# Patient Record
Sex: Female | Born: 1935 | Race: White | Hispanic: No | Smoking: Never smoker
Health system: Southern US, Community
[De-identification: ages and names within clinical notes are randomized; demographics above are authoritative.]

## PROBLEM LIST (undated history)

## (undated) DIAGNOSIS — I2510 Atherosclerotic heart disease of native coronary artery without angina pectoris: Secondary | ICD-10-CM

## (undated) DIAGNOSIS — M1990 Unspecified osteoarthritis, unspecified site: Secondary | ICD-10-CM

## (undated) DIAGNOSIS — I255 Ischemic cardiomyopathy: Secondary | ICD-10-CM

## (undated) DIAGNOSIS — I480 Paroxysmal atrial fibrillation: Secondary | ICD-10-CM

## (undated) DIAGNOSIS — M25519 Pain in unspecified shoulder: Secondary | ICD-10-CM

## (undated) DIAGNOSIS — I5020 Unspecified systolic (congestive) heart failure: Secondary | ICD-10-CM

## (undated) DIAGNOSIS — G8929 Other chronic pain: Secondary | ICD-10-CM

## (undated) DIAGNOSIS — I739 Peripheral vascular disease, unspecified: Secondary | ICD-10-CM

## (undated) DIAGNOSIS — I080 Rheumatic disorders of both mitral and aortic valves: Secondary | ICD-10-CM

## (undated) DIAGNOSIS — E785 Hyperlipidemia, unspecified: Secondary | ICD-10-CM

## (undated) DIAGNOSIS — G629 Polyneuropathy, unspecified: Secondary | ICD-10-CM

## (undated) DIAGNOSIS — S329XXA Fracture of unspecified parts of lumbosacral spine and pelvis, initial encounter for closed fracture: Secondary | ICD-10-CM

## (undated) DIAGNOSIS — I6529 Occlusion and stenosis of unspecified carotid artery: Secondary | ICD-10-CM

## (undated) DIAGNOSIS — I10 Essential (primary) hypertension: Secondary | ICD-10-CM

## (undated) DIAGNOSIS — M549 Dorsalgia, unspecified: Secondary | ICD-10-CM

## (undated) HISTORY — DX: Unspecified systolic (congestive) heart failure: I50.20

## (undated) HISTORY — DX: Peripheral vascular disease, unspecified: I73.9

## (undated) HISTORY — DX: Essential (primary) hypertension: I10

## (undated) HISTORY — DX: Unspecified osteoarthritis, unspecified site: M19.90

## (undated) HISTORY — DX: Occlusion and stenosis of unspecified carotid artery: I65.29

## (undated) HISTORY — PX: ABDOMINAL HYSTERECTOMY: SHX81

## (undated) HISTORY — PX: BREAST BIOPSY: SHX20

## (undated) HISTORY — DX: Other chronic pain: G89.29

## (undated) HISTORY — DX: Rheumatic disorders of both mitral and aortic valves: I08.0

## (undated) HISTORY — DX: Polyneuropathy, unspecified: G62.9

## (undated) HISTORY — DX: Dorsalgia, unspecified: M54.9

## (undated) HISTORY — DX: Atherosclerotic heart disease of native coronary artery without angina pectoris: I25.10

## (undated) HISTORY — DX: Paroxysmal atrial fibrillation: I48.0

## (undated) HISTORY — DX: Hyperlipidemia, unspecified: E78.5

## (undated) HISTORY — PX: OTHER SURGICAL HISTORY: SHX169

## (undated) HISTORY — PX: VESICOVAGINAL FISTULA CLOSURE W/ TAH: SUR271

## (undated) HISTORY — DX: Ischemic cardiomyopathy: I25.5

## (undated) HISTORY — DX: Fracture of unspecified parts of lumbosacral spine and pelvis, initial encounter for closed fracture: S32.9XXA

## (undated) HISTORY — DX: Pain in unspecified shoulder: M25.519

## (undated) SURGERY — Surgical Case
Anesthesia: *Unknown

---

## 1998-03-04 ENCOUNTER — Ambulatory Visit (HOSPITAL_COMMUNITY): Admission: RE | Admit: 1998-03-04 | Discharge: 1998-03-04 | Payer: Self-pay | Admitting: Obstetrics & Gynecology

## 2000-12-18 HISTORY — PX: CORONARY STENT PLACEMENT: SHX1402

## 2002-01-18 LAB — HM MAMMOGRAPHY: HM Mammogram: NORMAL

## 2002-12-18 HISTORY — PX: CORONARY ANGIOPLASTY: SHX604

## 2003-04-29 ENCOUNTER — Encounter: Payer: Self-pay | Admitting: Gastroenterology

## 2004-10-27 ENCOUNTER — Ambulatory Visit: Payer: Self-pay | Admitting: Internal Medicine

## 2004-11-17 HISTORY — PX: NM MYOVIEW LTD: HXRAD82

## 2005-02-24 ENCOUNTER — Ambulatory Visit: Payer: Self-pay | Admitting: Internal Medicine

## 2005-03-07 ENCOUNTER — Ambulatory Visit: Payer: Self-pay | Admitting: Internal Medicine

## 2005-04-11 ENCOUNTER — Ambulatory Visit: Payer: Self-pay | Admitting: Internal Medicine

## 2005-04-27 ENCOUNTER — Ambulatory Visit: Payer: Self-pay | Admitting: Internal Medicine

## 2005-06-06 ENCOUNTER — Ambulatory Visit: Payer: Self-pay | Admitting: Internal Medicine

## 2005-06-29 ENCOUNTER — Ambulatory Visit: Payer: Self-pay | Admitting: Internal Medicine

## 2005-07-26 ENCOUNTER — Ambulatory Visit: Payer: Self-pay | Admitting: Internal Medicine

## 2005-09-17 HISTORY — PX: OTHER SURGICAL HISTORY: SHX169

## 2005-09-24 ENCOUNTER — Other Ambulatory Visit: Payer: Self-pay

## 2005-09-24 ENCOUNTER — Observation Stay: Payer: Self-pay

## 2005-10-05 ENCOUNTER — Ambulatory Visit: Payer: Self-pay | Admitting: Internal Medicine

## 2005-10-26 ENCOUNTER — Ambulatory Visit: Payer: Self-pay | Admitting: Internal Medicine

## 2005-11-10 ENCOUNTER — Ambulatory Visit: Payer: Self-pay | Admitting: Family Medicine

## 2005-11-13 ENCOUNTER — Ambulatory Visit: Payer: Self-pay | Admitting: Internal Medicine

## 2005-12-05 ENCOUNTER — Ambulatory Visit: Payer: Self-pay | Admitting: Internal Medicine

## 2006-01-15 ENCOUNTER — Ambulatory Visit: Payer: Self-pay | Admitting: Internal Medicine

## 2006-01-18 HISTORY — PX: CORONARY ANGIOPLASTY: SHX604

## 2006-01-18 HISTORY — PX: OTHER SURGICAL HISTORY: SHX169

## 2006-01-23 ENCOUNTER — Ambulatory Visit: Payer: Self-pay | Admitting: Internal Medicine

## 2006-01-26 ENCOUNTER — Inpatient Hospital Stay (HOSPITAL_BASED_OUTPATIENT_CLINIC_OR_DEPARTMENT_OTHER): Admission: RE | Admit: 2006-01-26 | Discharge: 2006-01-26 | Payer: Self-pay | Admitting: *Deleted

## 2006-01-26 ENCOUNTER — Ambulatory Visit: Payer: Self-pay | Admitting: *Deleted

## 2006-01-31 ENCOUNTER — Ambulatory Visit: Payer: Self-pay

## 2006-02-12 ENCOUNTER — Ambulatory Visit: Payer: Self-pay | Admitting: Internal Medicine

## 2006-04-05 ENCOUNTER — Ambulatory Visit: Payer: Self-pay | Admitting: Internal Medicine

## 2006-04-09 ENCOUNTER — Ambulatory Visit: Payer: Self-pay | Admitting: Internal Medicine

## 2006-04-16 ENCOUNTER — Ambulatory Visit: Payer: Self-pay | Admitting: Internal Medicine

## 2006-06-14 ENCOUNTER — Ambulatory Visit: Payer: Self-pay | Admitting: Internal Medicine

## 2006-06-21 ENCOUNTER — Ambulatory Visit: Payer: Self-pay | Admitting: Internal Medicine

## 2006-08-14 ENCOUNTER — Ambulatory Visit: Payer: Self-pay | Admitting: Internal Medicine

## 2006-09-17 ENCOUNTER — Ambulatory Visit: Payer: Self-pay | Admitting: Internal Medicine

## 2006-09-17 HISTORY — PX: CATARACT EXTRACTION: SUR2

## 2006-09-18 ENCOUNTER — Ambulatory Visit: Payer: Self-pay | Admitting: Ophthalmology

## 2006-09-24 ENCOUNTER — Ambulatory Visit: Payer: Self-pay | Admitting: Ophthalmology

## 2006-10-09 ENCOUNTER — Ambulatory Visit: Payer: Self-pay | Admitting: Internal Medicine

## 2006-10-18 HISTORY — PX: OTHER SURGICAL HISTORY: SHX169

## 2006-10-23 ENCOUNTER — Ambulatory Visit: Payer: Self-pay | Admitting: Internal Medicine

## 2006-11-12 ENCOUNTER — Encounter: Payer: Self-pay | Admitting: Cardiology

## 2006-11-12 ENCOUNTER — Ambulatory Visit: Payer: Self-pay

## 2006-12-04 ENCOUNTER — Ambulatory Visit: Payer: Self-pay | Admitting: Internal Medicine

## 2007-01-10 ENCOUNTER — Ambulatory Visit: Payer: Self-pay | Admitting: Internal Medicine

## 2007-02-14 ENCOUNTER — Ambulatory Visit: Payer: Self-pay | Admitting: Internal Medicine

## 2007-02-25 ENCOUNTER — Ambulatory Visit: Payer: Self-pay | Admitting: Internal Medicine

## 2007-02-25 LAB — CONVERTED CEMR LAB
ALT: 15 units/L (ref 0–40)
AST: 18 units/L (ref 0–37)
Bilirubin, Direct: 0.1 mg/dL (ref 0.0–0.3)
Chloride: 107 meq/L (ref 96–112)
Cholesterol: 178 mg/dL (ref 0–200)
GFR calc Af Amer: 88 mL/min
GFR calc non Af Amer: 73 mL/min
Phosphorus: 3.7 mg/dL (ref 2.3–4.6)
Potassium: 4.2 meq/L (ref 3.5–5.1)
Sodium: 144 meq/L (ref 135–145)
Total Bilirubin: 0.7 mg/dL (ref 0.3–1.2)
Total CHOL/HDL Ratio: 4.1
Total Protein: 6.5 g/dL (ref 6.0–8.3)

## 2007-03-19 HISTORY — PX: CORONARY ANGIOPLASTY: SHX604

## 2007-04-05 ENCOUNTER — Ambulatory Visit: Payer: Self-pay | Admitting: Internal Medicine

## 2007-04-16 ENCOUNTER — Ambulatory Visit: Payer: Self-pay | Admitting: Internal Medicine

## 2007-04-18 HISTORY — PX: CORONARY STENT PLACEMENT: SHX1402

## 2007-04-22 ENCOUNTER — Ambulatory Visit: Payer: Self-pay | Admitting: Cardiovascular Disease

## 2007-04-22 ENCOUNTER — Inpatient Hospital Stay (HOSPITAL_COMMUNITY): Admission: AD | Admit: 2007-04-22 | Discharge: 2007-04-23 | Payer: Self-pay | Admitting: Cardiovascular Disease

## 2007-04-26 ENCOUNTER — Ambulatory Visit: Payer: Self-pay | Admitting: Internal Medicine

## 2007-05-19 HISTORY — PX: FEMORAL HERNIA REPAIR: SUR1179

## 2007-05-30 ENCOUNTER — Ambulatory Visit: Payer: Self-pay | Admitting: Internal Medicine

## 2007-06-04 DIAGNOSIS — I25119 Atherosclerotic heart disease of native coronary artery with unspecified angina pectoris: Secondary | ICD-10-CM

## 2007-06-04 DIAGNOSIS — I10 Essential (primary) hypertension: Secondary | ICD-10-CM | POA: Insufficient documentation

## 2007-06-04 DIAGNOSIS — I739 Peripheral vascular disease, unspecified: Secondary | ICD-10-CM

## 2007-06-04 DIAGNOSIS — M810 Age-related osteoporosis without current pathological fracture: Secondary | ICD-10-CM

## 2007-06-05 ENCOUNTER — Ambulatory Visit: Payer: Self-pay | Admitting: Internal Medicine

## 2007-06-05 LAB — CONVERTED CEMR LAB
Nitrite: NEGATIVE
Specific Gravity, Urine: <1.005
pH: 5

## 2007-06-06 ENCOUNTER — Other Ambulatory Visit: Payer: Self-pay

## 2007-06-06 ENCOUNTER — Ambulatory Visit: Payer: Self-pay | Admitting: Cardiology

## 2007-06-06 ENCOUNTER — Inpatient Hospital Stay: Payer: Self-pay | Admitting: Vascular Surgery

## 2007-06-07 ENCOUNTER — Telehealth (INDEPENDENT_AMBULATORY_CARE_PROVIDER_SITE_OTHER): Payer: Self-pay | Admitting: Internal Medicine

## 2007-06-07 ENCOUNTER — Other Ambulatory Visit: Payer: Self-pay

## 2007-06-10 ENCOUNTER — Encounter: Payer: Self-pay | Admitting: Internal Medicine

## 2007-06-19 ENCOUNTER — Ambulatory Visit: Payer: Self-pay | Admitting: Internal Medicine

## 2007-06-25 ENCOUNTER — Encounter: Payer: Self-pay | Admitting: Internal Medicine

## 2007-06-26 ENCOUNTER — Ambulatory Visit: Payer: Self-pay | Admitting: Vascular Surgery

## 2007-08-01 ENCOUNTER — Ambulatory Visit: Payer: Self-pay | Admitting: Internal Medicine

## 2007-08-22 ENCOUNTER — Ambulatory Visit: Payer: Self-pay | Admitting: Internal Medicine

## 2007-11-05 ENCOUNTER — Ambulatory Visit: Payer: Self-pay | Admitting: Internal Medicine

## 2007-11-22 ENCOUNTER — Ambulatory Visit: Payer: Self-pay | Admitting: Internal Medicine

## 2007-11-29 ENCOUNTER — Ambulatory Visit: Payer: Self-pay | Admitting: Internal Medicine

## 2007-11-29 LAB — CONVERTED CEMR LAB
BUN: 28 mg/dL — ABNORMAL HIGH (ref 6–23)
Calcium: 8.7 mg/dL (ref 8.4–10.5)
Glucose, Bld: 104 mg/dL — ABNORMAL HIGH (ref 70–99)

## 2007-12-04 ENCOUNTER — Ambulatory Visit: Payer: Self-pay | Admitting: Internal Medicine

## 2007-12-04 DIAGNOSIS — E785 Hyperlipidemia, unspecified: Secondary | ICD-10-CM

## 2007-12-05 LAB — CONVERTED CEMR LAB
AST: 19 units/L (ref 0–37)
BUN: 21 mg/dL (ref 6–23)
Basophils Absolute: 0.1 10*3/uL (ref 0.0–0.1)
Bilirubin, Direct: 0.2 mg/dL (ref 0.0–0.3)
Cholesterol: 133 mg/dL (ref 0–200)
Creatinine, Ser: 0.9 mg/dL (ref 0.4–1.2)
Eosinophils Relative: 2.4 % (ref 0.0–5.0)
GFR calc Af Amer: 77 mL/min
GFR calc non Af Amer: 63 mL/min
HCT: 37.1 % (ref 36.0–46.0)
Hemoglobin: 12.9 g/dL (ref 12.0–15.0)
LDL Cholesterol: 51 mg/dL (ref 0–99)
MCHC: 34.7 g/dL (ref 30.0–36.0)
MCV: 93.3 fL (ref 78.0–100.0)
Monocytes Absolute: 0.9 10*3/uL — ABNORMAL HIGH (ref 0.2–0.7)
Neutrophils Relative %: 65.2 % (ref 43.0–77.0)
Phosphorus: 4.4 mg/dL (ref 2.3–4.6)
Potassium: 4.6 meq/L (ref 3.5–5.1)
RDW: 13 % (ref 11.5–14.6)
Sodium: 145 meq/L (ref 135–145)
TSH: 2.07 microintl units/mL (ref 0.35–5.50)
Total Protein: 7.1 g/dL (ref 6.0–8.3)
WBC: 8.8 10*3/uL (ref 4.5–10.5)

## 2007-12-17 ENCOUNTER — Telehealth (INDEPENDENT_AMBULATORY_CARE_PROVIDER_SITE_OTHER): Payer: Self-pay | Admitting: *Deleted

## 2008-03-20 ENCOUNTER — Ambulatory Visit: Payer: Self-pay | Admitting: Internal Medicine

## 2008-05-18 ENCOUNTER — Ambulatory Visit: Payer: Self-pay | Admitting: Internal Medicine

## 2008-05-19 LAB — CONVERTED CEMR LAB
ALT: 15 units/L (ref 0–35)
Albumin: 3.9 g/dL (ref 3.5–5.2)
BUN: 27 mg/dL — ABNORMAL HIGH (ref 6–23)
Basophils Relative: 0.8 % (ref 0.0–1.0)
Bilirubin, Direct: 0.1 mg/dL (ref 0.0–0.3)
Chloride: 109 meq/L (ref 96–112)
Creatinine, Ser: 1 mg/dL (ref 0.4–1.2)
Eosinophils Absolute: 0.2 10*3/uL (ref 0.0–0.7)
Eosinophils Relative: 2.9 % (ref 0.0–5.0)
GFR calc Af Amer: 68 mL/min
GFR calc non Af Amer: 56 mL/min
HCT: 37.2 % (ref 36.0–46.0)
HDL: 43.3 mg/dL (ref >39.0)
Hemoglobin: 12.8 g/dL (ref 12.0–15.0)
MCV: 93 fL (ref 78.0–100.0)
Monocytes Absolute: 0.5 10*3/uL (ref 0.1–1.0)
Monocytes Relative: 8.4 % (ref 3.0–12.0)
Neutro Abs: 4.2 10*3/uL (ref 1.4–7.7)
Phosphorus: 3.3 mg/dL (ref 2.3–4.6)
Potassium: 4.5 meq/L (ref 3.5–5.1)
RBC: 4 M/uL (ref 3.87–5.11)
Total CHOL/HDL Ratio: 3.8
Total Protein: 7.6 g/dL (ref 6.0–8.3)
WBC: 6.5 10*3/uL (ref 4.5–10.5)

## 2008-07-24 ENCOUNTER — Ambulatory Visit: Payer: Self-pay | Admitting: Internal Medicine

## 2008-07-24 LAB — CONVERTED CEMR LAB
BUN: 29 mg/dL — ABNORMAL HIGH (ref 6–23)
Calcium: 9.2 mg/dL (ref 8.4–10.5)
Glucose, Bld: 101 mg/dL — ABNORMAL HIGH (ref 70–99)
Potassium: 4.1 meq/L (ref 3.5–5.3)
Sodium: 144 meq/L (ref 135–145)
TSH: 2.249 microintl units/mL (ref 0.350–4.50)
Vitamin B-12: 424 pg/mL (ref 211–911)

## 2008-07-29 ENCOUNTER — Ambulatory Visit: Payer: Self-pay

## 2008-07-29 ENCOUNTER — Encounter: Payer: Self-pay | Admitting: Internal Medicine

## 2008-08-27 ENCOUNTER — Ambulatory Visit: Payer: Self-pay | Admitting: Internal Medicine

## 2008-09-15 ENCOUNTER — Telehealth: Payer: Self-pay | Admitting: Internal Medicine

## 2008-11-09 ENCOUNTER — Ambulatory Visit: Payer: Self-pay | Admitting: Internal Medicine

## 2008-11-18 ENCOUNTER — Ambulatory Visit: Payer: Self-pay | Admitting: Internal Medicine

## 2008-11-20 LAB — CONVERTED CEMR LAB
ALT: 17 units/L (ref 0–35)
AST: 18 units/L (ref 0–37)
Albumin: 3.6 g/dL (ref 3.5–5.2)
Basophils Relative: 1 % (ref 0.0–3.0)
Calcium: 9.2 mg/dL (ref 8.4–10.5)
Eosinophils Relative: 2.4 % (ref 0.0–5.0)
GFR calc Af Amer: 68 mL/min
GFR calc non Af Amer: 56 mL/min
Glucose, Bld: 98 mg/dL (ref 70–99)
HCT: 34.9 % — ABNORMAL LOW (ref 36.0–46.0)
Hemoglobin: 11.9 g/dL — ABNORMAL LOW (ref 12.0–15.0)
LDL Cholesterol: 79 mg/dL (ref 0–99)
Lymphocytes Relative: 20.3 % (ref 12.0–46.0)
Monocytes Absolute: 0.6 10*3/uL (ref 0.1–1.0)
Monocytes Relative: 9.5 % (ref 3.0–12.0)
Neutro Abs: 4.4 10*3/uL (ref 1.4–7.7)
Phosphorus: 4.5 mg/dL (ref 2.3–4.6)
RBC: 3.68 M/uL — ABNORMAL LOW (ref 3.87–5.11)
Sodium: 143 meq/L (ref 135–145)
Total CHOL/HDL Ratio: 3.1
Total Protein: 7 g/dL (ref 6.0–8.3)

## 2009-03-18 HISTORY — PX: FRACTURE SURGERY: SHX138

## 2009-03-27 ENCOUNTER — Emergency Department: Payer: Self-pay | Admitting: Emergency Medicine

## 2009-04-08 ENCOUNTER — Encounter: Payer: Self-pay | Admitting: Internal Medicine

## 2009-04-08 ENCOUNTER — Ambulatory Visit: Payer: Self-pay | Admitting: Internal Medicine

## 2009-04-08 ENCOUNTER — Encounter (INDEPENDENT_AMBULATORY_CARE_PROVIDER_SITE_OTHER): Payer: Self-pay | Admitting: *Deleted

## 2009-05-14 ENCOUNTER — Ambulatory Visit: Payer: Self-pay | Admitting: Internal Medicine

## 2009-05-18 LAB — CONVERTED CEMR LAB
AST: 15 units/L (ref 0–37)
Alkaline Phosphatase: 65 units/L (ref 39–117)
BUN: 28 mg/dL — ABNORMAL HIGH (ref 6–23)
Bilirubin, Direct: 0 mg/dL (ref 0.0–0.3)
Chloride: 116 meq/L — ABNORMAL HIGH (ref 96–112)
Cholesterol: 159 mg/dL (ref 0–200)
Creatinine, Ser: 0.9 mg/dL (ref 0.4–1.2)
Eosinophils Absolute: 0.2 10*3/uL (ref 0.0–0.7)
LDL Cholesterol: 81 mg/dL (ref 0–99)
MCHC: 33.6 g/dL (ref 30.0–36.0)
MCV: 94.5 fL (ref 78.0–100.0)
Monocytes Absolute: 0.6 10*3/uL (ref 0.1–1.0)
Neutrophils Relative %: 63 % (ref 43.0–77.0)
Platelets: 167 10*3/uL (ref 150.0–400.0)
TSH: 1.33 microintl units/mL (ref 0.35–5.50)
Total Protein: 6.9 g/dL (ref 6.0–8.3)
WBC: 6.4 10*3/uL (ref 4.5–10.5)

## 2009-06-09 ENCOUNTER — Telehealth: Payer: Self-pay | Admitting: Internal Medicine

## 2009-07-27 ENCOUNTER — Telehealth: Payer: Self-pay | Admitting: Internal Medicine

## 2009-09-15 ENCOUNTER — Ambulatory Visit: Payer: Self-pay | Admitting: Internal Medicine

## 2009-09-20 ENCOUNTER — Ambulatory Visit: Payer: Self-pay | Admitting: Internal Medicine

## 2009-11-10 ENCOUNTER — Telehealth: Payer: Self-pay | Admitting: Family Medicine

## 2009-11-10 ENCOUNTER — Telehealth: Payer: Self-pay | Admitting: Internal Medicine

## 2009-12-03 ENCOUNTER — Telehealth: Payer: Self-pay | Admitting: Internal Medicine

## 2010-01-28 ENCOUNTER — Ambulatory Visit: Payer: Self-pay | Admitting: Internal Medicine

## 2010-01-31 LAB — CONVERTED CEMR LAB
ALT: 15 U/L
AST: 18 U/L
Albumin: 4 g/dL
Alkaline Phosphatase: 68 U/L
BUN: 30 mg/dL — ABNORMAL HIGH
Basophils Absolute: 0.1 K/uL
Basophils Relative: 0.9 %
Bilirubin, Direct: 0 mg/dL
CO2: 31 meq/L
Calcium: 10 mg/dL
Chloride: 107 meq/L
Cholesterol: 166 mg/dL
Creatinine, Ser: 0.8 mg/dL
Direct LDL: 77.8 mg/dL
Eosinophils Absolute: 0.1 K/uL
Eosinophils Relative: 2.1 %
GFR calc non Af Amer: 71.96 mL/min
Glucose, Bld: 81 mg/dL
HCT: 39.4 %
HDL: 56.8 mg/dL
Hemoglobin: 12.9 g/dL
Lymphocytes Relative: 25 %
Lymphs Abs: 1.5 K/uL
MCHC: 32.6 g/dL
MCV: 96.2 fL
Monocytes Absolute: 0.6 K/uL
Monocytes Relative: 9.8 %
Neutro Abs: 3.7 K/uL
Neutrophils Relative %: 62.2 %
Phosphorus: 3.6 mg/dL
Platelets: 182 K/uL
Potassium: 4.4 meq/L
RBC: 4.1 M/uL
RDW: 13.1 %
Sodium: 143 meq/L
TSH: 1.82 u[IU]/mL
Total Bilirubin: 0.3 mg/dL
Total CHOL/HDL Ratio: 3
Total Protein: 7.1 g/dL
Triglycerides: 208 mg/dL — ABNORMAL HIGH
VLDL: 41.6 mg/dL — ABNORMAL HIGH
WBC: 6 10*3/microliter

## 2010-02-11 ENCOUNTER — Telehealth: Payer: Self-pay | Admitting: Internal Medicine

## 2010-03-29 ENCOUNTER — Ambulatory Visit: Payer: Self-pay | Admitting: Internal Medicine

## 2010-04-05 ENCOUNTER — Telehealth: Payer: Self-pay | Admitting: Internal Medicine

## 2010-06-08 ENCOUNTER — Telehealth: Payer: Self-pay | Admitting: Internal Medicine

## 2010-08-15 ENCOUNTER — Ambulatory Visit: Payer: Self-pay | Admitting: Internal Medicine

## 2010-08-17 LAB — CONVERTED CEMR LAB
Albumin: 4.2 g/dL (ref 3.5–5.2)
Alkaline Phosphatase: 66 units/L (ref 39–117)
Basophils Relative: 0.7 % (ref 0.0–3.0)
CO2: 22 meq/L (ref 19–32)
Calcium: 9.7 mg/dL (ref 8.4–10.5)
Chloride: 108 meq/L (ref 96–112)
Cholesterol: 188 mg/dL (ref 0–200)
Eosinophils Relative: 2.4 % (ref 0.0–5.0)
HCT: 37.4 % (ref 36.0–46.0)
Hemoglobin: 12.7 g/dL (ref 12.0–15.0)
Lymphs Abs: 1.6 10*3/uL (ref 0.7–4.0)
MCHC: 34.1 g/dL (ref 30.0–36.0)
MCV: 94.7 fL (ref 78.0–100.0)
Monocytes Absolute: 0.7 10*3/uL (ref 0.1–1.0)
Neutro Abs: 5.1 10*3/uL (ref 1.4–7.7)
RBC: 3.95 M/uL (ref 3.87–5.11)
Sodium: 141 meq/L (ref 135–145)
Total Bilirubin: 0.6 mg/dL (ref 0.3–1.2)
Total CHOL/HDL Ratio: 4
Triglycerides: 452 mg/dL — ABNORMAL HIGH (ref 0.0–149.0)
VLDL: 90.4 mg/dL — ABNORMAL HIGH (ref 0.0–40.0)
WBC: 7.7 10*3/uL (ref 4.5–10.5)

## 2010-08-18 ENCOUNTER — Telehealth: Payer: Self-pay | Admitting: Internal Medicine

## 2010-09-15 ENCOUNTER — Ambulatory Visit: Payer: Self-pay | Admitting: Internal Medicine

## 2010-10-07 ENCOUNTER — Ambulatory Visit: Payer: Self-pay | Admitting: Internal Medicine

## 2011-01-15 LAB — CONVERTED CEMR LAB
BUN: 22 mg/dL (ref 6–23)
CO2: 22 meq/L (ref 19–32)
Creatinine, Ser: 0.82 mg/dL (ref 0.40–1.20)
Glucose, Bld: 97 mg/dL (ref 70–99)
Sodium: 143 meq/L (ref 135–145)
Total Bilirubin: 0.5 mg/dL (ref 0.3–1.2)
Total Protein: 7.1 g/dL (ref 6.0–8.3)

## 2011-01-19 NOTE — Progress Notes (Signed)
Summary: Crestor or Calcium?  Phone Note Call from Patient Call back at [phone]   Caller: daughter Rubbie Battiest Call For: Cindee Salt MD Summary of Call: pt is confused if she should be taking Crestor and calcium both together? Pt seen the name (Rosuvastatin calcium) along with Crestor on her med list and told her daughter she doesn't need the Calcium because Crestor has calcium in it? Please advise. Initial call taken by: Mervin Hack CMA Duncan Dull),  August 18, 2010 9:56 AM  Follow-up for Phone Call        calcium is different than rosuvastatin. That doesn't provide the calcium Calcium can affect the absorption of other meds. If she eats cheese, yougurt or milk --probably best not to take calcium supplements.  Should be taking 1000 international units of vitamin D daily though Follow-up by: Cindee Salt MD,  August 18, 2010 10:49 AM  Additional Follow-up for Phone Call Additional follow up Details #1::        daughter cell phone voicemail not set up, called work number and spoke with daughter and advised results. Additional Follow-up by: Mervin Hack CMA (AAMA),  August 18, 2010 1:25 PM

## 2011-01-19 NOTE — Assessment & Plan Note (Signed)
Summary: W1X   Visit Type:  Follow-up Primary Provider:  Cindee Salt MD  CC:  no complaints.  History of Present Illness: Christina [name] is a delightful 77 year old woman with a history of coronary artery disease status post previous myocardial infarction and stenting of the ramus branch and the right coronary artery with drug-eluting stent in May 2008.  She also has a history of hyperlipidemia withprevious  intolerance to ALL STATINS, hypertension, peptic ulcer disease with remote GI bleeding, as well as chronic shoulder and back pain.  She is here for routine f/u.  Still having a hard time with leg pain and neuropathy. Toes feel like they are numb. However still able to get out in yard and work on her garden.  No significant dyspnea. Very rare CP. Bought a house in [place] but decided it wasn't for her and came back after a week. Has mild swelling L > R. No orthopnea or PND. Has been tolerating low dose crestor. Bruises easily and sometime severe.  Recent lipids TC 166 TG 208 HDL 57 LDL 78  Current Medications (verified): 1)  Plavix 75 Mg Tabs (Clopidogrel Bisulfate) .... Take 1 Tablet By Mouth Once A Day 2)  Lisinopril 20 Mg Tabs (Lisinopril) .... Take 1 Tablet By Mouth Once A Day 3)  Adult Aspirin Ec Low Strength 81 Mg  Tbec (Aspirin) .... Take 1 Tablet By Mouth Once A Day 4)  Coreg 6.25 Mg  Tabs (Carvedilol) .... Take 1 Tablet By Mouth Two Times A Day 5)  Amlodipine Besylate 10 Mg  Tabs (Amlodipine Besylate) .... Take 1 Tablet By Mouth Once A Day 6)  Lasix 20 Mg  Tabs (Furosemide) .Marland Kitchen.. 1 Every Other Day Alternating  2 Every Other Day 7)  Crestor 5 Mg  Tabs (Rosuvastatin Calcium) .... Take 1 Tablet By Mouth Once A Day 8)  Nasonex 50 Mcg/act Susp (Mometasone Furoate) .... 2 Sprays Each Nostril Daily As Needed 9)  Gabapentin 300 Mg Caps (Gabapentin) .... Take 3 Tabs At Bedtime For Leg Nerve Pain 10)  Potassium Chloride Crys Cr 20 Meq Cr-Tabs (Potassium Chloride Crys Cr) .... Take One By Mouth  Daily 11)  Fish Oil  Oil (Fish Oil) .... Take 1 By Mouth Once Daily 12)  Multivitamins  Caps (Multiple Vitamin) .... Take 1 By Mouth Once Daily  Allergies (verified): 1)  ! Lopid 2)  ! Sulfasalazine 3)  ! Niaspan 4)  ! Cartia Xt 5)  Lipitor  Past History:  Past Medical History: Last updated: 01/28/2010 Coronary artery disease--------------------------------Dr Bensihmon    --status post previous myocardial infarction     --stenting of the ramus branch and RCAA with drug-eluting stent in May 2008.     --Myoview 8/09:  EF 78%. normal perfusion Hypertension Osteoporosis Peripheral vascular disease Hyperlipidemia - intolerance of many  statins Chronic shoulder and back pain Carotid u/s   --11/07: 0-39% bilaterally Osteoarthritis  Review of Systems       As per HPI and past medical history; otherwise all systems negative.   Vital Signs:  Patient profile:   77 year old female Height:      61 inches Weight:      128.50 pounds Pulse rate:   55 / minute Pulse rhythm:   regular BP sitting:   122 / 62  (left arm) Cuff size:   regular  Vitals Entered By: Charlena Cross, RN, BSN (March 29, 2010 10:56 AM)  Physical Exam  General:  Gen: well appearing. no resp difficulty HEENT: normal Neck:  supple. no JVD. Carotids 2+ bilat; no bruits.  Cor: PMI nondisplaced. Regular rate & rhythm. No rubs, gallops, soft systolic murmur at RSB. S2 ok. Lungs: clear Abdomen: soft, nontender, nondistended. No hepatosplenomegaly. No bruits or masses. Good bowel sounds. Extremities: no cyanosis, clubbing, rash, no edema. Multiple ecchyoses. Neuro: alert & orientedx3, cranial nerves grossly intact. moves all 4 extremities w/o difficulty. affect pleasant   Impression & Recommendations:  Problem # 1:  CORONARY ARTERY DISEASE (ICD-414.00) Stable. No evidence of ischemia. Given severe bruising and fact that she is 3 years out from stents will stop Plavix.   Problem # 2:  HYPERLIPIDEMIA  (ICD-272.4) Tolerating Crestor very well. LDL almost at goal. TGs up slightly. Double fish oil 2g/day  Patient Instructions: 1)  Your physician recommends that you schedule a follow-up appointment in: 6 months 2)  Your physician has recommended you make the following change in your medication: stop plavix, increase fish oil to 2000 mg daily  Appended Document: F6M ECG: Sinus brady 55. With trivial ST scooping.

## 2011-01-19 NOTE — Assessment & Plan Note (Signed)
Summary: 6 MTH FU/CLE   Vital Signs:  Patient profile:   77 year old female Weight:      132 pounds BMI:     25.03 Temp:     97.8 degrees F oral Pulse rate:   60 / minute Pulse rhythm:   regular BP sitting:   138 / 60  (left arm) Cuff size:   regular  Vitals Entered By: Mervin Hack CMA Duncan Dull) (August 15, 2010 4:24 PM) CC: 6 month follow-up   History of Present Illness: DOing okay Having some pain across mid right back Not gardening due to lack of rain takes tylenol--this helps  Has had stress with problems with church friends decided not to stay in [place] stress with grandson also  No heart  trouble no chest pain No SOB Very slight edema     Allergies: 1)  ! Lopid 2)  ! Sulfasalazine 3)  ! Niaspan 4)  ! Cartia Xt 5)  Lipitor  Past History:  Past medical, surgical, family and social histories (including risk factors) reviewed for relevance to current acute and chronic problems.  Past Medical History: Reviewed history from 01/28/2010 and no changes required. Coronary artery disease--------------------------------Dr Bensihmon    --status post previous myocardial infarction     --stenting of the ramus branch and RCAA with drug-eluting stent in May 2008.     --Myoview 8/09:  EF 78%. normal perfusion Hypertension Osteoporosis Peripheral vascular disease Hyperlipidemia - intolerance of many  statins Chronic shoulder and back pain Carotid u/s   --11/07: 0-39% bilaterally Osteoarthritis  Past Surgical History: Reviewed history from 05/14/2009 and no changes required. Hysterectomy MI / Stents  [facility] 2002 Cath --medical Rx only 12/2002 Bilat breast biopsies Myoview stress neg-- NL EF  11/2004 CP admit--stresss myoview negative. 09/2005 Cath-LAD/PAD Disease 01/2006 Adenosine myoview--no ischemia  EF70%  01/2006 R Cataract 09/2006 Cath--3 vessel disease  03/2007 Stents  RCA/LCX Multiple  04/2007 [facility] Seltzer) 6/08 Incarcerated R femoral  hernia 4/10  fractured  left leg  Family History: Reviewed history from 09/23/2009 and no changes required. Father: deceased 98; heart disease Mother: deceased 35; PE Sister: deceased 45; heart disease Sister: deceased 65; heart disease Brother: deceased [place] Brother: decesaed; ?  Social History: Reviewed history from 06/04/2007 and no changes required. Widowed 2005 after long time care for husband after stroke Remarried  has 3 children Never Smoked Alcohol use-no  Review of Systems       eating well weight up a few pounds chronic sleep issues---worries about "mess at church now"  Physical Exam  General:  alert and normal appearance.   Neck:  supple, no masses, no thyromegaly, and no cervical lymphadenopathy.   Chest Wall:  tender along posterior right ribs  Lungs:  normal respiratory effort, no intercostal retractions, no accessory muscle use, and normal breath sounds.   Heart:  normal rate, regular rhythm, no murmur, and no gallop.   Abdomen:  soft and non-tender.   Msk:  no spine tenderness no joint swelling.   Extremities:  trace pedal edema Psych:  normally interactive, good eye contact, and not anxious appearing.     Impression & Recommendations:  Problem # 1:  BACK PAIN (ICD-724.5) Assessment Comment Only seems to be muscular or in rib tylenol helps  Problem # 2:  HYPERTENSION (ICD-401.9) Assessment: Unchanged  good control no changes needed  Her updated medication list for this problem includes:    Lisinopril 20 Mg Tabs (Lisinopril) .Marland Kitchen... Take 1 tablet by mouth once a day  Coreg 6.25 Mg Tabs (Carvedilol) .Marland Kitchen... Take 1 tablet by mouth two times a day    Amlodipine Besylate 10 Mg Tabs (Amlodipine besylate) .Marland Kitchen... Take 1 tablet by mouth once a day    Lasix 20 Mg Tabs (Furosemide) .Marland Kitchen... 1 every other day alternating  2 every other day  BP today: 138/60 Prior BP: 122/62 (03/29/2010)  Labs Reviewed: K+: 4.4 (01/28/2010) Creat: : 0.8 (01/28/2010)   Chol: 166 (01/28/2010)   HDL:  56.80 (01/28/2010)   LDL: 81 (05/14/2009)   TG: 208.0 (01/28/2010)  Orders: TLB-Renal Function Panel (80069-RENAL) TLB-CBC Platelet - w/Differential (85025-CBCD) TLB-TSH (Thyroid Stimulating Hormone) (84443-TSH)  Problem # 3:  CORONARY ARTERY DISEASE (ICD-414.00) Assessment: Unchanged stable no recent angina  Her updated medication list for this problem includes:    Lisinopril 20 Mg Tabs (Lisinopril) .Marland Kitchen... Take 1 tablet by mouth once a day    Coreg 6.25 Mg Tabs (Carvedilol) .Marland Kitchen... Take 1 tablet by mouth two times a day    Amlodipine Besylate 10 Mg Tabs (Amlodipine besylate) .Marland Kitchen... Take 1 tablet by mouth once a day    Lasix 20 Mg Tabs (Furosemide) .Marland Kitchen... 1 every other day alternating  2 every other day    Adult Aspirin Ec Low Strength 81 Mg Tbec (Aspirin) .Marland Kitchen... Take 1 tablet by mouth once a day  Problem # 4:  OSTEOPOROSIS (ICD-733.00) Assessment: Comment Only on calcium and vitamin D  Problem # 5:  HYPERLIPIDEMIA (ICD-272.4) Assessment: Unchanged  tolerating crestor  Her updated medication list for this problem includes:    Crestor 5 Mg Tabs (Rosuvastatin calcium) .Marland Kitchen... Take 1 tablet by mouth once a day  Labs Reviewed: SGOT: 18 (01/28/2010)   SGPT: 15 (01/28/2010)   HDL:56.80 (01/28/2010), 46.80 (05/14/2009)  LDL:81 (05/14/2009), 79 (11/18/2008)  Chol:166 (01/28/2010), 159 (05/14/2009)  Trig:208.0 (01/28/2010), 156.0 (05/14/2009)  Orders: TLB-Lipid Panel (80061-LIPID) TLB-Hepatic/Liver Function Pnl (80076-HEPATIC) Venipuncture (16109)  Complete Medication List: 1)  Lisinopril 20 Mg Tabs (Lisinopril) .... Take 1 tablet by mouth once a day 2)  Coreg 6.25 Mg Tabs (Carvedilol) .... Take 1 tablet by mouth two times a day 3)  Amlodipine Besylate 10 Mg Tabs (Amlodipine besylate) .... Take 1 tablet by mouth once a day 4)  Lasix 20 Mg Tabs (Furosemide) .Marland Kitchen.. 1 every other day alternating  2 every other day 5)  Crestor 5 Mg Tabs (Rosuvastatin calcium) .... Take 1 tablet by mouth once a  day 6)  Nasonex 50 Mcg/act Susp (Mometasone furoate) .... 2 sprays each nostril daily as needed 7)  Gabapentin 300 Mg Caps (Gabapentin) .... Take 3 tabs at bedtime for leg nerve pain 8)  Potassium Chloride Crys Cr 20 Meq Cr-tabs (Potassium chloride crys cr) .... Take one by mouth daily 9)  Fish Oil Oil (Fish oil) .... Take 2 by mouth once daily 10)  Multivitamins Caps (Multiple vitamin) .... Take 1 by mouth once daily 11)  Adult Aspirin Ec Low Strength 81 Mg Tbec (Aspirin) .... Take 1 tablet by mouth once a day  Patient Instructions: 1)  Please schedule a follow-up appointment in 6 months .   Current Allergies (reviewed today): ! LOPID ! SULFASALAZINE ! NIASPAN ! CARTIA XT LIPITOR

## 2011-01-19 NOTE — Assessment & Plan Note (Signed)
Summary: F6M/AMD   Visit Type:  Follow-up Primary Provider:  Cindee Salt MD  CC:  c/o shortness of breath with walking up an incline more so than in the past. .  History of Present Illness: Christina [name] is a delightful 77 year old woman with a history of coronary artery disease status post previous myocardial infarction and stenting of the ramus branch and the right coronary artery with drug-eluting stent in May 2008.  She also has a history of hyperlipidemia with previous intolerance to ALL STATINS (put now tolerating low dose Crestor), hypertension, peptic ulcer disease with remote GI bleeding, as well as chronic shoulder and back pain.  She is here for routine f/u.  Doing farily well. Still having a hard time with leg pain and neuropathy limits her walking. No CP. Still with chronic exertional dyspnea. Occasional mild dyspnea.   Last lipids in 8/11. TC 188 TG 452 HDL 43 LDL not calculated. Did not have any changes to meds (says she was told everything was normal)  Current Medications (verified): 1)  Lisinopril 20 Mg Tabs (Lisinopril) .... Take 1 Tablet By Mouth Once A Day 2)  Coreg 6.25 Mg  Tabs (Carvedilol) .... Take 1 Tablet By Mouth Two Times A Day 3)  Amlodipine Besylate 10 Mg  Tabs (Amlodipine Besylate) .... Take 1 Tablet By Mouth Once A Day 4)  Lasix 20 Mg  Tabs (Furosemide) .Marland Kitchen.. 1 Every Other Day Alternating  2 Every Other Day 5)  Crestor 5 Mg  Tabs (Rosuvastatin Calcium) .... Take 1 Tablet By Mouth Once A Day 6)  Nasonex 50 Mcg/act Susp (Mometasone Furoate) .... 2 Sprays Each Nostril Daily As Needed 7)  Gabapentin 300 Mg Caps (Gabapentin) .... Take 3 Tabs At Bedtime For Leg Nerve Pain 8)  Potassium Chloride Crys Cr 20 Meq Cr-Tabs (Potassium Chloride Crys Cr) .... Take One By Mouth Daily 9)  Fish Oil  Oil (Fish Oil) .... Take 2 By Mouth Once Daily 10)  Multivitamins  Caps (Multiple Vitamin) .... Take 1 By Mouth Once Daily 11)  Adult Aspirin Ec Low Strength 81 Mg  Tbec (Aspirin)  .... Take 1 Tablet By Mouth Once A Day  Allergies (verified): 1)  ! Lopid 2)  ! Sulfasalazine 3)  ! Niaspan 4)  ! Cartia Xt 5)  Lipitor  Past History:  Past Medical History: Last updated: 01/28/2010 Coronary artery disease--------------------------------Dr Bensihmon    --status post previous myocardial infarction     --stenting of the ramus branch and RCAA with drug-eluting stent in May 2008.     --Myoview 8/09:  EF 78%. normal perfusion Hypertension Osteoporosis Peripheral vascular disease Hyperlipidemia - intolerance of many  statins Chronic shoulder and back pain Carotid u/s   --11/07: 0-39% bilaterally Osteoarthritis  Review of Systems       As per HPI and past medical history; otherwise all systems negative.   Vital Signs:  Patient profile:   77 year old female Height:      61 inches Weight:      130 pounds BMI:     24.65 Pulse rate:   54 / minute BP sitting:   140 / 70  (left arm)  Vitals Entered By: Bishop Dublin, CMA (October 07, 2010 10:16 AM)  Physical Exam  General:    well appearing. no resp difficulty HEENT: normal Neck: supple. no JVD. Carotids 2+ bilat; no bruits.  Cor: PMI nondisplaced. Regular rate & rhythm. No rubs, gallops, soft systolic murmur at RSB. S2 ok. Lungs: clear Abdomen: soft, nontender,  nondistended. No hepatosplenomegaly. No bruits or masses. Good bowel sounds. Extremities: no cyanosis, clubbing, rash, no edema. Multiple ecchyoses. Neuro: alert & orientedx3, cranial nerves grossly intact. moves all 4 extremities w/o difficulty. affect pleasant    Impression & Recommendations:  Problem # 1:  CORONARY ARTERY DISEASE (ICD-414.00) Stable. No evidence of ischemia. Continue current regimen.  Problem # 2:  HYPERLIPIDEMIA (ICD-272.4) Previous LDLs looked good. Triglycerides up significantly in August - not clear that this was fasting. Will recheck today. If TGs > 250 can increase fish oil to 2-3 tabs/day.  Other  Orders: T-Comprehensive Metabolic Panel [id number]) T-Hepatic Function [phone])

## 2011-01-19 NOTE — Progress Notes (Signed)
Summary: Samples  Phone Note Refill Request Call back at Home Phone [phone] Message from:  Patient on February 11, 2010 12:21 PM  Refills Requested: Medication #1:  PLAVIX 75 MG TABS Take 1 tablet by mouth once a day  Medication #2:  CRESTOR 5 MG  TABS Take 1 tablet by mouth once a day Pt is out and would like to come by and pick up some samples  Initial call taken by: [place],  February 11, 2010 12:21 PM Caller: Patient Call For: dR.  Follow-up for Phone Call        samples at the front [facility] :) Follow-up by: Mercer Pod,  February 11, 2010 2:34 PM

## 2011-01-19 NOTE — Progress Notes (Signed)
Summary: PROBLEMS  Phone Note Call from Patient Call back at Home Phone [phone]   Caller: SELF Call For: Linden Mikes Summary of Call: HURTING IN THE CENTER OF HER CHEST-FATIGUE-WANTS TO SLEEP ALL OF THE TIME-WAS TOLD BY [facility] [place] TO CALL HERE Initial call taken by: Harlon Flor,  June 08, 2010 3:16 PM  Follow-up for Phone Call        Pt reported that she had CP yesterday and some today.  Took nitro yesterday improved CP, today CP went away by it's self.  Pt informed that Dr. Teressa Lower is not in [place] until August but that I could make her an appointment with Dr. Mariah Milling.  Pt declined the appointment with Dr. Mariah Milling, gave her the phone number of [facility] and told her that if she keeps having CP to go to the ER.  Follow-up by: Benedict Needy, RN,  June 08, 2010 4:12 PM

## 2011-01-19 NOTE — Progress Notes (Signed)
Summary: regarding gabapentin  Phone Note Call from Patient Call back at Home Phone [phone]   Caller: Patient Call For: Cindee Salt MD Summary of Call: Pt wants to know why she was only given one month refill on gabapentin at last refill when she got 3 months on everything else.  I advised her that it could be because gabapentin is a pain medicine or it could be because it was originally prescribed by her cardiologist.  Please advise on what I should tell her.  She is angry about this because she will be going to [place] soon and wont have refills. Initial call taken by: Lowella Petties CMA,  April 05, 2010 3:12 PM  Follow-up for Phone Call        Looks like it was just an error Given #90 instead of #270 Please redo for #270 x 3 Follow-up by: Cindee Salt MD,  April 06, 2010 8:10 AM  Additional Follow-up for Phone Call Additional follow up Details #1::        LMOM to call .            Lowella Petties CMA  April 06, 2010 4:24 PM  Advised pt, med called to [facility] [address]. Additional Follow-up by: Lowella Petties CMA,  April 07, 2010 4:15 PM    Prescriptions: GABAPENTIN 300 MG CAPS (GABAPENTIN) Take 3 tabs at bedtime for leg nerve pain  #270 x 3   Entered by:   Lowella Petties CMA   Authorized by:   Cindee Salt MD   Signed by:   Lowella Petties CMA on 04/07/2010   Method used:   Electronically to        [facility]  [address] (retail)       [address], [address]       [place], [place]  [address]       Ph: [phone]       Fax: [phone]   RxID:   [id number]

## 2011-01-19 NOTE — Assessment & Plan Note (Signed)
Summary: ROA  CYD   Vital Signs:  Patient profile:   77 year old female Weight:      128 pounds Temp:     97.8 degrees F oral Pulse rate:   60 / minute Pulse rhythm:   regular BP sitting:   128 / 60  (left arm) Cuff size:   regular  Vitals Entered By: Mervin Hack CMA Duncan Dull) (January 28, 2010 10:27 AM) CC: follow-up visit   History of Present Illness: Doing well No further headache problems  Going to [place] in March Buying a property on short sale Plans to winter down there  Still keeps up with some gardening Daughter has asked her to stop  Occ angina Uses NTG---usually 2 will manage generally with exertion Breathing okay  Sleeps flat No PND No edema now--does have some swelling in the hot weather  Leg pain controlled fairly well with gabapentin still with some pain when she is up on them (and also at night)  Mild arthrites pain in hands Moderate disability --has fractured both wrists in past No meds trouble straightening some fingers  Allergies: 1)  ! Lopid 2)  ! Sulfasalazine 3)  ! Niaspan 4)  ! Cartia Xt 5)  Lipitor  Past History:  Past medical, surgical, family and social histories (including risk factors) reviewed for relevance to current acute and chronic problems.  Past Medical History: Coronary artery disease--------------------------------Dr Bensihmon    --status post previous myocardial infarction     --stenting of the ramus branch and RCAA with drug-eluting stent in May 2008.     --Myoview 8/09:  EF 78%. normal perfusion Hypertension Osteoporosis Peripheral vascular disease Hyperlipidemia - intolerance of many  statins Chronic shoulder and back pain Carotid u/s   --11/07: 0-39% bilaterally Osteoarthritis  Past Surgical History: Reviewed history from 05/14/2009 and no changes required. Hysterectomy MI / Stents  [facility] 2002 Cath --medical Rx only 12/2002 Bilat breast biopsies Myoview stress neg-- NL EF  11/2004 CP admit--stresss  myoview negative. 09/2005 Cath-LAD/PAD Disease 01/2006 Adenosine myoview--no ischemia  EF70%  01/2006 R Cataract 09/2006 Cath--3 vessel disease  03/2007 Stents  RCA/LCX Multiple  04/2007 Excell Seltzer) 6/08 Incarcerated R femoral  hernia 4/10  fractured left leg  Family History: Reviewed history from 09/23/2009 and no changes required. Father: deceased 33; heart disease Mother: deceased 69; PE Sister: deceased 90; heart disease Sister: deceased 81; heart disease Brother: deceased [place] Brother: decesaed; ?  Social History: Reviewed history from 06/04/2007 and no changes required. Widowed 2005 after long time care for husband after stroke Remarried  has 3 children Never Smoked Alcohol use-no  Review of Systems       Appetite is food weight is up 5#--she watches diet because she wants to eat all the time (she relates to nerves) Stress with son (back in detox) and SIL (legal issues) sleeps okay  Physical Exam  General:  alert and normal appearance.   Neck:  supple, no masses, no thyromegaly, no carotid bruits, and no cervical lymphadenopathy.   Lungs:  normal respiratory effort and normal breath sounds.   Heart:  normal rate, regular rhythm, no murmur, and no gallop.   Msk:  no joint tenderness.   Mild thickening in DIP and PIP in hands Extremities:  no edema Neurologic:  alert & oriented X3, strength normal in all extremities, and gait normal.   Psych:  normally interactive, good eye contact, and not anxious appearing.     Impression & Recommendations:  Problem # 1:  CORONARY ARTERY  DISEASE (ICD-414.00) Assessment Unchanged still with stable angina pattern no changes needed  Her updated medication list for this problem includes:    Plavix 75 Mg Tabs (Clopidogrel bisulfate) .Marland Kitchen... Take 1 tablet by mouth once a day    Lisinopril 20 Mg Tabs (Lisinopril) .Marland Kitchen... Take 1 tablet by mouth once a day    Adult Aspirin Ec Low Strength 81 Mg Tbec (Aspirin) .Marland Kitchen... Take 1 tablet by mouth once  a day    Coreg 6.25 Mg Tabs (Carvedilol) .Marland Kitchen... Take 1 tablet by mouth two times a day    Amlodipine Besylate 10 Mg Tabs (Amlodipine besylate) .Marland Kitchen... Take 1 tablet by mouth once a day    Lasix 20 Mg Tabs (Furosemide) .Marland Kitchen... 1 every other day alternating  2 every other day  Problem # 2:  HYPERLIPIDEMIA (ICD-272.4) Assessment: Unchanged  due for labs has tolerated the crestor  Her updated medication list for this problem includes:    Crestor 5 Mg Tabs (Rosuvastatin calcium) .Marland Kitchen... Take 1 tablet by mouth once a day  Labs Reviewed: SGOT: 15 (05/14/2009)   SGPT: 12 (05/14/2009)   HDL:46.80 (05/14/2009), 52.2 (11/18/2008)  LDL:81 (05/14/2009), 79 (11/18/2008)  Chol:159 (05/14/2009), 164 (11/18/2008)  Trig:156.0 (05/14/2009), 164 (11/18/2008)  Orders: TLB-Lipid Panel (80061-LIPID) TLB-Hepatic/Liver Function Pnl (80076-HEPATIC)  Problem # 3:  HYPERTENSION (ICD-401.9) Assessment: Unchanged  good control no changes needed  Her updated medication list for this problem includes:    Lisinopril 20 Mg Tabs (Lisinopril) .Marland Kitchen... Take 1 tablet by mouth once a day    Coreg 6.25 Mg Tabs (Carvedilol) .Marland Kitchen... Take 1 tablet by mouth two times a day    Amlodipine Besylate 10 Mg Tabs (Amlodipine besylate) .Marland Kitchen... Take 1 tablet by mouth once a day    Lasix 20 Mg Tabs (Furosemide) .Marland Kitchen... 1 every other day alternating  2 every other day  BP today: 128/60 Prior BP: 118/64 (09/20/2009)  Labs Reviewed: K+: 4.4 (05/14/2009) Creat: : 0.9 (05/14/2009)   Chol: 159 (05/14/2009)   HDL: 46.80 (05/14/2009)   LDL: 81 (05/14/2009)   TG: 156.0 (05/14/2009)  Orders: TLB-Renal Function Panel (80069-RENAL) TLB-CBC Platelet - w/Differential (85025-CBCD) TLB-TSH (Thyroid Stimulating Hormone) (84443-TSH) Venipuncture (57322)  Problem # 4:  OSTEOARTHRITIS (ICD-715.90) Assessment: Unchanged more disability than pain in hands  Problem # 5:  NEUROPATHY (ICD-355.9) Assessment: Unchanged persists but fair control with  gabapentin  Problem # 6:  ANXIETY, SITUATIONAL (ICD-308.3) Assessment: Comment Only ongoing but okay emotionally  Complete Medication List: 1)  Plavix 75 Mg Tabs (Clopidogrel bisulfate) .... Take 1 tablet by mouth once a day 2)  Lisinopril 20 Mg Tabs (Lisinopril) .... Take 1 tablet by mouth once a day 3)  Adult Aspirin Ec Low Strength 81 Mg Tbec (Aspirin) .... Take 1 tablet by mouth once a day 4)  Coreg 6.25 Mg Tabs (Carvedilol) .... Take 1 tablet by mouth two times a day 5)  Amlodipine Besylate 10 Mg Tabs (Amlodipine besylate) .... Take 1 tablet by mouth once a day 6)  Lasix 20 Mg Tabs (Furosemide) .Marland Kitchen.. 1 every other day alternating  2 every other day 7)  Crestor 5 Mg Tabs (Rosuvastatin calcium) .... Take 1 tablet by mouth once a day 8)  Nasonex 50 Mcg/act Susp (Mometasone furoate) .... 2 sprays each nostril daily as needed 9)  Gabapentin 300 Mg Caps (Gabapentin) .... Take 3 tabs at bedtime for leg nerve pain 10)  Potassium Chloride Crys Cr 20 Meq Cr-tabs (Potassium chloride crys cr) .... Take one by mouth daily 11)  Fish  Oil Oil (Fish oil) .... Take 1 by mouth once daily 12)  Multivitamins Caps (Multiple vitamin) .... Take 1 by mouth once daily  Patient Instructions: 1)  Please schedule a follow-up appointment in 6 months .   Current Allergies (reviewed today): ! LOPID ! SULFASALAZINE ! NIASPAN ! CARTIA XT LIPITOR

## 2011-01-19 NOTE — Assessment & Plan Note (Signed)
Summary: FLU VACCINE/ LETVAK  Nurse Visit   Allergies: 1)  ! Lopid 2)  ! Sulfasalazine 3)  ! Niaspan 4)  ! Cartia Xt 5)  Lipitor  Immunizations Administered:  Influenza Vaccine # 1:    Vaccine Type: Fluvax MCR    Site: left deltoid    Mfr: GlaxoSmithKline    Dose: 0.5 ml    Route: IM    Given by: Mervin Hack CMA (AAMA)    Exp. Date: 06/17/2011    Lot #: [id number]    VIS given: 07/12/10 version given September 15, 2010.  Flu Vaccine Consent Questions:    Do you have a history of severe allergic reactions to this vaccine? no    Any prior history of allergic reactions to egg and/or gelatin? no    Do you have a sensitivity to the preservative Thimersol? no    Do you have a past history of Guillan-Barre Syndrome? no    Do you currently have an acute febrile illness? no    Have you ever had a severe reaction to latex? no    Vaccine information given and explained to patient? yes    Are you currently pregnant? no  Orders Added: 1)  Influenza Vaccine MCR [00025]

## 2011-02-15 ENCOUNTER — Encounter: Payer: Self-pay | Admitting: Internal Medicine

## 2011-02-15 ENCOUNTER — Ambulatory Visit (INDEPENDENT_AMBULATORY_CARE_PROVIDER_SITE_OTHER): Payer: Medicare Other | Admitting: Internal Medicine

## 2011-02-15 DIAGNOSIS — I2510 Atherosclerotic heart disease of native coronary artery without angina pectoris: Secondary | ICD-10-CM

## 2011-02-15 DIAGNOSIS — E785 Hyperlipidemia, unspecified: Secondary | ICD-10-CM

## 2011-02-15 DIAGNOSIS — I10 Essential (primary) hypertension: Secondary | ICD-10-CM

## 2011-02-15 DIAGNOSIS — M130 Polyarthritis, unspecified: Secondary | ICD-10-CM

## 2011-02-23 NOTE — Assessment & Plan Note (Signed)
Summary: 6 month follow up/alc   Vital Signs:  Patient profile:   77 year old female Weight:      127 pounds Temp:     97.6 degrees F oral Pulse rate:   55 / minute Pulse rhythm:   regular BP sitting:   137 / 52  (left arm) Cuff size:   regular  Vitals Entered By: Mervin Hack CMA Duncan Dull) (February 15, 2011 9:36 AM) CC: follow-up, Hypertension Management   History of Present Illness: DOing okay has had some grieving and adjustment for her daughter with her SIL's death  Decided against getting place in [place] Son has place or they will rent if they want to go down there wants to stay here  Having more arthritic pain in hands and left arm Hands stiffen at times--some in Am also Trouble using utensils, esp for cutting ongoing feet and leg pain uses tylenol as needed   Heart has been fine Occ angina--uses NTG with success No change in exercise tolerance--still works out in yard Mild edema up to knees--uses hose  Hypertension History:      Positive major cardiovascular risk factors include female age 5 years old or older, hyperlipidemia, and hypertension.  Negative major cardiovascular risk factors include non-tobacco-user status.        Positive history for target organ damage include ASHD (either angina/prior MI/prior CABG) and peripheral vascular disease.     Allergies: 1)  ! Lopid 2)  ! Sulfasalazine 3)  ! Niaspan 4)  ! Cartia Xt 5)  Lipitor  Past History:  Past medical, surgical, family and social histories (including risk factors) reviewed for relevance to current acute and chronic problems.  Past Medical History: Reviewed history from 01/28/2010 and no changes required. Coronary artery disease--------------------------------Dr Bensihmon    --status post previous myocardial infarction     --stenting of the ramus branch and RCAA with drug-eluting stent in May 2008.     --Myoview 8/09:  EF 78%. normal perfusion Hypertension Osteoporosis Peripheral  vascular disease Hyperlipidemia - intolerance of many  statins Chronic shoulder and back pain Carotid u/s   --11/07: 0-39% bilaterally Osteoarthritis  Past Surgical History: Reviewed history from 05/14/2009 and no changes required. Hysterectomy MI / Stents  [facility] 2002 Cath --medical Rx only 12/2002 Bilat breast biopsies Myoview stress neg-- NL EF  11/2004 CP admit--stresss myoview negative. 09/2005 Cath-LAD/PAD Disease 01/2006 Adenosine myoview--no ischemia  EF70%  01/2006 R Cataract 09/2006 Cath--3 vessel disease  03/2007 Stents  RCA/LCX Multiple  04/2007 Excell Seltzer) 6/08 Incarcerated R femoral  hernia 4/10  fractured left leg  Family History: Reviewed history from 09/23/2009 and no changes required. Father: deceased 43; heart disease Mother: deceased 72; PE Sister: deceased 35; heart disease Sister: deceased 66; heart disease Brother: deceased [place] Brother: decesaed; ?  Social History: Reviewed history from 06/04/2007 and no changes required. Widowed 2005 after long time care for husband after stroke Remarried  has 3 children Never Smoked Alcohol use-no  Review of Systems       sleeps okay appetite is fine weight is down 3#  Physical Exam  General:  alert and normal appearance.   Neck:  supple, no masses, no thyromegaly, and no cervical lymphadenopathy.   Lungs:  normal respiratory effort, no intercostal retractions, no accessory muscle use, and normal breath sounds.   Heart:  normal rate, regular rhythm, no murmur, and no gallop.   Abdomen:  soft and non-tender.   Msk:  thenar atrophy and thickening in PIP and DIP joints  in hands Extremities:  no sig edema Psych:  normally interactive, good eye contact, not anxious appearing, and not depressed appearing.     Impression & Recommendations:  Problem # 1:  HYPERTENSION (ICD-401.9) Assessment Unchanged good control no changes needed labs fine  Her updated medication list for this problem includes:    Lisinopril  20 Mg Tabs (Lisinopril) .Marland Kitchen... Take 1 tablet by mouth once a day    Coreg 6.25 Mg Tabs (Carvedilol) .Marland Kitchen... Take 1 tablet by mouth two times a day    Amlodipine Besylate 10 Mg Tabs (Amlodipine besylate) .Marland Kitchen... Take 1 tablet by mouth once a day    Lasix 20 Mg Tabs (Furosemide) .Marland Kitchen... 1 every other day alternating  2 every other day  BP today: 137/52 Prior BP: 140/70 (10/07/2010)  10 Yr Risk Heart Disease: N/A  Labs Reviewed: K+: 4.1 (10/07/2010) Creat: : 0.82 (10/07/2010)   Chol: 188 (08/15/2010)   HDL: 43.50 (08/15/2010)   LDL: 81 (05/14/2009)   TG: 452.0 (08/15/2010)  Problem # 2:  CORONARY ARTERY DISEASE (ICD-414.00) Assessment: Unchanged stable angina for many years no changes appropriate  Her updated medication list for this problem includes:    Lisinopril 20 Mg Tabs (Lisinopril) .Marland Kitchen... Take 1 tablet by mouth once a day    Coreg 6.25 Mg Tabs (Carvedilol) .Marland Kitchen... Take 1 tablet by mouth two times a day    Amlodipine Besylate 10 Mg Tabs (Amlodipine besylate) .Marland Kitchen... Take 1 tablet by mouth once a day    Lasix 20 Mg Tabs (Furosemide) .Marland Kitchen... 1 every other day alternating  2 every other day    Adult Aspirin Ec Low Strength 81 Mg Tbec (Aspirin) .Marland Kitchen... Take 1 tablet by mouth once a day  Problem # 3:  HYPERLIPIDEMIA (ICD-272.4) Assessment: Unchanged intolerant of statins other than crestor   Her updated medication list for this problem includes:    Crestor 5 Mg Tabs (Rosuvastatin calcium) .Marland Kitchen... Take 1 tablet by mouth once a day  Labs Reviewed: SGOT: 18 (10/07/2010)   SGPT: 13 (10/07/2010)  10 Yr Risk Heart Disease: N/A   HDL:43.50 (08/15/2010), 56.80 (01/28/2010)  LDL:81 (05/14/2009), 79 (11/18/2008)  Chol:188 (08/15/2010), 166 (01/28/2010)  Trig:452.0 (08/15/2010), 208.0 (01/28/2010)  Problem # 4:  UNSPECIFIED POLYARTHROPATHY/POLYARTHRITIS HAND (ICD-716.54) Assessment: Unchanged recommended tylenol, padding for tools   Complete Medication List: 1)  Lisinopril 20 Mg Tabs (Lisinopril) ....  Take 1 tablet by mouth once a day 2)  Coreg 6.25 Mg Tabs (Carvedilol) .... Take 1 tablet by mouth two times a day 3)  Amlodipine Besylate 10 Mg Tabs (Amlodipine besylate) .... Take 1 tablet by mouth once a day 4)  Lasix 20 Mg Tabs (Furosemide) .Marland Kitchen.. 1 every other day alternating  2 every other day 5)  Crestor 5 Mg Tabs (Rosuvastatin calcium) .... Take 1 tablet by mouth once a day 6)  Nasonex 50 Mcg/act Susp (Mometasone furoate) .... 2 sprays each nostril daily as needed 7)  Gabapentin 300 Mg Caps (Gabapentin) .... Take 3 tabs at bedtime for leg nerve pain 8)  Potassium Chloride Crys Cr 20 Meq Cr-tabs (Potassium chloride crys cr) .... Take one by mouth daily 9)  Fish Oil Oil (Fish oil) .... Take 2 by mouth once daily 10)  Multivitamins Caps (Multiple vitamin) .... Take 1 by mouth once daily 11)  Adult Aspirin Ec Low Strength 81 Mg Tbec (Aspirin) .... Take 1 tablet by mouth once a day  Hypertension Assessment/Plan:      The patient's hypertensive risk group is category C: Target  organ damage and/or diabetes.  Today's blood pressure is 137/52.    Patient Instructions: 1)  Please schedule a follow-up appointment in 6 months .    Orders Added: 1)  Est. Patient Level IV [40981]    Current Allergies (reviewed today): ! LOPID ! SULFASALAZINE ! NIASPAN ! CARTIA XT LIPITOR  Prevention & Chronic Care Immunizations   Influenza vaccine: Fluvax MCR  (09/15/2010)    Tetanus booster: 12/18/1993: Historical    Pneumococcal vaccine: Historical  (12/18/1986)    H. zoster vaccine: Not documented  Colorectal Screening   Hemoccult: Not documented    Colonoscopy: Not documented  Other Screening   Pap smear: Not documented    Mammogram: Normal  (01/18/2002)    DXA bone density scan: Not documented   Smoking status: never  (06/04/2007)  Lipids   Total Cholesterol: 188  (08/15/2010)   LDL: 81  (05/14/2009)   LDL Direct: 77.8  (01/28/2010)   HDL: 43.50  (08/15/2010)   Triglycerides:  452.0  (08/15/2010)    SGOT (AST): 18  (10/07/2010)   SGPT (ALT): 13  (10/07/2010)   Alkaline phosphatase: 64  (10/07/2010)   Total bilirubin: 0.5  (10/07/2010)  Hypertension   Last Blood Pressure: 137 / 52  (02/15/2011)   Serum creatinine: 0.82  (10/07/2010)   Serum potassium 4.1  (10/07/2010)  Self-Management Support :    Hypertension self-management support: Not documented    Lipid self-management support: Not documented

## 2011-03-21 ENCOUNTER — Other Ambulatory Visit: Payer: Self-pay | Admitting: Internal Medicine

## 2011-04-10 ENCOUNTER — Other Ambulatory Visit: Payer: Self-pay | Admitting: *Deleted

## 2011-04-10 MED ORDER — NITROGLYCERIN 0.4 MG SL SUBL: SUBLINGUAL_TABLET | SUBLINGUAL | Status: DC

## 2011-04-11 ENCOUNTER — Other Ambulatory Visit: Payer: Self-pay | Admitting: *Deleted

## 2011-04-11 MED ORDER — NITROGLYCERIN 0.4 MG SL SUBL: SUBLINGUAL_TABLET | SUBLINGUAL | Status: DC

## 2011-04-11 NOTE — Telephone Encounter (Signed)
Okay #25 x 2

## 2011-05-02 NOTE — Assessment & Plan Note (Signed)
[facility] OFFICE NOTE   SHERYLE, VICE                         MRN:          [id number]  DATE:11/22/2007                            DOB:          12/13/1920    PRIMARY CARE PHYSICIAN:  Dr. Susa Simmonds.   INTERVAL HISTORY:  [name] is a delightful 77 year old woman with a  history of coronary artery disease, status post previous myocardial  infarction and PTA and stenting of the ramus branch in the right  coronary artery with drug-eluting stents in May of 2008.  She also has a  history of hyperlipidemia with SEVERE INTOLERANCE TO STATINS WITH A  BLISTERING RASH, hypertension, peptic ulcer disease with a remote GI  bleed and chronic shoulder pain which is thought to be musculoskeletal.   She comes in today for routine followup, she is doing great.  Denies any  chest pain whatsoever.  She does get occasionally short of breath but  this is really unchanged.  She has been having problems with some mild  lower extremity edema but no orthopnea or PND.  She has been tolerating  Crestor without a rash.  She is having problems with her medications as  she is in the donut hole and it is costing her over $400 a month.   CURRENT MEDICATIONS:  1. Lisinopril 20.  2. Crestor 5.  3. Imdur 30.  4. Plavix 75.  5. Zetia 10.  6. Coreg 6.25 b.i.d.  7. Ecotrin 81.  8. Norvasc 10.  9. Lasix 20 a day.  10.Potassium 20 a day.   PHYSICAL EXAMINATION:  She is well-appearing, no acute distress,  ambulates around the clinic without any respiratory difficulty.  Blood  pressure is 142/66, heart rate is 55, weight is 122.  HEENT:  Normal.  NECK:  Supple with no JVD, carotids are 2+ bilaterally without bruits,  there is no lymphadenopathy or thyromegaly.  CARDIAC:  PMI is nondisplaced, she has a regular rate and rhythm; no  murmurs, rubs, or gallops.  She is bradycardia and regular.  LUNGS:  Clear.  ABDOMEN:  Soft, nontender,  nondistended, no hepatosplenomegaly, no  bruits, no masses, good bowel sounds.  EXTREMITIES:  Warm with no cyanosis or clubbing, there is 1 to 2+ edema  from the mid-shin down bilaterally, no rash.  NEURO:  Alert and oriented x3, cranial nerves II through XII are intact,  moves all 4 extremities without difficulty.  Affect is pleasant.   EKG shows sinus bradycardia at a rate of 55, no ST-T wave abnormalities.   ASSESSMENT/PLAN:  1. Coronary artery disease status post previous stents.  She is      stable.  She is on a good medical regimen, continue current therapy      of Plavix indefinitely.  2. Hypertension, blood pressure is elevated today but in general this      has been well-controlled.  We will continue to follow.  3. Lower extremity edema, I suspect this is mostly secondary to her      Norvasc.  We will put compression stockings  on her, I will also      increase her Lasix to alternate with 20 and 40 on alternate days.      Will check a BMET in 1 week to make sure her renal function is      stable.  4. Hyperlipidemia, will recheck her lipid panel.  She is tolerating      her Crestor well, goal LDL is less than 70.   DISPOSITION:  Return to clinic in 4 months for routine followup.     Bevelyn Buckles. Bensimhon, MD  Electronically Signed    DRB/MedQ  DD: 11/22/2007  DT: 11/22/2007  Job #: [id number]

## 2011-05-02 NOTE — Assessment & Plan Note (Signed)
[facility] OFFICE NOTE   Christina [name], Christina [name]                         MRN:          [id number]  DATE:11/09/2008                            DOB:          June 18, 1920    PRIMARY CARE PHYSICIAN:  Karie Schwalbe, MD   INTERVAL HISTORY:  This is a delightful 77 year old woman with a history  of coronary artery disease status post previous myocardial infarction  and stenting of the ramus branch in the right coronary artery with drug-  eluting stent in May 2008.  She also has a history of hyperlipidemia  with intolerance to ALL STATINS, hypertension, peptic ulcer disease with  remote GI bleeding, as well as chronic shoulder and back pain.  She  returns today with her daughter for followup.   Overall, she is doing okay.  She does have intermittent episodes of  right-sided chest pain, which are chronic, these are totally unchanged.  She denies any exertional dyspnea.  Her main complaint continues to be  pain in her feet, which is really debilitating.  We previously started  her on Neurontin, but this does not seem to have helped that much.  We  titrated up, but she was unable to tolerate higher doses.   Current medications are Crestor 5 mg a day, Imdur 60 a day, Lasix 20 mg  every other day alternating with 40 mg, lisinopril 20 a day, Plavix 75 a  day, Coreg 6.25 b.i.d., aspirin 81 a day, Norvasc 10 a day, potassium 20  a day, Neurontin 300 mg once a day, and foot cream.   On physical exam, she is well appearing in no acute distress.  Looks  younger than her stated age.  Ambulates around the clinic without any  respiratory difficulty.  Blood pressure is 118/70, heart rate is 70,  weight is 131.  HEENT is normal.  Neck is supple.  There is no JVD.  Carotids are 2+ bilaterally without any bruits.  There is no  lymphadenopathy or thyromegaly.  Cardiac, PMI is nondisplaced.  Regular  rate and rhythm.  No murmurs, rubs, or  gallops.  Lungs are clear.  Abdomen soft, nontender, nondistended.  No hepatosplenomegaly.  No  bruits.  No masses.  Good bowel sounds.  Extremities are warm with no  cyanosis, clubbing.  There is 1 to 2+ edema on the left and trace edema  on the right, this is just around the ankles.  No rash.  Neuro, alert  and oriented x3.  Cranial nerves II through XII are intact.  Moves all 4  extremities without difficulty.  Affect is pleasant.   EKG shows sinus rhythm, rate of 70.  No ST-T wave abnormalities.   ASSESSMENT/PLAN:  1. Coronary artery disease is stable.  She is having some chest pain,      this is chronic.  She had a Myoview back in August, which showed no      evidence of ischemia.  We will continue current therapy.  2. Hypertension, well controlled.  3. Hyperlipidemia, followed by Dr. Alphonsus Sias.  Goal LDL  is less than 70.      Unfortunately, she has been intolerant of ALL STATINS.  4. Foot pain.  I do suspect this is neuropathic.  I suggested that      since it has been severe and      ongoing that she might consider talking with Dr. Alphonsus Sias about going      to see a neurologist for further evaluation.     Bevelyn Buckles. Bensimhon, MD  Electronically Signed    DRB/MedQ  DD: 11/09/2008  DT: 11/10/2008  Job #: [id number]

## 2011-05-02 NOTE — Assessment & Plan Note (Signed)
[facility] OFFICE NOTE   Christina [name], UBER                         MRN:          [id number]  DATE:04/26/2007                            DOB:          08/01/1920    PRIMARY CARE PHYSICIAN:  Dr. Susa Simmonds.   INTERVAL HISTORY:  Miss Torpey is an 77 year old woman with a history of  coronary artery disease, status post previous myocardial infarction. She  recently underwent cardiac catheterization for a chronic scapular pain  thought to be her anginal equivalent. This showed a significant 2 vessel  disease with blockages in the ramus and the right coronary artery. Her  previous stents were patent. She underwent 2 vessel angioplasty with 4  stents by Dr. Excell Seltzer on Apr 22, 2007. These were drug-eluting stents.  There was no complications. She returns today for routine followup.   She says after angioplasty her scapular pain resolved for 2 days but  then came back and now she continues to have current pain. This is not  necessarily exertional and just is really more of a chronic nuisance for  her. She has got back to her walking program and is doing well with  that. She denies any heart failure sings. She does admit to feeling  fatigued but it does not appear to be limiting her activities.   Remainder of past medical history is notable for hyperlipidemia with an  intolerance to statins, hypertension, and remoter peptic ulcer disease.   CURRENT MEDICATIONS:  1. Lisinopril 20 a day.  2. Plavix 75 a day.  3. Zetia 10.  4. Coreg 6.25 b.i.d.  5. Multivitamin.  6. Fish oil 1200 a day.  7. Garlic.  8. Ecotrin 81 a day.  9. Norvasc 10  day.  10.Welchol 625 mg 3 tablets b.i.d.  11.Lasix 20 a day.  12.Potassium 20 a day.  13.Imdur 120 a day.  14.Doxazosin 4 mg a day.  15.Calcium.   PHYSICAL EXAMINATION:  She is an elderly woman in no acute distress,  ambulates around the clinic without any respiratory difficultly.  Blood  pressure is 98/50, heart rate is 50.  HEENT: Normal.  NECK: Supple, no JVD. Carotids are 2+ bilaterally without any bruits.  There is no lymphadenopathy or thyromegaly.  CARDIAC:  He is bradycardic with no murmurs, rubs, or gallops.  LUNGS: Clear.  ABDOMEN: Soft, nontender, nondistended. No hepatosplenomegaly. No  bruits. No masses. Good bowel sounds. Groin has a very small hematoma  but no bruits.  EXTREMITIES: Warm with no cyanosis, clubbing, or edema. No rash.  NEURO: Alert and oriented x3, cranial nerves II-XII are intact. Moves  all 4 extremities without difficultly. Affect is pleasant.   ASSESSMENT/PLAN:  1. Coronary artery disease, status post multi vessel angioplasty. She      is doing well. She does have recurrent scapular pain but I do not      think this is her anginal equivalent. We will continue medical      therapy. I reminded her the need to be compliant with her Plavix on  a daily basis and also to continue her exercise program.   1. Hypertension, blood pressure is on the low side today. We will      discontinue her Doxazosin. I also think in the future we may also      be able to wean down her Imdur.   1. LDL still above goal at 97. We once again revisited the idea of      trying a low dose statin but she was resistant to this. She is      otherwise on a good medical regimen.   DISPOSITION:  Return to clinic in 6 months for a routine follow up.     Bevelyn Buckles. Bensimhon, MD     DRB/MedQ  DD: 04/26/2007  DT: 04/26/2007  Job #: [id number]   cc:   Karie Schwalbe, MD

## 2011-05-02 NOTE — Assessment & Plan Note (Signed)
[facility] OFFICE NOTE   Christina [name], Christina [name]                         MRN:          [id number]  DATE:08/27/2008                            DOB:          [birth year]    PRIMARY CARE PHYSICIAN:  Karie Schwalbe, MD   INTERVAL HISTORY:  Christina [name] is a delightful 77 year old woman with a history  of coronary artery disease status post previous myocardial infarction  and stenting of the ramus branch in her right coronary artery with drug-  eluting stents in May 2008.  She also has hyperlipidemia with  intolerance to statins, hypertension, peptic ulcer disease, with remote  GI bleeding, and chronic shoulder and back pain.  She returns today for  routine followup.   Overall, she is doing fairly well.  She is very excited about her  upcoming trip to [place].  She denies any chest pain or shortness of  breath.  Somehow, there has been some confusion on her Imdur dosing and  she has been getting 60 mg 3 times a day, but she has only been taking  once a day.  She also continues to have a problem with foot pain and  problems with proprioception.  We started her on Neurontin 100 b.i.d.  and checked B12 and thyroid panel and folate.  These were all normal.  The neuron really did not help much unfortunately.   CURRENT MEDICATIONS:  1. Crestor 5 a day.  2. Imdur 60 mg t.i.d., but only taking once a day.  3. Lasix 20 mg every other day, alternating with 40 mg.  4. Lisinopril 20 a day.  5. Plavix 75 a day.  6. Coreg 6.25 b.i.d.  7. Aspirin 81.  8. Norvasc 10.  9. Potassium 20.  10.Neurontin 20 a day and Neurontin 100 b.i.d.   PHYSICAL EXAM:  GENERAL:  She is well-appearing and looks younger than  her stated age.  Ambulates in the clinic without respiratory difficulty.  VITAL SIGNS:  Blood pressure 100/50, heart rate 71, weight 130.  HEENT:  Normal.  NECK:  Supple.  No JVD.  Carotids are 2+ bilaterally without bruits.  There  is no lymphadenopathy or thyromegaly.  CARDIAC:  PMI is nondisplaced.  Regular rate and rhythm.  No murmurs,  rubs, or gallops.  LUNGS:  Clear.  ABDOMEN:  Soft, nontender, and nondistended.  No hepatosplenomegaly.  No  bruits.  No masses.  Good bowel sounds.  EXTREMITIES:  Warm with no cyanosis or clubbing.  There is a trivial  ankle edema.  No rash.  NEURO:  Alert and oriented x3.  Cranial nerves II through XII are  intact.  Moves all 4 extremities without difficulty.  Affect is  pleasant.   ASSESSMENT/PLAN:  1. Coronary artery disease.  She is doing well.  No evidence of      ischemia.  Recent Myoview showed an EF of 78% with normal      perfusion.  2. Hypertension, well controlled.  3. Hyperlipidemia.  This is followed by Dr. Alphonsus Sias.  Her LDL has been  at goal, which is less than 70 pounds.  4. Foot pain.  I suspect this is neuropathic.  We will increase her      Neurontin to 300 mg b.i.d. and she can follow back up with Dr.      Alphonsus Sias.     Bevelyn Buckles. Bensimhon, MD  Electronically Signed    DRB/MedQ  DD: 08/27/2008  DT: 08/28/2008  Job #: [id number]

## 2011-05-02 NOTE — Discharge Summary (Signed)
[name], [name]                  ACCOUNT NO.:  [id number]   MEDICAL RECORD NO.:  [id number]          PATIENT TYPE:  INP   LOCATION:  6522                         FACILITY:  [facility]   PHYSICIAN:  Veverly Fells. Excell Seltzer, MD  DATE OF BIRTH:  02/04/1920   DATE OF ADMISSION:  04/22/2007  DATE OF DISCHARGE:  04/23/2007                               DISCHARGE SUMMARY   PRIMARY CARDIOLOGIST:  Dr. Arvilla Meres.   PRIMARY CARE PHYSICIAN:  Dr. Tillman Abide.   PROCEDURES PERFORMED DURING HOSPITALIZATION:  Multivessel percutaneous  coronary intervention.  A.  Ramus intermedius 80% to 0% with 2.5 x 16 Taxus.  Right posterior  descending artery 80% to 0% with 2.5 x 12 Taxus.  Distal right coronary  artery 85% to 0% with 2.75 x 16 Taxus.  Mid right coronary artery 85% to  0% with 3.0 x 20 Taxus, per Dr. Tonny Bollman on Apr 22, 2007.   DISCHARGE DIAGNOSES:  1. Coronary artery disease.      a.     Status post myocardial infarction in 2002 with stenting to       the left anterior descending and ramus branch with bare metal       stents.      b.     Status post cardiac catheterization in February of 2007,       revealing left main normal, left anterior descending 75% lesion       very distally, circumflex has 75% stenosis in the mid section just       proximal to the stent, right coronary artery had 50% lesion       distally, with a 75% lesion in the proximal portion of the       posterior descending artery.      c.     Adenosine Myoview February of 2007 showing ejection fraction       70% with no ischemia or scar.      d.     Continued angina.      e.     Status post cardiac catheterization revealing multivessel       coronary artery disease.  Right posterior descending artery 80%,       distal right coronary artery 85%, mid right coronary artery 85%,       ramus intermedius 80%, with percutaneous coronary intervention as       described above.  2. Hypertension.  3. Hypercholesterolemia.  4. Chronic lower extremity edema.   HISTORY OF PRESENT ILLNESS:  Christina [name] is an 77 year old female with a  history as described above, who had continued chest discomfort and  exercise intolerance.  The patient was seen and examined by Dr. Tillman Abide and Dr. Arvilla Meres prior to admission for intervention  secondary to multivessel coronary artery disease found by cardiac  catheterization.  The patient was admitted for PCI of multivessel  coronary artery disease as described above.  The patient tolerated the  procedure well without evidence of hematoma, bruit, or infection at  catheterization insertion site.  The patient had no recurrence of chest  discomfort.  The patient rested well the night postprocedure without  evidence of further discomfort or arrhythmias, or dyspnea on exertion as  described prior to admission.  The patient was seen and examined by Dr.  Tonny Bollman on Apr 23, 2007, with no complaints, and found to be  stable for discharge.  The patient's EKG revealed normal sinus rhythm  with nonspecific T wave abnormalities.   DISCHARGE LABS:  Hemoglobin 12.1, hematocrit 35.7, white blood cells  9.4, platelets 199, troponin 0.84, CK 98, MB 7.5.   VITAL SIGNS ON DISCHARGE:  Blood pressure 147/55, heart rate 67,  respirations 19, O2 saturation 93% on room air.   FOLLOWUP PLANS AND APPOINTMENTS:  1. The patient will follow with Dr. Arvilla Meres in the [facility]      office on May 08, 2007, at 11 a.m.  2. The patient has been given post cardiac intervention instructions,      with particular emphasis on the right groin site for evidence of      bleeding, hematoma, or infection.  3. The patient has been given instructions to continue home      medications previous to admission.  4. The patient is to follow up with primary care physician and make an      appointment on discharge.   DISCHARGE MEDICATIONS:  1. Lisinopril 20 mg daily.  2. Plavix 75 mg daily.  3.  Doxazosin 2 mg daily.  4. Zetia 10 mg daily.  5. Coreg 6.25 mg b.i.d.  6. Vitamins daily.  7. Fish oil 1200 mg daily.  8. Aspirin 81 mg daily.  9. Amlodipine 10 mg daily.  10.Imdur 120 mg daily.  11.WelChol 625, three tablets in the morning and three tablets in the      evening.  12.Furosemide 20 mg daily.  13.Potassium 20 mEq daily.   ALLERGIES:  STATINS.   TIME SPENT WITH THE PATIENT:  To include physician time 45 minutes.      Bettey Mare. Lyman Bishop, NP      Veverly Fells. Excell Seltzer, MD  Electronically Signed    KML/MEDQ  D:  04/23/2007  T:  04/23/2007  Job:  [id number]   cc:   Karie Schwalbe, MD

## 2011-05-02 NOTE — Assessment & Plan Note (Signed)
[facility] OFFICE NOTE   JAQUIA, BENEDICTO                         MRN:          [id number]  DATE:07/24/2008                            DOB:          December 11, 1920    PRIMARY CARE PHYSICIAN:  Karie Schwalbe, MD.   INTERVAL HISTORY:  Ms. Brookover is a delightful 77 year old woman with a  history of coronary artery disease status post previous myocardial  infarction and previous stenting.  Most recently she has had a stent to  the ramus branch and the right coronary artery with drug-eluting stents  in May 2008.  She also has a history of hyperlipidemia with intolerance  to STATINS with a severe blistering rash, hypertension, peptic ulcer  disease with remote GI bleeding, and chronic shoulder and back pain.  She returns today for routine followup.   Overall, she says, she is doing fairly well.  She does note that a  family gathering about few weeks ago, she developed pain in her scapula  and tingling in her upper lip, which is similar to her anginal  equivalent.  She took a nitroglycerin and that has resolved.  She  continues to have a sort of chronic pain in her back and she has a hard  time telling if this is her angina or not.  She is also having extensive  trouble with a foot pain.  She saw Dr. Alphonsus Sias who sent her to a  podiatry.  She had got some cortisone injections and this helped her  plantar fasciitis, but did not relieve the aching in her feet.  She says  this happens at night a lot and when she walks, she does not have any  claudication.  She has not had any sores.   CURRENT MEDICATIONS:  1. Crestor 5 a day.  2. Imdur 60 a day.  3. Lasix 20 alternating with 40 every day.  4. Lisinopril 20 a day.  5. Plavix 75 a day.  6. Coreg 6.25 b.i.d.  7. Aspirin 81.  8. Norvasc 10 a day.  9. Potassium 20 a day.   PHYSICAL EXAMINATION:  GENERAL:  She is well appearing.  She looks  younger than her stated age.   Ambulates around the clinic without any  respiratory difficulty.  VITAL SIGNS:  Blood pressure is 98/50, heart rate 76, and weight 128.  HEENT:  Normal.  NECK:  Supple.  No JVD.  Carotids are 2+ bilateral without bruits.  There is no lymphadenopathy or thyromegaly.  CARDIAC:  PMI is nondisplaced.  Regular rate and rhythm.  No murmurs,  rubs or gallops.  LUNGS:  Clear.  ABDOMEN:  Soft, nontender, and nondistended.  No hepatosplenomegaly.  No  bruits.  No masses.  Good bowel sounds.  EXTREMITIES:  Warm with no  cyanosis or clubbing.  She has 1+ ankle edema.  No rash.  DP pulses are  2+ bilaterally.  No ulcers on her feet.  NEURO:  Alert and oriented x3.  Cranial nerves II-XII are intact.  Moves  all 4 extremities without difficulty.  Affect  is pleasant.   ASSESSMENT AND PLAN:  1. Coronary artery disease.  She is having intermittent scapular pain      which may be representative of her angina.  We will go ahead with      adenosine Myoview.  Continue Imdur and p.r.n. nitroglycerin.  She      knows to call me if this is getting worse.  2. Hypertension, well controlled.  3. Hyperlipidemia.  She is followed by Dr. Alphonsus Sias.  Goal LDL is less      than 70 and most recently it was 51, which looks great.  She is      tolerating low-dose Crestor.  4. Foot pain.  I suspect this is neuropathy.  We will check B12,      folate, and TSH to make sure there is no easily reversible causes.      We will start her on Neurontin 100 b.i.d.   DISPOSITION:  We will see her back in several months for followup.     Bevelyn Buckles. Bensimhon, MD  Electronically Signed    DRB/MedQ  DD: 07/24/2008  DT: 07/25/2008  Job #: [id number]

## 2011-05-02 NOTE — Assessment & Plan Note (Signed)
[facility] OFFICE NOTE   Christina [name]                         MRN:          [id number]  DATE:08/22/2007                            DOB:          03-07-1920    PRIMARY CARE PHYSICIAN:  Christina [name], M.D.   INTERVAL HISTORY:  Christina [name] is a delightful 77 year old woman with a  history of coronary artery disease status post previous myocardial  infarction and PTCA and stenting of the ramus branch and right coronary  artery with drug eluting stents.  She also has a history of  hyperlipidemia with severe intolerance to statins with a blistering  rash, hypertension, and peptic ulcer disease with a remote GI bleed.  She has chronic shoulder pain which is thought to be musculoskeletal.   She comes in today for routine follow-up.  When we last saw her, she was  experiencing extreme fatigue.  She found out that she was actually  taking Norvasc 10 mg twice a day instead of just once a day and when she  stopped this she has felt much better.  She denies any chest pain and no  dyspnea.  Fatigue is much improved.  She does have occasional lower  extremity edema.  She has been able to tolerate her Crestor without a  rash.   CURRENT MEDICATIONS:  1. Lisinopril 20 mg a day.  2. Plavix 75 mg a day.  3. Zetia 10 mg a day.  4. Coreg 6.25 mg b.i.d.  5. Ecotrin 81 mg a day.  6. Norvasc 10 mg a day.  7. Lasix 20 mg a day.  8. Potassium 20 mEq a day.  9. Crestor 5 mg a day.  10.Protonix 40 mg a day.  11.IMDUR 60 mg a day.   PHYSICAL EXAMINATION:  GENERAL:  An elderly woman in no acute distress.  She ambulates around the clinic without any respiratory difficulty.  VITAL SIGNS:  Blood pressure 110/52, heart rate 55, weight is 117.  HEENT:  Normal.  NECK:  Supple.  No JVD.  Carotids are 2+ bilaterally without bruits.  There is no lymphadenopathy or thyromegaly.  HEART:  PMI is not displaced.  Regular rate and rhythm.   No murmurs,  rubs, or gallops.  LUNGS:  Clear.  ABDOMEN:  Soft, nontender, and nondistended.  No hepatosplenomegaly and  no bruits.  No masses.  Good bowel sounds.  EXTREMITIES:  Warm with no cyanosis, clubbing, or edema.  No rash.  NEUROLOGY:  Alert and oriented x3.  Cranial nerves II-XII grossly  intact.  Moves all four extremities without difficulty.  Affect is  pleasant.   ASSESSMENT:  1. Coronary artery disease.  This is quite stable.  I suspect her      scapular pain is musculoskeletal.  We will continue current      therapy.  2. Hyperlipidemia.  She appears to be tolerating her Crestor well.  We      did discuss temporarily that she may be need to increase the      Crestor and come off the Zetia  for cost reasons.  However, for the      time being, we will continue her current regimen.  Her most recent      LDL was 57.  3. Hypertension, well controlled.   DISPOSITION:  We will have her return to clinic in 3 months for routine  follow-up.     Christina Buckles. Bensimhon, MD  Electronically Signed    DRB/MedQ  DD: 08/22/2007  DT: 08/22/2007  Job #: [id number]

## 2011-05-02 NOTE — Assessment & Plan Note (Signed)
[facility]                            CARDIOLOGY OFFICE NOTE   KOBI, ALLER                         MRN:          [id number]  DATE:05/30/2007                            DOB:          [birth year]    PRIMARY CARE PHYSICIAN:  Dr. Susa Simmonds.   INTERVAL HISTORY:  Christina [name] is an 77 year old woman with a history of  coronary artery disease status post previous myocardial infarction.  She  previous underwent percutaneous intervention with stenting of ramus  branch and right coronary artery.  This was done with drug-eluting  stents.  She also has a history of hyperlipidemia with an intolerance to  statins, hypertension and peptic ulcer disease with a remote GI bleed.   She comes in today for routine followup.  Overall she is doing okay but  she feels somewhat weak.  She had one episode of chest pain, she took 2  nitroglycerin and that resolved.  Has not had any further episodes.  She  continues with chronic scapular pain.  She has not yet gotten back to  any sort of exercise program.  She says her feet hurt.  She thinks her  fatigue is secondary to her Welchol which was started recently, but she  denies any constipation, gas or bloating.   CURRENT MEDICATIONS:  1. Lisinopril 20.  2. Plavix 75.  3. Zetia 10.  4. Coreg 6.25 b.i.d.  5. Centrum Silver.  6. Fish Oil.  7. Garlic.  8. Aspirin 81.  9. Norvasc 10.  10.Welchol 625, three tablets b.i.d.  11.Lasix 20 a day.  12.Potassium 20 a day.  13.Imdur 120 a day.  14.Calcium 600 a day.   PHYSICAL EXAM:  She is an elderly woman who looks younger than her  stated age.  She ambulates around the clinic slowly without any  respiratory difficulty.  Blood pressure is 100/50, heart rate 60, weight  is 127.  HEENT:  Normal.  NECK:  Supple.  No JVD.  Carotids are 2+ bilaterally without any bruits,  there is no lymphadenopathy or thyromegaly.  CARDIAC:  Regular rate and rhythm with a soft S4, no murmurs  or rubs.  LUNGS:  Clear.  ABDOMEN:  Soft, nontender, nondistended.  No hepatosplenomegaly, no  bruits, no masses.  Good bowel sounds.  EXTREMITIES:  Warm with no cyanosis or clubbing.  There is trace edema  around her ankles, no rash.  NEURO:  Alert and oriented x3.  Cranial nerves II-XII are intact.  Moves  all 4 extremities without difficulty.  Affect is appropriate.   ASSESSMENT AND PLAN:  1. Coronary artery disease.  She is doing well.  She did have one      episode of chest pain.  I am not sure this was clearly ischemic but      we will continue her medical therapy.  She is on high dose Imdur      and I think we can decrease this to 60 a day.  Will follow her      closely.  2. Hypertension, well controlled.  3. Hyperlipidemia.  She is interested in stopping the Welchol.  I do      not think this is causing her weakness, but I tried to reinforce to      her how important it is to lower her LDL below 70.  We will try her      on low-dose Crestor 5 mg a day and see if she tolerates it,      continue Zetia.  Will recheck lipids in 3 months.  4. Weakness.  I suspect this is multifactorial.  Will check a CBC and      a BMET to be sure.  I encouraged her to get back involved with      exercise program.   DISPOSITION:  Return to clinic in 3 months for routine followup.     Bevelyn Buckles. Bensimhon, MD  Electronically Signed    DRB/MedQ  DD: 05/30/2007  DT: 05/30/2007  Job #: [id number]

## 2011-05-02 NOTE — Assessment & Plan Note (Signed)
[facility] OFFICE NOTE   Christina [name], Christina [name]                         MRN:          [id number]  DATE:03/20/2008                            DOB:          06/01/1920    PRIMARY CARE PHYSICIAN:  Dr. Susa Simmonds.   INTERVAL HISTORY:  Christina [name] is a delightful 77 year old woman with a  history of coronary artery disease status post previous myocardial  infarction and stenting of the ramus branch in the right coronary artery  with drug-eluting stents in May 2008.  She also has a history of  hyperlipidemia with intolerances to STATINS with a severe blistering  rash, hypertension, peptic ulcer disease with remote GI bleeding,  chronic shoulder pain.  She returns today for routine followup.   She says she really doing great.  She did say she had one episode of  chest pain a couple days ago after she ate a big meal and laid down on  the couch.  She took some nitroglycerin and this went away.  However,  she is not sure if it was angina.  She is fairly active, does a lot of  walking, and also recently had her husband's 90th birthday party, and  she was dancing without any problems.  There has been no heart failure  symptoms.  She was recently stopped on her Zetia due to cramping in her  hands and it has gone away since stopping.  Fortunately, she had been  tolerating low dose Crestor quite well.   CURRENT MEDICATIONS:  1. Crestor 5 mg a day.  2. Imdur 60 a day.  3. Lasix 20 mg a day alternating with 40 mg a day.  4. Lisinopril 20 a day.  5. Plavix 75 a day.  6. Coreg 6.25 b.i.d.  7. Aspirin 81.  8. Norvasc 10.  9. Potassium 20.   PHYSICAL EXAM:  She is well-appearing.  She looks younger than her  stated age.  Ambulates around the clinic without respiratory difficulty.  Blood pressure is 112/58, heart rate is 64.  Weight 123.  HEENT is normal.  NECK:  Supple.  No JVD.  Carotids are 2+ bilateral without bruits.  There  is no lymphadenopathy  or thyromegaly.  CARDIAC:  PMI is nondisplaced.  Regular rate and rhythm.  No murmurs,  rubs or gallops.  LUNGS:  Clear.  ABDOMEN:  Soft, nontender, nondistended, no hepatosplenomegaly.  No  bruits, no masses.  Good bowel sounds.  EXTREMITIES:  Warm with no cyanosis, clubbing or edema.  No rash.  NEURO:  Alert and x3.  Cranial II-XII are intact.  Moves all four  extremities without difficulty.  Affect is pleasant.   Most recent lipid panel shows total cholesterol of 133, HDL 49,  triglycerides of 440, and LDL of 51.  Liver function tests are normal.   ASSESSMENT/PLAN:  1. Coronary artery disease.  She is doing quite well.  No evidence of      ischemia.  I do not think her recent chest pain was ischemic.  I  think it was probably reflux.  Continue current therapy.  2. Hypertension, well-controlled.  3. Hyperlipidemia.  LDL is at goal.  Continue current therapy  If her      LDL creeps up above 70, could consider increasing Crestor to 10 but      no need to do that at this point.   DISPOSITION:  Will see back in several months for routine followup.     Bevelyn Buckles. Bensimhon, MD  Electronically Signed    DRB/MedQ  DD: 03/20/2008  DT: 03/20/2008  Job #: [id number]

## 2011-05-02 NOTE — Cardiovascular Report (Signed)
NAMEFLORENTINA, Christina [name]                  ACCOUNT NO.:  [id number]   MEDICAL RECORD NO.:  [id number]          PATIENT TYPE:  OIB   LOCATION:  6522                         FACILITY:  [facility]   PHYSICIAN:  Veverly Fells. Excell Seltzer, MD  DATE OF BIRTH:  June 06, 1920   DATE OF PROCEDURE:  04/22/2007  DATE OF DISCHARGE:                            CARDIAC CATHETERIZATION   PROCEDURE:  Percutaneous transluminal coronary angiography and drug-  eluting stent placement in the ramus intermedius, right posterior  descending artery, distal right coronary artery and mid right coronary  artery as well as StarClose of the right femoral artery.   INDICATIONS:  Ms. Dini is a delightful, 77 year old woman from  [place].  She presented initially with symptoms of stable angina.  She underwent a diagnostic catheterization by Dr. Gala Romney last week  and was found to have high-grade focal disease in her ramus intermedius  and high-grade serial lesions throughout the right coronary artery and  right PDA.  She was referred for coronary intervention.   Risks and indications of the procedure were explained to the patient.  Informed consent was obtained.  The right groin was prepped, draped and  anesthetized with 1% lidocaine.  Using the modified Seldinger technique,  a 6-French sheath was placed in the right femoral artery.  A Quantum,  3.5 mm guide catheter was inserted into the left coronary artery.  Angiomax was used for anticoagulation.  Once therapeutic ACT was  achieved, a Cougar wire was passed beyond the lesion in the intermediate  branch.  The lesion was calcified.  Therefore, it was predilated with of  2.25 x 15-mm balloon which was inflated to 6 atmospheres.  Following  predilatation, the area was stented with a 2.5 x 16-mm, Taxus stent and  was then deployed at 14 atmospheres.  It was postdilated with a 2.75 x  12-mm, Quantum Maverick balloon up to 16 atmospheres on two inflations.  Following stenting, there  was excellent stent expansion.  Off the distal  aspect of the stent, there is some residual disease and heavy  calcification.  I elected not to use overlapping stents in that segment.  There was TIMI-3 flow throughout the vessel.   At that point, the left coronary guide was removed and a JR-4 guide was  inserted.  The same Cougar guidewire was passed beyond the multiple  lesions in the right coronary artery out into the distal aspect of the  PDA without difficulty.  The proximal portion of the right coronary  artery is heavily diseased, however, there is no area of high-grade  stenosis.  The midportion of the vessel had high-grade focal disease as  did the distal portion of the vessel as well as the proximal portion of  the right PDA.  I elected to try to treat all those areas focally.  The  same 2.25 x 15-mm, Maverick balloon was taken out into the PDA branch  and was inflated to 4 atmospheres.  It was then brought back into the  distal right coronary artery and inflated to 6 atmospheres and then into  the mid right coronary  artery and inflated to 10 atmospheres.  Following  balloon predilatation, a 2.5 x 12-mm, Taxus stent was taken out to the  right PDA and was deployed at 14 atmospheres.  The stent was well-  expanded.  I elected to post dilate that stent with a 2.75 x 8-mm,  Quantum Maverick up to 16 atmospheres along the distal aspect and 20  atmospheres along the proximal aspect.  The stent was very well-expanded  after post dilatation.  A 2.75 x 16-mm, Taxus stent was then placed in  the distal portion of the vessel and deployed at 16 atmospheres.  That  stent was very well-expanded and there was a nice step-up and step-down  into the stent.  I elected to treat the midportion of the right coronary  artery with a 3 x 20-mm, Taxus stent which was deployed a 16  atmospheres.  This appeared well-sized and also had a nice step-up and  step-down.   At the conclusion of the procedure,  all stents were widely patent and  there was TIMI III flow throughout the vessel.  I elected not to treat  the area in the proximal portion of the right coronary artery as it  appears nonobstructive.  That would have involved another long  overlapping stent in that segment.   At the conclusion of the procedure, a StarClose device was used to seal  the right femoral arteriotomy.  The patient tolerated the entire  procedure well and had no immediate complications.   ASSESSMENT:  1. Successful stenting of the ramus intermedius branch of the left      circumflex.  2. Successful stenting in multiple areas of the right coronary artery      with Taxus drug-eluting stent in the right posterior descending      artery, the distal right coronary artery and the mid right coronary      artery.   RECOMMENDATIONS:  The patient will be kept overnight.  Recommend  lifelong dual antiplatelet therapy as she now has multiple stents in  place.  If she recovers well, she will be a candidate for discharge  tomorrow.      Veverly Fells. Excell Seltzer, MD  Electronically Signed     MDC/MEDQ  D:  04/22/2007  T:  04/22/2007  Job:  [id number]   cc:   Karie Schwalbe, MD  Bevelyn Buckles. Bensimhon, MD

## 2011-05-05 NOTE — Cardiovascular Report (Signed)
NAME:  TAGAN, BARTRAM NO.:  [id number]   MEDICAL RECORD NO.:  [id number]          PATIENT TYPE:  OIB   LOCATION:  1966                         FACILITY:  [facility]   PHYSICIAN:  Vida Roller, M.D.   DATE OF BIRTH:  03-Feb-1920   DATE OF PROCEDURE:  01/26/2006  DATE OF DISCHARGE:                              CARDIAC CATHETERIZATION   HISTORY OF PRESENT ILLNESS:  Ms. Carre is an 77 year old woman with known  coronary disease status post revascularization of her LAD and ramus arteries  in 2002 at [facility] which we think were with bare metal stents. She now presents  with worsening discomfort in her chest, was evaluated admission in  [place], had heart catheterization which was concerning for coronary  disease but the decision was made not to intervene.  She was referred to [place]  for a second opinion.  Dr. Tenny Craw felt strongly that she should have another  heart catheterization to determine her severity of her arteries and that is  why she is here.   PROCEDURES PERFORMED:  1.  Left heart catheterization.  2.  Selective coronary angiography.  3.  Left ventriculography all through the right femoral approach.   PROCEDURE IN DETAIL:  After obtaining informed consent, the patient was  brought to cardiac catheterization laboratory in the fasting state. There,  she was prepped and draped in usual sterile manner and local anesthetic was  obtained over the right groin using 1% lidocaine without epinephrine.  Right  femoral artery was cannulated using the modified Seldinger technique with a  4-French 10 cm sheath and left heart catheterization was performed using a  Judkins left #4, a Judkins right #4 and a pigtail catheter all 4-French.  The pigtail catheter was used for left ventriculography in the 30 degree RAO  view with a power injector.  At the conclusion of the procedure, the  catheters were removed. The patient was moved back to the holding area where  the femoral artery  sheath was removed. Hemostasis was obtained using direct  manual pressure.  At the conclusion of the hold there was no evidence of  ecchymosis or hematoma formation. Distal pulses were intact. Total  fluoroscopic time was 4 minutes, total ionized contrast 100 mL.   RESULTS:  Aortic pressure 172/67 with a mean of 111 mmHg.   Left ventricular pressure 172/2 with an end-diastolic pressure of 14 mmHg.   Selective coronary angiography:  The left main coronary artery is a moderate  caliber vessel with luminal irregularities.   Left anterior descending coronary artery is a large transapical vessel.  There is a stent in the proximal portion of the artery which has only some  minimal in-stent restenosis.  It has TIMI-3 grade flow.  There are two  diagonals which appear to be without significant disease. The distal LAD  after the second diagonal has about a 75% stenosis just prior to the apex.   There is a ramus intermedius artery which is reasonably large.  It is  bifurcating.  There is a stent in its mid portion.  There is a 75%  stenosis  just proximal to the stent at the takeoff of a branching vessel.   The circumflex coronary artery is a moderate caliber vessel without  significant obstructive disease.   The right coronary artery is a moderate caliber dominant vessel with a large  branching posterior descending coronary artery.  There is a long diffuse 30%  lesion in the proximal to mid portions of the artery. There is a second  discrete 50% lesion in the area just proximal to the crux.  The posterior  descending coronary artery has a 75% stenosis in its proximal portion.   Left ventriculogram reveals preserved LV systolic function, estimated  ejection fraction 55%, 1+ mitral regurgitation. There is hypokinesis of the  anterior apex and of the inferior basal portion of the heart.   INTERPRETATION:  Severe two-vessel disease in the left anterior descending  and proximal posterior  descending artery.  There is preserved left  ventricular systolic function. There is significant hypertension.   PLAN:  Review these films with our interventional colleagues and make a  decision as to further evaluate this.      Vida Roller, M.D.  Electronically Signed     JH/MEDQ  D:  01/26/2006  T:  01/26/2006  Job:  [id number]   cc:   Glendale Chard, M.D.   Pricilla Riffle, M.D.  [address]  [address]  [place]  [place] [address]

## 2011-05-05 NOTE — Assessment & Plan Note (Signed)
[facility] OFFICE NOTE   Christina [name], Christina [name]                         MRN:          [id number]  DATE:01/10/2007                            DOB:          11/29/1920    PRIMARY CARE PHYSICIAN:  Karie Schwalbe, M.D.   PATIENT IDENTIFICATION:  Christina [name] is an 77 year old woman with history  of coronary artery disease, status post remote inferior myocardial  infarction as well as chronic scapular pain, hyperlipidemia, and  hypertension, who returns for routine follow-up.  Overall, she is doing  quite well.  She did have a problem with severe plantar fasciitis which  limited her mobility, but now that seems to be better and she is back up  and around.  She does have chronic scapular pain which is unchanged.  About 2 weeks ago, she tells me she had an episode of chest pressure  when she was very frustrated after waiting at the doctor's office for 2  hours.  She took a nitroglycerin which helped some, but did not relieve  it completely.  It finally resolved on its own.  There were no  associated symptoms.  She has not had recurrent symptoms.  She is quite  concerned about Zetia as her daughter says that it is not the right  thing to take, however, she continues to take it.  She denies any heart  failure symptoms and no palpitations.   CURRENT MEDICATIONS:  1. Lisinopril 20 a day.  2. Plavix 75 a day.  3. Doxazosin 4 a day.  4. Zetia 10 a day.  5. Coreg 6.25 a day.  6. Multivitamin.  7. Fish Oil.  8. Garlic.  9. Aspirin 81.  10.Norvasc 10.  11.Welchol 625 two tablets twice a day.  12.IMDUR 60 a day.   ALLERGIES:  Intolerance to STATINS with severe rash and blistering.   PHYSICAL EXAMINATION:  GENERAL:  She is an elderly woman in no acute  distress, ambulates in the clinic without any respiratory difficulty.  VITAL SIGNS:  Blood pressure today is 124/60, heart rate 61, weight 133.  HEENT:  Sclerae anicteric.   EOMI.  There are no xanthelasmas.  Mucous  membranes are moist.  NECK:  Supple.  No JVD.  Carotids are 2+ bilaterally with a soft bruit  on the right side.  HEART:  She is irregular with 2/6 systolic ejection murmur at the right  sternal border.  S2 is preserved.  Soft apical murmur.  LUNGS:  Clear.  ABDOMEN:  Soft, nontender, and nondistended.  No hepatosplenomegaly.  No  bruits, no masses.  EXTREMITIES:  Warm with no cyanosis, clubbing, or edema.  NEUROLOGY:  She is alert and oriented x3.  Cranial nerves II-XII grossly  intact.  She moves all extremities without difficulty.  Affect is  bright.   EKG shows sinus rhythm at a rate of 61 with inferior Q waves which are  old.  There is some mild LVH, no acute ST-T wave changes.   ASSESSMENT:  1. Coronary artery disease, quite stable perhaps with episodic angina,  but she had a recent catheterization and Myoview which are quite      reassuring.  She does have a 75% lesion in the PDA, but I think we      need to just treat this medically for now.  2. Hypertension.  Very good control currently.  In the future, I might      consider changing Doxazosin over to a diuretic, although, I worry      about her getting volume depleted.  We will continue her current      regimen for now.  3. Hyperlipidemia.  She is on [employer] and Zetia.  Most recent LDL was      100.  We need to get this down to 70.  We will recheck her lipids      in 3 months and see how she is responding to her new therapy.  I      have reassured her about the recent Zetia trial and told her that      if her daughter had any questions, she should contact me.  We will      check a liver and lipid panel in 3 months.   DISPOSITION:  Return to clinic in 3 months for routine follow-up.     Bevelyn Buckles. Bensimhon, MD  Electronically Signed    DRB/MedQ  DD: 01/10/2007  DT: 01/10/2007  Job #: [id number]   cc:   Karie Schwalbe, MD

## 2011-05-05 NOTE — Assessment & Plan Note (Signed)
[facility] OFFICE NOTE   KERRY, ODONOHUE                         MRN:          [id number]  DATE:12/04/2006                            DOB:          1920-06-05    PRIMARY CARE PHYSICIAN:  Dr. Tillman Abide   PATIENT IDENTIFICATION:  Christina [name] is an 77 year old woman with multiple  medical problems who returns for routine followup here.   PROBLEM LIST:  For complete problem list, please see my note of October 23, 2006.  Problems include:  1. Coronary artery disease status post myocardial infarction.  2. Hypertension.  3. Hyperlipidemia with intolerance to STATINS with rash and      blistering.   CURRENT MEDICATIONS:  1. Lisinopril 20 a day.  2. Cartia XT 120 a day.  3. Plavix 75 a day.  4. Doxazosin 4 mg a day.  5. Imdur 120 a day.  6. Zetia 10 a day.  7. Coreg 6.25 b.i.d.  8. Centrum Silver.  9. Fish oil.  10.Garlic.  11.Ecotrin 81 mg a day.   INTERVAL HISTORY:  Christina [name] returns today for routine followup.  She  says she is doing quite well.  She did have an episode of a scapular  pain on December 2 which may or may not have been angina.  Since that  time she denies any chest pain or shortness of breath.  She is doing  quite well, no heart failure symptoms.  Her main complaint is her  bilateral foot pain related to plantar fasciitis.   PHYSICAL EXAMINATION:  GENERAL:  Shows an elderly woman in no acute  distress, ambulates around the clinic slowly without any respiratory  difficulty.  VITAL SIGNS:  Blood pressure is 150/74, heart rate is 56, weight is 134.  HEENT:  Sclerae anicteric, EOMI.  There are no xanthelasmas.  Mucous  membranes are moist.  NECK:  Supple.  There is no JVD.  Carotids are 2+ bilaterally.  There is  soft right-sided bruit radiating up from her aortic valve over her  clavicle.  CARDIAC:  She is bradycardic and regular.  There is a 2/6 systolic  ejection murmur at the  right sternal border.  S2 is well preserved.  There is a soft apical murmur.  LUNGS:  Clear.  ABDOMEN:  Soft, nontender, nondistended.  No hepatosplenomegaly, no  bruits, no mass.  EXTREMITIES:  Warm with no cyanosis, clubbing or edema.  NEUROLOGIC:  She is alert and oriented x3.  Cranial nerves II-XII are  intact.  She moves all four extremities without difficulty.  Affect is  bright.   ANCILLARY DATA:  Most recent cholesterol panel November 26, 2006:  Total  cholesterol 186, triglycerides 98, HDL 66, and LDL of 100.  LFTs are  normal.  Creatinine is 0.8.  Echocardiogram November 2007:  EF of 55-  60%, mild aortic regurgitation, mild mitral regurgitation.  Carotid  ultrasound November 12, 2006:  0-39% stenoses bilaterally.   EKG shows sinus bradycardia at a rate of 55 with no significant ST-T  wave abnormalities.  ASSESSMENT AND PLAN:  1. Coronary artery disease.  This is quite stable without any evidence      of significant angina.  She is on a good medical regimen.  We will      continue to decrease her Imdur to 60 mg a day.  2. Hyperlipidemia.  LDL is improved but still not at goal.  She has a      significant allergy to STATINS.  Will continue her fish oil and her      Zetia.  We will start WelChol  two tablets b.i.d.  3. Hypertension.  This is still suboptimally controlled.  We will      switch her Cartia over to Norvasc 10 mg a day for improved blood      pressure control without bradycardia.  We will see her back in a      month to continue to titrate her medications.  Eventually, I would      like to get her off the doxazosin and potentially increase her      lisinopril and add a low-dose diuretic.  4. Carotid bruit.  No significant stenosis by ultrasound.   DISPOSITION:  Return to clinic in 1 month for routine followup.     Bevelyn Buckles. Bensimhon, MD  Electronically Signed    DRB/MedQ  DD: 12/04/2006  DT: 12/04/2006  Job #: [id number]   cc:   Karie Schwalbe, MD

## 2011-05-05 NOTE — Assessment & Plan Note (Signed)
[facility] OFFICE NOTE   AME, HEAGLE                         MRN:          [id number]  DATE:04/05/2007                            DOB:          Oct 24, 1920    PRIMARY CARE PHYSICIAN:  Dr. Tillman Abide.   INTERVAL HISTORY:  Christina [name] is an 77 year old woman with a history of  coronary artery disease, status post previous myocardial infarction in  2002 with stenting to the LAD and ramus branch with bare mental stents.  Most recent catheterization was in February 2007. Left main was normal,  LAD had a 75% lesion very distally, circumflex had 75% stenosis in the  mid section just proximal to the stent, right coronary artery had a 50%  lesion distally. There was a 75% lesion in the proximal portion of the  PDA. Adenosine Myoview in February 2007 showed an ejection fraction of  70% with no ischemia or scar. Other past medical history is notable for  hypertension, hyperlipidemia, and chronic scapular pain. SHE IS  INTOLERANT TO STATINS DUE TO A SEVERE RASH AND BLEEDING.   Christina [name] returns today for a routine followup. She says over the past  3 to 4 months she feels that her exercise tolerance has decreased  significant to the point where she just can not go anymore. She says  that her scapular pain, which is fairly chronic, has been getting worse  and can occur at anytime but does also seem to occur somewhat with  exertion. She says that when it comes on she takes a nitroglycerine and  lays down and it goes away. She says that if she takes a Tylenol and  lays down it does not go away. She has also noticed some lower extremity  edema but denies orthopnea or PND.   CURRENT MEDICATIONS:  1. Lisinopril 20 a day.  2. Plavix 75 a day.  3. Doxazosin 4 a day.  4. Zetia 10 a day.  5. Coreg 6.25 b.i.d.  6. Centrum Silver.  7. Fish oil 1200 a day.  8. Ecotrin aspirin 81 mg a day.  9. Norvasc 10 a day.  10.Imdur 60 a day.  11.WelChol 625 mg 3 tablets b.i.d.   PHYSICAL EXAMINATION:  She is an elderly woman in no acute distress,  ambulates around the clinic without any respiratory difficultly. Blood  pressure is 110/60, heart rate is 57, weight is 130.  HEENT: Sclera anicteric. EOMI. There are no xanthelasma. Conjunctivae is  not injected. Mucous membranes are moist. Oropharynx  is clear.  NECK: Supple. No JVD. Carotids are 2 + bilaterally with a very soft  bruits on the right side.  HEART: She has a regular rate and rhythm with a 2/6 systolic ejection  murmur at the right sternal border.  LUNGS: Clear.  ABDOMEN: Soft, nontender, nondistended. No hepatosplenomegaly. No  bruits. No masses. Good bowel sounds.  EXTREMITIES: Warm with no cyanosis, clubbing, or edema.  No rash.  NEURO: Alert and oriented x3. Cranial nerves II-XII are intact. Moves  all 4 extremities without difficultly.  EKG: Shows sinus bradycardia at a rate of 57. She has some ST scooping  and T wave inversions inferiorly which are new since January of 2008.   ASSESSMENT/PLAN:  1. Progressive scapular pain (her anginal equivalent) and exercise      intolerance, in a woman with known coronary artery disease. Her      symptoms are quite concerning to me for progressive angina. We had      a long talk about the risks/benefits of catheterization and we have      agreed to proceed with catheterization. We will set this up for      Tuesday the 29th. I told her that if she has had a severe episode      that she needs to call 911 and go to the emergency room. I will      also increase her Imdur to 120 mg a day.  2. Lower extremity edema. We will start her on Lasix 20 mg a day with      some potassium at 20 mEq a day. We will check a CMET next week.  3. Hyperlipidemia, recheck lipids next week with pre catheterization      labs.  4. Hypertension, well controlled. We will decrease her doxazosin. To 2      mg a day given the  addition of Lasix and the titration of her      nitroglycerine. I would hope though to eventually wean off her      doxazosin.   DISPOSITION:  We will see her pending the results of her  catheterization. We will  see her back in clinic in 1 month. Her  catheterization will be __________.     Bevelyn Buckles. Bensimhon, MD  Electronically Signed    DRB/MedQ  DD: 04/05/2007  DT: 04/05/2007  Job #: [id number]

## 2011-05-05 NOTE — Assessment & Plan Note (Signed)
[facility] OFFICE NOTE   VENDELA, TROUNG                         MRN:          [id number]  DATE:10/23/2006                            DOB:          23-Aug-1920    PRIMARY CARE PHYSICIAN:  Karie Schwalbe, MD.   [name] is an 77 year old woman with multiple medical problems previously  followed by Dr. Tenny Craw and who presents to establish long-term care in our  [place] office.   PROBLEM LIST:  1. Coronary artery disease.      a.     Status post myocardial infarction in 2002.      b.     Status post PTCA and stenting to the LAD and ramus branch with       bare metal stents at [facility] in 2002.      c.     Most recent cardiac catheterization in February 2007, left main       was normal. LAD had a 75% very distally. Circumflex showed a 75%       stenosis in the mid section just proximal to the stent, the stent was       widely patent. The right coronary artery had a 50% lesion distally.       There was a 75% lesion in the proximal portion of the PDA. LV function       55% with 1+mitral regurgitation.      d.     Adenosine Myoview February 2007 post catheterization showed an       EF of 70% with no ischemia.  2. Chronic scapular pain.  3. Hypertension.  4. Hyperlipidemia.  5. Osteoporosis.  6. Peptic ulcer disease.   ALLERGIES:  All STATINS which she gets burning and redness on her feet as  well as a rash. Also allergic to SULFA.   CURRENT MEDICATIONS:  1. Plavix 25 a day.  2. Lisinopril 20 mg a day.  3. Diltiazem 120 a day.  4. Toprol 50 a day.  5. Doxazosin 4 mg a day.  6. Imdur 180 a day.  7. Xanax 0.25 p.r.n.  8. Nitroglycerin p.r.n.  9. Fish oil.  10.Aspirin 81.  11.Garlic.  12.Multivitamin.   INTERVAL HISTORY:  Ms. Janvrin presents today to establish long-term care in  our [place] office. She is here with her daughter. Overall she seems to  be doing fairly well. She does say she has  good days and bad days but the  good days seem to out number the bad. She tells me that she has chronic  right scapular pain which is similar to her previous MI. This was going on  for the past 4 years without significant change. She said there might have  been a mild change after her angioplasties but thinks it came back soon  after. It occurs approximately 2 times a week. It can occur at any time but  particularly at rest. When it occurs she usually takes 1or 2 nitroglycerin,  lays down and it goes away. This has not changed substantially  at all. She  has no nocturnal angina. She does go to the heart track and walks at the  mall 3 times a week about a mile at a time without any chest or scapular  discomfort. She does have occasional shortness of breath but not necessarily  related to her scapular pain.   PHYSICAL EXAMINATION:  GENERAL:  She is an elderly woman in no acute  distress. Ambulates around the clinic without any respiratory difficulty.  VITAL SIGNS:  Blood pressure is 136/58, her heart rate is 53, weight is 137.  HEENT:  Sclera anicteric. EOMI. There was no xanthelasma. Mucous membranes  are moist.  NECK:  Supple. There is no JVD. Her carotids are fairly prominent, 2-3+  bilaterally. She does have right-sided bruit which does radiate up from her  aortic valve over her clavicle. I do not hear it on the left.  CARDIAC:  She is bradycardic and regular. There is a 2/6 early peaking  systolic murmur at the right sternal border, S2 is well preserved. There is  a soft apical murmur.  LUNGS:  Clear.  ABDOMEN:  Soft, nontender, nondistended. No hepatosplenomegaly, no bruits,  no masses.  EXTREMITIES:  Warm with no cyanosis, clubbing or edema. DP pulses are  prominent 2+ bilaterally.  NEUROLOGIC:  She is alert and oriented x3. Cranial nerves II-XII are intact,  moves all four extremities without difficulty. She has mild pain on  palpation over her right scapula.    ASSESSMENT/PLAN:  1. Scapular pain. This is both typical and atypical features for angina.      Given that fact that it has been quite stable without any significant      exertional component, I do not feel compelled to pursue another cardiac      catheterization. She has had a recent catheterization as well as a      nuclear study which showed no ischemia. We did discuss the possibility      of reevaluation for angioplasty or medical therapy. Given the recent      COURAGE trial results, I reassured her that I thought she would do      quite well with medical therapy and she is quite happy to avoid any      further invasive testing or treatment. She is on quite a high dose of      Imdur and I think we should go ahead and decrease this to 120 mg a day.      I have urged her to keep up with her exercise program.  2. Coronary artery disease as above. Given her significant disease, I      think it is imperative that we get her LDL down. Most recently it was      128. I have given her a prescription for Zetia as well as some samples      and we will see how she tolerates this. She is intolerant of statins.  3. Hypertension. Blood pressure is mildly elevated. We will switch her      Toprol over to Coreg 6.25 b.i.d. However, I have told her to wait on      this until we are sure that she can tolerate the Zetia so that if she      has adverse effects with one or the other, we will be able to tell      which one.  4. Hyperlipidemia as above.  5. Carotid bruit. I suspect this is radiated off her aortic valve.  However, given her significant vascular disease, we will proceed with      ultrasound to further evaluate.  6. Aortic stenosis. This sounds very mild. We will get an echocardiogram      to clearly evaluate.   DISPOSITION:  We will see her back in clinic in a couple of months for  further evaluation.   Of note, if possible I would like to continue to titrate up her Coreg and wean off  her doxazosin if at all possible.     Bevelyn Buckles. Bensimhon, MD  Electronically Signed    DRB/MedQ  DD: 10/23/2006  DT: 10/24/2006  Job #: [id number]   cc:   Karie Schwalbe, MD

## 2011-05-31 ENCOUNTER — Encounter: Payer: Self-pay | Admitting: Internal Medicine

## 2011-06-05 ENCOUNTER — Encounter: Payer: Self-pay | Admitting: Internal Medicine

## 2011-06-07 ENCOUNTER — Ambulatory Visit (INDEPENDENT_AMBULATORY_CARE_PROVIDER_SITE_OTHER): Payer: Medicare Other | Admitting: Internal Medicine

## 2011-06-07 ENCOUNTER — Encounter: Payer: Self-pay | Admitting: Internal Medicine

## 2011-06-07 VITALS — BP 105/62 | HR 52 | Ht 61.0 in | Wt 128.4 lb

## 2011-06-07 DIAGNOSIS — I2510 Atherosclerotic heart disease of native coronary artery without angina pectoris: Secondary | ICD-10-CM

## 2011-06-07 DIAGNOSIS — R252 Cramp and spasm: Secondary | ICD-10-CM

## 2011-06-07 DIAGNOSIS — I10 Essential (primary) hypertension: Secondary | ICD-10-CM

## 2011-06-07 DIAGNOSIS — E785 Hyperlipidemia, unspecified: Secondary | ICD-10-CM

## 2011-06-07 NOTE — Patient Instructions (Signed)
We will call you with your lab results.  Your physician recommends that you schedule a follow-up appointment in: 6 months

## 2011-06-07 NOTE — Assessment & Plan Note (Signed)
Followed by Dr. Alphonsus Sias. Unable to tolerate any statins.

## 2011-06-07 NOTE — Assessment & Plan Note (Signed)
Doing well. Occasional CP. Very stable pattern. She knows if CP gets more frequent or severe she is to let me know.

## 2011-06-07 NOTE — Progress Notes (Signed)
HPI:  Christina [name] is a delightful 77 year old woman with a history of coronary artery disease status post previous myocardial infarction and stenting of the ramus branch and the right coronary artery with drug-eluting stent in May 2008.  She also has a history of hyperlipidemia withprevious  intolerance to ALL STATINS, hypertension, peptic ulcer disease with remote GI bleeding, as well as chronic shoulder and back pain.  She is here for routine f/u.  Still having a hard time with leg pain and neuropathy. Feels some better. But still with a lot of cramps in her legs and hands. However still able to get out in yard and work on her garden.  No significant dyspnea. Has CP about once a week and will take NTG. Mostly when she gets worried. No CP when walking. Very stable pattern.    ROS: All systems negative except as listed in HPI, PMH and Problem List.  Past Medical History  Diagnosis Date  . CAD (coronary artery disease)     S/P previous MI; stenting of the ramus branch and RCAA with the drug-eluting stent in May 2008; myoview 8/09: EF 78%, normal perfusion  . Hypertension   . Osteoporosis   . Peripheral vascular disease   . Hyperlipidemia     Intolerant of many statins  . Chronic shoulder pain   . Chronic back pain   . Osteoarthritis     Current Outpatient Prescriptions  Medication Sig Dispense Refill  . amLODipine (NORVASC) 10 MG tablet TAKE ONE TABLET BY MOUTH EVERY DAY  90 tablet  3  . aspirin 81 MG EC tablet Take 81 mg by mouth daily.        . carvedilol (COREG) 6.25 MG tablet TAKE ONE TABLET BY MOUTH TWICE DAILY  180 tablet  3  . CRESTOR 5 MG tablet TAKE ONE TABLET BY MOUTH EVERY DAY  90 each  3  . Fish Oil OIL Take 2 capsules by mouth daily.        . furosemide (LASIX) 20 MG tablet Take 20 mg by mouth daily as needed.       . gabapentin (NEURONTIN) 300 MG capsule Take 300 mg by mouth 3 (three) times daily.        Marland Kitchen lisinopril (PRINIVIL,ZESTRIL) 20 MG tablet TAKE ONE TABLET BY MOUTH EVERY  DAY  90 tablet  3  . mometasone (NASONEX) 50 MCG/ACT nasal spray Place 2 sprays into the nose daily as needed.        . Multiple Vitamin (MULTIVITAMIN) capsule Take 1 capsule by mouth daily.        . nitroGLYCERIN (NITROSTAT) 0.4 MG SL tablet Use as directed  25 tablet  3  . potassium chloride SA (K-DUR,KLOR-CON) 20 MEQ tablet Take 20 mEq by mouth daily.           PHYSICAL EXAM: Filed Vitals:   06/07/11 1010  BP: 105/62  Pulse: 52   General:  Well appearing. No resp difficulty HEENT: normal Neck: supple. JVP flat. Carotids 2+ bilaterally; no bruits. No lymphadenopathy or thryomegaly appreciated. Cor: PMI normal. Regular rate & rhythm. No rubs, gallops or murmurs. Lungs: clear Abdomen: soft, nontender, nondistended. No hepatosplenomegaly. No bruits or masses. Good bowel sounds. Extremities: no cyanosis, clubbing, rash, edema Neuro: alert & orientedx3, cranial nerves grossly intact. Moves all 4 extremities w/o difficulty. Affect pleasant.    ECG: Sinus brady 52. Anteroseptal Qs. No ST-T wave abnormalities.     ASSESSMENT & PLAN:

## 2011-06-07 NOTE — Assessment & Plan Note (Signed)
Blood pressure well controlled. Continue current regimen.  

## 2011-06-08 LAB — BASIC METABOLIC PANEL
Potassium: 4.5 mEq/L (ref 3.5–5.3)
Sodium: 141 mEq/L (ref 135–145)

## 2011-07-03 ENCOUNTER — Other Ambulatory Visit: Payer: Self-pay | Admitting: Internal Medicine

## 2011-08-14 ENCOUNTER — Encounter: Payer: Self-pay | Admitting: Internal Medicine

## 2011-08-16 ENCOUNTER — Ambulatory Visit (INDEPENDENT_AMBULATORY_CARE_PROVIDER_SITE_OTHER): Payer: Medicare Other | Admitting: Internal Medicine

## 2011-08-16 ENCOUNTER — Encounter: Payer: Self-pay | Admitting: Internal Medicine

## 2011-08-16 VITALS — BP 129/52 | HR 52 | Temp 97.7°F | Ht 61.0 in | Wt 124.0 lb

## 2011-08-16 DIAGNOSIS — S43401A Unspecified sprain of right shoulder joint, initial encounter: Secondary | ICD-10-CM | POA: Insufficient documentation

## 2011-08-16 DIAGNOSIS — M130 Polyarthritis, unspecified: Secondary | ICD-10-CM

## 2011-08-16 DIAGNOSIS — I2510 Atherosclerotic heart disease of native coronary artery without angina pectoris: Secondary | ICD-10-CM

## 2011-08-16 DIAGNOSIS — IMO0002 Reserved for concepts with insufficient information to code with codable children: Secondary | ICD-10-CM

## 2011-08-16 DIAGNOSIS — I10 Essential (primary) hypertension: Secondary | ICD-10-CM

## 2011-08-16 DIAGNOSIS — E785 Hyperlipidemia, unspecified: Secondary | ICD-10-CM

## 2011-08-16 LAB — HEPATIC FUNCTION PANEL
ALT: 15 U/L (ref 0–35)
AST: 17 U/L (ref 0–37)
Total Bilirubin: 0.5 mg/dL (ref 0.3–1.2)

## 2011-08-16 LAB — CBC WITH DIFFERENTIAL/PLATELET
Eosinophils Relative: 1.6 % (ref 0.0–5.0)
HCT: 37.5 % (ref 36.0–46.0)
Hemoglobin: 12.4 g/dL (ref 12.0–15.0)
Lymphs Abs: 1.2 10*3/uL (ref 0.7–4.0)
Monocytes Relative: 9.4 % (ref 3.0–12.0)
Neutro Abs: 4.5 10*3/uL (ref 1.4–7.7)
Platelets: 185 10*3/uL (ref 150.0–400.0)
RBC: 3.93 Mil/uL (ref 3.87–5.11)
WBC: 6.4 10*3/uL (ref 4.5–10.5)

## 2011-08-16 LAB — LIPID PANEL
Cholesterol: 156 mg/dL (ref 0–200)
LDL Cholesterol: 81 mg/dL (ref 0–99)
Total CHOL/HDL Ratio: 3

## 2011-08-16 MED ORDER — NITROGLYCERIN 0.4 MG SL SUBL: 0.4000 mg | SUBLINGUAL_TABLET | SUBLINGUAL | Status: DC | PRN

## 2011-08-16 MED ORDER — MOMETASONE FUROATE 50 MCG/ACT NA SUSP: 2.0000 | Freq: Every day | NASAL | Status: DC | PRN

## 2011-08-16 NOTE — Assessment & Plan Note (Signed)
About the same Some help from tylenol

## 2011-08-16 NOTE — Assessment & Plan Note (Signed)
Tolerates low dose crestor Lab Results  Component Value Date   LDLCALC 81 05/14/2009   Recheck levels today

## 2011-08-16 NOTE — Progress Notes (Signed)
Subjective:    Patient ID: Christina [name], female    DOB: 27-May-1920, 76 y.o.   MRN: [id number]  HPI Doing okay for right shoulder Achy in shoulder and pain is relieved somewhat when she presses on distal right volar forearm Not much work in yard now Started about 2 weeks ago Some weakness---has dropped things from that hand  Some back pain also Chronic hand pain Tylenol not much help--even this makes her sleepy  Still gets angina at times Had yesterday and relieved by NTG as usual No increase in pattern  Breathing is okay Stable DOE---like walking up a hill Edema seems better  Current Outpatient Prescriptions on File Prior to Visit  Medication Sig Dispense Refill  . amLODipine (NORVASC) 10 MG tablet TAKE ONE TABLET BY MOUTH EVERY DAY  90 tablet  3  . aspirin 81 MG EC tablet Take 81 mg by mouth daily.        . carvedilol (COREG) 6.25 MG tablet TAKE ONE TABLET BY MOUTH TWICE DAILY  180 tablet  3  . CRESTOR 5 MG tablet TAKE ONE TABLET BY MOUTH EVERY DAY  90 each  3  . Fish Oil OIL Take 2 capsules by mouth daily.        . furosemide (LASIX) 20 MG tablet Take 20 mg by mouth daily as needed.       . gabapentin (NEURONTIN) 300 MG capsule TAKE ONE CAPSULE BY MOUTH THREE TIMES DAILY  270 capsule  0  . lisinopril (PRINIVIL,ZESTRIL) 20 MG tablet TAKE ONE TABLET BY MOUTH EVERY DAY  90 tablet  3  . Multiple Vitamin (MULTIVITAMIN) capsule Take 1 capsule by mouth daily.        . potassium chloride SA (K-DUR,KLOR-CON) 20 MEQ tablet Take 20 mEq by mouth daily.          Allergies  Allergen Reactions  . Atorvastatin     REACTION: blisters on feet  . Diltiazem Hcl   . Gemfibrozil   . Niacin   . Sulfasalazine     Past Medical History  Diagnosis Date  . CAD (coronary artery disease)     S/P previous MI; stenting of the ramus branch and RCAA with the drug-eluting stent in May 2008; myoview 8/09: EF 78%, normal perfusion  . Hypertension   . Osteoporosis   . Peripheral vascular disease     . Hyperlipidemia     Intolerant of many statins  . Chronic shoulder pain   . Chronic back pain   . Osteoarthritis     Past Surgical History  Procedure Date  . Carotid u/s 10/2006    0-39% bilaterally  . Vesicovaginal fistula closure w/ tah   . Coronary stent placement 2002    MI, [facility]  . Coronary angioplasty 12/2002    Medical rx only  . Breast biopsy     Bilateral  . Nm myoview ltd 11/2004    Stress, negative; NL EF  . Cp admit 09/2005    Stress myoview negative  . Coronary angioplasty 01/2006    LAD/PAD disease  . Adenosine myoview 01/2006    No ischemia, EF 70%  . Cataract extraction 09/2006    Right  . Coronary angioplasty 03/2007    3 vessel disease  . Coronary stent placement 04/2007    RCA/LCX multiple, [facility]  . Femoral hernia repair 05/2007    Incarcerated, right  . Fracture surgery 03/2009    Left leg    Family History  Problem Relation Age of  Onset  . Pulmonary embolism Mother   . Heart disease Father   . Heart disease Sister   . Cancer Brother   . Heart disease Sister     History   Social History  . Marital Status: Widowed    Spouse Name: N/A    Number of Children: 3  . Years of Education: N/A   Occupational History  . Not on file.   Social History Main Topics  . Smoking status: Never Smoker   . Smokeless tobacco: Never Used  . Alcohol Use: No  . Drug Use: No  . Sexually Active: Not on file   Other Topics Concern  . Not on file   Social History Narrative   Widowed 2005 after long time care for husband after strokeRemarriedHas 3 children   Review of Systems Sleeps well Appetite is good Weight stable Generally happy     Objective:   Physical Exam  Constitutional: She appears well-developed and well-nourished. No distress.  Neck: Normal range of motion. Neck supple.  Cardiovascular: Normal rate, regular rhythm and normal heart sounds.  Exam reveals no gallop.   No murmur heard. Pulmonary/Chest: Effort normal and breath  sounds normal. No respiratory distress. She has no wheezes. She has no rales.  Musculoskeletal: She exhibits no edema.       Fairly normal passive ROM of right shoulder Some pain with full abduction and ext rotation No sig crepitus No bursa tenderness  Lymphadenopathy:    She has no cervical adenopathy.  Neurological:       No focal weakness  Psychiatric: She has a normal mood and affect. Her behavior is normal. Judgment and thought content normal.          Assessment & Plan:

## 2011-08-16 NOTE — Assessment & Plan Note (Signed)
Seems to be a muscle thing Discussed heat and should resolve on its own

## 2011-08-16 NOTE — Assessment & Plan Note (Signed)
BP Readings from Last 3 Encounters:  08/16/11 129/52  06/07/11 105/62  02/15/11 137/52   Good control No changes needed Lab Results  Component Value Date   CREATININE 0.98 06/07/2011

## 2011-08-16 NOTE — Assessment & Plan Note (Signed)
Stable angina pattern for many years No changes needed

## 2011-08-18 ENCOUNTER — Encounter: Payer: Self-pay | Admitting: *Deleted

## 2011-08-25 ENCOUNTER — Telehealth: Payer: Self-pay | Admitting: *Deleted

## 2011-08-25 MED ORDER — TRAMADOL HCL 50 MG PO TABS: ORAL_TABLET | ORAL | Status: DC

## 2011-08-25 NOTE — Telephone Encounter (Signed)
Spoke with Christina [name] and advised her as instructed via telephone.  Rx for Tramadol sent to [facility].

## 2011-08-25 NOTE — Telephone Encounter (Signed)
Pt was seen a couple of weeks ago and told you about some arm pain that she was having.  This is not getting any better and pt is having a lot of pain.  Daughter is asking if pt can have something for this pain.  She's been taking tylenol but that isnt helping.  She doesn't want anything strong, as meds knock her out, and she still drives, but she does need something stronger than tylenol.  Uses [facility] [address]

## 2011-08-25 NOTE — Telephone Encounter (Signed)
Okay to try tramadol 50mg   1/2-1 tid prn for severe pain #30 x 0 She should not drive after taking it until she sees how it affects her Definitely try 1/2 first

## 2011-10-30 ENCOUNTER — Other Ambulatory Visit: Payer: Self-pay | Admitting: Internal Medicine

## 2011-11-21 ENCOUNTER — Encounter: Payer: Self-pay | Admitting: Cardiology

## 2011-11-22 ENCOUNTER — Ambulatory Visit (INDEPENDENT_AMBULATORY_CARE_PROVIDER_SITE_OTHER): Payer: Medicare Other | Admitting: Cardiology

## 2011-11-22 ENCOUNTER — Encounter: Payer: Self-pay | Admitting: Cardiology

## 2011-11-22 VITALS — BP 108/65 | HR 58 | Ht 61.0 in | Wt 123.8 lb

## 2011-11-22 DIAGNOSIS — E785 Hyperlipidemia, unspecified: Secondary | ICD-10-CM

## 2011-11-22 DIAGNOSIS — R0602 Shortness of breath: Secondary | ICD-10-CM

## 2011-11-22 DIAGNOSIS — I2510 Atherosclerotic heart disease of native coronary artery without angina pectoris: Secondary | ICD-10-CM

## 2011-11-22 DIAGNOSIS — E538 Deficiency of other specified B group vitamins: Secondary | ICD-10-CM

## 2011-11-22 NOTE — Patient Instructions (Signed)
We will call you with lab results.  Your physician has requested that you have an echocardiogram. Echocardiography is a painless test that uses sound waves to create images of your heart. It provides your doctor with information about the size and shape of your heart and how well your heart's chambers and valves are working. This procedure takes approximately one hour. There are no restrictions for this procedure.  Your physician recommends that you schedule a follow-up appointment in: 6 months

## 2011-11-23 LAB — BRAIN NATRIURETIC PEPTIDE: BNP: 54.4 pg/mL (ref 0.0–100.0)

## 2011-11-24 NOTE — Assessment & Plan Note (Signed)
Mild exertional dyspnea, gradually progressive.  She does not appear volume overloaded on exam.  This may be a byproduct of aging.  I will get an echocardiogram and check a BNP.

## 2011-11-24 NOTE — Assessment & Plan Note (Signed)
Has had side effects with multiple statins but is tolerating low dose Crestor.  Ideally, would want LDL < 70.  However, I think she is close enough on current Crestor regimen.  Continue without change.

## 2011-11-24 NOTE — Assessment & Plan Note (Signed)
Stable with no exertional chest pain.  No change to ECG.  Continue ASA, statin, Coreg, ACEI.

## 2011-11-24 NOTE — Progress Notes (Signed)
PCP: Dr. Alphonsus Sias  77 yo with history of CAD presents for cardiology followup.  She has been seen by Dr. Gala Romney in the past and is seen by me for the first time today.  She had an MI in 5/08 with PCI to ramus and RCA.  Last myoview in 8/09 was normal.  She has a chronic pattern of occasional nonexertional chest pain that has not changed.  She does report increased exertional dyspnea for several months.  She is short of breath walking up a flight of steps or up a hill.  No dyspnea on flat ground, no chest pain with exertion.  She feels fatigued in general.  She has had bilateral foot aching and burning chronically that has been attributed to to peripheral neuropathy.    ECG: NSR, old ASMI.   Labs (8/12): TGs 112, LDL 81, HDL 52  PMH: 1. CAD: MI with PCI to ramus and RCA in 5/08 ([facility]).  Myoview in 8/09 showed EF 78%, no evidence for ischemia or infarction.  2. Hyperlipidemia: Has been intolerant to multiple statins but is tolerating low dose of Crestor.  3. PUD with h/o GI bleed 4. Chronic low back pain.  5. Peripheral neuropathy  SH: Lives with husband in [place], nonsmoker.   FH: Noncontributory.   ROS: All systems reviewed and negative except as per HPI.   Current Outpatient Prescriptions  Medication Sig Dispense Refill  . amLODipine (NORVASC) 10 MG tablet TAKE ONE TABLET BY MOUTH EVERY DAY  90 tablet  3  . aspirin 81 MG EC tablet Take 81 mg by mouth daily.        . carvedilol (COREG) 6.25 MG tablet TAKE ONE TABLET BY MOUTH TWICE DAILY  180 tablet  3  . CRESTOR 5 MG tablet TAKE ONE TABLET BY MOUTH EVERY DAY  90 each  3  . Fish Oil OIL Take 2 capsules by mouth daily.        Marland Kitchen gabapentin (NEURONTIN) 300 MG capsule TAKE ONE CAPSULE BY MOUTH THREE TIMES DAILY  270 capsule  0  . lisinopril (PRINIVIL,ZESTRIL) 20 MG tablet TAKE ONE TABLET BY MOUTH EVERY DAY  90 tablet  3  . mometasone (NASONEX) 50 MCG/ACT nasal spray Place 2 sprays into the nose daily as needed.  17 g  11  . Multiple  Vitamin (MULTIVITAMIN) capsule Take 1 capsule by mouth daily.        . nitroGLYCERIN (NITROSTAT) 0.4 MG SL tablet Place 1 tablet (0.4 mg total) under the tongue every 5 (five) minutes as needed for chest pain.  25 tablet  3    BP 108/65  Pulse 58  Ht 5\' 1"  (1.549 m)  Wt 56.133 kg (123 lb 12 oz)  BMI 23.38 kg/m2 General: NAD Neck: No JVD, no thyromegaly or thyroid nodule.  Lungs: Clear to auscultation bilaterally with normal respiratory effort. CV: Nondisplaced PMI.  Heart regular S1/S2, no S3/S4, 2/6 early SEM RUSB.  No peripheral edema.  No carotid bruit.  Normal pedal pulses.  Abdomen: Soft, nontender, no hepatosplenomegaly, no distention.  Neurologic: Alert and oriented x 3.  Psych: Normal affect. Extremities: No clubbing or cyanosis.

## 2011-12-01 ENCOUNTER — Other Ambulatory Visit (INDEPENDENT_AMBULATORY_CARE_PROVIDER_SITE_OTHER): Payer: Medicare Other | Admitting: *Deleted

## 2011-12-01 DIAGNOSIS — R0602 Shortness of breath: Secondary | ICD-10-CM

## 2011-12-01 DIAGNOSIS — I2510 Atherosclerotic heart disease of native coronary artery without angina pectoris: Secondary | ICD-10-CM

## 2012-02-12 ENCOUNTER — Ambulatory Visit (INDEPENDENT_AMBULATORY_CARE_PROVIDER_SITE_OTHER): Payer: Medicare Other | Admitting: Internal Medicine

## 2012-02-12 ENCOUNTER — Encounter: Payer: Self-pay | Admitting: Internal Medicine

## 2012-02-12 DIAGNOSIS — I10 Essential (primary) hypertension: Secondary | ICD-10-CM

## 2012-02-12 DIAGNOSIS — I2510 Atherosclerotic heart disease of native coronary artery without angina pectoris: Secondary | ICD-10-CM

## 2012-02-12 DIAGNOSIS — Z733 Stress, not elsewhere classified: Secondary | ICD-10-CM

## 2012-02-12 DIAGNOSIS — E785 Hyperlipidemia, unspecified: Secondary | ICD-10-CM

## 2012-02-12 DIAGNOSIS — F439 Reaction to severe stress, unspecified: Secondary | ICD-10-CM | POA: Insufficient documentation

## 2012-02-12 NOTE — Assessment & Plan Note (Signed)
BP Readings from Last 3 Encounters:  02/12/12 138/70  11/22/11 108/65  08/16/11 129/52   Good control Lab Results  Component Value Date   CREATININE 0.98 06/07/2011

## 2012-02-12 NOTE — Assessment & Plan Note (Signed)
Lab Results  Component Value Date   LDLCALC 81 08/16/2011   At goal Recheck next time

## 2012-02-12 NOTE — Assessment & Plan Note (Signed)
With stable anginal pattern EF okay on recent echo No changes needed

## 2012-02-12 NOTE — Assessment & Plan Note (Signed)
Stress with husband's family They control and keep all his money Counseled about this Generally he is good companion Almost at 7 year anniversary

## 2012-02-12 NOTE — Progress Notes (Signed)
Subjective:    Patient ID: Christina [name], female    DOB: 09-12-1920, 78 y.o.   MRN: [id number]  HPI Doing okay Recent visit with Dr Shirlee Latch Did have echo due to exertional dyspnea Normal EF--mild valvular insufficiency  Larey Seat into bathtub 1/7 May have slipped on too long pajama bottoms Still with some left flank pain Has changed her pajamas now No other falls Still does yard work  Stable angina---chest pain and DOE Rarely uses the NTG though--usually better with rest  Son Christina [name] is living with him She hopes he is off drugs now He has been very helpful around the house so her husband now likes him  Current Outpatient Prescriptions on File Prior to Visit  Medication Sig Dispense Refill  . amLODipine (NORVASC) 10 MG tablet TAKE ONE TABLET BY MOUTH EVERY DAY  90 tablet  3  . aspirin 81 MG EC tablet Take 81 mg by mouth daily.        . carvedilol (COREG) 6.25 MG tablet TAKE ONE TABLET BY MOUTH TWICE DAILY  180 tablet  3  . CRESTOR 5 MG tablet TAKE ONE TABLET BY MOUTH EVERY DAY  90 each  3  . Fish Oil OIL Take 2 capsules by mouth daily.        Marland Kitchen gabapentin (NEURONTIN) 300 MG capsule TAKE ONE CAPSULE BY MOUTH THREE TIMES DAILY  270 capsule  0  . lisinopril (PRINIVIL,ZESTRIL) 20 MG tablet TAKE ONE TABLET BY MOUTH EVERY DAY  90 tablet  3  . mometasone (NASONEX) 50 MCG/ACT nasal spray Place 2 sprays into the nose daily as needed.  17 g  11  . Multiple Vitamin (MULTIVITAMIN) capsule Take 1 capsule by mouth daily.        . nitroGLYCERIN (NITROSTAT) 0.4 MG SL tablet Place 1 tablet (0.4 mg total) under the tongue every 5 (five) minutes as needed for chest pain.  25 tablet  3    Allergies  Allergen Reactions  . Atorvastatin     REACTION: blisters on feet  . Diltiazem Hcl   . Gemfibrozil   . Niacin   . Sulfasalazine Rash    Past Medical History  Diagnosis Date  . CAD (coronary artery disease)     S/P previous MI; stenting of the ramus branch and RCAA with the drug-eluting stent in May  2008; myoview 8/09: EF 78%, normal perfusion  . Hypertension   . Osteoporosis   . Peripheral vascular disease   . Hyperlipidemia     Intolerant of many statins  . Chronic shoulder pain   . Chronic back pain   . Osteoarthritis   . Neuropathy     Past Surgical History  Procedure Date  . Carotid u/s 10/2006    0-39% bilaterally  . Vesicovaginal fistula closure w/ tah   . Coronary stent placement 2002    MI, [facility]  . Coronary angioplasty 12/2002    Medical rx only  . Breast biopsy     Bilateral  . Nm myoview ltd 11/2004    Stress, negative; NL EF  . Cp admit 09/2005    Stress myoview negative  . Coronary angioplasty 01/2006    LAD/PAD disease  . Adenosine myoview 01/2006    No ischemia, EF 70%  . Cataract extraction 09/2006    Right  . Coronary angioplasty 03/2007    3 vessel disease  . Coronary stent placement 04/2007    RCA/LCX multiple, [facility]  . Femoral hernia repair 05/2007    Incarcerated, right  .  Fracture surgery 03/2009    Left leg    Family History  Problem Relation Age of Onset  . Pulmonary embolism Mother   . Heart disease Father   . Heart disease Sister   . Cancer Brother   . Heart disease Sister     History   Social History  . Marital Status: Widowed    Spouse Name: N/A    Number of Children: 3  . Years of Education: N/A   Occupational History  . Not on file.   Social History Main Topics  . Smoking status: Never Smoker   . Smokeless tobacco: Never Used  . Alcohol Use: No  . Drug Use: No  . Sexually Active: Not on file   Other Topics Concern  . Not on file   Social History Narrative   Widowed 2005 after long time care for husband after strokeRemarriedHas 3 children   Review of Systems Sleeps okay Appetite is fine Weight is up slightly    Objective:   Physical Exam  Constitutional: She appears well-developed and well-nourished. No distress.  Neck: Normal range of motion. Neck supple. No thyromegaly present.  Cardiovascular:  Normal rate, regular rhythm and normal heart sounds.  Exam reveals no gallop.   No murmur heard. Pulmonary/Chest: Effort normal and breath sounds normal. No respiratory distress. She has no wheezes. She has no rales.  Abdominal: Soft. There is no tenderness.  Musculoskeletal: She exhibits no edema and no tenderness.  Lymphadenopathy:    She has no cervical adenopathy.  Psychiatric: She has a normal mood and affect. Her behavior is normal. Judgment and thought content normal.          Assessment & Plan:

## 2012-02-13 ENCOUNTER — Other Ambulatory Visit: Payer: Self-pay | Admitting: Internal Medicine

## 2012-05-30 ENCOUNTER — Ambulatory Visit (INDEPENDENT_AMBULATORY_CARE_PROVIDER_SITE_OTHER): Payer: Medicare Other | Admitting: Cardiovascular Disease

## 2012-05-30 ENCOUNTER — Encounter: Payer: Self-pay | Admitting: Cardiovascular Disease

## 2012-05-30 VITALS — BP 124/62 | HR 59 | Ht 61.0 in | Wt 128.8 lb

## 2012-05-30 DIAGNOSIS — E785 Hyperlipidemia, unspecified: Secondary | ICD-10-CM

## 2012-05-30 DIAGNOSIS — R0602 Shortness of breath: Secondary | ICD-10-CM

## 2012-05-30 DIAGNOSIS — R0989 Other specified symptoms and signs involving the circulatory and respiratory systems: Secondary | ICD-10-CM

## 2012-05-30 DIAGNOSIS — R079 Chest pain, unspecified: Secondary | ICD-10-CM

## 2012-05-30 DIAGNOSIS — I2510 Atherosclerotic heart disease of native coronary artery without angina pectoris: Secondary | ICD-10-CM

## 2012-05-30 DIAGNOSIS — I10 Essential (primary) hypertension: Secondary | ICD-10-CM

## 2012-05-30 NOTE — Assessment & Plan Note (Signed)
Echocardiogram showed normal LV systolic function with mild aortic and mitral regurgitation. This is likely multifactorial.

## 2012-05-30 NOTE — Assessment & Plan Note (Signed)
The patient has known history of coronary artery disease. She has chronic chest pain which occasionally requires nitroglycerin. The pattern does not seem to be worse since her last visit. I discussed management options with her including proceeding with a nuclear stress test for further evaluation. The patient prefers to be conservative. Would keep a close eye on her symptoms for now. Continue medical therapy.

## 2012-05-30 NOTE — Patient Instructions (Addendum)
Decrease Amlodipine to 1/2 tablet (5 mg) once daily .  Follow up in 6 months.

## 2012-05-30 NOTE — Assessment & Plan Note (Signed)
Her blood pressure is well controlled. However, she is complaining of chronic fatigue. She also has lower extremity edema. Thus, I will decrease the dose of amlodipine to 5 mg once daily.

## 2012-05-30 NOTE — Assessment & Plan Note (Signed)
Lab Results  Component Value Date   CHOL 156 08/16/2011   HDL 52.20 08/16/2011   LDLCALC 81 08/16/2011   LDLDIRECT 77.8 01/28/2010   TRIG 112.0 08/16/2011   CHOLHDL 3 08/16/2011   Her most recent lipid profile is reasonable. Continue small dose Crestor.

## 2012-05-30 NOTE — Progress Notes (Signed)
HPI  78 yo with history of CAD presents for cardiology followup. She has been seen by Dr. Gala Romney and Dr. Shirlee Latch in the past and is seen by me for the first time today. She had an MI in 5/08 with PCI to ramus and RCA. Last myoview in 8/09 was normal. She has a chronic pattern of occasional nonexertional chest pain which requires occasional use of sublingual nitroglycerin. This pattern has not changed. She does report increased exertional dyspnea for several months. She is short of breath walking up a flight of steps or up a hill. No dyspnea on flat ground, no chest pain with exertion. She feels fatigued in general. She has had bilateral foot aching and burning chronically that has been attributed to to peripheral neuropathy.  She had an echocardiogram done late last year which showed normal LV systolic function with mild mitral and aortic insufficiency.   Allergies  Allergen Reactions  . Atorvastatin     REACTION: blisters on feet  . Diltiazem Hcl   . Gemfibrozil   . Niacin   . Sulfasalazine Rash     Current Outpatient Prescriptions on File Prior to Visit  Medication Sig Dispense Refill  . amLODipine (NORVASC) 10 MG tablet TAKE ONE TABLET BY MOUTH EVERY DAY  90 tablet  3  . aspirin 81 MG EC tablet Take 81 mg by mouth daily.        . carvedilol (COREG) 6.25 MG tablet TAKE ONE TABLET BY MOUTH TWICE DAILY  180 tablet  3  . CRESTOR 5 MG tablet TAKE ONE TABLET BY MOUTH EVERY DAY  90 each  3  . Fish Oil OIL Take 2 capsules by mouth daily.        Marland Kitchen gabapentin (NEURONTIN) 300 MG capsule TAKE ONE CAPSULE BY MOUTH THREE TIMES DAILY  270 capsule  0  . lisinopril (PRINIVIL,ZESTRIL) 20 MG tablet TAKE ONE TABLET BY MOUTH EVERY DAY  90 tablet  3  . mometasone (NASONEX) 50 MCG/ACT nasal spray Place 2 sprays into the nose daily as needed.  17 g  11  . Multiple Vitamin (MULTIVITAMIN) capsule Take 1 capsule by mouth daily.        . nitroGLYCERIN (NITROSTAT) 0.4 MG SL tablet Place 1 tablet (0.4 mg  total) under the tongue every 5 (five) minutes as needed for chest pain.  25 tablet  3     Past Medical History  Diagnosis Date  . CAD (coronary artery disease)     S/P previous MI; stenting of the ramus branch and RCAA with the drug-eluting stent in May 2008; myoview 8/09: EF 78%, normal perfusion  . Hypertension   . Osteoporosis   . Peripheral vascular disease   . Hyperlipidemia     Intolerant of many statins  . Chronic shoulder pain   . Chronic back pain   . Osteoarthritis   . Neuropathy      Past Surgical History  Procedure Date  . Carotid u/s 10/2006    0-39% bilaterally  . Vesicovaginal fistula closure w/ tah   . Coronary stent placement 2002    MI, [facility]  . Coronary angioplasty 12/2002    Medical rx only  . Breast biopsy     Bilateral  . Nm myoview ltd 11/2004    Stress, negative; NL EF  . Cp admit 09/2005    Stress myoview negative  . Coronary angioplasty 01/2006    LAD/PAD disease  . Adenosine myoview 01/2006    No ischemia, EF  70%  . Cataract extraction 09/2006    Right  . Coronary angioplasty 03/2007    3 vessel disease  . Coronary stent placement 04/2007    RCA/LCX multiple, [facility]  . Femoral hernia repair 05/2007    Incarcerated, right  . Fracture surgery 03/2009    Left leg     Family History  Problem Relation Age of Onset  . Pulmonary embolism Mother   . Heart disease Father   . Heart disease Sister   . Cancer Brother   . Heart disease Sister      History   Social History  . Marital Status: Widowed    Spouse Name: N/A    Number of Children: 3  . Years of Education: N/A   Occupational History  . Not on file.   Social History Main Topics  . Smoking status: Never Smoker   . Smokeless tobacco: Never Used  . Alcohol Use: No  . Drug Use: No  . Sexually Active: Not on file   Other Topics Concern  . Not on file   Social History Narrative   Widowed 2005 after long time care for husband after strokeRemarriedHas 3 children      PHYSICAL EXAM   BP 124/62  Pulse 59  Ht 5\' 1"  (1.549 m)  Wt 128 lb 12 oz (58.401 kg)  BMI 24.33 kg/m2 Constitutional: She is oriented to person, place, and time. She appears well-developed and well-nourished. No distress.  HENT: No nasal discharge.  Head: Normocephalic and atraumatic.  Eyes: Pupils are equal and round. Right eye exhibits no discharge. Left eye exhibits no discharge.  Neck: Normal range of motion. Neck supple. No JVD present. No thyromegaly present.  Cardiovascular: Normal rate, regular rhythm, normal heart sounds. Exam reveals no gallop and no friction rub. No murmur heard.  Pulmonary/Chest: Effort normal and breath sounds normal. No stridor. No respiratory distress. She has no wheezes. She has no rales. She exhibits no tenderness.  Abdominal: Soft. Bowel sounds are normal. She exhibits no distension. There is no tenderness. There is no rebound and no guarding.  Musculoskeletal: Normal range of motion. She exhibits +1 edema worse on the left side and no tenderness.  Neurological: She is alert and oriented to person, place, and time. Coordination normal.  Skin: Skin is warm and dry. No rash noted. She is not diaphoretic. No erythema. No pallor.  Psychiatric: She has a normal mood and affect. Her behavior is normal. Judgment and thought content normal.     EKG: Sinus  Bradycardia  Poor R-wave progression in the precordial leads.   ASSESSMENT AND PLAN

## 2012-06-04 ENCOUNTER — Other Ambulatory Visit: Payer: Self-pay | Admitting: Internal Medicine

## 2012-08-12 ENCOUNTER — Ambulatory Visit: Payer: Medicare Other | Admitting: Internal Medicine

## 2012-08-16 ENCOUNTER — Ambulatory Visit (INDEPENDENT_AMBULATORY_CARE_PROVIDER_SITE_OTHER): Payer: Medicare Other | Admitting: Internal Medicine

## 2012-08-16 ENCOUNTER — Encounter: Payer: Self-pay | Admitting: Internal Medicine

## 2012-08-16 VITALS — BP 110/60 | HR 56 | Temp 98.1°F | Ht 61.0 in | Wt 123.0 lb

## 2012-08-16 DIAGNOSIS — I10 Essential (primary) hypertension: Secondary | ICD-10-CM

## 2012-08-16 DIAGNOSIS — F430 Acute stress reaction: Secondary | ICD-10-CM

## 2012-08-16 DIAGNOSIS — E785 Hyperlipidemia, unspecified: Secondary | ICD-10-CM

## 2012-08-16 DIAGNOSIS — I2510 Atherosclerotic heart disease of native coronary artery without angina pectoris: Secondary | ICD-10-CM

## 2012-08-16 DIAGNOSIS — F439 Reaction to severe stress, unspecified: Secondary | ICD-10-CM

## 2012-08-16 LAB — CBC WITH DIFFERENTIAL/PLATELET
Basophils Relative: 0.6 % (ref 0.0–3.0)
Eosinophils Absolute: 0.2 10*3/uL (ref 0.0–0.7)
Hemoglobin: 12.5 g/dL (ref 12.0–15.0)
Lymphocytes Relative: 20 % (ref 12.0–46.0)
MCHC: 32.5 g/dL (ref 30.0–36.0)
MCV: 95.1 fl (ref 78.0–100.0)
Monocytes Absolute: 0.7 10*3/uL (ref 0.1–1.0)
Neutro Abs: 4.9 10*3/uL (ref 1.4–7.7)
RBC: 4.04 Mil/uL (ref 3.87–5.11)

## 2012-08-16 LAB — BASIC METABOLIC PANEL
CO2: 27 mEq/L (ref 19–32)
Chloride: 106 mEq/L (ref 96–112)
Potassium: 4.3 mEq/L (ref 3.5–5.1)
Sodium: 140 mEq/L (ref 135–145)

## 2012-08-16 LAB — LIPID PANEL: Total CHOL/HDL Ratio: 3

## 2012-08-16 LAB — HEPATIC FUNCTION PANEL
ALT: 11 U/L (ref 0–35)
Bilirubin, Direct: 0 mg/dL (ref 0.0–0.3)
Total Protein: 7.2 g/dL (ref 6.0–8.3)

## 2012-08-16 MED ORDER — AMLODIPINE BESYLATE 5 MG PO TABS: 5.0000 mg | ORAL_TABLET | Freq: Every day | ORAL | Status: DC

## 2012-08-16 MED ORDER — CARVEDILOL 6.25 MG PO TABS: 6.2500 mg | ORAL_TABLET | Freq: Two times a day (BID) | ORAL | Status: DC

## 2012-08-16 MED ORDER — LISINOPRIL 20 MG PO TABS: 20.0000 mg | ORAL_TABLET | Freq: Every day | ORAL | Status: DC

## 2012-08-16 MED ORDER — GABAPENTIN 300 MG PO CAPS: 300.0000 mg | ORAL_CAPSULE | Freq: Three times a day (TID) | ORAL | Status: DC

## 2012-08-16 MED ORDER — ROSUVASTATIN CALCIUM 5 MG PO TABS: 5.0000 mg | ORAL_TABLET | Freq: Every day | ORAL | Status: DC

## 2012-08-16 NOTE — Assessment & Plan Note (Signed)
Not having angina of late Echo recently was okay

## 2012-08-16 NOTE — Assessment & Plan Note (Signed)
BP Readings from Last 3 Encounters:  08/16/12 110/60  05/30/12 124/62  02/12/12 138/70   BP is fine despite decreased amlodipine

## 2012-08-16 NOTE — Assessment & Plan Note (Signed)
Severe with husband's daughter pulling him out of her house Counseled----can't expect him to pull away from this overbearing daughter

## 2012-08-16 NOTE — Progress Notes (Signed)
Subjective:    Patient ID: Christina [name], female    DOB: [birth year], 78 y.o.   MRN: [id number]  HPI Doing okay Stress though----husband left her in June. His family concerned they couldn't take care of each other with increased age. Seems to have ongoing friction with his daughter (he never contributed money to the household) He is now living with daughter but comes over to visit fairly regularly Son [name] is still living with her. Not sure if he is off drugs but he is very helpful to her.  Still has some back pain Pain in right groin continues---has knot there  Not really having chest pain Breathing is okay Edema better since amlodipine dose cut in half  Some scattered arthritic pain---esp in fingers No clear myalgias No stomach trouble Current Outpatient Prescriptions on File Prior to Visit  Medication Sig Dispense Refill  . aspirin 81 MG EC tablet Take 81 mg by mouth daily.        . Fish Oil OIL Take 2 capsules by mouth daily.        . mometasone (NASONEX) 50 MCG/ACT nasal spray Place 2 sprays into the nose daily as needed.  17 g  11  . Multiple Vitamin (MULTIVITAMIN) capsule Take 1 capsule by mouth daily.        . nitroGLYCERIN (NITROSTAT) 0.4 MG SL tablet Place 1 tablet (0.4 mg total) under the tongue every 5 (five) minutes as needed for chest pain.  25 tablet  3  . DISCONTD: carvedilol (COREG) 6.25 MG tablet TAKE ONE TABLET BY MOUTH TWICE DAILY  180 tablet  0  . DISCONTD: CRESTOR 5 MG tablet TAKE ONE TABLET BY MOUTH EVERY DAY  90 each  3  . DISCONTD: gabapentin (NEURONTIN) 300 MG capsule TAKE ONE CAPSULE BY MOUTH THREE TIMES DAILY  270 capsule  0  . DISCONTD: lisinopril (PRINIVIL,ZESTRIL) 20 MG tablet TAKE ONE TABLET BY MOUTH EVERY DAY  90 tablet  3  . DISCONTD: amLODipine (NORVASC) 10 MG tablet TAKE ONE TABLET BY MOUTH EVERY DAY  90 tablet  3    Allergies  Allergen Reactions  . Atorvastatin     REACTION: blisters on feet  . Diltiazem Hcl   . Gemfibrozil   . Niacin   .  Sulfasalazine Rash    Past Medical History  Diagnosis Date  . CAD (coronary artery disease)     S/P previous MI; stenting of the ramus branch and RCAA with the drug-eluting stent in May 2008; myoview 8/09: EF 78%, normal perfusion  . Hypertension   . Osteoporosis   . Peripheral vascular disease   . Hyperlipidemia     Intolerant of many statins  . Chronic shoulder pain   . Chronic back pain   . Osteoarthritis   . Neuropathy     Past Surgical History  Procedure Date  . Carotid u/s 10/2006    0-39% bilaterally  . Vesicovaginal fistula closure w/ tah   . Coronary stent placement 2002    MI, [facility]  . Coronary angioplasty 12/2002    Medical rx only  . Breast biopsy     Bilateral  . Nm myoview ltd 11/2004    Stress, negative; NL EF  . Cp admit 09/2005    Stress myoview negative  . Coronary angioplasty 01/2006    LAD/PAD disease  . Adenosine myoview 01/2006    No ischemia, EF 70%  . Cataract extraction 09/2006    Right  . Coronary angioplasty 03/2007  3 vessel disease  . Coronary stent placement 04/2007    RCA/LCX multiple, [facility]  . Femoral hernia repair 05/2007    Incarcerated, right  . Fracture surgery 03/2009    Left leg    Family History  Problem Relation Age of Onset  . Pulmonary embolism Mother   . Heart disease Father   . Heart disease Sister   . Cancer Brother   . Heart disease Sister     History   Social History  . Marital Status: Widowed    Spouse Name: N/A    Number of Children: 3  . Years of Education: N/A   Occupational History  . Not on file.   Social History Main Topics  . Smoking status: Never Smoker   . Smokeless tobacco: Never Used  . Alcohol Use: No  . Drug Use: No  . Sexually Active: Not on file   Other Topics Concern  . Not on file   Social History Narrative   Widowed 2005 after long time care for husband after strokeRemarriedHas 3 children     Review of Systems Appetite is off some with the stress Weight is down  5# Not sleeping that well--clearly is lonely now    Objective:   Physical Exam  Constitutional: She appears well-developed and well-nourished. No distress.  Neck: Normal range of motion. Neck supple. No thyromegaly present.  Cardiovascular: Normal rate, regular rhythm and normal heart sounds.  Exam reveals no gallop.   No murmur heard. Pulmonary/Chest: Effort normal and breath sounds normal. No respiratory distress. She has no wheezes. She has no rales.  Abdominal: Soft. There is no tenderness.  Musculoskeletal: She exhibits no edema and no tenderness.  Lymphadenopathy:    She has no cervical adenopathy.  Psychiatric: Her behavior is normal. Thought content normal.       Mild sadness but good insight and appropriate affect          Assessment & Plan:

## 2012-08-16 NOTE — Assessment & Plan Note (Signed)
No problems with med Will check labs 

## 2012-08-22 ENCOUNTER — Encounter: Payer: Self-pay | Admitting: *Deleted

## 2012-12-16 ENCOUNTER — Ambulatory Visit (INDEPENDENT_AMBULATORY_CARE_PROVIDER_SITE_OTHER): Payer: Medicare Other | Admitting: Nurse Practitioner

## 2012-12-16 ENCOUNTER — Encounter: Payer: Self-pay | Admitting: Nurse Practitioner

## 2012-12-16 VITALS — BP 148/60 | HR 63 | Ht 61.5 in | Wt 122.5 lb

## 2012-12-16 DIAGNOSIS — R0989 Other specified symptoms and signs involving the circulatory and respiratory systems: Secondary | ICD-10-CM | POA: Insufficient documentation

## 2012-12-16 DIAGNOSIS — I2510 Atherosclerotic heart disease of native coronary artery without angina pectoris: Secondary | ICD-10-CM

## 2012-12-16 DIAGNOSIS — E785 Hyperlipidemia, unspecified: Secondary | ICD-10-CM

## 2012-12-16 DIAGNOSIS — R0602 Shortness of breath: Secondary | ICD-10-CM

## 2012-12-16 DIAGNOSIS — I200 Unstable angina: Secondary | ICD-10-CM

## 2012-12-16 DIAGNOSIS — I10 Essential (primary) hypertension: Secondary | ICD-10-CM

## 2012-12-16 NOTE — Patient Instructions (Addendum)
Your physician has requested that you have a lexiscan myoview. For further information please visit [email or web address] Please follow instruction sheet, as given.  Your physician has requested that you have a carotid duplex. This test is an ultrasound of the carotid arteries in your neck. It looks at blood flow through these arteries that supply the brain with blood. Allow one hour for this exam. There are no restrictions or special instructions.  Your physician wants you to follow-up in: 1 month with Dr. Kirke Corin. You will receive a reminder letter in the mail two months in advance. If you don't receive a letter, please call our office to schedule the follow-up appointment.  [facility] MYOVIEW  Your caregiver has ordered a Stress Test with nuclear imaging. The purpose of this test is to evaluate the blood supply to your heart muscle. This procedure is referred to as a "Non-Invasive Stress Test." This is because other than having an IV started in your vein, nothing is inserted or "invades" your body. Cardiac stress tests are done to find areas of poor blood flow to the heart by determining the extent of coronary artery disease (CAD). Some patients exercise on a treadmill, which naturally increases the blood flow to your heart, while others who are  unable to walk on a treadmill due to physical limitations have a pharmacologic/chemical stress agent called Lexiscan . This medicine will mimic walking on a treadmill by temporarily increasing your coronary blood flow.   Please note: these test may take anywhere between 2-4 hours to complete  PLEASE REPORT TO [facility] ENTRANCE  THE VOLUNTEERS AT THE FIRST DESK WILL DIRECT YOU WHERE TO GO  Date of Procedure:__1/6/14___________________________________  Arrival Time for Procedure:____7:45 am__________________________  Instructions regarding medication:   ____ : Hold diabetes medication morning of procedure  __x__:  Hold betablocker(s) night before  procedure and morning of procedure (Carvedilol/Coreg)  ____:  Hold other medications as follows:_________________________________________________________________________________________________________________________________________________________________________________________________________________________________________________________________________________________  PLEASE NOTIFY THE OFFICE AT LEAST 24 HOURS IN ADVANCE IF YOU ARE UNABLE TO KEEP YOUR APPOINTMENT.  [phone]  How to prepare for your Myoview test:  1. Do not eat or drink after midnight 2. No caffeine for 24 hours prior to test 3. Your medication may be taken with water.  If your doctor stopped a medication because of this test, do not take that medication. 4. Ladies, please do not wear dresses.  Skirts or pants are appropriate. Please wear a short sleeve shirt. 5. No perfume, cologne or lotion. 6. Wear comfortable walking shoes. No heels!

## 2012-12-16 NOTE — Progress Notes (Signed)
Patient Name: Christina [name] Date of Encounter: 12/16/2012  Primary Care Provider:  Tillman Abide, MD Primary Cardiologist:  Judie Petit. Kirke Corin, MD  Patient Profile  78 year old female with history of coronary disease and somewhat chronic chest discomfort who presents for followup.  Problem List   Past Medical History  Diagnosis Date  . CAD (coronary artery disease)     a. S/P previous MI;  b. 04/2007 Cath/PCI: Taxus DES' to mid/distal RCA and RPDA as well as Ramus;  c. myoview 8/09: EF 78%, normal perfusion  . Hypertension   . Osteoporosis   . Peripheral vascular disease   . Hyperlipidemia     Intolerant of many statins  . Chronic shoulder pain   . Chronic back pain   . Osteoarthritis   . Neuropathy   . Mild mitral and aortic regurgitation     a. 11/2011 Echo: EF 55-65%, No RWMA, Gr 1 DD, Mild AI/MR.   Past Surgical History  Procedure Date  . Carotid u/s 10/2006    0-39% bilaterally  . Vesicovaginal fistula closure w/ tah   . Coronary stent placement 2002    MI, [facility]  . Coronary angioplasty 12/2002    Medical rx only  . Breast biopsy     Bilateral  . Nm myoview ltd 11/2004    Stress, negative; NL EF  . Cp admit 09/2005    Stress myoview negative  . Coronary angioplasty 01/2006    LAD/PAD disease  . Adenosine myoview 01/2006    No ischemia, EF 70%  . Cataract extraction 09/2006    Right  . Coronary angioplasty 03/2007    3 vessel disease  . Coronary stent placement 04/2007    RCA/LCX multiple, [facility]  . Femoral hernia repair 05/2007    Incarcerated, right  . Fracture surgery 03/2009    Left leg    Allergies  Allergies  Allergen Reactions  . Atorvastatin     REACTION: blisters on feet  . Diltiazem Hcl   . Gemfibrozil   . Niacin   . Sulfasalazine Rash    HPI  78 year old female with above problem list.  She was last seen in clinic over the summer.  At that time she was having intermittent chest arm and back discomfort.  She was offered a stress test but  deferred at the time.  Since then, she is continued to have intermittent scapular, lower back, right arm, and chest discomfort.  She says it spell happens maybe once a month or less but usually when it occurs, it is similar to prior anginal symptoms and she does take nitroglycerin with eventual relief.  In all, the frequency of symptoms has not changed over a year or more but she is now interested in undergoing stress testing.  She denies PND, orthopnea, dizziness, syncope, edema, or early satiety.  She is active and walks her dog once or twice a day generally without symptoms of chest or back discomfort.  She has noted some weakness with prolonged activities.  Home Medications  Prior to Admission medications   Medication Sig Start Date End Date Taking? Authorizing Provider  amLODipine (NORVASC) 5 MG tablet Take 1 tablet (5 mg total) by mouth daily. 08/16/12  Yes Karie Schwalbe, MD  aspirin 81 MG EC tablet Take 81 mg by mouth daily.     Yes Historical Provider, MD  carvedilol (COREG) 6.25 MG tablet Take 1 tablet (6.25 mg total) by mouth 2 (two) times daily with a meal. 08/16/12  Yes Richard  Clayborn Bigness, MD  Fish Oil OIL Take 2 capsules by mouth daily.     Yes Historical Provider, MD  gabapentin (NEURONTIN) 300 MG capsule Take 1 capsule (300 mg total) by mouth 3 (three) times daily. 08/16/12  Yes Karie Schwalbe, MD  lisinopril (PRINIVIL,ZESTRIL) 20 MG tablet Take 1 tablet (20 mg total) by mouth daily. 08/16/12  Yes Karie Schwalbe, MD  mometasone (NASONEX) 50 MCG/ACT nasal spray Place 2 sprays into the nose daily as needed. 08/16/11  Yes Karie Schwalbe, MD  Multiple Vitamin (MULTIVITAMIN) capsule Take 1 capsule by mouth daily.     Yes Historical Provider, MD  nitroGLYCERIN (NITROSTAT) 0.4 MG SL tablet Place 1 tablet (0.4 mg total) under the tongue every 5 (five) minutes as needed for chest pain. 08/16/11  Yes Karie Schwalbe, MD  rosuvastatin (CRESTOR) 5 MG tablet Take 1 tablet (5 mg total) by mouth  daily. 08/16/12  Yes Karie Schwalbe, MD    Review of Systems  Weakness with prolonged activities as outlined above.  Chest and back discomfort as outlined above.  All other systems reviewed and are otherwise negative except as noted above.  Physical Exam  Blood pressure 148/60, pulse 63, height 5' 1.5" (1.562 m), weight 122 lb 8 oz (55.566 kg).  General: Pleasant, NAD Psych: Normal affect. Neuro: Alert and oriented X 3. Moves all extremities spontaneously. HEENT: Normal  Neck: Supple without JVD.  She has soft bilateral carotid bruits. Lungs:  Resp regular and unlabored, CTA. Heart: RRR no s3, s4, or murmurs. Abdomen: Soft, non-tender, non-distended, BS + x 4.  Extremities: No clubbing, cyanosis or edema. DP/PT/Radials 2+ and equal bilaterally.  Accessory Clinical Findings  ECG - rsr, 63, no acute st/t changes.  Assessment & Plan  1.  Unstable angina/CAD: Patient continues to have intermittent chest discomfort concerning for angina.  This is nitrate responsive.  Whereas she was previously managed and stress testing she now is interested.  We will arrange for a lexiscan Myoview to assess for ischemia.  Cont current meds.  2.  Carotid bruits:  Soft bilat carotid bruits noted on exam while breath held.  No prior u/s.  Will order.  3.  HTN:  BP mildly elevated today.  Asked her to follow daily @ home and record.  Cont current meds for now.  4.  HL:  On crestor.  5.  Dispo:  F/u Dr. Kirke Corin in 1 month following myoview and carotid u/s.  Nicolasa Ducking, NP 12/16/2012, 3:37 PM

## 2012-12-17 ENCOUNTER — Ambulatory Visit: Payer: Medicare Other | Admitting: Cardiovascular Disease

## 2012-12-23 ENCOUNTER — Ambulatory Visit: Payer: Self-pay | Admitting: Cardiovascular Disease

## 2012-12-23 DIAGNOSIS — R079 Chest pain, unspecified: Secondary | ICD-10-CM

## 2012-12-25 ENCOUNTER — Other Ambulatory Visit: Payer: Self-pay | Admitting: *Deleted

## 2012-12-25 DIAGNOSIS — R0989 Other specified symptoms and signs involving the circulatory and respiratory systems: Secondary | ICD-10-CM

## 2012-12-25 DIAGNOSIS — R0602 Shortness of breath: Secondary | ICD-10-CM

## 2012-12-25 DIAGNOSIS — I10 Essential (primary) hypertension: Secondary | ICD-10-CM

## 2012-12-25 DIAGNOSIS — I2510 Atherosclerotic heart disease of native coronary artery without angina pectoris: Secondary | ICD-10-CM

## 2012-12-25 DIAGNOSIS — E785 Hyperlipidemia, unspecified: Secondary | ICD-10-CM

## 2012-12-25 DIAGNOSIS — I200 Unstable angina: Secondary | ICD-10-CM

## 2013-01-02 ENCOUNTER — Encounter (INDEPENDENT_AMBULATORY_CARE_PROVIDER_SITE_OTHER): Payer: Medicare Other

## 2013-01-02 DIAGNOSIS — E785 Hyperlipidemia, unspecified: Secondary | ICD-10-CM

## 2013-01-02 DIAGNOSIS — I2510 Atherosclerotic heart disease of native coronary artery without angina pectoris: Secondary | ICD-10-CM

## 2013-01-02 DIAGNOSIS — I10 Essential (primary) hypertension: Secondary | ICD-10-CM

## 2013-01-02 DIAGNOSIS — I200 Unstable angina: Secondary | ICD-10-CM

## 2013-01-02 DIAGNOSIS — I6529 Occlusion and stenosis of unspecified carotid artery: Secondary | ICD-10-CM

## 2013-01-02 DIAGNOSIS — R0602 Shortness of breath: Secondary | ICD-10-CM

## 2013-01-02 DIAGNOSIS — R0989 Other specified symptoms and signs involving the circulatory and respiratory systems: Secondary | ICD-10-CM

## 2013-01-03 ENCOUNTER — Other Ambulatory Visit: Payer: Self-pay | Admitting: Internal Medicine

## 2013-01-06 ENCOUNTER — Other Ambulatory Visit: Payer: Self-pay

## 2013-01-06 DIAGNOSIS — I6529 Occlusion and stenosis of unspecified carotid artery: Secondary | ICD-10-CM

## 2013-01-06 NOTE — Telephone Encounter (Signed)
Okay to refill for 6 months She is due for a follow up in 2 months or so---have her schedule

## 2013-01-06 NOTE — Telephone Encounter (Signed)
Ok to fill 

## 2013-01-06 NOTE — Telephone Encounter (Signed)
rx sent to pharmacy by e-script Pt has appt scheduled already

## 2013-01-16 ENCOUNTER — Ambulatory Visit (INDEPENDENT_AMBULATORY_CARE_PROVIDER_SITE_OTHER): Payer: Medicare Other | Admitting: Cardiovascular Disease

## 2013-01-16 ENCOUNTER — Encounter: Payer: Self-pay | Admitting: Cardiovascular Disease

## 2013-01-16 VITALS — BP 130/62 | HR 62 | Ht 61.5 in | Wt 123.0 lb

## 2013-01-16 DIAGNOSIS — I10 Essential (primary) hypertension: Secondary | ICD-10-CM

## 2013-01-16 DIAGNOSIS — I2510 Atherosclerotic heart disease of native coronary artery without angina pectoris: Secondary | ICD-10-CM

## 2013-01-16 DIAGNOSIS — E785 Hyperlipidemia, unspecified: Secondary | ICD-10-CM

## 2013-01-16 DIAGNOSIS — I6529 Occlusion and stenosis of unspecified carotid artery: Secondary | ICD-10-CM

## 2013-01-16 NOTE — Progress Notes (Signed)
HPI  79 yo with history of CAD presents for cardiology followup. She had an MI in 5/08 with PCI to ramus and RCA. Last myoview in 8/09 was normal. She has a chronic pattern of occasional nonexertional chest pain which requires occasional use of sublingual nitroglycerin. She had an echocardiogram done late last year which showed normal LV systolic function with mild mitral and aortic insufficiency. She was seen recently by Christain Sacramento for increased chest pain and dyspnea. She underwent a pharmacologic nuclear stress test which showed no evidence of ischemia with normal ejection fraction. There was a fixed basal lateral wall defect likely due to a prior small infarct. She was noted to have faint carotid bruits. Carotid duplex showed mild to moderate disease bilaterally. She reports no further chest pain. She continues to be active and independent.    Allergies  Allergen Reactions  . Atorvastatin     REACTION: blisters on feet  . Diltiazem Hcl   . Gemfibrozil   . Niacin   . Sulfasalazine Rash     Current Outpatient Prescriptions on File Prior to Visit  Medication Sig Dispense Refill  . amLODipine (NORVASC) 5 MG tablet Take 1 tablet (5 mg total) by mouth daily.  90 tablet  3  . aspirin 81 MG EC tablet Take 81 mg by mouth daily.        . carvedilol (COREG) 6.25 MG tablet Take 1 tablet (6.25 mg total) by mouth 2 (two) times daily with a meal.  180 tablet  3  . Fish Oil OIL Take 2 capsules by mouth daily.        Marland Kitchen gabapentin (NEURONTIN) 300 MG capsule TAKE ONE CAPSULE BY MOUTH THREE TIMES DAILY  270 capsule  0  . lisinopril (PRINIVIL,ZESTRIL) 20 MG tablet Take 1 tablet (20 mg total) by mouth daily.  90 tablet  3  . mometasone (NASONEX) 50 MCG/ACT nasal spray Place 2 sprays into the nose daily as needed.  17 g  11  . Multiple Vitamin (MULTIVITAMIN) capsule Take 1 capsule by mouth daily.        . nitroGLYCERIN (NITROSTAT) 0.4 MG SL tablet Place 1 tablet (0.4 mg total) under the tongue every 5  (five) minutes as needed for chest pain.  25 tablet  3  . rosuvastatin (CRESTOR) 5 MG tablet Take 1 tablet (5 mg total) by mouth daily.  90 tablet  3     Past Medical History  Diagnosis Date  . CAD (coronary artery disease)     a. S/P previous MI;  b. 04/2007 Cath/PCI: Taxus DES' to mid/distal RCA and RPDA as well as Ramus;  c. myoview 8/09: EF 78%, normal perfusion  . Hypertension   . Osteoporosis   . Peripheral vascular disease   . Hyperlipidemia     Intolerant of many statins  . Chronic shoulder pain   . Chronic back pain   . Osteoarthritis   . Neuropathy   . Mild mitral and aortic regurgitation     a. 11/2011 Echo: EF 55-65%, No RWMA, Gr 1 DD, Mild AI/MR.     Past Surgical History  Procedure Date  . Carotid u/s 10/2006    0-39% bilaterally  . Vesicovaginal fistula closure w/ tah   . Coronary stent placement 2002    MI, [facility]  . Coronary angioplasty 12/2002    Medical rx only  . Breast biopsy     Bilateral  . Nm myoview ltd 11/2004    Stress, negative; NL EF  .  Cp admit 09/2005    Stress myoview negative  . Coronary angioplasty 01/2006    LAD/PAD disease  . Adenosine myoview 01/2006    No ischemia, EF 70%  . Cataract extraction 09/2006    Right  . Coronary angioplasty 03/2007    3 vessel disease  . Coronary stent placement 04/2007    RCA/LCX multiple, [facility]  . Femoral hernia repair 05/2007    Incarcerated, right  . Fracture surgery 03/2009    Left leg     Family History  Problem Relation Age of Onset  . Pulmonary embolism Mother   . Heart disease Father   . Heart disease Sister   . Cancer Brother   . Heart disease Sister      History   Social History  . Marital Status: Widowed    Spouse Name: N/A    Number of Children: 3  . Years of Education: N/A   Occupational History  . Not on file.   Social History Main Topics  . Smoking status: Never Smoker   . Smokeless tobacco: Never Used  . Alcohol Use: No  . Drug Use: No  . Sexually Active:  Not on file   Other Topics Concern  . Not on file   Social History Narrative   Widowed 2005 after long time care for husband after strokeRemarriedHas 3 children     PHYSICAL EXAM   BP 130/62  Pulse 62  Ht 5' 1.5" (1.562 m)  Wt 123 lb (55.792 kg)  BMI 22.86 kg/m2 Constitutional: She is oriented to person, place, and time. She appears well-developed and well-nourished. No distress.  HENT: No nasal discharge.  Head: Normocephalic and atraumatic.  Eyes: Pupils are equal and round. Right eye exhibits no discharge. Left eye exhibits no discharge.  Neck: Normal range of motion. Neck supple. No JVD present. No thyromegaly present.  Cardiovascular: Normal rate, regular rhythm, normal heart sounds. Exam reveals no gallop and no friction rub. No murmur heard.  Pulmonary/Chest: Effort normal and breath sounds normal. No stridor. No respiratory distress. She has no wheezes. She has no rales. She exhibits no tenderness.  Abdominal: Soft. Bowel sounds are normal. She exhibits no distension. There is no tenderness. There is no rebound and no guarding.  Musculoskeletal: Normal range of motion. She exhibits +1 edema worse on the left side and no tenderness.  Neurological: She is alert and oriented to person, place, and time. Coordination normal.  Skin: Skin is warm and dry. No rash noted. She is not diaphoretic. No erythema. No pallor.  Psychiatric: She has a normal mood and affect. Her behavior is normal. Judgment and thought content normal.      ASSESSMENT AND PLAN

## 2013-01-16 NOTE — Assessment & Plan Note (Signed)
Blood pressure is well controlled 

## 2013-01-16 NOTE — Assessment & Plan Note (Signed)
Continue treatment with small dose Crestor.

## 2013-01-16 NOTE — Patient Instructions (Signed)
Continue same medications.  Follow up in 6 months.  

## 2013-01-16 NOTE — Assessment & Plan Note (Addendum)
I would only recommend revascularization if he is symptomatic with a stroke or TIA given her age. Continue medical therapy. The disease was mild to moderate overall.

## 2013-01-16 NOTE — Assessment & Plan Note (Signed)
She is doing well at this time with no symptoms suggestive of angina. Continue medical therapy. Recent nuclear stress test showed no significant ischemia with normal ejection fraction.

## 2013-02-13 ENCOUNTER — Ambulatory Visit (INDEPENDENT_AMBULATORY_CARE_PROVIDER_SITE_OTHER): Payer: Medicare Other | Admitting: Internal Medicine

## 2013-02-13 ENCOUNTER — Encounter: Payer: Self-pay | Admitting: Internal Medicine

## 2013-02-13 VITALS — BP 158/70 | HR 58 | Temp 98.0°F | Wt 122.0 lb

## 2013-02-13 DIAGNOSIS — F439 Reaction to severe stress, unspecified: Secondary | ICD-10-CM

## 2013-02-13 DIAGNOSIS — Z733 Stress, not elsewhere classified: Secondary | ICD-10-CM

## 2013-02-13 DIAGNOSIS — I2510 Atherosclerotic heart disease of native coronary artery without angina pectoris: Secondary | ICD-10-CM

## 2013-02-13 DIAGNOSIS — E785 Hyperlipidemia, unspecified: Secondary | ICD-10-CM

## 2013-02-13 DIAGNOSIS — I10 Essential (primary) hypertension: Secondary | ICD-10-CM

## 2013-02-13 NOTE — Assessment & Plan Note (Signed)
BP Readings from Last 3 Encounters:  02/13/13 158/70  01/16/13 130/62  12/16/12 148/60   Reasonable control for her age No changes
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
## 2013-02-13 NOTE — Assessment & Plan Note (Signed)
Lab Results  Component Value Date   LDLCALC 82 08/16/2012   Good control Labs next time

## 2013-02-13 NOTE — Progress Notes (Signed)
Subjective:    Patient ID: Christina [name], female    DOB: 01/25/1920, 79 y.o.   MRN: [id number]  HPI Here for follow up Doing well  Still somewhat estranged from husband. She comes over every other day and calls her at night on the phone She is more accepting of how their relationship is Son [name] still lives with her  She wonders about the carotid Reviewed Dr Jari Sportsman note---no intervention for now since no neuro symptoms She worried due to pain along the right side her head  Rare chest pain Uses the NTG perhaps once or twice a month  Current Outpatient Prescriptions on File Prior to Visit  Medication Sig Dispense Refill  . aspirin 81 MG EC tablet Take 81 mg by mouth daily.        . carvedilol (COREG) 6.25 MG tablet Take 1 tablet (6.25 mg total) by mouth 2 (two) times daily with a meal.  180 tablet  3  . Fish Oil OIL Take 2 capsules by mouth daily.        Marland Kitchen gabapentin (NEURONTIN) 300 MG capsule TAKE ONE CAPSULE BY MOUTH THREE TIMES DAILY  270 capsule  0  . lisinopril (PRINIVIL,ZESTRIL) 20 MG tablet Take 1 tablet (20 mg total) by mouth daily.  90 tablet  3  . mometasone (NASONEX) 50 MCG/ACT nasal spray Place 2 sprays into the nose daily as needed.  17 g  11  . Multiple Vitamin (MULTIVITAMIN) capsule Take 1 capsule by mouth daily.        . nitroGLYCERIN (NITROSTAT) 0.4 MG SL tablet Place 1 tablet (0.4 mg total) under the tongue every 5 (five) minutes as needed for chest pain.  25 tablet  3  . rosuvastatin (CRESTOR) 5 MG tablet Take 1 tablet (5 mg total) by mouth daily.  90 tablet  3   No current facility-administered medications on file prior to visit.    Allergies  Allergen Reactions  . Atorvastatin     REACTION: blisters on feet  . Diltiazem Hcl   . Gemfibrozil   . Niacin   . Sulfasalazine Rash    Past Medical History  Diagnosis Date  . CAD (coronary artery disease)     a. S/P previous MI;  b. 04/2007 Cath/PCI: Taxus DES' to mid/distal RCA and RPDA as well as Ramus;  c.  myoview 8/09: EF 78%, normal perfusion  . Hypertension   . Osteoporosis   . Peripheral vascular disease   . Hyperlipidemia     Intolerant of many statins  . Chronic shoulder pain   . Chronic back pain   . Osteoarthritis   . Neuropathy   . Mild mitral and aortic regurgitation     a. 11/2011 Echo: EF 55-65%, No RWMA, Gr 1 DD, Mild AI/MR.  . Carotid stenosis     Bilateral, mild to moderate    Past Surgical History  Procedure Laterality Date  . Carotid u/s  10/2006    0-39% bilaterally  . Vesicovaginal fistula closure w/ tah    . Coronary stent placement  2002    MI, [facility]  . Coronary angioplasty  12/2002    Medical rx only  . Breast biopsy      Bilateral  . Nm myoview ltd  11/2004    Stress, negative; NL EF  . Cp admit  09/2005    Stress myoview negative  . Coronary angioplasty  01/2006    LAD/PAD disease  . Adenosine myoview  01/2006    No ischemia,  EF 70%  . Cataract extraction  09/2006    Right  . Coronary angioplasty  03/2007    3 vessel disease  . Coronary stent placement  04/2007    RCA/LCX multiple, [facility]  . Femoral hernia repair  05/2007    Incarcerated, right  . Fracture surgery  03/2009    Left leg    Family History  Problem Relation Age of Onset  . Pulmonary embolism Mother   . Heart disease Father   . Heart disease Sister   . Cancer Brother   . Heart disease Sister     History   Social History  . Marital Status: Widowed    Spouse Name: N/A    Number of Children: 3  . Years of Education: N/A   Occupational History  . Not on file.   Social History Main Topics  . Smoking status: Never Smoker   . Smokeless tobacco: Never Used  . Alcohol Use: No  . Drug Use: No  . Sexually Active: Not on file   Other Topics Concern  . Not on file   Social History Narrative   Widowed 2005 after long time care for husband after stroke   Remarried   Has 3 children   Review of Systems Sleeps well Enjoys her yard---looking forward to the  spring Appetite is fine Weight is stable Still has some urge incontinence    Objective:   Physical Exam  Constitutional: She appears well-developed and well-nourished. No distress.  Neck: Normal range of motion. Neck supple. No thyromegaly present.  Cardiovascular: Normal rate, regular rhythm and normal heart sounds.  Exam reveals no gallop.   No murmur heard. Pulmonary/Chest: Effort normal and breath sounds normal. No respiratory distress. She has no wheezes. She has no rales.  Abdominal: Soft. There is no tenderness.  Musculoskeletal: She exhibits no edema and no tenderness.  Lymphadenopathy:    She has no cervical adenopathy.  Psychiatric: She has a normal mood and affect. Her behavior is normal.          Assessment & Plan:

## 2013-02-13 NOTE — Assessment & Plan Note (Signed)
With occ stable angina No changes needed

## 2013-04-09 ENCOUNTER — Telehealth: Payer: Self-pay
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
## 2013-06-10 ENCOUNTER — Other Ambulatory Visit: Payer: Self-pay | Admitting: Internal Medicine

## 2013-06-10 MED ORDER — AMLODIPINE BESYLATE 5 MG PO TABS: 5.0000 mg | ORAL_TABLET | Freq: Every day | ORAL | Status: DC

## 2013-06-10 NOTE — Telephone Encounter (Signed)
rx sent to pharmacy by e-script  

## 2013-06-10 NOTE — Telephone Encounter (Signed)
Okay to fill for a year 

## 2013-06-16 ENCOUNTER — Ambulatory Visit (INDEPENDENT_AMBULATORY_CARE_PROVIDER_SITE_OTHER): Payer: Medicare Other | Admitting: Cardiovascular Disease

## 2013-06-16 ENCOUNTER — Encounter: Payer: Self-pay | Admitting: Cardiovascular Disease

## 2013-06-16 VITALS — BP 146/60 | HR 48 | Ht 61.5 in | Wt 122.8 lb

## 2013-06-16 DIAGNOSIS — I10 Essential (primary) hypertension: Secondary | ICD-10-CM

## 2013-06-16 DIAGNOSIS — I658 Occlusion and stenosis of other precerebral arteries: Secondary | ICD-10-CM

## 2013-06-16 DIAGNOSIS — I6523 Occlusion and stenosis of bilateral carotid arteries: Secondary | ICD-10-CM

## 2013-06-16 DIAGNOSIS — I2510 Atherosclerotic heart disease of native coronary artery without angina pectoris: Secondary | ICD-10-CM

## 2013-06-16 DIAGNOSIS — R079 Chest pain, unspecified: Secondary | ICD-10-CM

## 2013-06-16 DIAGNOSIS — I6529 Occlusion and stenosis of unspecified carotid artery: Secondary | ICD-10-CM

## 2013-06-16 DIAGNOSIS — E785 Hyperlipidemia, unspecified: Secondary | ICD-10-CM

## 2013-06-16 MED ORDER — ISOSORBIDE MONONITRATE ER 30 MG PO TB24: 30.0000 mg | ORAL_TABLET | Freq: Every day | ORAL | Status: DC

## 2013-06-16 MED ORDER — CARVEDILOL 3.125 MG PO TABS: 3.1250 mg | ORAL_TABLET | Freq: Two times a day (BID) | ORAL | Status: DC

## 2013-06-16 NOTE — Progress Notes (Signed)
HPI  79 yo with history of CAD presents for cardiology followup. She had an MI in 5/08 with PCI to ramus and RCA.  She has a chronic pattern of occasional nonexertional chest pain which requires occasional use of sublingual nitroglycerin. She had an echocardiogram done late last year which showed normal LV systolic function with mild mitral and aortic insufficiency. She was seen in early 2014 due to  increased chest pain and dyspnea. She underwent a pharmacologic nuclear stress test in February which showed no evidence of ischemia with normal ejection fraction. There was a fixed basal lateral wall defect likely due to a prior small infarct. She was noted to have faint carotid bruits. Carotid duplex showed mild to moderate disease bilaterally.  She continues to have intermittent episodes of chest pain requiring sublingual nitroglycerin. She had one episode last week that lasted about 15 minutes and required 2 sublingual nitroglycerin. She complains of being tired and fatigue. She continues to be independent with her activities of daily living.  Allergies  Allergen Reactions  . Atorvastatin     REACTION: blisters on feet  . Diltiazem Hcl   . Dye Fdc Red (Red Dye)   . Gemfibrozil   . Niacin   . Sulfasalazine Rash     Current Outpatient Prescriptions on File Prior to Visit  Medication Sig Dispense Refill  . amLODipine (NORVASC) 5 MG tablet Take 1 tablet (5 mg total) by mouth daily.  90 tablet  3  . aspirin 81 MG EC tablet Take 81 mg by mouth daily.        . carvedilol (COREG) 6.25 MG tablet Take 1 tablet (6.25 mg total) by mouth 2 (two) times daily with a meal.  180 tablet  3  . Fish Oil OIL Take 2 capsules by mouth daily.        Marland Kitchen gabapentin (NEURONTIN) 300 MG capsule TAKE ONE CAPSULE BY MOUTH THREE TIMES DAILY  90 capsule  11  . lisinopril (PRINIVIL,ZESTRIL) 20 MG tablet Take 1 tablet (20 mg total) by mouth daily.  90 tablet  3  . mometasone (NASONEX) 50 MCG/ACT nasal spray Place 2  sprays into the nose daily as needed.  17 g  11  . Multiple Vitamin (MULTIVITAMIN) capsule Take 1 capsule by mouth daily.        . nitroGLYCERIN (NITROSTAT) 0.4 MG SL tablet Place 1 tablet (0.4 mg total) under the tongue every 5 (five) minutes as needed for chest pain.  25 tablet  3  . rosuvastatin (CRESTOR) 5 MG tablet Take 1 tablet (5 mg total) by mouth daily.  90 tablet  3   No current facility-administered medications on file prior to visit.     Past Medical History  Diagnosis Date  . CAD (coronary artery disease)     a. S/P previous MI;  b. 04/2007 Cath/PCI: Taxus DES' to mid/distal RCA and RPDA as well as Ramus;  c. myoview 8/09: EF 78%, normal perfusion  . Hypertension   . Osteoporosis   . Peripheral vascular disease   . Hyperlipidemia     Intolerant of many statins  . Chronic shoulder pain   . Chronic back pain   . Osteoarthritis   . Neuropathy   . Mild mitral and aortic regurgitation     a. 11/2011 Echo: EF 55-65%, No RWMA, Gr 1 DD, Mild AI/MR.  . Carotid stenosis     Bilateral, mild to moderate     Past Surgical History  Procedure Laterality Date  .  Carotid u/s  10/2006    0-39% bilaterally  . Vesicovaginal fistula closure w/ tah    . Coronary stent placement  2002    MI, [facility]  . Coronary angioplasty  12/2002    Medical rx only  . Breast biopsy      Bilateral  . Nm myoview ltd  11/2004    Stress, negative; NL EF  . Cp admit  09/2005    Stress myoview negative  . Coronary angioplasty  01/2006    LAD/PAD disease  . Adenosine myoview  01/2006    No ischemia, EF 70%  . Cataract extraction  09/2006    Right  . Coronary angioplasty  03/2007    3 vessel disease  . Coronary stent placement  04/2007    RCA/LCX multiple, [facility]  . Femoral hernia repair  05/2007    Incarcerated, right  . Fracture surgery  03/2009    Left leg     Family History  Problem Relation Age of Onset  . Pulmonary embolism Mother   . Heart disease Father   . Heart disease Sister     . Cancer Brother   . Heart disease Sister      History   Social History  . Marital Status: Widowed    Spouse Name: N/A    Number of Children: 3  . Years of Education: N/A   Occupational History  . Not on file.   Social History Main Topics  . Smoking status: Never Smoker   . Smokeless tobacco: Never Used  . Alcohol Use: No  . Drug Use: No  . Sexually Active: Not on file   Other Topics Concern  . Not on file   Social History Narrative   Widowed 2005 after long time care for husband after stroke   Remarried   Has 3 children     PHYSICAL EXAM   BP 146/60  Pulse 48  Ht 5' 1.5" (1.562 m)  Wt 122 lb 12 oz (55.679 kg)  BMI 22.82 kg/m2 Constitutional: She is oriented to person, place, and time. She appears well-developed and well-nourished. No distress.  HENT: No nasal discharge.  Head: Normocephalic and atraumatic.  Eyes: Pupils are equal and round. Right eye exhibits no discharge. Left eye exhibits no discharge.  Neck: Normal range of motion. Neck supple. No JVD present. No thyromegaly present.  Cardiovascular: Normal rate, regular rhythm, normal heart sounds. Exam reveals no gallop and no friction rub. No murmur heard.  Pulmonary/Chest: Effort normal and breath sounds normal. No stridor. No respiratory distress. She has no wheezes. She has no rales. She exhibits no tenderness.  Abdominal: Soft. Bowel sounds are normal. She exhibits no distension. There is no tenderness. There is no rebound and no guarding.  Musculoskeletal: Normal range of motion. She exhibits +1 edema worse on the left side and no tenderness.  Neurological: She is alert and oriented to person, place, and time. Coordination normal.  Skin: Skin is warm and dry. No rash noted. She is not diaphoretic. No erythema. No pallor.  Psychiatric: She has a normal mood and affect. Her behavior is normal. Judgment and thought content normal.   AVW:[id number] sinus  Bradycardia  -Old anterior infarct.   ABNORMAL      ASSESSMENT AND PLAN

## 2013-06-16 NOTE — Assessment & Plan Note (Signed)
She continues to have intermittent episodes of substernal chest pain requiring nitroglycerin. She is bradycardic today with a heart rate of 48 beats per minute. Thus, I will decrease the dose of carvedilol to 3.125 mg once daily. I will also add Imdur 30 mg once daily.

## 2013-06-16 NOTE — Assessment & Plan Note (Signed)
I would only recommend revascularization if he is symptomatic with a stroke or TIA given her age. Continue medical therapy. The disease was mild to moderate overall.

## 2013-06-16 NOTE — Assessment & Plan Note (Signed)
Blood pressure is within acceptable range.

## 2013-06-16 NOTE — Assessment & Plan Note (Signed)
Lab Results  Component Value Date   CHOL 161 08/16/2012   HDL 50.20 08/16/2012   LDLCALC 82 08/16/2012   LDLDIRECT 77.8 01/28/2010   TRIG 144.0 08/16/2012   CHOLHDL 3 08/16/2012   Continue treatment with small dose rosuvastatin.

## 2013-06-16 NOTE — Patient Instructions (Addendum)
Decrease Carvedilol to 3.125 mg twice daily. Start Imdur 30 mg once daily.  Follow up in 6 months.

## 2013-08-19 ENCOUNTER — Other Ambulatory Visit: Payer: Self-pay | Admitting: Internal Medicine

## 2013-08-20 ENCOUNTER — Other Ambulatory Visit: Payer: Self-pay | Admitting: *Deleted

## 2013-08-20 MED ORDER — ISOSORBIDE MONONITRATE ER 30 MG PO TB24: 90.0000 mg | ORAL_TABLET | Freq: Every day | ORAL | Status: DC

## 2013-08-20 MED ORDER — CARVEDILOL 3.125 MG PO TABS: 3.1250 mg | ORAL_TABLET | Freq: Two times a day (BID) | ORAL | Status: DC

## 2013-08-20 NOTE — Telephone Encounter (Signed)
Refilled Imdur sent to [facility] pharmacy 90 day supply.

## 2013-08-20 NOTE — Telephone Encounter (Signed)
Refilled Carvedilol sent to [facility] pharmacy.

## 2013-08-26 ENCOUNTER — Encounter: Payer: Self-pay | Admitting: Internal Medicine

## 2013-08-26 ENCOUNTER — Ambulatory Visit (INDEPENDENT_AMBULATORY_CARE_PROVIDER_SITE_OTHER): Payer: Medicare Other | Admitting: Internal Medicine

## 2013-08-26 VITALS — BP 150/70 | HR 60 | Temp 98.2°F | Ht 61.0 in | Wt 123.0 lb

## 2013-08-26 DIAGNOSIS — Z733 Stress, not elsewhere classified: Secondary | ICD-10-CM

## 2013-08-26 DIAGNOSIS — Z0000 Encounter for general adult medical examination without abnormal findings: Secondary | ICD-10-CM

## 2013-08-26 DIAGNOSIS — I2510 Atherosclerotic heart disease of native coronary artery without angina pectoris: Secondary | ICD-10-CM

## 2013-08-26 DIAGNOSIS — I10 Essential (primary) hypertension: Secondary | ICD-10-CM

## 2013-08-26 DIAGNOSIS — Z23 Encounter for immunization: Secondary | ICD-10-CM

## 2013-08-26 DIAGNOSIS — E785 Hyperlipidemia, unspecified: Secondary | ICD-10-CM

## 2013-08-26 DIAGNOSIS — F439 Reaction to severe stress, unspecified: Secondary | ICD-10-CM

## 2013-08-26 LAB — CBC WITH DIFFERENTIAL/PLATELET
Basophils Relative: 0.5 % (ref 0.0–3.0)
Eosinophils Absolute: 0.2 10*3/uL (ref 0.0–0.7)
Eosinophils Relative: 2.7 % (ref 0.0–5.0)
Lymphocytes Relative: 18.7 % (ref 12.0–46.0)
Neutrophils Relative %: 69.8 % (ref 43.0–77.0)
RBC: 4.57 Mil/uL (ref 3.87–5.11)
WBC: 7.3 10*3/uL (ref 4.5–10.5)

## 2013-08-26 LAB — HEPATIC FUNCTION PANEL
ALT: 12 U/L (ref 0–35)
AST: 18 U/L (ref 0–37)
Bilirubin, Direct: 0 mg/dL (ref 0.0–0.3)
Total Bilirubin: 0.6 mg/dL (ref 0.3–1.2)

## 2013-08-26 LAB — BASIC METABOLIC PANEL
Calcium: 9.8 mg/dL (ref 8.4–10.5)
Creatinine, Ser: 0.9 mg/dL (ref 0.4–1.2)
GFR: 63.12 mL/min (ref >60.00)

## 2013-08-26 LAB — LIPID PANEL
HDL: 51.9 mg/dL (ref >39.00)
LDL Cholesterol: 88 mg/dL (ref 0–99)
Total CHOL/HDL Ratio: 3
Triglycerides: 182 mg/dL — ABNORMAL HIGH (ref 0.0–149.0)

## 2013-08-26 NOTE — Assessment & Plan Note (Signed)
BP Readings from Last 3 Encounters:  08/26/13 150/70  06/16/13 146/60  02/13/13 158/70   Reasonable control for her age No changes

## 2013-08-26 NOTE — Assessment & Plan Note (Signed)
Due for labs

## 2013-08-26 NOTE — Assessment & Plan Note (Signed)
Ongoing stress with husband and son No depression though Discussed melatonin for sleep

## 2013-08-26 NOTE — Addendum Note (Signed)
Addended by: Sueanne Margarita on: 08/26/2013 02:30 PM   Modules accepted: Orders

## 2013-08-26 NOTE — Assessment & Plan Note (Signed)
Less angina on the isosorbide

## 2013-08-26 NOTE — Assessment & Plan Note (Signed)
Doing fairly well Will update Td and give flu No cancer screening due to age

## 2013-08-26 NOTE — Patient Instructions (Signed)
You can try melatonin (over the counter) 3mg  or up to 6mg  at bedtime to help you sleep better

## 2013-08-26 NOTE — Progress Notes (Signed)
Subjective:    Patient ID: Christina [name], female    DOB: 1919-12-31, 79 y.o.   MRN: [id number]  HPI Here for physical Did fall in yard about 5 weeks ago---still has pain at left [facility] Does note some mild vision problems---still loves to read Loves to work in flowers and walk dog Still sees her husband every other day---family still restricts his interaction with her [name] is still living with her--this is a support for her (though still using drugs)  Dr Kirke Corin cut carvedilol due to bradycardia She is not sure about changes in energy--does fine in the morning, but tired by the afternoon Now on isosorbide----has reduced her angina. Only 1 episode since starting this. Hasn't needed sl NTG  Current Outpatient Prescriptions on File Prior to Visit  Medication Sig Dispense Refill  . amLODipine (NORVASC) 5 MG tablet Take 1 tablet (5 mg total) by mouth daily.  90 tablet  3  . aspirin 81 MG EC tablet Take 81 mg by mouth daily.        . carvedilol (COREG) 3.125 MG tablet Take 1 tablet (3.125 mg total) by mouth 2 (two) times daily.  180 tablet  3  . CRESTOR 5 MG tablet TAKE ONE TABLET BY MOUTH EVERY DAY  90 tablet  0  . Fish Oil OIL Take 2 capsules by mouth daily.        Marland Kitchen gabapentin (NEURONTIN) 300 MG capsule TAKE ONE CAPSULE BY MOUTH THREE TIMES DAILY  90 capsule  11  . isosorbide mononitrate (IMDUR) 30 MG 24 hr tablet Take 3 tablets (90 mg total) by mouth daily.  30 tablet  6  . lisinopril (PRINIVIL,ZESTRIL) 20 MG tablet Take 1 tablet (20 mg total) by mouth daily.  90 tablet  3  . mometasone (NASONEX) 50 MCG/ACT nasal spray Place 2 sprays into the nose daily as needed.  17 g  11  . Multiple Vitamin (MULTIVITAMIN) capsule Take 1 capsule by mouth daily.         No current facility-administered medications on file prior to visit.    Allergies  Allergen Reactions  . Atorvastatin     REACTION: blisters on feet  . Diltiazem Hcl   . Dye Fdc Red [Red Dye]   . Gemfibrozil   . Niacin   .  Sulfasalazine Rash    Past Medical History  Diagnosis Date  . CAD (coronary artery disease)     a. S/P previous MI;  b. 04/2007 Cath/PCI: Taxus DES' to mid/distal RCA and RPDA as well as Ramus;  c. myoview 8/09: EF 78%, normal perfusion  . Hypertension   . Osteoporosis   . Peripheral vascular disease   . Hyperlipidemia     Intolerant of many statins  . Chronic shoulder pain   . Chronic back pain   . Osteoarthritis   . Neuropathy   . Mild mitral and aortic regurgitation     a. 11/2011 Echo: EF 55-65%, No RWMA, Gr 1 DD, Mild AI/MR.  . Carotid stenosis     Bilateral, mild to moderate    Past Surgical History  Procedure Laterality Date  . Carotid u/s  10/2006    0-39% bilaterally  . Vesicovaginal fistula closure w/ tah    . Coronary stent placement  2002    MI, [facility]  . Coronary angioplasty  12/2002    Medical rx only  . Breast biopsy      Bilateral  . Nm myoview ltd  11/2004    Stress, negative;  NL EF  . Cp admit  09/2005    Stress myoview negative  . Coronary angioplasty  01/2006    LAD/PAD disease  . Adenosine myoview  01/2006    No ischemia, EF 70%  . Cataract extraction  09/2006    Right  . Coronary angioplasty  03/2007    3 vessel disease  . Coronary stent placement  04/2007    RCA/LCX multiple, [facility]  . Femoral hernia repair  05/2007    Incarcerated, right  . Fracture surgery  03/2009    Left leg    Family History  Problem Relation Age of Onset  . Pulmonary embolism Mother   . Heart disease Father   . Heart disease Sister   . Cancer Brother   . Heart disease Sister     History   Social History  . Marital Status: Widowed    Spouse Name: N/A    Number of Children: 3  . Years of Education: N/A   Occupational History  . Not on file.   Social History Main Topics  . Smoking status: Never Smoker   . Smokeless tobacco: Never Used  . Alcohol Use: No  . Drug Use: No  . Sexual Activity: Not on file   Other Topics Concern  . Not on file    Social History Narrative   Widowed 2005 after long time care for husband after stroke   Remarried but seperated   Has 3 children      No formal living will   Daughter [name] should be her health care POA   Has DNR already   No feeding tube if cognitively unaware   Review of Systems  Constitutional: Negative for fatigue and unexpected weight change.       Wears seat belt--still drives  HENT: Positive for congestion and rhinorrhea. Negative for hearing loss and tinnitus.        Full dentures  Eyes:       Mild vision problems Blind in left eye Cataract then laser on right  Respiratory: Negative for cough, chest tightness and shortness of breath.   Cardiovascular: Positive for chest pain and leg swelling. Negative for palpitations.       Mild edema  Gastrointestinal: Negative for nausea, vomiting, abdominal pain, constipation and blood in stool.       No heartburn  Endocrine: Positive for cold intolerance. Negative for heat intolerance.  Genitourinary: Negative for dysuria and difficulty urinating.       Frequent nocturia No incontinence  Musculoskeletal: Positive for arthralgias. Negative for back pain and joint swelling.  Skin: Negative for rash.       Some bumps on forehead No suspicious lesions  Allergic/Immunologic: Positive for environmental allergies. Negative for immunocompromised state.       Mild allergy symptoms  Neurological: Positive for headaches. Negative for dizziness, syncope and light-headedness.       Mild chronic occasional headaches  Hematological:       No focal weakness  Psychiatric/Behavioral: Positive for sleep disturbance. Negative for dysphoric mood. The patient is not nervous/anxious.        Has night awakening---worries about son and husband Has stress but mood is good       Objective:   Physical Exam  Constitutional: She is oriented to person, place, and time. She appears well-developed and well-nourished. No distress.  HENT:  Head:  Normocephalic and atraumatic.  Right Ear: External ear normal.  Left Ear: External ear normal.  Mouth/Throat: Oropharynx is clear and  moist. No oropharyngeal exudate.  Full dentures  Eyes: Conjunctivae and EOM are normal. Pupils are equal, round, and reactive to light.  Neck: Normal range of motion. Neck supple. No thyromegaly present.  Cardiovascular: Normal rate, regular rhythm, normal heart sounds and intact distal pulses.  Exam reveals no gallop.   No murmur heard. Pulmonary/Chest: Effort normal and breath sounds normal. No respiratory distress. She has no wheezes. She has no rales.  Abdominal: Soft. There is no tenderness.  Musculoskeletal: She exhibits no edema and no tenderness.  Lymphadenopathy:    She has no cervical adenopathy.  Neurological: She is alert and oriented to person, place, and time.  Skin: No erythema.  No distinct rash--just fleshy papules on forehead  Psychiatric: She has a normal mood and affect. Her behavior is normal.          Assessment & Plan:

## 2013-08-27 ENCOUNTER — Encounter: Payer: Self-pay | Admitting: Family Medicine

## 2013-09-18 ENCOUNTER — Other Ambulatory Visit: Payer: Self-pay | Admitting: *Deleted

## 2013-09-18 MED ORDER — GABAPENTIN 300 MG PO CAPS: ORAL_CAPSULE | ORAL | Status: DC

## 2013-09-18 NOTE — Telephone Encounter (Signed)
Pharmacy faxed request stating pt wanted a 90 day supply of meds. I sent rx for a 90 day supply with the remainder of refills left on rx from 06/10/13

## 2013-11-05 ENCOUNTER — Other Ambulatory Visit: Payer: Self-pay | Admitting: Internal Medicine

## 2013-12-02 ENCOUNTER — Other Ambulatory Visit: Payer: Self-pay | Admitting: Internal Medicine

## 2013-12-08 ENCOUNTER — Ambulatory Visit (INDEPENDENT_AMBULATORY_CARE_PROVIDER_SITE_OTHER): Payer: Medicare Other | Admitting: Cardiovascular Disease

## 2013-12-08 ENCOUNTER — Encounter: Payer: Self-pay | Admitting: Cardiovascular Disease

## 2013-12-08 VITALS — BP 136/67 | HR 60 | Ht 61.5 in | Wt 120.8 lb

## 2013-12-08 DIAGNOSIS — I6523 Occlusion and stenosis of bilateral carotid arteries: Secondary | ICD-10-CM

## 2013-12-08 DIAGNOSIS — I10 Essential (primary) hypertension: Secondary | ICD-10-CM

## 2013-12-08 DIAGNOSIS — E785 Hyperlipidemia, unspecified: Secondary | ICD-10-CM

## 2013-12-08 DIAGNOSIS — I2510 Atherosclerotic heart disease of native coronary artery without angina pectoris: Secondary | ICD-10-CM

## 2013-12-08 DIAGNOSIS — I658 Occlusion and stenosis of other precerebral arteries: Secondary | ICD-10-CM

## 2013-12-08 DIAGNOSIS — R0602 Shortness of breath: Secondary | ICD-10-CM

## 2013-12-08 DIAGNOSIS — I6529 Occlusion and stenosis of unspecified carotid artery: Secondary | ICD-10-CM

## 2013-12-08 NOTE — Progress Notes (Signed)
HPI  79 yo with history of CAD presents for cardiology followup. She had an MI in 5/08 with PCI to ramus and RCA.  She has a chronic pattern of occasional nonexertional chest pain which requires occasional use of sublingual nitroglycerin. She had an echocardiogram done in 2013 which showed normal LV systolic function with mild mitral and aortic insufficiency. She was seen in early 2014 due to  increased chest pain and dyspnea. She underwent a pharmacologic nuclear stress test in February which showed no evidence of ischemia with normal ejection fraction. There was a fixed basal lateral wall defect likely due to a prior small infarct. She was noted to have faint carotid bruits. Carotid duplex showed mild to moderate disease bilaterally.  She was started on Imdur 30 mg once daily with significant improvement in symptoms. She rarely has to use SL NTG now.   Allergies  Allergen Reactions  . Atorvastatin     REACTION: blisters on feet  . Diltiazem Hcl   . Dye Fdc Red [Red Dye]   . Gemfibrozil   . Niacin   . Sulfasalazine Rash     Current Outpatient Prescriptions on File Prior to Visit  Medication Sig Dispense Refill  . amLODipine (NORVASC) 5 MG tablet Take 1 tablet (5 mg total) by mouth daily.  90 tablet  3  . aspirin 81 MG EC tablet Take 81 mg by mouth daily.        . carvedilol (COREG) 3.125 MG tablet Take 1 tablet (3.125 mg total) by mouth 2 (two) times daily.  180 tablet  3  . CRESTOR 5 MG tablet TAKE ONE TABLET BY MOUTH ONCE DAILY  90 tablet  0  . Fish Oil OIL Take 2 capsules by mouth daily.        Marland Kitchen gabapentin (NEURONTIN) 300 MG capsule TAKE ONE CAPSULE BY MOUTH THREE TIMES DAILY  270 capsule  2  . lisinopril (PRINIVIL,ZESTRIL) 20 MG tablet TAKE ONE TABLET BY MOUTH EVERY DAY  90 tablet  3  . mometasone (NASONEX) 50 MCG/ACT nasal spray Place 2 sprays into the nose daily as needed.  17 g  11  . Multiple Vitamin (MULTIVITAMIN) capsule Take 1 capsule by mouth daily.         No  current facility-administered medications on file prior to visit.     Past Medical History  Diagnosis Date  . CAD (coronary artery disease)     a. S/P previous MI;  b. 04/2007 Cath/PCI: Taxus DES' to mid/distal RCA and RPDA as well as Ramus;  c. myoview 8/09: EF 78%, normal perfusion  . Hypertension   . Osteoporosis   . Peripheral vascular disease   . Hyperlipidemia     Intolerant of many statins  . Chronic shoulder pain   . Chronic back pain   . Osteoarthritis   . Neuropathy   . Mild mitral and aortic regurgitation     a. 11/2011 Echo: EF 55-65%, No RWMA, Gr 1 DD, Mild AI/MR.  . Carotid stenosis     Bilateral, mild to moderate     Past Surgical History  Procedure Laterality Date  . Carotid u/s  10/2006    0-39% bilaterally  . Vesicovaginal fistula closure w/ tah    . Coronary stent placement  2002    MI, [facility]  . Coronary angioplasty  12/2002    Medical rx only  . Breast biopsy      Bilateral  . Nm myoview ltd  11/2004  Stress, negative; NL EF  . Cp admit  09/2005    Stress myoview negative  . Coronary angioplasty  01/2006    LAD/PAD disease  . Adenosine myoview  01/2006    No ischemia, EF 70%  . Cataract extraction  09/2006    Right  . Coronary angioplasty  03/2007    3 vessel disease  . Coronary stent placement  04/2007    RCA/LCX multiple, [facility]  . Femoral hernia repair  05/2007    Incarcerated, right  . Fracture surgery  03/2009    Left leg     Family History  Problem Relation Age of Onset  . Pulmonary embolism Mother   . Heart disease Father   . Heart disease Sister   . Cancer Brother   . Heart disease Sister      History   Social History  . Marital Status: Widowed    Spouse Name: N/A    Number of Children: 3  . Years of Education: N/A   Occupational History  . Not on file.   Social History Main Topics  . Smoking status: Never Smoker   . Smokeless tobacco: Never Used  . Alcohol Use: No  . Drug Use: No  . Sexual Activity: Not on  file   Other Topics Concern  . Not on file   Social History Narrative   Widowed 2005 after long time care for husband after stroke   Remarried but seperated   Has 3 children      No formal living will   Daughter [name] should be her health care POA   Has DNR already   No feeding tube if cognitively unaware     PHYSICAL EXAM   BP 136/67  Pulse 60  Ht 5' 1.5" (1.562 m)  Wt 120 lb 12 oz (54.772 kg)  BMI 22.45 kg/m2 Constitutional: She is oriented to person, place, and time. She appears well-developed and well-nourished. No distress.  HENT: No nasal discharge.  Head: Normocephalic and atraumatic.  Eyes: Pupils are equal and round. Right eye exhibits no discharge. Left eye exhibits no discharge.  Neck: Normal range of motion. Neck supple. No JVD present. No thyromegaly present.  Cardiovascular: Normal rate, regular rhythm, normal heart sounds. Exam reveals no gallop and no friction rub. There is 2/6 SEM at aortic area.  Pulmonary/Chest: Effort normal and breath sounds normal. No stridor. No respiratory distress. She has no wheezes. She has no rales. She exhibits no tenderness.  Abdominal: Soft. Bowel sounds are normal. She exhibits no distension. There is no tenderness. There is no rebound and no guarding.  Musculoskeletal: Normal range of motion. She exhibits +1 edema worse on the left side and no tenderness.  Neurological: She is alert and oriented to person, place, and time. Coordination normal.  Skin: Skin is warm and dry. No rash noted. She is not diaphoretic. No erythema. No pallor.  Psychiatric: She has a normal mood and affect. Her behavior is normal. Judgment and thought content normal.   EKG: NSR with STTW changes in inferior and inferolateral leads  ABNORMAL     ASSESSMENT AND PLAN

## 2013-12-08 NOTE — Patient Instructions (Signed)
Your physician recommends that you continue on your current medications as directed. Please refer to the Current Medication list given to you today.  Your physician wants you to follow-up in: 6 months. You will receive a reminder letter in the mail two months in advance. If you don't receive a letter, please call our office to schedule the follow-up appointment.  

## 2013-12-16 NOTE — Assessment & Plan Note (Signed)
I would only recommend revascularization if she is symptomatic with a stroke or TIA given her age. Continue medical therapy. The disease was mild to moderate overall.           

## 2013-12-16 NOTE — Assessment & Plan Note (Signed)
BP is well controlled on current medications.  

## 2013-12-16 NOTE — Assessment & Plan Note (Signed)
Continue treatment with small dose Crestor.  Lab Results  Component Value Date   CHOL 176 08/26/2013   HDL 51.90 08/26/2013   LDLCALC 88 08/26/2013   LDLDIRECT 77.8 01/28/2010   TRIG 182.0* 08/26/2013   CHOLHDL 3 08/26/2013

## 2013-12-16 NOTE — Assessment & Plan Note (Signed)
Angina improved after the addition of Imdur. She is on good antianginal therapy. Continue current medications.

## 2014-01-06 ENCOUNTER — Encounter (INDEPENDENT_AMBULATORY_CARE_PROVIDER_SITE_OTHER): Payer: Medicare Other

## 2014-01-06 DIAGNOSIS — I6529 Occlusion and stenosis of unspecified carotid artery: Secondary | ICD-10-CM

## 2014-01-29 ENCOUNTER — Telehealth: Payer: Self-pay | Admitting: *Deleted

## 2014-01-29 NOTE — Telephone Encounter (Signed)
Patient doesn't want to sched Carotid doppler at this time

## 2014-02-24 ENCOUNTER — Ambulatory Visit (INDEPENDENT_AMBULATORY_CARE_PROVIDER_SITE_OTHER): Payer: Medicare Other | Admitting: Internal Medicine

## 2014-02-24 ENCOUNTER — Encounter: Payer: Self-pay | Admitting: Internal Medicine

## 2014-02-24 ENCOUNTER — Telehealth: Payer: Self-pay | Admitting: Internal Medicine

## 2014-02-24 VITALS — BP 130/66 | HR 66 | Temp 98.0°F | Wt 122.5 lb

## 2014-02-24 DIAGNOSIS — F439 Reaction to severe stress, unspecified: Secondary | ICD-10-CM

## 2014-02-24 DIAGNOSIS — Z733 Stress, not elsewhere classified: Secondary | ICD-10-CM

## 2014-02-24 DIAGNOSIS — I10 Essential (primary) hypertension: Secondary | ICD-10-CM

## 2014-02-24 DIAGNOSIS — I2510 Atherosclerotic heart disease of native coronary artery without angina pectoris: Secondary | ICD-10-CM

## 2014-02-24 DIAGNOSIS — E785 Hyperlipidemia, unspecified: Secondary | ICD-10-CM

## 2014-02-24 NOTE — Assessment & Plan Note (Signed)
With stable angina pattern No changes needed

## 2014-02-24 NOTE — Progress Notes (Signed)
Subjective:    Patient ID: Christina [name], female    DOB: May 10, 1920, 80 y.o.   MRN: [id number]  HPI Doing okay  Still gets chest pain at times---nitro will relieve it Usually if she gets nervous. Still with stressful situation with husband---he does come over just about every day--but his family still limits interaction Still enjoys working in yard---spent all day out there yesterday  Has noticed her memory fading some Can't remember names --like "dandelion" that she saw in yard She pays some bills--daughter does most bills on line Still drives--never gets lost. Just had license renewed Has not had to give up any tasks or have apparent apraxia  Son still with her Still using drugs--but not stealing from you.  Had coughing illness that lasted about 4 weeks Seems to be over it now No breathing problems  Current Outpatient Prescriptions on File Prior to Visit  Medication Sig Dispense Refill  . amLODipine (NORVASC) 5 MG tablet Take 1 tablet (5 mg total) by mouth daily.  90 tablet  3  . aspirin 81 MG EC tablet Take 81 mg by mouth daily.        . carvedilol (COREG) 3.125 MG tablet Take 1 tablet (3.125 mg total) by mouth 2 (two) times daily.  180 tablet  3  . CRESTOR 5 MG tablet TAKE ONE TABLET BY MOUTH ONCE DAILY  90 tablet  0  . Fish Oil OIL Take 2 capsules by mouth daily.        Marland Kitchen gabapentin (NEURONTIN) 300 MG capsule TAKE ONE CAPSULE BY MOUTH THREE TIMES DAILY  270 capsule  2  . isosorbide mononitrate (IMDUR) 30 MG 24 hr tablet Take 30 mg by mouth daily.      Marland Kitchen lisinopril (PRINIVIL,ZESTRIL) 20 MG tablet TAKE ONE TABLET BY MOUTH EVERY DAY  90 tablet  3  . mometasone (NASONEX) 50 MCG/ACT nasal spray Place 2 sprays into the nose daily as needed.  17 g  11  . Multiple Vitamin (MULTIVITAMIN) capsule Take 1 capsule by mouth daily.         No current facility-administered medications on file prior to visit.    Allergies  Allergen Reactions  . Atorvastatin     REACTION: blisters on  feet  . Diltiazem Hcl   . Dye Fdc Red [Red Dye]   . Gemfibrozil   . Niacin   . Sulfasalazine Rash    Past Medical History  Diagnosis Date  . CAD (coronary artery disease)     a. S/P previous MI;  b. 04/2007 Cath/PCI: Taxus DES' to mid/distal RCA and RPDA as well as Ramus;  c. myoview 8/09: EF 78%, normal perfusion  . Hypertension   . Osteoporosis   . Peripheral vascular disease   . Hyperlipidemia     Intolerant of many statins  . Chronic shoulder pain   . Chronic back pain   . Osteoarthritis   . Neuropathy   . Mild mitral and aortic regurgitation     a. 11/2011 Echo: EF 55-65%, No RWMA, Gr 1 DD, Mild AI/MR.  . Carotid stenosis     Bilateral, mild to moderate    Past Surgical History  Procedure Laterality Date  . Carotid u/s  10/2006    0-39% bilaterally  . Vesicovaginal fistula closure w/ tah    . Coronary stent placement  2002    MI, [facility]  . Coronary angioplasty  12/2002    Medical rx only  . Breast biopsy  Bilateral  . Nm myoview ltd  11/2004    Stress, negative; NL EF  . Cp admit  09/2005    Stress myoview negative  . Coronary angioplasty  01/2006    LAD/PAD disease  . Adenosine myoview  01/2006    No ischemia, EF 70%  . Cataract extraction  09/2006    Right  . Coronary angioplasty  03/2007    3 vessel disease  . Coronary stent placement  04/2007    RCA/LCX multiple, [facility]  . Femoral hernia repair  05/2007    Incarcerated, right  . Fracture surgery  03/2009    Left leg    Family History  Problem Relation Age of Onset  . Pulmonary embolism Mother   . Heart disease Father   . Heart disease Sister   . Cancer Brother   . Heart disease Sister     History   Social History  . Marital Status: Widowed    Spouse Name: N/A    Number of Children: 3  . Years of Education: N/A   Occupational History  . Not on file.   Social History Main Topics  . Smoking status: Never Smoker   . Smokeless tobacco: Never Used  . Alcohol Use: No  . Drug Use: No   . Sexual Activity: Not on file   Other Topics Concern  . Not on file   Social History Narrative   Widowed 2005 after long time care for husband after stroke   Remarried but seperated   Has 3 children      No formal living will   Daughter Carney BernJean should be her health care POA   Has DNR already   No feeding tube if cognitively unaware   Review of Systems Still doesn't sleep well--so much on her mind Frequent nocturia--gets urgency then trouble voiding Appetite also not great Weight is up a couple of pounds    Objective:   Physical Exam  Constitutional: She appears well-developed and well-nourished. No distress.  Neck: Normal range of motion. Neck supple. No thyromegaly present.  Cardiovascular: Normal rate, regular rhythm and normal heart sounds.  Exam reveals no gallop.   No murmur heard. Pulmonary/Chest: Effort normal and breath sounds normal. No respiratory distress. She has no wheezes. She has no rales.  Abdominal: Soft. There is no tenderness.  Musculoskeletal: She exhibits no edema and no tenderness.  Lymphadenopathy:    She has no cervical adenopathy.  Psychiatric:  Lots of family stress and notes ongoing anxiety--but not depressed          Assessment & Plan:

## 2014-02-24 NOTE — Telephone Encounter (Signed)
Relevant patient education mailed to patient.  

## 2014-02-24 NOTE — Assessment & Plan Note (Signed)
Lab Results  Component Value Date   LDLCALC 88 08/26/2013   No problems with the statin

## 2014-02-24 NOTE — Assessment & Plan Note (Signed)
BP Readings from Last 3 Encounters:  02/24/14 130/66  12/08/13 136/67  08/26/13 150/70   Good control

## 2014-02-24 NOTE — Progress Notes (Signed)
Pre visit review using our clinic review tool, if applicable. No additional management support is needed unless otherwise documented below in the visit note. 

## 2014-02-24 NOTE — Assessment & Plan Note (Signed)
Ongoing family stress No medications are indicated

## 2014-03-10 ENCOUNTER — Other Ambulatory Visit: Payer: Self-pay | Admitting: Internal Medicine

## 2014-03-12 ENCOUNTER — Telehealth: Payer: Self-pay

## 2014-03-12 NOTE — Telephone Encounter (Signed)
Pt said has been paying $120.00 for 3 months of Crestor; when pt went to purchase Crestor cost for 3 months was $305.00. Pt wants to know if Crestor has become generic yet or what should she do; pt cannot afford $305.00. Spoke with [facility] [address] and Crestor does not have generic yet and pts coverage may have changed because med went thru and does not need any prior auth.pt wants to know what to do. [facility] [address]

## 2014-03-12 NOTE — Telephone Encounter (Signed)
We can try a different cholesterol med Trouble with lipitor Find out if she ever tried pravachol or zocor. They are generic and should be a lot cheaper

## 2014-03-17 NOTE — Telephone Encounter (Signed)
Spoke with patient and she will continue the crestor, the first month was $305 and then the price will go down. I also gave pt samples to help her along.

## 2014-03-25 ENCOUNTER — Encounter: Payer: Self-pay | Admitting: Cardiovascular Disease

## 2014-03-31 ENCOUNTER — Other Ambulatory Visit: Payer: Self-pay | Admitting: Cardiovascular Disease

## 2014-05-15 ENCOUNTER — Other Ambulatory Visit: Payer: Self-pay

## 2014-05-15 MED ORDER — NITROGLYCERIN 0.4 MG SL SUBL: 0.4000 mg | SUBLINGUAL_TABLET | SUBLINGUAL | Status: DC | PRN

## 2014-05-15 NOTE — Telephone Encounter (Signed)
rx sent to pharmacy by e-script  

## 2014-05-15 NOTE — Telephone Encounter (Signed)
She has had this for many years--probably taken off inadvertently  Okay #25 x 3

## 2014-05-15 NOTE — Telephone Encounter (Signed)
Christina [name] pt s son left v/m requesting refill nitrostat 0.4 mg to [facility] [address]. Not on current med list but on historical list. Unable to reach pt or son to see if wants to have on hand if needed or if pt is having a problem. OK to refill?

## 2014-05-23 ENCOUNTER — Emergency Department: Payer: Self-pay | Admitting: Emergency Medicine

## 2014-05-23 LAB — CBC
HCT: 39 % (ref 35.0–47.0)
HGB: 12.7 g/dL (ref 12.0–16.0)
MCH: 31 pg (ref 26.0–34.0)
MCHC: 32.5 g/dL (ref 32.0–36.0)
MCV: 95 fL (ref 80–100)
Platelet: 200 10*3/uL (ref 150–440)
RBC: 4.09 10*6/uL (ref 3.80–5.20)
RDW: 14.3 % (ref 11.5–14.5)
WBC: 7 10*3/uL (ref 3.6–11.0)

## 2014-05-23 LAB — BASIC METABOLIC PANEL
Anion Gap: 6 — ABNORMAL LOW (ref 7–16)
BUN: 26 mg/dL — AB (ref 7–18)
CHLORIDE: 109 mmol/L — AB (ref 98–107)
CO2: 26 mmol/L (ref 21–32)
CREATININE: 1.02 mg/dL (ref 0.60–1.30)
Calcium, Total: 8.9 mg/dL (ref 8.5–10.1)
EGFR (African American): 55 — ABNORMAL LOW
EGFR (Non-African Amer.): 48 — ABNORMAL LOW
Glucose: 165 mg/dL — ABNORMAL HIGH (ref 65–99)
OSMOLALITY: 290 (ref 275–301)
Potassium: 3.8 mmol/L (ref 3.5–5.1)
Sodium: 141 mmol/L (ref 136–145)

## 2014-05-23 LAB — PRO B NATRIURETIC PEPTIDE: B-Type Natriuretic Peptide: 363 pg/mL (ref 0–450)

## 2014-05-23 LAB — PROTIME-INR
INR: 1.1
Prothrombin Time: 14.1 secs (ref 11.5–14.7)

## 2014-05-23 LAB — TROPONIN I: Troponin-I: <0.02 ng/mL

## 2014-05-25 ENCOUNTER — Encounter: Payer: Self-pay | Admitting: Cardiovascular Disease

## 2014-05-25 ENCOUNTER — Ambulatory Visit (INDEPENDENT_AMBULATORY_CARE_PROVIDER_SITE_OTHER): Payer: Medicare Other | Admitting: Cardiovascular Disease

## 2014-05-25 VITALS — BP 123/68 | HR 60 | Ht 60.0 in | Wt 126.0 lb

## 2014-05-25 DIAGNOSIS — I10 Essential (primary) hypertension: Secondary | ICD-10-CM

## 2014-05-25 DIAGNOSIS — R0600 Dyspnea, unspecified: Secondary | ICD-10-CM

## 2014-05-25 DIAGNOSIS — I2510 Atherosclerotic heart disease of native coronary artery without angina pectoris: Secondary | ICD-10-CM

## 2014-05-25 DIAGNOSIS — E785 Hyperlipidemia, unspecified: Secondary | ICD-10-CM

## 2014-05-25 DIAGNOSIS — R0609 Other forms of dyspnea: Secondary | ICD-10-CM

## 2014-05-25 DIAGNOSIS — R0989 Other specified symptoms and signs involving the circulatory and respiratory systems: Secondary | ICD-10-CM

## 2014-05-25 DIAGNOSIS — I6529 Occlusion and stenosis of unspecified carotid artery: Secondary | ICD-10-CM

## 2014-05-25 MED ORDER — LISINOPRIL 10 MG PO TABS: 10.0000 mg | ORAL_TABLET | Freq: Every day | ORAL | Status: DC

## 2014-05-25 MED ORDER — ISOSORBIDE MONONITRATE ER 60 MG PO TB24: 60.0000 mg | ORAL_TABLET | Freq: Every day | ORAL | Status: DC

## 2014-05-25 NOTE — Assessment & Plan Note (Signed)
I decreased the dose of lisinopril to 10 mg once daily given the increase in Imdur.

## 2014-05-25 NOTE — Patient Instructions (Signed)
Increase Imdur (Isosorbide) to 60 mg once daily.  Decrease Lisinopril to 10 mg once daily.   Resume regular activities.   Your physician has requested that you have an echocardiogram. Echocardiography is a painless test that uses sound waves to create images of your heart. It provides your doctor with information about the size and shape of your heart and how well your heart's chambers and valves are working. This procedure takes approximately one hour. There are no restrictions for this procedure.  Follow up after echo.

## 2014-05-25 NOTE — Assessment & Plan Note (Signed)
Lab Results  Component Value Date   CHOL 176 08/26/2013   HDL 51.90 08/26/2013   LDLCALC 88 08/26/2013   LDLDIRECT 77.8 01/28/2010   TRIG 182.0* 08/26/2013   CHOLHDL 3 08/26/2013   Continue treatment with small dose Crestor.

## 2014-05-25 NOTE — Assessment & Plan Note (Signed)
I would only recommend revascularization if she is symptomatic with a stroke or TIA given her age. Continue medical therapy. The disease was mild to moderate overall.           

## 2014-05-25 NOTE — Progress Notes (Signed)
HPI  80 yo with history of CAD presents for cardiology followup. She had an MI in 5/08 with PCI to ramus and RCA.  She has a chronic pattern of occasional nonexertional chest pain which requires occasional use of sublingual nitroglycerin. She had an echocardiogram done in 2013 which showed normal LV systolic function with mild mitral and aortic insufficiency. She was seen in early 2014 due to  increased chest pain and dyspnea. She underwent a pharmacologic nuclear stress test in February which showed no evidence of ischemia with normal ejection fraction. There was a fixed basal lateral wall defect likely due to a prior small infarct. She was noted to have faint carotid bruits. Carotid duplex showed mild to moderate disease bilaterally.  She was started on Imdur 30 mg once daily with significant improvement in symptoms.  She reports having a weekends with prolonged episode of chest pain on Saturday which required 3 sublingual nitroglycerin. [facility] ECG showed no acute changes. Cardiac enzymes were negative. BNP was 363. She was discharged home. She had no recurrent chest pain but does complain of increased dyspnea and fatigue.   Allergies  Allergen Reactions  . Atorvastatin     REACTION: blisters on feet  . Diltiazem Hcl   . Dye Fdc Red [Red Dye]   . Gemfibrozil   . Niacin   . Sulfasalazine Rash     Current Outpatient Prescriptions on File Prior to Visit  Medication Sig Dispense Refill  . amLODipine (NORVASC) 5 MG tablet Take 1 tablet (5 mg total) by mouth daily.  90 tablet  3  . aspirin 81 MG EC tablet Take 81 mg by mouth daily.        . carvedilol (COREG) 3.125 MG tablet Take 1 tablet (3.125 mg total) by mouth 2 (two) times daily.  180 tablet  3  . CRESTOR 5 MG tablet TAKE ONE TABLET BY MOUTH ONCE DAILY  90 tablet  0  . Fish Oil OIL Take 2 capsules by mouth daily.        Marland Kitchen. gabapentin (NEURONTIN) 300 MG capsule TAKE ONE CAPSULE BY MOUTH THREE TIMES  DAILY  270 capsule  2  . isosorbide mononitrate (IMDUR) 30 MG 24 hr tablet Take 30 mg by mouth daily.      . isosorbide mononitrate (IMDUR) 30 MG 24 hr tablet TAKE ONE TABLET BY MOUTH ONCE DAILY  30 tablet  3  . lisinopril (PRINIVIL,ZESTRIL) 20 MG tablet TAKE ONE TABLET BY MOUTH EVERY DAY  90 tablet  3  . mometasone (NASONEX) 50 MCG/ACT nasal spray Place 2 sprays into the nose daily as needed.  17 g  11  . Multiple Vitamin (MULTIVITAMIN) capsule Take 1 capsule by mouth daily.        . nitroGLYCERIN (NITROSTAT) 0.4 MG SL tablet Place 1 tablet (0.4 mg total) under the tongue every 5 (five) minutes as needed for chest pain.  25 tablet  3   No current facility-administered medications on file prior to visit.     Past Medical History  Diagnosis Date  . CAD (coronary artery disease)     a. S/P previous MI;  b. 04/2007 Cath/PCI: Taxus DES' to mid/distal RCA and RPDA as well as Ramus;  c. myoview 8/09: EF 78%, normal perfusion  . Hypertension   . Osteoporosis   . Peripheral vascular disease   . Hyperlipidemia     Intolerant of many statins  . Chronic shoulder pain   .  Chronic back pain   . Osteoarthritis   . Neuropathy   . Mild mitral and aortic regurgitation     a. 11/2011 Echo: EF 55-65%, No RWMA, Gr 1 DD, Mild AI/MR.  . Carotid stenosis     Bilateral, mild to moderate     Past Surgical History  Procedure Laterality Date  . Carotid u/s  10/2006    0-39% bilaterally  . Vesicovaginal fistula closure w/ tah    . Coronary stent placement  2002    MI, [facility]  . Coronary angioplasty  12/2002    Medical rx only  . Breast biopsy      Bilateral  . Nm myoview ltd  11/2004    Stress, negative; NL EF  . Cp admit  09/2005    Stress myoview negative  . Coronary angioplasty  01/2006    LAD/PAD disease  . Adenosine myoview  01/2006    No ischemia, EF 70%  . Cataract extraction  09/2006    Right  . Coronary angioplasty  03/2007    3 vessel disease  . Coronary stent placement  04/2007     RCA/LCX multiple, [facility]  . Femoral hernia repair  05/2007    Incarcerated, right  . Fracture surgery  03/2009    Left leg     Family History  Problem Relation Age of Onset  . Pulmonary embolism Mother   . Heart disease Father   . Heart disease Sister   . Cancer Brother   . Heart disease Sister      History   Social History  . Marital Status: Widowed    Spouse Name: N/A    Number of Children: 3  . Years of Education: N/A   Occupational History  . Not on file.   Social History Main Topics  . Smoking status: Never Smoker   . Smokeless tobacco: Never Used  . Alcohol Use: No  . Drug Use: No  . Sexual Activity: Not on file   Other Topics Concern  . Not on file   Social History Narrative   Widowed 2005 after long time care for husband after stroke   Remarried but seperated   Has 3 children      No formal living will   Daughter [name] should be her health care POA   Has DNR already   No feeding tube if cognitively unaware     PHYSICAL EXAM   BP 123/68  Pulse 60  Ht 5\' 1"  (1.549 m)  Wt 126 lb (57.153 kg)  BMI 23.82 kg/m2 Constitutional: She is oriented to person, place, and time. She appears well-developed and well-nourished. No distress.  HENT: No nasal discharge.  Head: Normocephalic and atraumatic.  Eyes: Pupils are equal and round. Right eye exhibits no discharge. Left eye exhibits no discharge.  Neck: Normal range of motion. Neck supple. No JVD present. No thyromegaly present.  Cardiovascular: Normal rate, regular rhythm, normal heart sounds. Exam reveals no gallop and no friction rub. There is 2/6 SEM at aortic area.  Pulmonary/Chest: Effort normal and breath sounds normal. No stridor. No respiratory distress. She has no wheezes. She has no rales. She exhibits no tenderness.  Abdominal: Soft. Bowel sounds are normal. She exhibits no distension. There is no tenderness. There is no rebound and no guarding.  Musculoskeletal: Normal range of motion. She  exhibits +1 edema worse on the left side and no tenderness.  Neurological: She is alert and oriented to person, place, and time. Coordination normal.  Skin: Skin is warm and dry. No rash noted. She is not diaphoretic. No erythema. No pallor.  Psychiatric: She has a normal mood and affect. Her behavior is normal. Judgment and thought content normal.   EKG: Sinus  Rhythm  -Old anteroseptal infarct.   ABNORMAL    ASSESSMENT AND PLAN

## 2014-05-25 NOTE — Assessment & Plan Note (Signed)
She reports worsening chest pain and shortness of breath. Given her age, I would try to maximize medical therapy before considering further ischemic evaluation. Thus, I increased the dose of Imdur to 60 mg once daily. I requested an echocardiogram to evaluate LV systolic/diastolic function. Cardiac catheterization should be considered as a last resort.

## 2014-06-09 ENCOUNTER — Other Ambulatory Visit (INDEPENDENT_AMBULATORY_CARE_PROVIDER_SITE_OTHER): Payer: Medicare Other

## 2014-06-09 ENCOUNTER — Other Ambulatory Visit: Payer: Self-pay

## 2014-06-09 ENCOUNTER — Ambulatory Visit: Payer: Medicare Other | Admitting: Cardiovascular Disease

## 2014-06-09 DIAGNOSIS — R0602 Shortness of breath: Secondary | ICD-10-CM

## 2014-06-09 DIAGNOSIS — R079 Chest pain, unspecified: Secondary | ICD-10-CM

## 2014-06-09 DIAGNOSIS — R0600 Dyspnea, unspecified: Secondary | ICD-10-CM

## 2014-06-15 ENCOUNTER — Ambulatory Visit: Payer: Medicare Other | Admitting: Cardiovascular Disease

## 2014-07-03 ENCOUNTER — Ambulatory Visit (INDEPENDENT_AMBULATORY_CARE_PROVIDER_SITE_OTHER): Payer: Medicare Other | Admitting: Cardiovascular Disease

## 2014-07-03 ENCOUNTER — Encounter: Payer: Self-pay | Admitting: Cardiovascular Disease

## 2014-07-03 VITALS — BP 110/58 | HR 61 | Ht 61.5 in | Wt 128.5 lb

## 2014-07-03 DIAGNOSIS — I658 Occlusion and stenosis of other precerebral arteries: Secondary | ICD-10-CM

## 2014-07-03 DIAGNOSIS — E785 Hyperlipidemia, unspecified: Secondary | ICD-10-CM

## 2014-07-03 DIAGNOSIS — I6529 Occlusion and stenosis of unspecified carotid artery: Secondary | ICD-10-CM

## 2014-07-03 DIAGNOSIS — I2510 Atherosclerotic heart disease of native coronary artery without angina pectoris: Secondary | ICD-10-CM

## 2014-07-03 DIAGNOSIS — I10 Essential (primary) hypertension: Secondary | ICD-10-CM

## 2014-07-03 DIAGNOSIS — I6523 Occlusion and stenosis of bilateral carotid arteries: Secondary | ICD-10-CM

## 2014-07-03 NOTE — Progress Notes (Signed)
HPI  80 yo with history of CAD presents for cardiology followup. She had an MI in 5/08 with PCI to ramus and RCA.  She has a chronic pattern of occasional nonexertional chest pain which requires occasional use of sublingual nitroglycerin. She had an echocardiogram done in 2013 which showed normal LV systolic function with mild mitral and aortic insufficiency. She was seen in early 2014 due to  increased chest pain and dyspnea. She underwent a pharmacologic nuclear stress test in February which showed no evidence of ischemia with normal ejection fraction. There was a fixed basal lateral wall defect likely due to a prior small infarct. She was noted to have faint carotid bruits. Carotid duplex showed mild to moderate disease bilaterally.  She was seen recently for worsening chest pain. I increased the dose of Imdur to 60 mg once daily. Echocardiogram showed normal LV systolic function. She is overall feeling better and reports significant improvement in chest pain. She does report a fall recently but no clear syncope. She is not orthostatic today.   Allergies  Allergen Reactions  . Atorvastatin     REACTION: blisters on feet  . Diltiazem Hcl   . Dye Fdc Red [Red Dye]   . Gemfibrozil   . Niacin   . Sulfasalazine Rash     Current Outpatient Prescriptions on File Prior to Visit  Medication Sig Dispense Refill  . amLODipine (NORVASC) 5 MG tablet Take 1 tablet (5 mg total) by mouth daily.  90 tablet  3  . aspirin 81 MG EC tablet Take 81 mg by mouth daily.        . carvedilol (COREG) 3.125 MG tablet Take 1 tablet (3.125 mg total) by mouth 2 (two) times daily.  180 tablet  3  . CRESTOR 5 MG tablet TAKE ONE TABLET BY MOUTH ONCE DAILY  90 tablet  0  . Fish Oil OIL Take 2 capsules by mouth daily.        Marland Kitchen. gabapentin (NEURONTIN) 300 MG capsule TAKE ONE CAPSULE BY MOUTH THREE TIMES DAILY  270 capsule  2  . isosorbide mononitrate (IMDUR) 60 MG 24 hr tablet Take 1 tablet (60 mg total) by mouth  daily.  30 tablet  6  . lisinopril (PRINIVIL,ZESTRIL) 10 MG tablet Take 1 tablet (10 mg total) by mouth daily.  30 tablet  6  . mometasone (NASONEX) 50 MCG/ACT nasal spray Place 2 sprays into the nose daily as needed.  17 g  11  . Multiple Vitamin (MULTIVITAMIN) capsule Take 1 capsule by mouth daily.        . nitroGLYCERIN (NITROSTAT) 0.4 MG SL tablet Place 1 tablet (0.4 mg total) under the tongue every 5 (five) minutes as needed for chest pain.  25 tablet  3   No current facility-administered medications on file prior to visit.     Past Medical History  Diagnosis Date  . CAD (coronary artery disease)     a. S/P previous MI;  b. 04/2007 Cath/PCI: Taxus DES' to mid/distal RCA and RPDA as well as Ramus;  c. myoview 8/09: EF 78%, normal perfusion  . Hypertension   . Osteoporosis   . Peripheral vascular disease   . Hyperlipidemia     Intolerant of many statins  . Chronic shoulder pain   . Chronic back pain   . Osteoarthritis   . Neuropathy   . Mild mitral and aortic regurgitation     a. 11/2011 Echo: EF 55-65%, No RWMA, Gr 1 DD,  Mild AI/MR.  . Carotid stenosis     Bilateral, mild to moderate     Past Surgical History  Procedure Laterality Date  . Carotid u/s  10/2006    0-39% bilaterally  . Vesicovaginal fistula closure w/ tah    . Coronary stent placement  2002    MI, [facility]  . Coronary angioplasty  12/2002    Medical rx only  . Breast biopsy      Bilateral  . Nm myoview ltd  11/2004    Stress, negative; NL EF  . Cp admit  09/2005    Stress myoview negative  . Coronary angioplasty  01/2006    LAD/PAD disease  . Adenosine myoview  01/2006    No ischemia, EF 70%  . Cataract extraction  09/2006    Right  . Coronary angioplasty  03/2007    3 vessel disease  . Coronary stent placement  04/2007    RCA/LCX multiple, [facility]  . Femoral hernia repair  05/2007    Incarcerated, right  . Fracture surgery  03/2009    Left leg     Family History  Problem Relation Age of  Onset  . Pulmonary embolism Mother   . Heart disease Father   . Heart disease Sister   . Cancer Brother   . Heart disease Sister      History   Social History  . Marital Status: Widowed    Spouse Name: N/A    Number of Children: 3  . Years of Education: N/A   Occupational History  . Not on file.   Social History Main Topics  . Smoking status: Never Smoker   . Smokeless tobacco: Never Used  . Alcohol Use: No  . Drug Use: No  . Sexual Activity: Not on file   Other Topics Concern  . Not on file   Social History Narrative   Widowed 2005 after long time care for husband after stroke   Remarried but seperated   Has 3 children      No formal living will   Daughter [name] should be her health care POA   Has DNR already   No feeding tube if cognitively unaware     PHYSICAL EXAM   BP 110/58  Pulse 61  Ht 5' 1.5" (1.562 m)  Wt 128 lb 8 oz (58.287 kg)  BMI 23.89 kg/m2 Constitutional: She is oriented to person, place, and time. She appears well-developed and well-nourished. No distress.  HENT: No nasal discharge.  Head: Normocephalic and atraumatic.  Eyes: Pupils are equal and round. Right eye exhibits no discharge. Left eye exhibits no discharge.  Neck: Normal range of motion. Neck supple. No JVD present. No thyromegaly present.  Cardiovascular: Normal rate, regular rhythm, normal heart sounds. Exam reveals no gallop and no friction rub. There is 2/6 SEM at aortic area.  Pulmonary/Chest: Effort normal and breath sounds normal. No stridor. No respiratory distress. She has no wheezes. She has no rales. She exhibits no tenderness.  Abdominal: Soft. Bowel sounds are normal. She exhibits no distension. There is no tenderness. There is no rebound and no guarding.  Musculoskeletal: Normal range of motion. She exhibits +1 edema worse on the left side and no tenderness.  Neurological: She is alert and oriented to person, place, and time. Coordination normal.  Skin: Skin is warm  and dry. No rash noted. She is not diaphoretic. No erythema. No pallor.  Psychiatric: She has a normal mood and affect. Her behavior is normal. Judgment  and thought content normal.   EKG: Sinus  Rhythm  -Borderline LVH   ASSESSMENT AND PLAN

## 2014-07-03 NOTE — Assessment & Plan Note (Signed)
Blood pressure is well controlled on current medications. I might consider stopping lisinopril if blood pressure remains on the low side.

## 2014-07-03 NOTE — Assessment & Plan Note (Signed)
She is doing significantly better after increasing the dose of Imdur to 60 mg once daily. She is currently on excellent antianginal therapy.

## 2014-07-03 NOTE — Patient Instructions (Signed)
Your physician wants you to follow-up in: 4 months with Dr. Kirke CorinArida. You will receive a reminder letter in the mail two months in advance. If you don't receive a letter, please call our office to schedule the follow-up appointment.

## 2014-07-03 NOTE — Assessment & Plan Note (Signed)
Lab Results  Component Value Date   CHOL 176 08/26/2013   HDL 51.90 08/26/2013   LDLCALC 88 08/26/2013   LDLDIRECT 77.8 01/28/2010   TRIG 182.0* 08/26/2013   CHOLHDL 3 08/26/2013   Continue treatment with small dose Crestor. 

## 2014-07-03 NOTE — Assessment & Plan Note (Signed)
I would only recommend revascularization if she is symptomatic with a stroke or TIA given her age. Continue medical therapy. The disease was mild to moderate overall.           

## 2014-08-21 ENCOUNTER — Other Ambulatory Visit: Payer: Self-pay | Admitting: Internal Medicine

## 2014-09-04 ENCOUNTER — Encounter: Payer: Self-pay | Admitting: Internal Medicine

## 2014-09-04 ENCOUNTER — Ambulatory Visit (INDEPENDENT_AMBULATORY_CARE_PROVIDER_SITE_OTHER): Payer: Medicare Other | Admitting: Internal Medicine

## 2014-09-04 VITALS — BP 148/70 | HR 65 | Temp 97.7°F | Ht 60.0 in | Wt 128.0 lb

## 2014-09-04 DIAGNOSIS — Z0000 Encounter for general adult medical examination without abnormal findings: Secondary | ICD-10-CM

## 2014-09-04 DIAGNOSIS — I10 Essential (primary) hypertension: Secondary | ICD-10-CM

## 2014-09-04 DIAGNOSIS — G3184 Mild cognitive impairment, so stated: Secondary | ICD-10-CM

## 2014-09-04 DIAGNOSIS — E785 Hyperlipidemia, unspecified: Secondary | ICD-10-CM

## 2014-09-04 DIAGNOSIS — Z7189 Other specified counseling: Secondary | ICD-10-CM

## 2014-09-04 DIAGNOSIS — Z733 Stress, not elsewhere classified: Secondary | ICD-10-CM

## 2014-09-04 DIAGNOSIS — I209 Angina pectoris, unspecified: Secondary | ICD-10-CM

## 2014-09-04 DIAGNOSIS — F439 Reaction to severe stress, unspecified: Secondary | ICD-10-CM

## 2014-09-04 DIAGNOSIS — I2510 Atherosclerotic heart disease of native coronary artery without angina pectoris: Secondary | ICD-10-CM

## 2014-09-04 DIAGNOSIS — Z23 Encounter for immunization: Secondary | ICD-10-CM

## 2014-09-04 DIAGNOSIS — I25119 Atherosclerotic heart disease of native coronary artery with unspecified angina pectoris: Secondary | ICD-10-CM

## 2014-09-04 LAB — CBC WITH DIFFERENTIAL/PLATELET
Basophils Absolute: 0.1 10*3/uL (ref 0.0–0.1)
Basophils Relative: 0.5 % (ref 0.0–3.0)
Eosinophils Absolute: 0.2 10*3/uL (ref 0.0–0.7)
Eosinophils Relative: 2.4 % (ref 0.0–5.0)
HEMATOCRIT: 39 % (ref 36.0–46.0)
Hemoglobin: 12.9 g/dL (ref 12.0–15.0)
Lymphocytes Relative: 15.4 % (ref 12.0–46.0)
Lymphs Abs: 1.4 10*3/uL (ref 0.7–4.0)
MCHC: 33.1 g/dL (ref 30.0–36.0)
MCV: 93.5 fl (ref 78.0–100.0)
MONOS PCT: 7.2 % (ref 3.0–12.0)
Monocytes Absolute: 0.7 10*3/uL (ref 0.1–1.0)
NEUTROS ABS: 7 10*3/uL (ref 1.4–7.7)
Neutrophils Relative %: 74.5 % (ref 43.0–77.0)
PLATELETS: 197 10*3/uL (ref 150.0–400.0)
RBC: 4.17 Mil/uL (ref 3.87–5.11)
RDW: 14.1 % (ref 11.5–15.5)
WBC: 9.3 10*3/uL (ref 4.0–10.5)

## 2014-09-04 LAB — LIPID PANEL
Cholesterol: 170 mg/dL (ref 0–200)
HDL: 40.7 mg/dL (ref >39.00)
LDL Cholesterol: 91 mg/dL (ref 0–99)
NONHDL: 129.3
Total CHOL/HDL Ratio: 4
Triglycerides: 190 mg/dL — ABNORMAL HIGH (ref 0.0–149.0)
VLDL: 38 mg/dL (ref 0.0–40.0)

## 2014-09-04 LAB — T4, FREE: FREE T4: 0.84 ng/dL (ref 0.60–1.60)

## 2014-09-04 LAB — COMPREHENSIVE METABOLIC PANEL
ALT: 15 U/L (ref 0–35)
AST: 20 U/L (ref 0–37)
Albumin: 4.1 g/dL (ref 3.5–5.2)
Alkaline Phosphatase: 70 U/L (ref 39–117)
BILIRUBIN TOTAL: 0.3 mg/dL (ref 0.2–1.2)
BUN: 26 mg/dL — ABNORMAL HIGH (ref 6–23)
CHLORIDE: 107 meq/L (ref 96–112)
CO2: 27 meq/L (ref 19–32)
Calcium: 9.4 mg/dL (ref 8.4–10.5)
Creatinine, Ser: 0.9 mg/dL (ref 0.4–1.2)
GFR: 59.13 mL/min — AB (ref >60.00)
Glucose, Bld: 113 mg/dL — ABNORMAL HIGH (ref 70–99)
Potassium: 4 mEq/L (ref 3.5–5.1)
Sodium: 141 mEq/L (ref 135–145)
TOTAL PROTEIN: 7.8 g/dL (ref 6.0–8.3)

## 2014-09-04 NOTE — Assessment & Plan Note (Signed)
Some memory problems but no functional decline Will just observe

## 2014-09-04 NOTE — Progress Notes (Signed)
Subjective:    Patient ID: Christina [name], female    DOB: August 11, 1920, 80 y.o.   MRN: [id number]  HPI Here for Medicare wellness, physical and follow up Reviewed form and advanced directives Has had some falls---discussed leg strengthening and cane Walks regularly and tends yard No alcohol or tobacco Still drives but not very often Independent with all other instrumental ADLs Vision is fair--needs large print for reading Hearing is good Stable mild memory problems---trouble with names  Just fell this AM around 4AM Got up to let dog out  [place] backwards and hit standup mirror Another fall earlier this year--hit left head on commode Uses cane to help balance  Still with intermittent angina but much less common on higher imdur Had to go to hospital in June for severe spell No nitro since then No dizziness or syncope No change in exercise tolerance  Son Christina [name] is still living with her He is not using drugs now (she states hopefully) Goes to support group in [place] Husband still comes over almost every day--but same strained situation with his daughter She does love him and is convinced he loves her (but is controlled by the daughter)  No problems with the statin  Current Outpatient Prescriptions on File Prior to Visit  Medication Sig Dispense Refill  . amLODipine (NORVASC) 5 MG tablet TAKE ONE TABLET BY MOUTH ONCE DAILY  90 tablet  3  . aspirin 81 MG EC tablet Take 81 mg by mouth daily.        . carvedilol (COREG) 3.125 MG tablet Take 1 tablet (3.125 mg total) by mouth 2 (two) times daily.  180 tablet  3  . CRESTOR 5 MG tablet TAKE ONE TABLET BY MOUTH ONCE DAILY  90 tablet  0  . Fish Oil OIL Take 2 capsules by mouth daily.        Marland Kitchen gabapentin (NEURONTIN) 300 MG capsule TAKE ONE CAPSULE BY MOUTH THREE TIMES DAILY  270 capsule  2  . isosorbide mononitrate (IMDUR) 60 MG 24 hr tablet Take 1 tablet (60 mg total) by mouth daily.  30 tablet  6  . lisinopril (PRINIVIL,ZESTRIL) 10 MG  tablet Take 1 tablet (10 mg total) by mouth daily.  30 tablet  6  . mometasone (NASONEX) 50 MCG/ACT nasal spray Place 2 sprays into the nose daily as needed.  17 g  11  . Multiple Vitamin (MULTIVITAMIN) capsule Take 1 capsule by mouth daily.        . nitroGLYCERIN (NITROSTAT) 0.4 MG SL tablet Place 1 tablet (0.4 mg total) under the tongue every 5 (five) minutes as needed for chest pain.  25 tablet  3   No current facility-administered medications on file prior to visit.    Allergies  Allergen Reactions  . Atorvastatin     REACTION: blisters on feet  . Diltiazem Hcl   . Dye Fdc Red [Red Dye]   . Gemfibrozil   . Niacin   . Sulfasalazine Rash    Past Medical History  Diagnosis Date  . CAD (coronary artery disease)     a. S/P previous MI;  b. 04/2007 Cath/PCI: Taxus DES' to mid/distal RCA and RPDA as well as Ramus;  c. myoview 8/09: EF 78%, normal perfusion  . Hypertension   . Osteoporosis   . Peripheral vascular disease   . Hyperlipidemia     Intolerant of many statins  . Chronic shoulder pain   . Chronic back pain   . Osteoarthritis   .  Neuropathy   . Mild mitral and aortic regurgitation     a. 11/2011 Echo: EF 55-65%, No RWMA, Gr 1 DD, Mild AI/MR.  . Carotid stenosis     Bilateral, mild to moderate    Past Surgical History  Procedure Laterality Date  . Carotid u/s  10/2006    0-39% bilaterally  . Vesicovaginal fistula closure w/ tah    . Coronary stent placement  2002    MI, [facility]  . Coronary angioplasty  12/2002    Medical rx only  . Breast biopsy      Bilateral  . Nm myoview ltd  11/2004    Stress, negative; NL EF  . Cp admit  09/2005    Stress myoview negative  . Coronary angioplasty  01/2006    LAD/PAD disease  . Adenosine myoview  01/2006    No ischemia, EF 70%  . Cataract extraction  09/2006    Right  . Coronary angioplasty  03/2007    3 vessel disease  . Coronary stent placement  04/2007    RCA/LCX multiple, [facility]  . Femoral hernia repair  05/2007      Incarcerated, right  . Fracture surgery  03/2009    Left leg    Family History  Problem Relation Age of Onset  . Pulmonary embolism Mother   . Heart disease Father   . Heart disease Sister   . Cancer Brother   . Heart disease Sister     History   Social History  . Marital Status: Widowed    Spouse Name: N/A    Number of Children: 3  . Years of Education: N/A   Occupational History  . Not on file.   Social History Main Topics  . Smoking status: Never Smoker   . Smokeless tobacco: Never Used  . Alcohol Use: No  . Drug Use: No  . Sexual Activity: Not on file   Other Topics Concern  . Not on file   Social History Narrative   Widowed 2005 after long time care for husband after stroke   Remarried but seperated   Has 3 children      No formal living will   Daughter [name] should be her health care POA   Has DNR already   No feeding tube if cognitively unaware   Review of Systems  Constitutional: Negative for fatigue and unexpected weight change.       Wears seat belt  HENT: Negative for dental problem, hearing loss and tinnitus.        Full dentures  Eyes: Negative for visual disturbance.  Respiratory: Negative for cough, chest tightness and shortness of breath.   Cardiovascular: Positive for chest pain and leg swelling. Negative for palpitations.       Some swelling in ankles when it is hot  Gastrointestinal: Negative for nausea, vomiting, abdominal pain, constipation and blood in stool.       No heartburn  Endocrine: Negative for polydipsia and polyuria.  Genitourinary: Positive for urgency. Negative for dysuria and hematuria.  Musculoskeletal: Positive for arthralgias and back pain. Negative for joint swelling.       Fingers are "drawing"---some distal contractures. Limited in yard work by back pain--has to rest  Skin: Negative for rash.       Scattered spots on skin Dog stepped on right wrist-- did cut her   Allergic/Immunologic: Positive for  environmental allergies. Negative for immunocompromised state.       Uses OTC allergy pill  Neurological:  Positive for weakness and headaches. Negative for dizziness, syncope, light-headedness and numbness.       Feels her arms are weak  Hematological: Negative for adenopathy. Does not bruise/bleed easily.  Psychiatric/Behavioral: Positive for sleep disturbance. Negative for dysphoric mood. The patient is nervous/anxious.        Objective:   Physical Exam  Constitutional: She is oriented to person, place, and time. She appears well-developed and well-nourished. No distress.  HENT:  Head: Normocephalic and atraumatic.  Right Ear: External ear normal.  Left Ear: External ear normal.  Mouth/Throat: Oropharynx is clear and moist. No oropharyngeal exudate.  Eyes: Conjunctivae and EOM are normal. Pupils are equal, round, and reactive to light.  Neck: Normal range of motion. Neck supple. No thyromegaly present.  Cardiovascular: Normal rate, regular rhythm, normal heart sounds and intact distal pulses.  Exam reveals no gallop.   No murmur heard. occ skips  Pulmonary/Chest: Effort normal and breath sounds normal. No respiratory distress. She has no wheezes. She has no rales.  Abdominal: Soft. There is no tenderness.  Musculoskeletal: She exhibits no edema and no tenderness.  Some trouble on and off table No focal weakness  Lymphadenopathy:    She has no cervical adenopathy.  Neurological: She is alert and oriented to person, place, and time.  President-- "Obama, Bush, ?" [phone] something Trouble spelling Recall 0/3  Skin: No rash noted. No erythema.  Psychiatric: She has a normal mood and affect. Her behavior is normal.          Assessment & Plan:

## 2014-09-04 NOTE — Assessment & Plan Note (Signed)
Son still with her--fortunately not using drugs now Worries about daughter---doesn't like her boyfriend and she is overwhelmed Husband still kept away by his daughter

## 2014-09-04 NOTE — Assessment & Plan Note (Signed)
See social history 

## 2014-09-04 NOTE — Assessment & Plan Note (Signed)
BP Readings from Last 3 Encounters:  09/04/14 148/70  07/03/14 110/58  05/25/14 123/68   Reasonable control for age

## 2014-09-04 NOTE — Assessment & Plan Note (Signed)
No problems with statin 

## 2014-09-04 NOTE — Assessment & Plan Note (Signed)
No CHF Less angina with higher imdur dose ---no apparent side effects

## 2014-09-04 NOTE — Assessment & Plan Note (Signed)
I have personally reviewed the Medicare Annual Wellness questionnaire and have noted 1. The patient's medical and social history 2. Their use of alcohol, tobacco or illicit drugs 3. Their current medications and supplements 4. The patient's functional ability including ADL's, fall risks, home safety risks and hearing or visual             impairment. 5. Diet and physical activities 6. Evidence for depression or mood disorders  The patients weight, height, BMI and visual acuity have been recorded in the chart I have made referrals, counseling and provided education to the patient based review of the above and I have provided the pt with a written personalized care plan for preventive services.  I have provided you with a copy of your personalized plan for preventive services. Please take the time to review along with your updated medication list.  Will give prevnar and flu She prefers not to get zostavax No cancer screening due to age On vitamin D in multivitamin ---will not check DEXA

## 2014-09-04 NOTE — Progress Notes (Signed)
Pre visit review using our clinic review tool, if applicable. No additional management support is needed unless otherwise documented below in the visit note. 

## 2014-09-04 NOTE — Addendum Note (Signed)
Addended by: Sueanne Margarita on: 09/04/2014 02:39 PM   Modules accepted: Orders

## 2014-09-07 ENCOUNTER — Encounter: Payer: Self-pay | Admitting: *Deleted

## 2014-10-13 ENCOUNTER — Encounter: Payer: Self-pay | Admitting: Cardiovascular Disease

## 2014-10-13 ENCOUNTER — Ambulatory Visit (INDEPENDENT_AMBULATORY_CARE_PROVIDER_SITE_OTHER): Payer: Medicare Other | Admitting: Cardiovascular Disease

## 2014-10-13 VITALS — BP 135/76 | HR 63 | Ht 61.0 in | Wt 128.2 lb

## 2014-10-13 DIAGNOSIS — E785 Hyperlipidemia, unspecified: Secondary | ICD-10-CM

## 2014-10-13 DIAGNOSIS — I25119 Atherosclerotic heart disease of native coronary artery with unspecified angina pectoris: Secondary | ICD-10-CM

## 2014-10-13 DIAGNOSIS — I6523 Occlusion and stenosis of bilateral carotid arteries: Secondary | ICD-10-CM

## 2014-10-13 DIAGNOSIS — I10 Essential (primary) hypertension: Secondary | ICD-10-CM

## 2014-10-13 DIAGNOSIS — M79601 Pain in right arm: Secondary | ICD-10-CM

## 2014-10-13 NOTE — Patient Instructions (Signed)
Continue same medications.   Your physician wants you to follow-up in: 6 months.  You will receive a reminder letter in the mail two months in advance. If you don't receive a letter, please call our office to schedule the follow-up appointment.  Your next appointment will be scheduled in our new office located at :  [facility]  [address], [address]  [place], [place] [address]  

## 2014-10-13 NOTE — Progress Notes (Signed)
HPI  80 yo with history of CAD presents for cardiology followup. She had an MI in 5/08 with PCI to ramus and RCA.  She has a chronic pattern of occasional nonexertional chest pain which requires occasional use of sublingual nitroglycerin. She had an echocardiogram done in 2013 which showed normal LV systolic function with mild mitral and aortic insufficiency. She was seen in early 2014 due to  increased chest pain and dyspnea. She underwent a pharmacologic nuclear stress test in February which showed no evidence of ischemia with normal ejection fraction. There was a fixed basal lateral wall defect likely due to a prior small infarct. She was noted to have faint carotid bruits. Carotid duplex showed mild to moderate disease bilaterally.  She has been doing very well overall with no chest pain or shortness of breath.   Allergies  Allergen Reactions  . Atorvastatin     REACTION: blisters on feet  . Diltiazem Hcl   . Dye Fdc Red [Red Dye]   . Gemfibrozil   . Niacin   . Sulfasalazine Rash     Current Outpatient Prescriptions on File Prior to Visit  Medication Sig Dispense Refill  . amLODipine (NORVASC) 5 MG tablet TAKE ONE TABLET BY MOUTH ONCE DAILY  90 tablet  3  . aspirin 81 MG EC tablet Take 81 mg by mouth daily.        . carvedilol (COREG) 3.125 MG tablet Take 1 tablet (3.125 mg total) by mouth 2 (two) times daily.  180 tablet  3  . CRESTOR 5 MG tablet TAKE ONE TABLET BY MOUTH ONCE DAILY  90 tablet  0  . Fish Oil OIL Take 2 capsules by mouth daily.        Marland Kitchen. gabapentin (NEURONTIN) 300 MG capsule TAKE ONE CAPSULE BY MOUTH THREE TIMES DAILY  270 capsule  2  . isosorbide mononitrate (IMDUR) 60 MG 24 hr tablet Take 1 tablet (60 mg total) by mouth daily.  30 tablet  6  . lisinopril (PRINIVIL,ZESTRIL) 10 MG tablet Take 1 tablet (10 mg total) by mouth daily.  30 tablet  6  . mometasone (NASONEX) 50 MCG/ACT nasal spray Place 2 sprays into the nose daily as needed.  17 g  11  . Multiple  Vitamin (MULTIVITAMIN) capsule Take 1 capsule by mouth daily.        . nitroGLYCERIN (NITROSTAT) 0.4 MG SL tablet Place 1 tablet (0.4 mg total) under the tongue every 5 (five) minutes as needed for chest pain.  25 tablet  3   No current facility-administered medications on file prior to visit.     Past Medical History  Diagnosis Date  . CAD (coronary artery disease)     a. S/P previous MI;  b. 04/2007 Cath/PCI: Taxus DES' to mid/distal RCA and RPDA as well as Ramus;  c. myoview 8/09: EF 78%, normal perfusion  . Hypertension   . Osteoporosis   . Peripheral vascular disease   . Hyperlipidemia     Intolerant of many statins  . Chronic shoulder pain   . Chronic back pain   . Osteoarthritis   . Neuropathy   . Mild mitral and aortic regurgitation     a. 11/2011 Echo: EF 55-65%, No RWMA, Gr 1 DD, Mild AI/MR.  . Carotid stenosis     Bilateral, mild to moderate     Past Surgical History  Procedure Laterality Date  . Carotid u/s  10/2006    0-39% bilaterally  . Vesicovaginal  fistula closure w/ tah    . Coronary stent placement  2002    MI, [facility]  . Coronary angioplasty  12/2002    Medical rx only  . Breast biopsy      Bilateral  . Nm myoview ltd  11/2004    Stress, negative; NL EF  . Cp admit  09/2005    Stress myoview negative  . Coronary angioplasty  01/2006    LAD/PAD disease  . Adenosine myoview  01/2006    No ischemia, EF 70%  . Cataract extraction  09/2006    Right  . Coronary angioplasty  03/2007    3 vessel disease  . Coronary stent placement  04/2007    RCA/LCX multiple, [facility]  . Femoral hernia repair  05/2007    Incarcerated, right  . Fracture surgery  03/2009    Left leg     Family History  Problem Relation Age of Onset  . Pulmonary embolism Mother   . Heart disease Father   . Heart disease Sister   . Cancer Brother   . Heart disease Sister      History   Social History  . Marital Status: Widowed    Spouse Name: N/A    Number of Children: 3    . Years of Education: N/A   Occupational History  . Not on file.   Social History Main Topics  . Smoking status: Never Smoker   . Smokeless tobacco: Never Used  . Alcohol Use: No  . Drug Use: No  . Sexual Activity: Not on file   Other Topics Concern  . Not on file   Social History Narrative   Widowed 2005 after long time care for husband after stroke   Remarried but seperated   Has 3 children      No formal living will   Daughter Carney BernJean should be her health care POA   Has DNR already   No feeding tube if cognitively unaware     PHYSICAL EXAM   BP 135/76  Pulse 63  Ht 5\' 1"  (1.549 m)  Wt 128 lb 4 oz (58.174 kg)  BMI 24.25 kg/m2 Constitutional: She is oriented to person, place, and time. She appears well-developed and well-nourished. No distress.  HENT: No nasal discharge.  Head: Normocephalic and atraumatic.  Eyes: Pupils are equal and round. Right eye exhibits no discharge. Left eye exhibits no discharge.  Neck: Normal range of motion. Neck supple. No JVD present. No thyromegaly present. Left carotid bruit Cardiovascular: Normal rate, regular rhythm, normal heart sounds. Exam reveals no gallop and no friction rub. There is 2/6 SEM at aortic area.  Pulmonary/Chest: Effort normal and breath sounds normal. No stridor. No respiratory distress. She has no wheezes. She has no rales. She exhibits no tenderness.  Abdominal: Soft. Bowel sounds are normal. She exhibits no distension. There is no tenderness. There is no rebound and no guarding.  Musculoskeletal: Normal range of motion. She exhibits +1 edema worse on the left side and no tenderness.  Neurological: She is alert and oriented to person, place, and time. Coordination normal.  Skin: Skin is warm and dry. No rash noted. She is not diaphoretic. No erythema. No pallor.  Psychiatric: She has a normal mood and affect. Her behavior is normal. Judgment and thought content normal.   EKG: Sinus  Rhythm  WITHIN NORMAL  LIMITS   ASSESSMENT AND PLAN

## 2014-10-15 NOTE — Assessment & Plan Note (Signed)
I would only recommend revascularization if she is symptomatic with a stroke or TIA given her age. Continue medical therapy. The disease was mild to moderate overall.

## 2014-10-15 NOTE — Assessment & Plan Note (Signed)
Blood pressure is well controlled on current medications. 

## 2014-10-15 NOTE — Assessment & Plan Note (Signed)
Lab Results  Component Value Date   CHOL 170 09/04/2014   HDL 40.70 09/04/2014   LDLCALC 91 09/04/2014   LDLDIRECT 77.8 01/28/2010   TRIG 190.0* 09/04/2014   CHOLHDL 4 09/04/2014   Continue treatment with Crestor. LDL is close to target.

## 2014-10-15 NOTE — Assessment & Plan Note (Signed)
Angina has been well-controlled on medical therapy. She reports no new symptoms. Continue current medications.

## 2014-10-17 ENCOUNTER — Other Ambulatory Visit: Payer: Self-pay | Admitting: Internal Medicine

## 2014-11-02 ENCOUNTER — Other Ambulatory Visit: Payer: Self-pay | Admitting: Cardiovascular Disease

## 2014-11-05 ENCOUNTER — Ambulatory Visit: Payer: Medicare Other | Admitting: Cardiovascular Disease

## 2014-11-25 ENCOUNTER — Telehealth: Payer: Self-pay

## 2014-11-25 NOTE — Telephone Encounter (Signed)
[facility] pt wants to start getting 90 day rx on her meds with [facility]; fax # [phone]. Brett CanalesSteve said pt does not need refills now and will cb when refills needed.

## 2014-11-25 NOTE — Telephone Encounter (Signed)
Pt wants to change to mail order pharmacy but pt does not know name of mail order pharmacy; pt will get name of pharmacy and cb.

## 2014-12-02 ENCOUNTER — Other Ambulatory Visit: Payer: Self-pay

## 2014-12-02 MED ORDER — AMLODIPINE BESYLATE 5 MG PO TABS: 5.0000 mg | ORAL_TABLET | Freq: Every day | ORAL | Status: DC

## 2014-12-02 MED ORDER — CARVEDILOL 3.125 MG PO TABS: 3.1250 mg | ORAL_TABLET | Freq: Two times a day (BID) | ORAL | Status: DC

## 2014-12-02 MED ORDER — ROSUVASTATIN CALCIUM 5 MG PO TABS: 5.0000 mg | ORAL_TABLET | Freq: Every day | ORAL | Status: DC

## 2014-12-02 MED ORDER — LISINOPRIL 10 MG PO TABS: 10.0000 mg | ORAL_TABLET | Freq: Every day | ORAL | Status: DC

## 2014-12-02 MED ORDER — GABAPENTIN 300 MG PO CAPS: ORAL_CAPSULE | ORAL | Status: DC

## 2014-12-02 MED ORDER — ISOSORBIDE MONONITRATE ER 60 MG PO TB24: 60.0000 mg | ORAL_TABLET | Freq: Every day | ORAL | Status: DC

## 2014-12-02 NOTE — Telephone Encounter (Signed)
Christina CanalesSteve pts son request refill amlodipine,lisinopril,carvedilol,crestor,isosorbide to [facility]. Advised done. Also request 90 day refill gabapentin to [facility].Please advise. Pt seen 09/04/14 for physical. [name] [address] to cancel existing refills on these meds.

## 2014-12-02 NOTE — Telephone Encounter (Signed)
rx sent to pharmacy by e-script  

## 2014-12-02 NOTE — Telephone Encounter (Signed)
Approved:  Okay for a year also

## 2014-12-31 DIAGNOSIS — Z961 Presence of intraocular lens: Secondary | ICD-10-CM | POA: Diagnosis not present

## 2015-01-18 ENCOUNTER — Telehealth: Payer: Self-pay | Admitting: Cardiovascular Disease

## 2015-01-18 NOTE — Telephone Encounter (Signed)
LVM 2/1 

## 2015-01-18 NOTE — Telephone Encounter (Signed)
Pt son calling stating that they got letter in mail asking if she would like to do a free screening for heart problems.  Screening of the  inside of your arteries. They detect problems before it happened.   Pt son is asking is this even necessary. Please call

## 2015-02-05 NOTE — Telephone Encounter (Signed)
Patient stated she would not like the screening at this time

## 2015-02-11 ENCOUNTER — Telehealth: Payer: Self-pay | Admitting: *Deleted

## 2015-02-11 NOTE — Telephone Encounter (Signed)
Lmom to call our office. Time to schedule a 1 yr f/u carotid doppler.

## 2015-02-15 ENCOUNTER — Other Ambulatory Visit: Payer: Self-pay | Admitting: Radiology

## 2015-02-15 DIAGNOSIS — I6523 Occlusion and stenosis of bilateral carotid arteries: Secondary | ICD-10-CM

## 2015-02-25 ENCOUNTER — Encounter (INDEPENDENT_AMBULATORY_CARE_PROVIDER_SITE_OTHER): Payer: Medicare Other

## 2015-02-25 DIAGNOSIS — I6523 Occlusion and stenosis of bilateral carotid arteries: Secondary | ICD-10-CM | POA: Diagnosis not present

## 2015-03-04 ENCOUNTER — Ambulatory Visit (INDEPENDENT_AMBULATORY_CARE_PROVIDER_SITE_OTHER): Payer: Medicare Other | Admitting: Internal Medicine

## 2015-03-04 ENCOUNTER — Encounter: Payer: Self-pay | Admitting: Internal Medicine

## 2015-03-04 VITALS — BP 138/60 | HR 71 | Temp 98.2°F | Wt 133.0 lb

## 2015-03-04 DIAGNOSIS — M150 Primary generalized (osteo)arthritis: Secondary | ICD-10-CM | POA: Diagnosis not present

## 2015-03-04 DIAGNOSIS — I25119 Atherosclerotic heart disease of native coronary artery with unspecified angina pectoris: Secondary | ICD-10-CM | POA: Diagnosis not present

## 2015-03-04 DIAGNOSIS — G3184 Mild cognitive impairment, so stated: Secondary | ICD-10-CM

## 2015-03-04 DIAGNOSIS — E785 Hyperlipidemia, unspecified: Secondary | ICD-10-CM | POA: Diagnosis not present

## 2015-03-04 DIAGNOSIS — M159 Polyosteoarthritis, unspecified: Secondary | ICD-10-CM | POA: Insufficient documentation

## 2015-03-04 NOTE — Progress Notes (Signed)
Pre visit review using our clinic review tool, if applicable. No additional management support is needed unless otherwise documented below in the visit note. 

## 2015-03-04 NOTE — Assessment & Plan Note (Signed)
Rare stable angina Overall good functional status---still does her yard and garden

## 2015-03-04 NOTE — Progress Notes (Signed)
Subjective:    Patient ID: Christina RenshawRuth J Albino, female    DOB: [birth year], 81 y.o.   MRN: [id number]  HPI Here for follow up of CAD and other problems  Son Brett CanalesSteve still living with her He has remained on his methadone program. He is a great resource and help to her  Husband comes over every day but stays with daughter still He will be 89! Drives around the block between the 2 homes  She doesn't drive now   Does get occasional chest pain--nitro will relieve Still does some yard work--flowers, etc Breathing has been fine---will get stable DOE   Mild memory issues has not worsened Mostly problems remembering names Daughter has taken over the finances  Current Outpatient Prescriptions on File Prior to Visit  Medication Sig Dispense Refill  . amLODipine (NORVASC) 5 MG tablet Take 1 tablet (5 mg total) by mouth daily. 90 tablet 1  . aspirin 81 MG EC tablet Take 81 mg by mouth daily.      . carvedilol (COREG) 3.125 MG tablet Take 1 tablet (3.125 mg total) by mouth 2 (two) times daily. 180 tablet 1  . Fish Oil OIL Take 2 capsules by mouth daily.      Marland Kitchen. gabapentin (NEURONTIN) 300 MG capsule TAKE ONE CAPSULE BY MOUTH THREE TIMES DAILY 270 capsule 1  . isosorbide mononitrate (IMDUR) 60 MG 24 hr tablet Take 1 tablet (60 mg total) by mouth daily. 90 tablet 1  . lisinopril (PRINIVIL,ZESTRIL) 10 MG tablet Take 1 tablet (10 mg total) by mouth daily. 90 tablet 1  . mometasone (NASONEX) 50 MCG/ACT nasal spray Place 2 sprays into the nose daily as needed. 17 g 11  . Multiple Vitamin (MULTIVITAMIN) capsule Take 1 capsule by mouth daily.      . nitroGLYCERIN (NITROSTAT) 0.4 MG SL tablet Place 1 tablet (0.4 mg total) under the tongue every 5 (five) minutes as needed for chest pain. 25 tablet 3  . rosuvastatin (CRESTOR) 5 MG tablet Take 1 tablet (5 mg total) by mouth daily. 90 tablet 1   No current facility-administered medications on file prior to visit.    Allergies  Allergen Reactions  .  Atorvastatin     REACTION: blisters on feet  . Diltiazem Hcl   . Dye Fdc Red [Red Dye]   . Gemfibrozil   . Niacin   . Sulfasalazine Rash    Past Medical History  Diagnosis Date  . CAD (coronary artery disease)     a. S/P previous MI;  b. 04/2007 Cath/PCI: Taxus DES' to mid/distal RCA and RPDA as well as Ramus;  c. myoview 8/09: EF 78%, normal perfusion  . Hypertension   . Osteoporosis   . Peripheral vascular disease   . Hyperlipidemia     Intolerant of many statins  . Chronic shoulder pain   . Chronic back pain   . Osteoarthritis   . Neuropathy   . Mild mitral and aortic regurgitation     a. 11/2011 Echo: EF 55-65%, No RWMA, Gr 1 DD, Mild AI/MR.  . Carotid stenosis     Bilateral, mild to moderate    Past Surgical History  Procedure Laterality Date  . Carotid u/s  10/2006    0-39% bilaterally  . Vesicovaginal fistula closure w/ tah    . Coronary stent placement  2002    MI, [facility]  . Coronary angioplasty  12/2002    Medical rx only  . Breast biopsy      Bilateral  .  Nm myoview ltd  11/2004    Stress, negative; NL EF  . Cp admit  09/2005    Stress myoview negative  . Coronary angioplasty  01/2006    LAD/PAD disease  . Adenosine myoview  01/2006    No ischemia, EF 70%  . Cataract extraction  09/2006    Right  . Coronary angioplasty  03/2007    3 vessel disease  . Coronary stent placement  04/2007    RCA/LCX multiple, [facility]  . Femoral hernia repair  05/2007    Incarcerated, right  . Fracture surgery  03/2009    Left leg    Family History  Problem Relation Age of Onset  . Pulmonary embolism Mother   . Heart disease Father   . Heart disease Sister   . Cancer Brother   . Heart disease Sister     History   Social History  . Marital Status: Widowed    Spouse Name: N/A  . Number of Children: 3  . Years of Education: N/A   Occupational History  . Not on file.   Social History Main Topics  . Smoking status: Never Smoker   . Smokeless tobacco: Never  Used  . Alcohol Use: No  . Drug Use: No  . Sexual Activity: Not on file   Other Topics Concern  . Not on file   Social History Narrative   Widowed 2005 after long time care for husband after stroke   Remarried but seperated   Has 3 children      No formal living will   Daughter [name] should be her health care POA   Has DNR already   No feeding tube if cognitively unaware   Review of Systems Does not arthritic pain in hip and hands at times-- takes tylenol Sleeps well--uses melatonin Appetite is "too good" Has gained 5#    Objective:   Physical Exam  Constitutional: She appears well-developed and well-nourished. No distress.  Neck: Normal range of motion. Neck supple. No thyromegaly present.  Cardiovascular: Normal rate, regular rhythm and normal heart sounds.  Exam reveals no gallop.   No murmur heard. Pulmonary/Chest: Effort normal and breath sounds normal. No respiratory distress. She has no wheezes. She has no rales.  Abdominal: Soft. There is no tenderness.  Musculoskeletal: She exhibits no edema or tenderness.  Lymphadenopathy:    She has no cervical adenopathy.  Psychiatric: She has a normal mood and affect. Her behavior is normal.          Assessment & Plan:

## 2015-03-04 NOTE — Assessment & Plan Note (Signed)
Hands and hips mostly Gets some relief from tylenol Confirmed she is not taking NSAIDs

## 2015-03-04 NOTE — Assessment & Plan Note (Signed)
No apparent functional decline or executive function issues

## 2015-03-04 NOTE — Assessment & Plan Note (Signed)
No myalgia or GI problems Lab Results  Component Value Date   LDLCALC 91 09/04/2014   Good control

## 2015-04-12 ENCOUNTER — Ambulatory Visit (INDEPENDENT_AMBULATORY_CARE_PROVIDER_SITE_OTHER): Payer: Medicare Other | Admitting: Cardiovascular Disease

## 2015-04-12 ENCOUNTER — Encounter: Payer: Self-pay | Admitting: Cardiovascular Disease

## 2015-04-12 VITALS — BP 140/64 | HR 58 | Ht 61.0 in | Wt 130.5 lb

## 2015-04-12 DIAGNOSIS — I10 Essential (primary) hypertension: Secondary | ICD-10-CM | POA: Diagnosis not present

## 2015-04-12 DIAGNOSIS — I6523 Occlusion and stenosis of bilateral carotid arteries: Secondary | ICD-10-CM | POA: Diagnosis not present

## 2015-04-12 DIAGNOSIS — E785 Hyperlipidemia, unspecified: Secondary | ICD-10-CM | POA: Diagnosis not present

## 2015-04-12 DIAGNOSIS — I25119 Atherosclerotic heart disease of native coronary artery with unspecified angina pectoris: Secondary | ICD-10-CM | POA: Diagnosis not present

## 2015-04-12 NOTE — Assessment & Plan Note (Signed)
Blood pressure is reasonably controlled on current medications. 

## 2015-04-12 NOTE — Patient Instructions (Signed)
Medication Instructions:  None  Labwork: None  Testing/Procedures: None  Follow-Up: Your physician wants you to follow-up in: 6 months with Dr. Arida.  You will receive a reminder letter in the mail two months in advance. If you don't receive a letter, please call our office to schedule the follow-up appointment.   Any Other Special Instructions Will Be Listed Below (If Applicable).   

## 2015-04-12 NOTE — Progress Notes (Signed)
HPI  81 yo with history of CAD presents for cardiology followup. She had an MI in 5/08 with PCI to ramus and RCA.  She has a chronic pattern of occasional nonexertional chest pain which requires occasional use of sublingual nitroglycerin. She had an echocardiogram done in 2013 which showed normal LV systolic function with mild mitral and aortic insufficiency. She was seen in early 2014 due to  increased chest pain and dyspnea. She underwent a pharmacologic nuclear stress test  which showed no evidence of ischemia with normal ejection fraction. There was a fixed basal lateral wall defect likely due to a prior small infarct. She was noted to have faint carotid bruits. Carotid duplex showed mild to moderate disease bilaterally.  She has been doing very well overall with no chest pain . She is recovering from a recent cold. She is mildly bradycardic with only occasional dizziness.   Allergies  Allergen Reactions  . Atorvastatin     REACTION: blisters on feet  . Diltiazem Hcl   . Dye Fdc Red [Red Dye]   . Gemfibrozil   . Niacin   . Sulfasalazine Rash     Current Outpatient Prescriptions on File Prior to Visit  Medication Sig Dispense Refill  . amLODipine (NORVASC) 5 MG tablet Take 1 tablet (5 mg total) by mouth daily. 90 tablet 1  . aspirin 81 MG EC tablet Take 81 mg by mouth daily.      . carvedilol (COREG) 3.125 MG tablet Take 1 tablet (3.125 mg total) by mouth 2 (two) times daily. 180 tablet 1  . Fish Oil OIL Take 2 capsules by mouth daily.      Marland Kitchen gabapentin (NEURONTIN) 300 MG capsule TAKE ONE CAPSULE BY MOUTH THREE TIMES DAILY 270 capsule 1  . isosorbide mononitrate (IMDUR) 60 MG 24 hr tablet Take 1 tablet (60 mg total) by mouth daily. 90 tablet 1  . lisinopril (PRINIVIL,ZESTRIL) 10 MG tablet Take 1 tablet (10 mg total) by mouth daily. 90 tablet 1  . Melatonin 1 MG TABS Take by mouth as needed.    . mometasone (NASONEX) 50 MCG/ACT nasal spray Place 2 sprays into the nose daily as  needed. 17 g 11  . Multiple Vitamin (MULTIVITAMIN) capsule Take 1 capsule by mouth daily.      . nitroGLYCERIN (NITROSTAT) 0.4 MG SL tablet Place 1 tablet (0.4 mg total) under the tongue every 5 (five) minutes as needed for chest pain. 25 tablet 3  . rosuvastatin (CRESTOR) 5 MG tablet Take 1 tablet (5 mg total) by mouth daily. 90 tablet 1   No current facility-administered medications on file prior to visit.     Past Medical History  Diagnosis Date  . CAD (coronary artery disease)     a. S/P previous MI;  b. 04/2007 Cath/PCI: Taxus DES' to mid/distal RCA and RPDA as well as Ramus;  c. myoview 8/09: EF 78%, normal perfusion  . Hypertension   . Osteoporosis   . Peripheral vascular disease   . Hyperlipidemia     Intolerant of many statins  . Chronic shoulder pain   . Chronic back pain   . Osteoarthritis   . Neuropathy   . Mild mitral and aortic regurgitation     a. 11/2011 Echo: EF 55-65%, No RWMA, Gr 1 DD, Mild AI/MR.  . Carotid stenosis     Bilateral, mild to moderate     Past Surgical History  Procedure Laterality Date  . Carotid u/s  10/2006  0-39% bilaterally  . Vesicovaginal fistula closure w/ tah    . Coronary stent placement  2002    MI, [facility]  . Coronary angioplasty  12/2002    Medical rx only  . Breast biopsy      Bilateral  . Nm myoview ltd  11/2004    Stress, negative; NL EF  . Cp admit  09/2005    Stress myoview negative  . Coronary angioplasty  01/2006    LAD/PAD disease  . Adenosine myoview  01/2006    No ischemia, EF 70%  . Cataract extraction  09/2006    Right  . Coronary angioplasty  03/2007    3 vessel disease  . Coronary stent placement  04/2007    RCA/LCX multiple, [facility]  . Femoral hernia repair  05/2007    Incarcerated, right  . Fracture surgery  03/2009    Left leg     Family History  Problem Relation Age of Onset  . Pulmonary embolism Mother   . Heart disease Father   . Heart disease Sister   . Cancer Brother   . Heart disease  Sister      History   Social History  . Marital Status: Widowed    Spouse Name: N/A  . Number of Children: 3  . Years of Education: N/A   Occupational History  . Not on file.   Social History Main Topics  . Smoking status: Never Smoker   . Smokeless tobacco: Never Used  . Alcohol Use: No  . Drug Use: No  . Sexual Activity: Not on file   Other Topics Concern  . Not on file   Social History Narrative   Widowed 2005 after long time care for husband after stroke   Remarried but seperated   Has 3 children      No formal living will   Daughter Carney BernJean should be her health care POA   Has DNR already   No feeding tube if cognitively unaware     PHYSICAL EXAM   BP 140/64 mmHg  Pulse 58  Ht 5\' 1"  (1.549 m)  Wt 130 lb 8 oz (59.194 kg)  BMI 24.67 kg/m2 Constitutional: She is oriented to person, place, and time. She appears well-developed and well-nourished. No distress.  HENT: No nasal discharge.  Head: Normocephalic and atraumatic.  Eyes: Pupils are equal and round. Right eye exhibits no discharge. Left eye exhibits no discharge.  Neck: Normal range of motion. Neck supple. No JVD present. No thyromegaly present. Left carotid bruit Cardiovascular: Normal rate, regular rhythm, normal heart sounds. Exam reveals no gallop and no friction rub. There is 2/6 SEM at aortic area.  Pulmonary/Chest: Effort normal and breath sounds normal. No stridor. No respiratory distress. She has no wheezes. She has no rales. She exhibits no tenderness.  Abdominal: Soft. Bowel sounds are normal. She exhibits no distension. There is no tenderness. There is no rebound and no guarding.  Musculoskeletal: Normal range of motion. She exhibits +1 edema worse on the left side and no tenderness.  Neurological: She is alert and oriented to person, place, and time. Coordination normal.  Skin: Skin is warm and dry. No rash noted. She is not diaphoretic. No erythema. No pallor.  Psychiatric: She has a normal  mood and affect. Her behavior is normal. Judgment and thought content normal.   EKG: Sinus  Bradycardia  Low voltage in precordial leads.   ABNORMAL    ASSESSMENT AND PLAN

## 2015-04-12 NOTE — Assessment & Plan Note (Signed)
Lab Results  Component Value Date   CHOL 170 09/04/2014   HDL 40.70 09/04/2014   LDLCALC 91 09/04/2014   LDLDIRECT 77.8 01/28/2010   TRIG 190.0* 09/04/2014   CHOLHDL 4 09/04/2014   Lipid profile was acceptable on current dose of rosuvastatin.

## 2015-04-12 NOTE — Assessment & Plan Note (Signed)
She is doing reasonably well with no significant angina. I recommend continuing medical therapy.

## 2015-04-12 NOTE — Assessment & Plan Note (Signed)
This was stable on most recent carotid Doppler in March of this year. A follow-up carotid Doppler is recommended in 2 years if functional capacity continues to be good.

## 2015-04-19 ENCOUNTER — Other Ambulatory Visit: Payer: Self-pay | Admitting: Internal Medicine

## 2015-05-19 DIAGNOSIS — S329XXA Fracture of unspecified parts of lumbosacral spine and pelvis, initial encounter for closed fracture: Secondary | ICD-10-CM

## 2015-05-19 HISTORY — DX: Fracture of unspecified parts of lumbosacral spine and pelvis, initial encounter for closed fracture: S32.9XXA

## 2015-06-09 ENCOUNTER — Emergency Department: Payer: Medicare Other

## 2015-06-09 ENCOUNTER — Encounter: Payer: Self-pay | Admitting: Emergency Medicine

## 2015-06-09 ENCOUNTER — Inpatient Hospital Stay
Admission: EM | Admit: 2015-06-09 | Discharge: 2015-06-11 | DRG: 536 | Disposition: A | Discharge status: Skilled Nursing Facility | Payer: Medicare Other | Attending: Internal Medicine | Admitting: Internal Medicine

## 2015-06-09 DIAGNOSIS — I25119 Atherosclerotic heart disease of native coronary artery with unspecified angina pectoris: Secondary | ICD-10-CM | POA: Diagnosis present

## 2015-06-09 DIAGNOSIS — I340 Nonrheumatic mitral (valve) insufficiency: Secondary | ICD-10-CM | POA: Diagnosis not present

## 2015-06-09 DIAGNOSIS — I739 Peripheral vascular disease, unspecified: Secondary | ICD-10-CM | POA: Diagnosis not present

## 2015-06-09 DIAGNOSIS — Z9181 History of falling: Secondary | ICD-10-CM | POA: Diagnosis not present

## 2015-06-09 DIAGNOSIS — E785 Hyperlipidemia, unspecified: Secondary | ICD-10-CM | POA: Diagnosis present

## 2015-06-09 DIAGNOSIS — S79912A Unspecified injury of left hip, initial encounter: Secondary | ICD-10-CM | POA: Diagnosis not present

## 2015-06-09 DIAGNOSIS — M1990 Unspecified osteoarthritis, unspecified site: Secondary | ICD-10-CM | POA: Diagnosis not present

## 2015-06-09 DIAGNOSIS — S3219XA Other fracture of sacrum, initial encounter for closed fracture: Secondary | ICD-10-CM | POA: Diagnosis not present

## 2015-06-09 DIAGNOSIS — R9431 Abnormal electrocardiogram [ECG] [EKG]: Secondary | ICD-10-CM | POA: Diagnosis not present

## 2015-06-09 DIAGNOSIS — R2689 Other abnormalities of gait and mobility: Secondary | ICD-10-CM | POA: Diagnosis not present

## 2015-06-09 DIAGNOSIS — Y92002 Bathroom of unspecified non-institutional (private) residence single-family (private) house as the place of occurrence of the external cause: Secondary | ICD-10-CM

## 2015-06-09 DIAGNOSIS — S32810A Multiple fractures of pelvis with stable disruption of pelvic ring, initial encounter for closed fracture: Secondary | ICD-10-CM

## 2015-06-09 DIAGNOSIS — M80052D Age-related osteoporosis with current pathological fracture, left femur, subsequent encounter for fracture with routine healing: Secondary | ICD-10-CM | POA: Diagnosis not present

## 2015-06-09 DIAGNOSIS — S3282XA Multiple fractures of pelvis without disruption of pelvic ring, initial encounter for closed fracture: Secondary | ICD-10-CM | POA: Diagnosis not present

## 2015-06-09 DIAGNOSIS — W19XXXA Unspecified fall, initial encounter: Secondary | ICD-10-CM

## 2015-06-09 DIAGNOSIS — W1830XA Fall on same level, unspecified, initial encounter: Secondary | ICD-10-CM | POA: Diagnosis present

## 2015-06-09 DIAGNOSIS — I10 Essential (primary) hypertension: Secondary | ICD-10-CM | POA: Diagnosis not present

## 2015-06-09 DIAGNOSIS — I351 Nonrheumatic aortic (valve) insufficiency: Secondary | ICD-10-CM | POA: Diagnosis not present

## 2015-06-09 DIAGNOSIS — I252 Old myocardial infarction: Secondary | ICD-10-CM | POA: Diagnosis not present

## 2015-06-09 DIAGNOSIS — T148 Other injury of unspecified body region: Secondary | ICD-10-CM | POA: Diagnosis not present

## 2015-06-09 DIAGNOSIS — S0083XA Contusion of other part of head, initial encounter: Secondary | ICD-10-CM | POA: Diagnosis not present

## 2015-06-09 DIAGNOSIS — I2510 Atherosclerotic heart disease of native coronary artery without angina pectoris: Secondary | ICD-10-CM | POA: Diagnosis not present

## 2015-06-09 DIAGNOSIS — M25552 Pain in left hip: Secondary | ICD-10-CM | POA: Diagnosis not present

## 2015-06-09 DIAGNOSIS — Z7982 Long term (current) use of aspirin: Secondary | ICD-10-CM

## 2015-06-09 DIAGNOSIS — M810 Age-related osteoporosis without current pathological fracture: Secondary | ICD-10-CM | POA: Diagnosis present

## 2015-06-09 DIAGNOSIS — Z955 Presence of coronary angioplasty implant and graft: Secondary | ICD-10-CM

## 2015-06-09 DIAGNOSIS — G629 Polyneuropathy, unspecified: Secondary | ICD-10-CM | POA: Diagnosis not present

## 2015-06-09 DIAGNOSIS — M6281 Muscle weakness (generalized): Secondary | ICD-10-CM | POA: Diagnosis not present

## 2015-06-09 DIAGNOSIS — R52 Pain, unspecified: Secondary | ICD-10-CM

## 2015-06-09 DIAGNOSIS — S72009A Fracture of unspecified part of neck of unspecified femur, initial encounter for closed fracture: Secondary | ICD-10-CM | POA: Diagnosis present

## 2015-06-09 DIAGNOSIS — M25559 Pain in unspecified hip: Secondary | ICD-10-CM | POA: Diagnosis not present

## 2015-06-09 DIAGNOSIS — M8008XD Age-related osteoporosis with current pathological fracture, vertebra(e), subsequent encounter for fracture with routine healing: Secondary | ICD-10-CM | POA: Diagnosis not present

## 2015-06-09 DIAGNOSIS — R269 Unspecified abnormalities of gait and mobility: Secondary | ICD-10-CM | POA: Diagnosis not present

## 2015-06-09 DIAGNOSIS — S3289XA Fracture of other parts of pelvis, initial encounter for closed fracture: Secondary | ICD-10-CM | POA: Diagnosis not present

## 2015-06-09 DIAGNOSIS — S32592A Other specified fracture of left pubis, initial encounter for closed fracture: Secondary | ICD-10-CM | POA: Diagnosis not present

## 2015-06-09 LAB — COMPREHENSIVE METABOLIC PANEL
ALT: 17 U/L (ref 14–54)
ANION GAP: 10 (ref 5–15)
AST: 20 U/L (ref 15–41)
Albumin: 3.8 g/dL (ref 3.5–5.0)
Alkaline Phosphatase: 70 U/L (ref 38–126)
BUN: 21 mg/dL — AB (ref 6–20)
CALCIUM: 9 mg/dL (ref 8.9–10.3)
CO2: 26 mmol/L (ref 22–32)
CREATININE: 0.81 mg/dL (ref 0.44–1.00)
Chloride: 105 mmol/L (ref 101–111)
GFR calc non Af Amer: >60 mL/min (ref >60)
GLUCOSE: 129 mg/dL — AB (ref 65–99)
Potassium: 3.7 mmol/L (ref 3.5–5.1)
Sodium: 141 mmol/L (ref 135–145)
TOTAL PROTEIN: 7.3 g/dL (ref 6.5–8.1)
Total Bilirubin: 0.9 mg/dL (ref 0.3–1.2)

## 2015-06-09 LAB — CBC WITH DIFFERENTIAL/PLATELET
BASOS ABS: 0 10*3/uL (ref 0–0.1)
BASOS PCT: 0 %
EOS ABS: 0 10*3/uL (ref 0–0.7)
Eosinophils Relative: 0 %
HCT: 40.5 % (ref 35.0–47.0)
Hemoglobin: 13.1 g/dL (ref 12.0–16.0)
Lymphocytes Relative: 6 %
Lymphs Abs: 0.9 10*3/uL — ABNORMAL LOW (ref 1.0–3.6)
MCH: 30.3 pg (ref 26.0–34.0)
MCHC: 32.3 g/dL (ref 32.0–36.0)
MCV: 94.1 fL (ref 80.0–100.0)
MONO ABS: 1 10*3/uL — AB (ref 0.2–0.9)
MONOS PCT: 7 %
NEUTROS PCT: 87 %
Neutro Abs: 12.2 10*3/uL — ABNORMAL HIGH (ref 1.4–6.5)
Platelets: 168 10*3/uL (ref 150–440)
RBC: 4.3 MIL/uL (ref 3.80–5.20)
RDW: 14.7 % — AB (ref 11.5–14.5)
WBC: 14.1 10*3/uL — AB (ref 3.6–11.0)

## 2015-06-09 LAB — TROPONIN I

## 2015-06-09 MED ORDER — ONDANSETRON HCL 4 MG PO TABS: 4.0000 mg | ORAL_TABLET | Freq: Four times a day (QID) | ORAL | Status: DC | PRN

## 2015-06-09 MED ORDER — SODIUM CHLORIDE 0.9 % IV SOLN
INTRAVENOUS | Status: DC
  Administered 2015-06-10 – 2015-06-11 (×2): via INTRAVENOUS

## 2015-06-09 MED ORDER — OMEGA-3-ACID ETHYL ESTERS 1 G PO CAPS
1.0000 | ORAL_CAPSULE | Freq: Two times a day (BID) | ORAL | Status: DC
  Administered 2015-06-10 – 2015-06-11 (×2): 1 g via ORAL
  Filled 2015-06-09 (×3): qty 1

## 2015-06-09 MED ORDER — ROSUVASTATIN CALCIUM 5 MG PO TABS
5.0000 mg | ORAL_TABLET | Freq: Every day | ORAL | Status: DC
  Administered 2015-06-10 – 2015-06-11 (×2): 5 mg via ORAL
  Filled 2015-06-09 (×2): qty 1

## 2015-06-09 MED ORDER — MELATONIN 1 MG PO CAPS: ORAL_CAPSULE | Freq: Every day | ORAL | Status: DC

## 2015-06-09 MED ORDER — CARVEDILOL 3.125 MG PO TABS: 3.1250 mg | ORAL_TABLET | Freq: Two times a day (BID) | ORAL | Status: DC

## 2015-06-09 MED ORDER — GABAPENTIN 300 MG PO CAPS: 300.0000 mg | ORAL_CAPSULE | Freq: Three times a day (TID) | ORAL | Status: DC

## 2015-06-09 MED ORDER — FLUTICASONE PROPIONATE 50 MCG/ACT NA SUSP
1.0000 | Freq: Every day | NASAL | Status: DC
  Administered 2015-06-10 – 2015-06-11 (×2): 1 via NASAL
  Filled 2015-06-09 (×2): qty 16

## 2015-06-09 MED ORDER — OXYCODONE-ACETAMINOPHEN 5-325 MG PO TABS
2.0000 | ORAL_TABLET | Freq: Once | ORAL | Status: AC
  Administered 2015-06-09: 2 via ORAL

## 2015-06-09 MED ORDER — HYDROCODONE-ACETAMINOPHEN 5-325 MG PO TABS
1.0000 | ORAL_TABLET | ORAL | Status: DC | PRN
  Administered 2015-06-10 – 2015-06-11 (×2): 1 via ORAL
  Filled 2015-06-09 (×2): qty 1

## 2015-06-09 MED ORDER — MORPHINE SULFATE 2 MG/ML IJ SOLN: 1.0000 mg | Freq: Once | INTRAMUSCULAR | Status: DC

## 2015-06-09 MED ORDER — GABAPENTIN 300 MG PO CAPS
300.0000 mg | ORAL_CAPSULE | Freq: Three times a day (TID) | ORAL | Status: DC
  Administered 2015-06-09 – 2015-06-11 (×5): 300 mg via ORAL
  Filled 2015-06-09 (×5): qty 1

## 2015-06-09 MED ORDER — LISINOPRIL 10 MG PO TABS
10.0000 mg | ORAL_TABLET | Freq: Every day | ORAL | Status: DC
  Filled 2015-06-09: qty 1

## 2015-06-09 MED ORDER — ONDANSETRON HCL 4 MG/2ML IJ SOLN
4.0000 mg | INTRAMUSCULAR | Status: AC
Start: 2015-06-09 — End: 2015-06-09
  Administered 2015-06-09: 4 mg via INTRAVENOUS

## 2015-06-09 MED ORDER — MORPHINE SULFATE 2 MG/ML IJ SOLN
2.0000 mg | Freq: Once | INTRAMUSCULAR | Status: DC
Start: 2015-06-09 — End: 2015-06-09

## 2015-06-09 MED ORDER — DOCUSATE SODIUM 100 MG PO CAPS
100.0000 mg | ORAL_CAPSULE | Freq: Two times a day (BID) | ORAL | Status: DC
  Administered 2015-06-09 – 2015-06-11 (×4): 100 mg via ORAL
  Filled 2015-06-09 (×4): qty 1

## 2015-06-09 MED ORDER — ENOXAPARIN SODIUM 40 MG/0.4ML ~~LOC~~ SOLN
40.0000 mg | SUBCUTANEOUS | Status: DC
  Administered 2015-06-10: 40 mg via SUBCUTANEOUS
  Filled 2015-06-09: qty 0.4

## 2015-06-09 MED ORDER — AMLODIPINE BESYLATE 5 MG PO TABS
5.0000 mg | ORAL_TABLET | Freq: Every day | ORAL | Status: DC
  Filled 2015-06-09: qty 1

## 2015-06-09 MED ORDER — OXYCODONE-ACETAMINOPHEN 5-325 MG PO TABS
ORAL_TABLET | ORAL | Status: AC
  Administered 2015-06-09: 2 via ORAL
  Filled 2015-06-09: qty 2

## 2015-06-09 MED ORDER — AMLODIPINE BESYLATE 5 MG PO TABS: 5.0000 mg | ORAL_TABLET | Freq: Every day | ORAL | Status: DC

## 2015-06-09 MED ORDER — ROSUVASTATIN CALCIUM 5 MG PO TABS: 5.0000 mg | ORAL_TABLET | Freq: Every day | ORAL | Status: DC

## 2015-06-09 MED ORDER — CARVEDILOL 3.125 MG PO TABS
3.1250 mg | ORAL_TABLET | Freq: Two times a day (BID) | ORAL | Status: DC
  Administered 2015-06-10 – 2015-06-11 (×2): 3.125 mg via ORAL
  Filled 2015-06-09 (×2): qty 1

## 2015-06-09 MED ORDER — NITROGLYCERIN 0.4 MG SL SUBL: 0.4000 mg | SUBLINGUAL_TABLET | SUBLINGUAL | Status: DC | PRN

## 2015-06-09 MED ORDER — ONDANSETRON HCL 4 MG/2ML IJ SOLN: 4.0000 mg | Freq: Four times a day (QID) | INTRAMUSCULAR | Status: DC | PRN

## 2015-06-09 MED ORDER — LISINOPRIL 10 MG PO TABS: 10.0000 mg | ORAL_TABLET | Freq: Every day | ORAL | Status: DC

## 2015-06-09 MED ORDER — ISOSORBIDE MONONITRATE ER 30 MG PO TB24
30.0000 mg | ORAL_TABLET | Freq: Every day | ORAL | Status: DC
  Administered 2015-06-10 – 2015-06-11 (×2): 30 mg via ORAL
  Filled 2015-06-09 (×2): qty 1

## 2015-06-09 MED ORDER — ADULT MULTIVITAMIN W/MINERALS CH
1.0000 | ORAL_TABLET | Freq: Every day | ORAL | Status: DC
  Administered 2015-06-10 – 2015-06-11 (×2): 1 via ORAL
  Filled 2015-06-09 (×2): qty 1

## 2015-06-09 MED ORDER — ISOSORBIDE MONONITRATE ER 60 MG PO TB24: 60.0000 mg | ORAL_TABLET | Freq: Every day | ORAL | Status: DC

## 2015-06-09 MED ORDER — ONDANSETRON HCL 4 MG/2ML IJ SOLN
INTRAMUSCULAR | Status: AC
  Administered 2015-06-09: 4 mg via INTRAVENOUS
  Filled 2015-06-09: qty 2

## 2015-06-09 MED ORDER — ASPIRIN EC 81 MG PO TBEC
81.0000 mg | DELAYED_RELEASE_TABLET | Freq: Every day | ORAL | Status: DC
  Administered 2015-06-10 – 2015-06-11 (×2): 81 mg via ORAL
  Filled 2015-06-09 (×2): qty 1

## 2015-06-09 MED ORDER — ACETAMINOPHEN 650 MG RE SUPP: 650.0000 mg | Freq: Four times a day (QID) | RECTAL | Status: DC | PRN

## 2015-06-09 MED ORDER — ACETAMINOPHEN 325 MG PO TABS
650.0000 mg | ORAL_TABLET | Freq: Four times a day (QID) | ORAL | Status: DC | PRN
  Filled 2015-06-09: qty 2

## 2015-06-09 NOTE — ED Notes (Addendum)
Patient slipped in the bathtub this am and hit her head. Hematoma to left forehead. Denies any dizziness or weakness prior to fall. Denies any loss of consciousness. Patient takes a daily ASA; no other blood thinners. C/o left hip pain as well; unable to put pressure on LLE.

## 2015-06-09 NOTE — ED Notes (Signed)
Returned from MRI. Patient resting comfortably.  States pain medication has been effective and is drowsy, but easily aroused.

## 2015-06-09 NOTE — ED Notes (Signed)
Patient states she fell in the bath tub today and hit her left side of her forehead and fell on her hip.  Patient reports slipping and falling and did not have an LOC

## 2015-06-09 NOTE — ED Notes (Addendum)
Patient transported to MRI 

## 2015-06-09 NOTE — ED Notes (Addendum)
Patient easily arousable, but unable to maintain oxygen sats above 87% when patient is resting.  Once patient is awaken briefly, oxygen saturation goes back up into the mid 90s on room air.  Placed on 2L [place] to maintain sats above 90% when patient is asleep. Holding off on giving morphine until patient is more awake and alert and c/o pain. Dr. York Cerise in agreeance

## 2015-06-09 NOTE — H&P (Signed)
[facility] - [place] at [facility]   PATIENT NAME: Christina [name]    MR#:  [id number]  DATE OF BIRTH:  Aug 11, 1920  DATE OF ADMISSION:  06/09/2015  PRIMARY CARE PHYSICIAN: Tillman Abide, MD   REQUESTING/REFERRING PHYSICIAN: Jeanene Erb CHIEF COMPLAINT:   Chief Complaint  Patient presents with  . Head Injury  . Hip Injury    HISTORY OF PRESENT ILLNESS: Christina [name]  is a 81 y.o. female with a known history of coronary artery disease hypertension osteoporosis peripheral vascular disease hyperlipidemia who uses a walker and a cane intermittently. Who fell in the bathroom.  She was brought to the emergency room because of significant hip pain and the inability to ambulate. Initial x-rays of the pelvis and left hip were unremarkable. An MRI scan of the pelvis was obtained which demonstrates increased bone marrow signal in the left sacral region, as well as in the left pubic bone near the pubic symphysis, consistent with acute fractures. The MRI scan also demonstrated increased fluid in the lateral hip region, consistent with bursitis versus an acute hematoma. The patient denies any loss of consciousness and denies any other injuries. She also denies any dizziness, lightheadedness, chest pain. Patient was seen by orthopedics who recommended likely medical management and activity as tolerated with partial left leg weightbearing.    PAST MEDICAL HISTORY:   Past Medical History  Diagnosis Date  . CAD (coronary artery disease)     a. S/P previous MI;  b. 04/2007 Cath/PCI: Taxus DES' to mid/distal RCA and RPDA as well as Ramus;  c. myoview 8/09: EF 78%, normal perfusion  . Hypertension   . Osteoporosis   . Peripheral vascular disease   . Hyperlipidemia     Intolerant of many statins  . Chronic shoulder pain   . Chronic back pain   . Osteoarthritis   . Neuropathy   . Mild mitral and aortic regurgitation     a. 11/2011 Echo: EF 55-65%, No RWMA, Gr 1 DD, Mild AI/MR.  . Carotid  stenosis     Bilateral, mild to moderate    PAST SURGICAL HISTORY:  Past Surgical History  Procedure Laterality Date  . Carotid u/s  10/2006    0-39% bilaterally  . Vesicovaginal fistula closure w/ tah    . Coronary stent placement  2002    MI, [facility]  . Coronary angioplasty  12/2002    Medical rx only  . Breast biopsy      Bilateral  . Nm myoview ltd  11/2004    Stress, negative; NL EF  . Cp admit  09/2005    Stress myoview negative  . Coronary angioplasty  01/2006    LAD/PAD disease  . Adenosine myoview  01/2006    No ischemia, EF 70%  . Cataract extraction  09/2006    Right  . Coronary angioplasty  03/2007    3 vessel disease  . Coronary stent placement  04/2007    RCA/LCX multiple, [facility]  . Femoral hernia repair  05/2007    Incarcerated, right  . Fracture surgery  03/2009    Left leg    SOCIAL HISTORY:  History  Substance Use Topics  . Smoking status: Never Smoker   . Smokeless tobacco: Never Used  . Alcohol Use: No    FAMILY HISTORY:  Family History  Problem Relation Age of Onset  . Pulmonary embolism Mother   . Heart disease Father   . Heart disease Sister   . Cancer Brother   .  Heart disease Sister     DRUG ALLERGIES:  Allergies  Allergen Reactions  . Atorvastatin Other (See Comments)    Reaction:  Blisters on feet   . Diltiazem Hcl Other (See Comments)    Reaction:  Unknown   . Dye Fdc Red [Red Dye] Other (See Comments)    Reaction:  Unknown   . Gemfibrozil Other (See Comments)    Reaction:  Unknown   . Niacin Other (See Comments)    Reaction:  Unknown   . Sulfasalazine Rash    REVIEW OF SYSTEMS:   CONSTITUTIONAL: No fever, fatigue or weakness.  EYES: No blurred or double vision.  EARS, NOSE, AND THROAT: No tinnitus or ear pain.  RESPIRATORY: No cough, shortness of breath, wheezing or hemoptysis.  CARDIOVASCULAR: No chest pain, orthopnea, edema.  GASTROINTESTINAL: No nausea, vomiting, diarrhea or abdominal pain.  GENITOURINARY: No  dysuria, hematuria.  ENDOCRINE: No polyuria, nocturia,  HEMATOLOGY: No anemia, easy bruising or bleeding SKIN: No rash or bruise on the head MUSCULOSKELETAL: Complains of left hip pain NEUROLOGIC: No tingling, numbness, weakness.  PSYCHIATRY: No anxiety or depression.   MEDICATIONS AT HOME:  Prior to Admission medications   Medication Sig Start Date End Date Taking? Authorizing Provider  amLODipine (NORVASC) 5 MG tablet Take 5 mg by mouth daily.   Yes Historical Provider, MD  aspirin EC 81 MG tablet Take 81 mg by mouth daily.   Yes Historical Provider, MD  carvedilol (COREG) 3.125 MG tablet Take 3.125 mg by mouth 2 (two) times daily.   Yes Historical Provider, MD  gabapentin (NEURONTIN) 300 MG capsule Take 300 mg by mouth 3 (three) times daily.   Yes Historical Provider, MD  isosorbide mononitrate (IMDUR) 60 MG 24 hr tablet Take 60 mg by mouth daily.   Yes Historical Provider, MD  lisinopril (PRINIVIL,ZESTRIL) 10 MG tablet Take 10 mg by mouth daily.   Yes Historical Provider, MD  MEGARED OMEGA-3 KRILL OIL 500 MG CAPS Take 1 capsule by mouth 2 (two) times daily.   Yes Historical Provider, MD  MELATONIN PO Take 1 tablet by mouth at bedtime.   Yes Historical Provider, MD  mometasone (NASONEX) 50 MCG/ACT nasal spray Place 2 sprays into the nose daily as needed. Patient taking differently: Place 2 sprays into the nose daily as needed (for allergies).  08/16/11  Yes Karie Schwalbe, MD  Multiple Vitamins-Minerals (MULTIVITAMIN PO) Take 1 tablet by mouth daily.   Yes Historical Provider, MD  nitroGLYCERIN (NITROSTAT) 0.4 MG SL tablet Place 1 tablet (0.4 mg total) under the tongue every 5 (five) minutes as needed for chest pain. 05/15/14  Yes Karie Schwalbe, MD  rosuvastatin (CRESTOR) 5 MG tablet Take 5 mg by mouth daily.   Yes Historical Provider, MD      PHYSICAL EXAMINATION:   VITAL SIGNS: Blood pressure 137/69, pulse 69, temperature 97.6 F (36.4 C), temperature source Oral, resp. rate 16,  height  (1.549 m), weight 56.7 kg (125 lb), SpO2 98 %.  GENERAL:  81 y.o.-year-old patient lying in the bed with no acute distress.  EYES: Pupils equal, round, reactive to light and accommodation. No scleral icterus. Extraocular muscles intact.  HEENT: Head atraumatic, normocephalic. Oropharynx and nasopharynx clear.  NECK:  Supple, no jugular venous distention. No thyroid enlargement, no tenderness.  LUNGS: Normal breath sounds bilaterally, no wheezing, rales,rhonchi or crepitation. No use of accessory muscles of respiration.  CARDIOVASCULAR: S1, S2 normal. No murmurs, rubs, or gallops.  ABDOMEN: Soft, nontender, nondistended. Bowel  sounds present. No organomegaly or mass.  EXTREMITIES: No pedal edema, cyanosis, or clubbing.  NEUROLOGIC: Cranial nerves II through XII are intact. Muscle strength 5/5 in all extremities. Sensation intact. Gait not checked.  PSYCHIATRIC: The patient is alert and oriented x 3.  SKIN:  or ulcer. Bruise noted on forehead  LABORATORY PANEL:   CBC  Recent Labs Lab 06/09/15 1553  WBC 14.1*  HGB 13.1  HCT 40.5  PLT 168  MCV 94.1  MCH 30.3  MCHC 32.3  RDW 14.7*  LYMPHSABS 0.9*  MONOABS 1.0*  EOSABS 0.0  BASOSABS 0.0   ------------------------------------------------------------------------------------------------------------------  Chemistries   Recent Labs Lab 06/09/15 1553  NA 141  K 3.7  CL 105  CO2 26  GLUCOSE 129*  BUN 21*  CREATININE 0.81  CALCIUM 9.0  AST 20  ALT 17  ALKPHOS 70  BILITOT 0.9   ------------------------------------------------------------------------------------------------------------------ estimated creatinine clearance is 32.7 mL/min (by C-G formula based on Cr of 0.81). ------------------------------------------------------------------------------------------------------------------ No results for input(s): TSH, T4TOTAL, T3FREE, THYROIDAB in the last 72 hours.  Invalid input(s): FREET3   Coagulation  profile No results for input(s): INR, PROTIME in the last 168 hours. ------------------------------------------------------------------------------------------------------------------- No results for input(s): DDIMER in the last 72 hours. -------------------------------------------------------------------------------------------------------------------  Cardiac Enzymes  Recent Labs Lab 06/09/15 1553  TROPONINI <0.03   ------------------------------------------------------------------------------------------------------------------ Invalid input(s): POCBNP  ---------------------------------------------------------------------------------------------------------------  Urinalysis    Component Value Date/Time   COLORURINE yellow 06/05/2007 1556   APPEARANCEUR Clear 06/05/2007 1556   LABSPEC <1.005 06/05/2007 1556   PHURINE 5.0 06/05/2007 1556   HGBUR small 06/05/2007 1556   BILIRUBINUR negative 06/05/2007 1556   UROBILINOGEN 0.2 06/05/2007 1556   NITRITE negative 06/05/2007 1556     RADIOLOGY: Ct Head Wo Contrast  06/09/2015   CLINICAL DATA:  Slipped in bathtub this morning, hit head. Hematoma the left forehead.  EXAM: CT HEAD WITHOUT CONTRAST  TECHNIQUE: Contiguous axial images were obtained from the base of the skull through the vertex without intravenous contrast.  COMPARISON:  None.  FINDINGS: Left forehead soft tissue hematoma. No underlying calvarial abnormality. No acute intracranial abnormality. Specifically, no hemorrhage, hydrocephalus, mass lesion, acute infarction, or significant intracranial injury. No acute calvarial abnormality. Visualized paranasal sinuses and mastoids clear. Orbital soft tissues unremarkable.  IMPRESSION: No acute intracranial abnormality.   Electronically Signed   By: Charlett Nose M.D.   On: 06/09/2015 12:34   Mr Hip Left Wo Contrast  06/09/2015   CLINICAL DATA:  Status post fall in the bathtub this morning with a twisting injury of the left hip.  Pain with weight-bearing. Initial encounter.  EXAM: MR OF THE LEFT HIP WITHOUT CONTRAST  TECHNIQUE: Multiplanar, multisequence MR imaging was performed. No intravenous contrast was administered.  COMPARISON:  Plain films of the left hip this same day.  FINDINGS: Bones: The patient has a nondisplaced fracture of the left sacrum with associated marrow edema. Left parasymphyseal pubic bone fracture is also identified. No other fracture is seen. Specifically, there is no hip fracture. The femoral heads are located.  Articular cartilage and labrum  Articular cartilage:  Mildly degenerated.  Labrum:  Mildly degenerated.  Joint or bursal effusion  Joint effusion:  None.  Bursae: There is a small amount of fluid in the left trochanteric bursa compatible with bursitis.  Muscles and tendons  Muscles and tendons:  Intact.  Other findings  Miscellaneous: Extensive contusion and hematoma are seen in the cutaneous and subcutaneous fatty tissues about the left hip. The urinary bladder appears somewhat distended. Extensive  sigmoid diverticular disease identified without diverticulitis.  IMPRESSION: Acute left sacral and left parasymphyseal pubic bone fractures.  Negative for hip fracture.  Extensive cutaneous and subcutaneous contusion hematoma above the left greater trochanter.  Small amount fluid in the left trochanteric bursa is compatible with bursitis, possibly posttraumatic.   Electronically Signed   By: Drusilla Kanner M.D.   On: 06/09/2015 15:26   Dg Hip Unilat With Pelvis 2-3 Views Left  06/09/2015   CLINICAL DATA:  Initial encounter for fall getting out of tub today. Landed on left hip. Pain.  EXAM: LEFT HIP (WITH PELVIS) 2-3 VIEWS  COMPARISON:  None.  FINDINGS: Frontal pelvis shows diffuse bony demineralization without evidence for sacral fracture. No evidence for pubic ramus fracture.  AP and frog-leg lateral views of the right hip show no femoral neck fracture.  IMPRESSION: Negative.   Electronically Signed   By:  Kennith Center M.D.   On: 06/09/2015 12:29    EKG: Orders placed or performed during the hospital encounter of 06/09/15  . ED EKG  . ED EKG    IMPRESSION AND PLAN: Patient is a pleasant 81 year old status post fall  1.Left sacral and left pubic fractures with large left lateral hip hematoma: Seen by orthopedics recommends pain control and physical therapy and ambulation with partial weightbearing on the left lower extremity.  2. Hypertension we'll continue amlodipine ,carvedilol and lisinopril  3. Coronary artery disease continue aspirin,  Coreg and lisinopril  4. Hyperlipidemia continue Crestor as taking at home.  5. Peripheral neuropathy; continue gabapentin  6. CODE STATUS: DO NOT RESUSCITATE informed with family and patient      TOTAL TIME TAKING CARE OF THIS PATIENT: 55 minutes.    Auburn Bilberry M.D on 06/09/2015 at 5:43 PM  Between 7am to 6pm - Pager - [phone]  After 6pm go to [email or web address] - password [email or web address] [facility]  [place] [facility]  Office  [phone]  CC: Primary care physician; Tillman Abide, MD

## 2015-06-09 NOTE — Consult Note (Signed)
ORTHOPAEDIC CONSULTATION  REQUESTING PHYSICIAN: Loleta Rose, MD  Chief Complaint:   Left hip pain  History of Present Illness: Christina [name] is a 81 y.o. female who is quite independent and lives with her family. She has been complaining of increased left buttock and lower extremity pain for the past 3-4 weeks without any apparent cause or injury. Apparently, while getting out of the tub this morning, she slipped and fell onto her left side, landing on her left hip and striking her left forehead. She was brought to the emergency room because of significant hip pain and the inability to ambulate. Initial x-rays of the pelvis and left hip were unremarkable. An MRI scan of the pelvis was obtained which demonstrates increased bone marrow signal in the left sacral region, as well as in the left pubic bone near the pubic symphysis, consistent with acute fractures. The MRI scan also demonstrated increased fluid in the lateral hip region, consistent with bursitis versus an acute hematoma. The patient denies any loss of consciousness and denies any other injuries. She also denies any dizziness, lightheadedness, chest pain, or other symptoms that may have contributed to her fall this morning.  Past Medical History  Diagnosis Date  . CAD (coronary artery disease)     a. S/P previous MI;  b. 04/2007 Cath/PCI: Taxus DES' to mid/distal RCA and RPDA as well as Ramus;  c. myoview 8/09: EF 78%, normal perfusion  . Hypertension   . Osteoporosis   . Peripheral vascular disease   . Hyperlipidemia     Intolerant of many statins  . Chronic shoulder pain   . Chronic back pain   . Osteoarthritis   . Neuropathy   . Mild mitral and aortic regurgitation     a. 11/2011 Echo: EF 55-65%, No RWMA, Gr 1 DD, Mild AI/MR.  . Carotid stenosis     Bilateral, mild to moderate   Past Surgical History  Procedure Laterality Date  . Carotid u/s  10/2006    0-39%  bilaterally  . Vesicovaginal fistula closure w/ tah    . Coronary stent placement  2002    MI, [facility]  . Coronary angioplasty  12/2002    Medical rx only  . Breast biopsy      Bilateral  . Nm myoview ltd  11/2004    Stress, negative; NL EF  . Cp admit  09/2005    Stress myoview negative  . Coronary angioplasty  01/2006    LAD/PAD disease  . Adenosine myoview  01/2006    No ischemia, EF 70%  . Cataract extraction  09/2006    Right  . Coronary angioplasty  03/2007    3 vessel disease  . Coronary stent placement  04/2007    RCA/LCX multiple, [facility]  . Femoral hernia repair  05/2007    Incarcerated, right  . Fracture surgery  03/2009    Left leg   History   Social History  . Marital Status: Widowed    Spouse Name: N/A  . Number of Children: 3  . Years of Education: N/A   Social History Main Topics  . Smoking status: Never Smoker   . Smokeless tobacco: Never Used  . Alcohol Use: No  . Drug Use: No  . Sexual Activity: Not on file   Other Topics Concern  . 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 Pulmonary embolism Mother   . Heart disease Father   . Heart disease Sister   . Cancer Brother   . Heart disease Sister    Allergies  Allergen Reactions  . Atorvastatin     REACTION: blisters on feet  . Diltiazem Hcl   . Dye Fdc Red [Red Dye]   . Gemfibrozil   . Niacin   . Sulfasalazine Rash   Prior to Admission medications   Medication Sig Start Date End Date Taking? Authorizing Provider  amLODipine (NORVASC) 5 MG tablet Take 1 tablet by mouth  daily 04/19/15   Karie Schwalbe, MD  aspirin 81 MG EC tablet Take 81 mg by mouth daily.      Historical Provider, MD  carvedilol (COREG)  3.125 MG tablet Take 1 tablet by mouth two  times daily 04/19/15   Karie Schwalbe, MD  CRESTOR 5 MG tablet Take 1 tablet by mouth  daily 04/19/15   Karie Schwalbe, MD  Fish Oil OIL Take 2 capsules by mouth daily.      Historical Provider, MD  gabapentin (NEURONTIN) 300 MG capsule Take 1 capsule by mouth 3  times daily 04/19/15   Karie Schwalbe, MD  isosorbide mononitrate (IMDUR) 60 MG 24 hr tablet Take 1 tablet by mouth  daily 04/19/15   Karie Schwalbe, MD  lisinopril (PRINIVIL,ZESTRIL) 10 MG tablet Take 1 tablet by mouth  daily 04/19/15   Karie Schwalbe, MD  Melatonin 1 MG TABS Take by mouth as needed.    Historical Provider, MD  mometasone (NASONEX) 50 MCG/ACT nasal spray Place 2 sprays into the nose daily as needed. 08/16/11   Karie Schwalbe, MD  Multiple Vitamin (MULTIVITAMIN) capsule Take 1 capsule by mouth daily.      Historical Provider, MD  nitroGLYCERIN (NITROSTAT) 0.4 MG SL tablet Place 1 tablet (0.4 mg total) under the tongue every 5 (five) minutes as needed for chest pain. 05/15/14   Karie Schwalbe, MD   Ct Head Wo Contrast  06/09/2015   CLINICAL DATA:  Slipped in bathtub this morning, hit head. Hematoma the left forehead.  EXAM: CT HEAD WITHOUT CONTRAST  TECHNIQUE: Contiguous axial images were obtained from the base of the skull through the vertex without intravenous contrast.  COMPARISON:  None.  FINDINGS: Left forehead soft tissue hematoma. No underlying calvarial abnormality. No acute intracranial abnormality. Specifically, no hemorrhage, hydrocephalus, mass lesion, acute infarction, or significant intracranial injury. No acute calvarial abnormality. Visualized paranasal sinuses and mastoids clear. Orbital soft tissues unremarkable.  IMPRESSION: No acute intracranial abnormality.   Electronically Signed   By: Charlett Nose M.D.   On: 06/09/2015 12:34   Mr Hip Left Wo Contrast  06/09/2015   CLINICAL DATA:  Status post fall in the bathtub this morning with a twisting injury of the left  hip. Pain with weight-bearing. Initial encounter.  EXAM: MR OF THE LEFT HIP WITHOUT CONTRAST  TECHNIQUE: Multiplanar, multisequence MR imaging was performed. No intravenous contrast was administered.  COMPARISON:  Plain films of the left hip this same day.  FINDINGS: Bones: The patient has a nondisplaced fracture of the left sacrum with associated marrow edema. Left parasymphyseal pubic bone fracture is also identified. No other fracture is seen. Specifically, there is no  hip fracture. The femoral heads are located.  Articular cartilage and labrum  Articular cartilage:  Mildly degenerated.  Labrum:  Mildly degenerated.  Joint or bursal effusion  Joint effusion:  None.  Bursae: There is a small amount of fluid in the left trochanteric bursa compatible with bursitis.  Muscles and tendons  Muscles and tendons:  Intact.  Other findings  Miscellaneous: Extensive contusion and hematoma are seen in the cutaneous and subcutaneous fatty tissues about the left hip. The urinary bladder appears somewhat distended. Extensive sigmoid diverticular disease identified without diverticulitis.  IMPRESSION: Acute left sacral and left parasymphyseal pubic bone fractures.  Negative for hip fracture.  Extensive cutaneous and subcutaneous contusion hematoma above the left greater trochanter.  Small amount fluid in the left trochanteric bursa is compatible with bursitis, possibly posttraumatic.   Electronically Signed   By: Drusilla Kanner M.D.   On: 06/09/2015 15:26   Dg Hip Unilat With Pelvis 2-3 Views Left  06/09/2015   CLINICAL DATA:  Initial encounter for fall getting out of tub today. Landed on left hip. Pain.  EXAM: LEFT HIP (WITH PELVIS) 2-3 VIEWS  COMPARISON:  None.  FINDINGS: Frontal pelvis shows diffuse bony demineralization without evidence for sacral fracture. No evidence for pubic ramus fracture.  AP and frog-leg lateral views of the right hip show no femoral neck fracture.  IMPRESSION: Negative.   Electronically Signed    By: Kennith Center M.D.   On: 06/09/2015 12:29    Positive ROS: All other systems have been reviewed and were otherwise negative with the exception of those mentioned in the HPI and as above.  Physical Exam: General:  Alert, no acute distress Psychiatric:  Patient is competent for consent with normal mood and affect   Cardiovascular:  No pedal edema Respiratory:  No wheezing, non-labored breathing GI:  Abdomen is soft and non-tender Skin:  No lesions in the area of chief complaint Neurologic:  Sensation intact distally Lymphatic:  No axillary or cervical lymphadenopathy  Orthopedic Exam:  Orthopedic examination is limited to the left hip and lower extremity. There is moderate swelling over the lateral aspect of the hip with tenderness to palpation. The skin itself shows no evidence for erythema, ecchymosis, lacerations, or other skin abnormalities. She is able to tolerate hip flexion to 90, internal rotation to 10, and external rotation to 35 with mild discomfort in the lateral hip at the extremes of flexion, internal rotation, and external rotation. She is neurovascularly intact to the left lower extremity with 4+/5 strength with resisted ankle dorsiflexion, resisted ankle plantar flexion, resisted ankle inversion, and resisted ankle eversion. Sensation is slightly diminished subjectively to all distributions, consistent with her history of peripheral neuropathy. She has good capillary refill to her foot.  X-rays:  Recent x-rays of the pelvis and left hip are available for review. The findings are as described above.  A recent MRI scan of the pelvis and left hip also is available for review. These findings also are as described above.  Assessment: Left sacral and left pubic fractures with large left lateral hip hematoma.  Plan: The treatment options are reviewed with the patient. Apparently, the patient has significant pain with any attempted standing or ambulation. Given the MRI  findings, I do not feel that it would be safe to send the patient home this evening. Rather, I feel that she should be admitted to the hospitalist service for further workup and distance with pain management. I have explained to the patient and her family  that these are nonsurgical injuries, which greatly relieves the patient. However, they understand that this may take several months for her to completely recover. She is to receive appropriate pain medication. She may start with physical therapy and be mobilized as symptoms permit. Initially, she should be started with a walker partial weightbearing on the left lower extremity.  Thank you for ask me to participate in the care of this most pleasant woman. I will be happy to follow her with you.    Maryagnes Amos, MD  Beeper #:  [phone]  06/09/2015 5:14 PM

## 2015-06-09 NOTE — ED Provider Notes (Signed)
[facility] Emergency Department Provider Note  ____________________________________________  Time seen: Approximately 2:10 PM  I have reviewed the triage vital signs and the nursing notes.   HISTORY  Chief Complaint Head Injury and Hip Injury    HPI DIANELY KREHBIEL is a 81 y.o. female with a history that includes coronary artery disease with several cardiac stents, hypertension,osteoporosis, and peripheral vascular disease presents with moderate to severe left hip and groin pain after falling in the bathtub.  She was getting out of the shower when she had a mechanical fall and slipped, striking her left forehead and injuring her left hip.  She is not able to bear weight and is in mild to moderate pain while resting still but in severe pain when trying to walk or move the hip.  She did not lose consciousness at the time of the fall.  She reports a mild headache at this time, no visual changes, no vomiting or nausea, no chest pain, no shortness of breath, no abdominal pain.  The pain in her left hip is severe with any motion or weightbearing and is described as sharp and stabbing.  Rest makes it somewhat better.  She has no pain distally in the extremity.  She denies any other injuries from her fall  Her PCP is Dr. Alphonsus Sias and her cardiologist is Dr. Kirke Corin.   Past Medical History  Diagnosis Date  . CAD (coronary artery disease)     a. S/P previous MI;  b. 04/2007 Cath/PCI: Taxus DES' to mid/distal RCA and RPDA as well as Ramus;  c. myoview 8/09: EF 78%, normal perfusion  . Hypertension   . Osteoporosis   . Peripheral vascular disease   . Hyperlipidemia     Intolerant of many statins  . Chronic shoulder pain   . Chronic back pain   . Osteoarthritis   . Neuropathy   . Mild mitral and aortic regurgitation     a. 11/2011 Echo: EF 55-65%, No RWMA, Gr 1 DD, Mild AI/MR.  . Carotid stenosis     Bilateral, mild to moderate    Patient Active Problem List   Diagnosis  Date Noted  . Osteoarthritis, multiple sites 03/04/2015  . Advanced directives, counseling/discussion 09/04/2014  . Mild cognitive impairment 09/04/2014  . Routine general medical examination at a health care facility 08/26/2013  . Carotid stenosis   . Situational stress 02/12/2012  . Hyperlipidemia 12/04/2007  . Essential hypertension 06/04/2007  . Coronary artery disease with unspecified angina pectoris 06/04/2007  . PERIPHERAL VASCULAR DISEASE 06/04/2007  . OSTEOPOROSIS 06/04/2007    Past Surgical History  Procedure Laterality Date  . Carotid u/s  10/2006    0-39% bilaterally  . Vesicovaginal fistula closure w/ tah    . Coronary stent placement  2002    MI, [facility]  . Coronary angioplasty  12/2002    Medical rx only  . Breast biopsy      Bilateral  . Nm myoview ltd  11/2004    Stress, negative; NL EF  . Cp admit  09/2005    Stress myoview negative  . Coronary angioplasty  01/2006    LAD/PAD disease  . Adenosine myoview  01/2006    No ischemia, EF 70%  . Cataract extraction  09/2006    Right  . Coronary angioplasty  03/2007    3 vessel disease  . Coronary stent placement  04/2007    RCA/LCX multiple, [facility]  . Femoral hernia repair  05/2007    Incarcerated,  right  . Fracture surgery  03/2009    Left leg    Current Outpatient Rx  Name  Route  Sig  Dispense  Refill  . amLODipine (NORVASC) 5 MG tablet   Oral   Take 5 mg by mouth daily.         . carvedilol (COREG) 3.125 MG tablet   Oral   Take 3.125 mg by mouth 2 (two) times daily.         . isosorbide mononitrate (IMDUR) 60 MG 24 hr tablet   Oral   Take 60 mg by mouth daily.         Marland Kitchen MEGARED OMEGA-3 KRILL OIL 500 MG CAPS   Oral   Take 1 capsule by mouth 2 (two) times daily.         Marland Kitchen aspirin 81 MG EC tablet   Oral   Take 81 mg by mouth daily.           . CRESTOR 5 MG tablet      Take 1 tablet by mouth  daily   90 tablet   3   . Fish Oil OIL   Oral   Take 2 capsules by mouth daily.            Marland Kitchen gabapentin (NEURONTIN) 300 MG capsule      Take 1 capsule by mouth 3  times daily   270 capsule   3   . lisinopril (PRINIVIL,ZESTRIL) 10 MG tablet      Take 1 tablet by mouth  daily   90 tablet   3   . Melatonin 1 MG TABS   Oral   Take by mouth as needed.         . mometasone (NASONEX) 50 MCG/ACT nasal spray   Nasal   Place 2 sprays into the nose daily as needed.   17 g   11   . Multiple Vitamin (MULTIVITAMIN) capsule   Oral   Take 1 capsule by mouth daily.           . nitroGLYCERIN (NITROSTAT) 0.4 MG SL tablet   Sublingual   Place 1 tablet (0.4 mg total) under the tongue every 5 (five) minutes as needed for chest pain.   25 tablet   3     Allergies Atorvastatin; Diltiazem hcl; Dye fdc red; Gemfibrozil; Niacin; and Sulfasalazine  Family History  Problem Relation Age of Onset  . Pulmonary embolism Mother   . Heart disease Father   . Heart disease Sister   . Cancer Brother   . Heart disease Sister     Social History History  Substance Use Topics  . Smoking status: Never Smoker   . Smokeless tobacco: Never Used  . Alcohol Use: No    Review of Systems Constitutional: No fever/chills.  Mild headache Eyes: No visual changes. ENT: No sore throat. Cardiovascular: Denies chest pain. Respiratory: Denies shortness of breath. Gastrointestinal: No abdominal pain.  No nausea, no vomiting.  No diarrhea.  No constipation. Genitourinary: Negative for dysuria. Musculoskeletal: Negative for back pain.  Severe pain in her left hip with motion and weightbearing. Skin: Negative for rash. Neurological: Negative for headaches, focal weakness or numbness.  10-point ROS otherwise negative.  ____________________________________________   PHYSICAL EXAM:  VITAL SIGNS: ED Triage Vitals  Enc Vitals Group     BP 06/09/15 1129 170/55 mmHg     Pulse Rate 06/09/15 1129 61     Resp 06/09/15 1349 18     Temp  06/09/15 1129 97.6 F (36.4 C)     Temp Source  06/09/15 1129 Oral     SpO2 06/09/15 1129 93 %     Weight 06/09/15 1129 125 lb (56.7 kg)     Height 06/09/15 1129 5\' 1"  (1.549 m)     Head Cir --      Peak Flow --      Pain Score 06/09/15 1129 5     Pain Loc --      Pain Edu? --      Excl. in GC? --     Constitutional: Alert and oriented. Well appearing for age.  Normal distress due to pain in her leg Eyes: Conjunctivae are normal. PERRL. EOMI. Head: Large hematoma on left forehead, otherwise no deformities Nose: No congestion/rhinnorhea. Mouth/Throat: Mucous membranes are moist.  Oropharynx non-erythematous. Neck: No stridor.  No cervical spine tenderness to palpation and no pain with range of motion. Cardiovascular: Normal rate, regular rhythm. Grossly normal heart sounds.  Good peripheral circulation. Respiratory: Normal respiratory effort.  No retractions. Lungs CTAB. Gastrointestinal: Soft and nontender. No distention. No abdominal bruits. No CVA tenderness. Musculoskeletal: No lower edema nor joint effusions.  No external rotation or shortening of the limb.  Neurovascularly intact distally.  Pain with active flexor/extension of the left hip and with left hip external rotation and internal rotation. Neurologic:  Normal speech and language. No gross focal neurologic deficits are appreciated. Speech is normal. Skin:  Skin is warm, dry and intact. No rash noted.   ____________________________________________   LABS (all labs ordered are listed, but only abnormal results are displayed)  Labs Reviewed  COMPREHENSIVE METABOLIC PANEL - Abnormal; Notable for the following:    Glucose, Bld 129 (*)    BUN 21 (*)    All other components within normal limits  CBC WITH DIFFERENTIAL/PLATELET - Abnormal; Notable for the following:    WBC 14.1 (*)    RDW 14.7 (*)    Neutro Abs 12.2 (*)    Lymphs Abs 0.9 (*)    Monocytes Absolute 1.0 (*)    All other components within normal limits  TROPONIN I  URINALYSIS COMPLETEWITH MICROSCOPIC  ([facility] ONLY)   ____________________________________________  EKG  ED ECG REPORT I, Dresden Lozito, the attending physician, personally viewed and interpreted this ECG.  Date: 06/09/2015 EKG Time: 15:53 Rate: 76 Rhythm: normal sinus rhythm QRS Axis: normal Intervals: normal, LVH ST/T Wave abnormalities: normal Conduction Disutrbances: none Narrative Interpretation: unremarkable  ____________________________________________  RADIOLOGY  I, Gregory Dowe, personally viewed and evaluated these images as part of my medical decision making (CT head and plain films of hip)  Ct Head Wo Contrast  06/09/2015   CLINICAL DATA:  Slipped in bathtub this morning, hit head. Hematoma the left forehead.  EXAM: CT HEAD WITHOUT CONTRAST  TECHNIQUE: Contiguous axial images were obtained from the base of the skull through the vertex without intravenous contrast.  COMPARISON:  None.  FINDINGS: Left forehead soft tissue hematoma. No underlying calvarial abnormality. No acute intracranial abnormality. Specifically, no hemorrhage, hydrocephalus, mass lesion, acute infarction, or significant intracranial injury. No acute calvarial abnormality. Visualized paranasal sinuses and mastoids clear. Orbital soft tissues unremarkable.  IMPRESSION: No acute intracranial abnormality.   Electronically Signed   By: Charlett Nose M.D.   On: 06/09/2015 12:34   Mr Hip Left Wo Contrast  06/09/2015   CLINICAL DATA:  Status post fall in the bathtub this morning with a twisting injury of the left hip. Pain with weight-bearing. Initial encounter.  EXAM: MR OF THE LEFT HIP WITHOUT CONTRAST  TECHNIQUE: Multiplanar, multisequence MR imaging was performed. No intravenous contrast was administered.  COMPARISON:  Plain films of the left hip this same day.  FINDINGS: Bones: The patient has a nondisplaced fracture of the left sacrum with associated marrow edema. Left parasymphyseal pubic bone fracture is also identified. No other fracture is  seen. Specifically, there is no hip fracture. The femoral heads are located.  Articular cartilage and labrum  Articular cartilage:  Mildly degenerated.  Labrum:  Mildly degenerated.  Joint or bursal effusion  Joint effusion:  None.  Bursae: There is a small amount of fluid in the left trochanteric bursa compatible with bursitis.  Muscles and tendons  Muscles and tendons:  Intact.  Other findings  Miscellaneous: Extensive contusion and hematoma are seen in the cutaneous and subcutaneous fatty tissues about the left hip. The urinary bladder appears somewhat distended. Extensive sigmoid diverticular disease identified without diverticulitis.  IMPRESSION: Acute left sacral and left parasymphyseal pubic bone fractures.  Negative for hip fracture.  Extensive cutaneous and subcutaneous contusion hematoma above the left greater trochanter.  Small amount fluid in the left trochanteric bursa is compatible with bursitis, possibly posttraumatic.   Electronically Signed   By: Drusilla Kanner M.D.   On: 06/09/2015 15:26   Dg Hip Unilat With Pelvis 2-3 Views Left  06/09/2015   CLINICAL DATA:  Initial encounter for fall getting out of tub today. Landed on left hip. Pain.  EXAM: LEFT HIP (WITH PELVIS) 2-3 VIEWS  COMPARISON:  None.  FINDINGS: Frontal pelvis shows diffuse bony demineralization without evidence for sacral fracture. No evidence for pubic ramus fracture.  AP and frog-leg lateral views of the right hip show no femoral neck fracture.  IMPRESSION: Negative.   Electronically Signed   By: Kennith Center M.D.   On: 06/09/2015 12:29    ____________________________________________   PROCEDURES  Procedure(s) performed: None  Critical Care performed: No ____________________________________________   INITIAL IMPRESSION / ASSESSMENT AND PLAN / ED COURSE  Pertinent labs & imaging results that were available during my care of the patient were reviewed by me and considered in my medical decision making (see chart for  details).  The patient's nurse and I attempted to help her ambulate after I discussed the negative radiographs with her and her family.  However, even with heavy assistance with both of [place], the patient was unable to bear weight on her left leg.  I am concerned for an occult fracture of her pelvis/hip.  I discussed with the MR radiologist in [place] and we agreed that a noncontrast MR of her left hip would have a high sensitivity for occult fracture.  I am treating the patient with by mouth analgesia at this time and the family and the patient have been updated.    ----------------------------------------- 5:10 PM on 06/09/2015 -----------------------------------------  The patient has several occult pelvic fractures as well as trauma to the greater trochanter.  I consult to Dr. Joice Lofts with orthopedic surgery who came to the emergency department and evaluated the patient in person.  We both agree that she needs admission to the hospital for pain management and PT evaluation.  I will consult the hospitalist for admission.  I discussed the situation with her family and they understand and agree.  I initially treated her with 2 Percocet which has made her somnolent and required the use of 2 L nasal cannula O2.  However she awakens easily to touch and voice.  She continues  to have severe pain and tenderness with movement of the hip but is in no pain at rest.  Achieving an appropriate balance of analgesia will be difficult and required titration in the hospital. ____________________________________________  FINAL CLINICAL IMPRESSION(S) / ED DIAGNOSES  Final diagnoses:  Inability to bear weight  Acute hip pain, left  Multiple pelvic fractures, closed, initial encounter      NEW MEDICATIONS STARTED DURING THIS VISIT:  New Prescriptions   No medications on file     Loleta Rose, MD 06/09/15 1718

## 2015-06-09 NOTE — ED Notes (Signed)
Attempted to walk patient in room, unable to bear any weight on left leg patient c/o extreme pain.  Patient is not ambulatory at this time without assistance.

## 2015-06-10 ENCOUNTER — Encounter
Admission: RE | Admit: 2015-06-10 | Discharge: 2015-06-10 | Disposition: A | Discharge status: Home / Self Care | Payer: Medicare Other | Source: Ambulatory Visit | Attending: Internal Medicine | Admitting: Internal Medicine

## 2015-06-10 LAB — BASIC METABOLIC PANEL
Anion gap: 6 (ref 5–15)
BUN: 16 mg/dL (ref 6–20)
CO2: 27 mmol/L (ref 22–32)
Calcium: 8.6 mg/dL — ABNORMAL LOW (ref 8.9–10.3)
Chloride: 106 mmol/L (ref 101–111)
Creatinine, Ser: 0.76 mg/dL (ref 0.44–1.00)
GFR calc Af Amer: >60 mL/min (ref >60)
GFR calc non Af Amer: >60 mL/min (ref >60)
GLUCOSE: 93 mg/dL (ref 65–99)
POTASSIUM: 3.7 mmol/L (ref 3.5–5.1)
SODIUM: 139 mmol/L (ref 135–145)

## 2015-06-10 LAB — URINALYSIS COMPLETE WITH MICROSCOPIC (ARMC ONLY)
Bilirubin Urine: NEGATIVE
Glucose, UA: NEGATIVE mg/dL
HGB URINE DIPSTICK: NEGATIVE
KETONES UR: NEGATIVE mg/dL
Nitrite: NEGATIVE
PH: 6 (ref 5.0–8.0)
Protein, ur: NEGATIVE mg/dL
SPECIFIC GRAVITY, URINE: 1.01 (ref 1.005–1.030)

## 2015-06-10 LAB — CBC
HCT: 37.3 % (ref 35.0–47.0)
HEMOGLOBIN: 12.4 g/dL (ref 12.0–16.0)
MCH: 31 pg (ref 26.0–34.0)
MCHC: 33.2 g/dL (ref 32.0–36.0)
MCV: 93.3 fL (ref 80.0–100.0)
PLATELETS: 149 10*3/uL — AB (ref 150–440)
RBC: 4 MIL/uL (ref 3.80–5.20)
RDW: 14.5 % (ref 11.5–14.5)
WBC: 9 10*3/uL (ref 3.6–11.0)

## 2015-06-10 NOTE — Progress Notes (Signed)
Clinical Child psychotherapist (CSW) presented bed offers to patient. She chose [facility]	[place]. CSW contacted patient's son [name] and made him aware of above. Patient and son are in agreement with plan for patient to D/C to [facility] tomorrow pending medical clearance. CSW will continue to follow and assist as needed.   Jetta Lout, LCSWA [phone]

## 2015-06-10 NOTE — Progress Notes (Signed)
Subjective: Patient notes that her pain is well-controlled with her present pain medication regimen. Physical therapy came by to perform some bed exercises but did not get her out of bed.   Objective: Vital signs in last 24 hours: Temp:  [97.5 F (36.4 C)-99 F (37.2 C)] 98.3 F (36.8 C) (06/23 0743) Pulse Rate:  [65-77] 69 (06/23 1020) Resp:  [16-18] 18 (06/23 0743) BP: (109-178)/(43-74) 109/43 mmHg (06/23 0922) SpO2:  [87 %-98 %] 94 % (06/23 1020) Weight:  [54.976 kg (121 lb 3.2 oz)-56.7 kg (125 lb)] 54.976 kg (121 lb 3.2 oz) (06/22 1827)  Intake/Output from previous day: 06/22 0701 - 06/23 0700 In: -  Out: 200 [Urine:200] Intake/Output this shift: Total I/O In: 240 [P.O.:240] Out: 400 [Urine:400]   Recent Labs  06/09/15 1553 06/10/15 0457  HGB 13.1 12.4    Recent Labs  06/09/15 1553 06/10/15 0457  WBC 14.1* 9.0  RBC 4.30 4.00  HCT 40.5 37.3  PLT 168 149*    Recent Labs  06/09/15 1553 06/10/15 0457  NA 141 139  K 3.7 3.7  CL 105 106  CO2 26 27  BUN 21* 16  CREATININE 0.81 0.76  GLUCOSE 129* 93  CALCIUM 9.0 8.6*   No results for input(s): LABPT, INR in the last 72 hours.  Physical Exam: Neurovascular intact to left foot and lower leg.   X-rays: None  Assessment: Left sacral and pubic bone fractures  Plan: Continue present pain medication regimen. Continue to progress physical therapy as symptoms permit. Please arrange for her to follow-up in my office in 1 month for repeat x-rays of the pelvis. Thank you!   Excell Seltzer Chelsei Mcchesney 06/10/2015, 2:19 PM

## 2015-06-10 NOTE — Progress Notes (Signed)
BP low 109/43 MD said to not give norvasc or lisinopril at this time

## 2015-06-10 NOTE — Progress Notes (Signed)
Pts BP 110/50. Dr. Clent Ridges notified. Per MD hold medication. Will continue to monitor.

## 2015-06-10 NOTE — Evaluation (Signed)
Physical Therapy Evaluation Patient Details Name: Christina [name] MRN: [id number] DOB: Jul 18, 1920 Today's Date: 06/10/2015   History of Present Illness  presented to ER status post fall in bathroom with acute onset of L hip pain, inability to ambulate; admitted with L sacral, parasymphyseal pubic bone fracture (confirmed by MRI) and extensive hematoma over L greater trochanter.  PWB L LE.  Clinical Impression  Upon evaluation, patient alert and oriented, follows all commands.  Rates pain as minimal at rest, but 8-10/10 with WBing efforts.  L LE act assist ROM grossly WFL for basic transfers and mobility, strength at least 3-/5 (limited by pain).  Currently requiring mod/max assist for bed mobility; min assist with RW for sit/stand. Unable to tolerate WBing L LE necessary for additional gait/mobility efforts, requiring return to bed for pain control end of session. Did trial session on RA, but noted desat to 87% with exertion; returned to 2L (94%) end of session. Would benefit from skilled PT to address above deficits and promote optimal return to PLOF; recommend transition to STR upon discharge from acute hospitalization. Patient/family aware of recommendations and in agreement with plan.      Follow Up Recommendations SNF    Equipment Recommendations  Rolling walker with 5" wheels    Recommendations for Other Services       Precautions / Restrictions Precautions Precautions: Fall Restrictions Weight Bearing Restrictions: Yes LLE Weight Bearing: Partial weight bearing      Mobility  Bed Mobility Overal bed mobility: Needs Assistance Bed Mobility: Supine to Sit     Supine to sit: Mod assist;Max assist     General bed mobility comments: assist for LE management, truncal elevation; maintains excessive weight shift to R pelvis  Transfers Overall transfer level: Needs assistance Equipment used: Rolling walker (2 wheeled) Transfers: Sit to/from Stand Sit to Stand: Min assist          General transfer comment: slow and guarded, cuing for hand placement.    Ambulation/Gait             General Gait Details: Unable to tolerate WBing for stepping/limb advancement  Stairs            Wheelchair Mobility    Modified Rankin (Stroke Patients Only)       Balance Overall balance assessment: Needs assistance Sitting-balance support: No upper extremity supported;Feet supported Sitting balance-Leahy Scale: Fair     Standing balance support: Bilateral upper extremity supported;No upper extremity supported Standing balance-Leahy Scale: Poor                               Pertinent Vitals/Pain Pain Assessment: Faces Faces Pain Scale: Hurts a little bit Pain Location: 2/10 at rest, 8-10/10 with WBing efforts Pain Descriptors / Indicators: Aching;Sore Pain Intervention(s): Limited activity within patient's tolerance;Monitored during session;Repositioned    Home Living Family/patient expects to be discharged to:: Private residence Living Arrangements: Children (Son, able to provide 24 hour sup/assist as needed) Available Help at Discharge: Family Type of Home: House Home Access: Stairs to enter   [employer] of Steps: 3 steps Home Layout: One level        Prior Function Level of Independence: Independent         Comments: Indep with household/community mobility without assist device; enjoys working outside in [employer] garden.  Endorses 2-3 falls in previous six months     Hand Dominance        Extremity/Trunk Assessment  Upper Extremity Assessment: Overall WFL for tasks assessed           Lower Extremity Assessment: LLE deficits/detail   LLE Deficits / Details: Act assist of L hip grossly WFL (tolerating at least 90 degrees hip flexion) for transfers/mobility; strength at least 3-/5 (limited by pain)     Communication   Communication: No difficulties  Cognition Arousal/Alertness: Awake/alert Behavior  During Therapy: WFL for tasks assessed/performed Overall Cognitive Status: Within Functional Limits for tasks assessed                      General Comments      Exercises Other Exercises Other Exercises: Bilat LE supine therex, 1x10, AROM for muscular strength/endurance with functional activities: ankle pumps, SAQs, heel slides, hip abduct/adduct.  Act assist to L LE for pain control. (10 minutes) Other Exercises: Sit/stand x3 with RW, min/mod assist--increased pain with repetition; unable to tolerate stepping/limb advancement.      Assessment/Plan    PT Assessment Patient needs continued PT services  PT Diagnosis Difficulty walking;Generalized weakness;Acute pain   PT Problem List Decreased strength;Decreased range of motion;Decreased activity tolerance;Decreased balance;Decreased mobility;Decreased cognition;Decreased knowledge of use of DME;Decreased safety awareness;Decreased knowledge of precautions;Pain  PT Treatment Interventions DME instruction;Gait training;Stair training;Functional mobility training;Therapeutic activities;Therapeutic exercise;Balance training;Patient/family education   PT Goals (Current goals can be found in the Care Plan section) Acute Rehab PT Goals Patient Stated Goal: "I want to go home" PT Goal Formulation: With patient Time For Goal Achievement: 06/24/15 Potential to Achieve Goals: Good    Frequency 7X/week   Barriers to discharge        Co-evaluation               End of Session Equipment Utilized During Treatment: Gait belt Activity Tolerance: Patient limited by pain Patient left: in bed;with call bell/phone within reach;with bed alarm set;with family/visitor present           Time: 0922-0952 PT Time Calculation (min) (ACUTE ONLY): 30 min   Charges:   PT Evaluation $Initial PT Evaluation Tier I: 1 Procedure PT Treatments $Therapeutic Exercise: 8-22 mins   PT G Codes:        Jarious Lyon H. Manson Passey, PT, DPT,  NCS 06/10/2015, 10:31 AM [phone]

## 2015-06-10 NOTE — Clinical Social Work Placement (Signed)
   CLINICAL SOCIAL WORK PLACEMENT  NOTE  Date:  06/10/2015  Patient Details  Name: Christina [name] MRN: [id number] Date of Birth: 07/11/1920  Clinical Social Work is seeking post-discharge placement for this patient at the Skilled  Nursing Facility level of care (*CSW will initial, date and re-position this form in  chart as items are completed):  Yes   Patient/family provided with [place] Clinical Social Work Department's list of facilities offering this level of care within the geographic area requested by the patient (or if unable, by the patient's family).  Yes   Patient/family informed of their freedom to choose among providers that offer the needed level of care, that participate in Medicare, Medicaid or managed care program needed by the patient, have an available bed and are willing to accept the patient.  Yes   Patient/family informed of [facility]'s ownership interest in [facility] and [facility], as well as of the fact that they are under no obligation to receive care at these facilities.  PASRR submitted to EDS on 06/10/15     PASRR number received on 06/10/15     Existing PASRR number confirmed on       FL2 transmitted to all facilities in geographic area requested by pt/family on 06/10/15     FL2 transmitted to all facilities within larger geographic area on       Patient informed that his/her managed care company has contracts with or will negotiate with certain facilities, including the following:            Patient/family informed of bed offers received.  Patient chooses bed at       Physician recommends and patient chooses bed at      Patient to be transferred to   on  .  Patient to be transferred to facility by       Patient family notified on   of transfer.  Name of family member notified:        PHYSICIAN       Additional Comment:    _______________________________________________ Dede Query, LCSW 06/10/2015, 11:37 AM

## 2015-06-10 NOTE — Clinical Social Work Note (Signed)
Clinical Social Work Assessment  Patient Details  Name: Christina [name] MRN: [id number] Date of Birth: 08/26/1920  Date of referral:  06/10/15               Reason for consult:  Facility Placement                Permission sought to share information with:  Family Supports Permission granted to share information::  Yes, Verbal Permission Granted  Name::      Christina [name], granddaughter)   Housing/Transportation Living arrangements for the past 2 months:  Single Family Home Source of Information:  Patient Patient Interpreter Needed:  None Criminal Activity/Legal Involvement Pertinent to Current Situation/Hospitalization:  No - Comment as needed Significant Relationships:  Adult Children, Other Family Members Lives with:    Do you feel safe going back to the place where you live?    Need for family participation in patient care:  No (Coment)  Care giving concerns:  None reported by pt or granddaughter.    Social Worker assessment / plan:  CSW met with pt and granddaughter to address consult. CSW introduced herself and explained role of social work. CSW also explained the process of discharging to SNF.  Pt lives with her son. Pt was admitted with a pelvic fracture after a fall. PT is recommending SNF. Pt and granddaughter are agreeable to placement. CSW completed FL2 and placed it on the chart for MD's signature. CSW will follow up with bed offers. CSW will continue to follow.   Employment status:  Retired Nurse, adult PT Recommendations:  [place] / Referral to community resources:  [place]  Patient/Family's Response to care:  Pt and family were very pleasant and agreeable to discharge plan.   Patient/Family's Understanding of and Emotional Response to Diagnosis, Current Treatment, and Prognosis:  Pt and granddaughter understand that pt is in need of STR-SNF in order to return home safely.   Emotional  Assessment Appearance:  Appears younger than stated age Attitude/Demeanor/Rapport:  Other (Appropriate) Affect (typically observed):  Accepting, Adaptable, Pleasant Orientation:  Oriented to Self, Oriented to Place, Oriented to  Time, Oriented to Situation Alcohol / Substance use:  Never Used Psych involvement (Current and /or in the community):  No (Comment)  Discharge Needs  Concerns to be addressed:  No discharge needs identified (Needs SNF) Readmission within the last 30 days:  No Current discharge risk:  None Barriers to Discharge:  No Barriers Identified   Darden Dates, LCSW 06/10/2015, 11:41 AM

## 2015-06-10 NOTE — Progress Notes (Signed)
[facility] [place] [facility]   PATIENT NAME: Christina [name]    MR#:  [id number]  DATE OF BIRTH:  20-Sep-1920  SUBJECTIVE:  CHIEF COMPLAINT:   Chief Complaint  Patient presents with  . Head Injury  . Hip Injury    Pain controlled at rest. Family at bedside. Worked with physical therapy earlier today was unable to bear any weight on the left.  REVIEW OF SYSTEMS:   Review of Systems  Constitutional: Negative for fever.  Respiratory: Negative for shortness of breath.   Cardiovascular: Negative for chest pain and palpitations.  Gastrointestinal: Negative for nausea, vomiting and abdominal pain.  Genitourinary: Negative for dysuria.  Musculoskeletal: Positive for back pain, joint pain and falls.    DRUG ALLERGIES:   Allergies  Allergen Reactions  . Atorvastatin Other (See Comments)    Reaction:  Blisters on feet   . Diltiazem Hcl Other (See Comments)    Reaction:  Unknown   . Dye Fdc Red [Red Dye] Other (See Comments)    Reaction:  Unknown   . Gemfibrozil Other (See Comments)    Reaction:  Unknown   . Niacin Other (See Comments)    Reaction:  Unknown   . Sulfasalazine Rash    VITALS:  Blood pressure 109/43, pulse 69, temperature 98.3 F (36.8 C), temperature source Oral, resp. rate 18, height 5\' 1"  (1.549 m), weight 54.976 kg (121 lb 3.2 oz), SpO2 94 %.  PHYSICAL EXAMINATION:  GENERAL:  81 y.o.-year-old patient lying in the bed with no acute distress.  EYES: Pupils equal, round, reactive to light and accommodation. No scleral icterus. Extraocular muscles intact.  HEENT: Bruising above the left eye, no tenderness, normocephalic. Oropharynx and nasopharynx clear. Mucus membranes are moist NECK:  Supple, no jugular venous distention. No thyroid enlargement, no tenderness.  LUNGS: Normal breath sounds bilaterally, no wheezing, rales,rhonchi or crepitation. No use of accessory muscles of respiration.  CARDIOVASCULAR: S1, S2 normal. No murmurs,  rubs, or gallops.  ABDOMEN: Soft, nontender, nondistended. Bowel sounds present. No organomegaly or mass.  EXTREMITIES: No pedal edema, cyanosis, or clubbing.  NEUROLOGIC: Cranial nerves II through XII are intact. Muscle strength 5/5 in all extremities. Sensation intact. Gait not checked.  PSYCHIATRIC: The patient is alert and oriented x 3.  SKIN: Bruising over the left eye, minimal bruising around the left hip   LABORATORY PANEL:   CBC  Recent Labs Lab 06/10/15 0457  WBC 9.0  HGB 12.4  HCT 37.3  PLT 149*   ------------------------------------------------------------------------------------------------------------------  Chemistries   Recent Labs Lab 06/09/15 1553 06/10/15 0457  NA 141 139  K 3.7 3.7  CL 105 106  CO2 26 27  GLUCOSE 129* 93  BUN 21* 16  CREATININE 0.81 0.76  CALCIUM 9.0 8.6*  AST 20  --   ALT 17  --   ALKPHOS 70  --   BILITOT 0.9  --    ------------------------------------------------------------------------------------------------------------------  Cardiac Enzymes  Recent Labs Lab 06/09/15 1553  TROPONINI <0.03   ------------------------------------------------------------------------------------------------------------------  RADIOLOGY:  Ct Head Wo Contrast  06/09/2015   CLINICAL DATA:  Slipped in bathtub this morning, hit head. Hematoma the left forehead.  EXAM: CT HEAD WITHOUT CONTRAST  TECHNIQUE: Contiguous axial images were obtained from the base of the skull through the vertex without intravenous contrast.  COMPARISON:  None.  FINDINGS: Left forehead soft tissue hematoma. No underlying calvarial abnormality. No acute intracranial abnormality. Specifically, no hemorrhage, hydrocephalus, mass lesion, acute infarction, or significant intracranial injury. No  acute calvarial abnormality. Visualized paranasal sinuses and mastoids clear. Orbital soft tissues unremarkable.  IMPRESSION: No acute intracranial abnormality.   Electronically Signed    By: Charlett Nose M.D.   On: 06/09/2015 12:34   Mr Hip Left Wo Contrast  06/09/2015   CLINICAL DATA:  Status post fall in the bathtub this morning with a twisting injury of the left hip. Pain with weight-bearing. Initial encounter.  EXAM: MR OF THE LEFT HIP WITHOUT CONTRAST  TECHNIQUE: Multiplanar, multisequence MR imaging was performed. No intravenous contrast was administered.  COMPARISON:  Plain films of the left hip this same day.  FINDINGS: Bones: The patient has a nondisplaced fracture of the left sacrum with associated marrow edema. Left parasymphyseal pubic bone fracture is also identified. No other fracture is seen. Specifically, there is no hip fracture. The femoral heads are located.  Articular cartilage and labrum  Articular cartilage:  Mildly degenerated.  Labrum:  Mildly degenerated.  Joint or bursal effusion  Joint effusion:  None.  Bursae: There is a small amount of fluid in the left trochanteric bursa compatible with bursitis.  Muscles and tendons  Muscles and tendons:  Intact.  Other findings  Miscellaneous: Extensive contusion and hematoma are seen in the cutaneous and subcutaneous fatty tissues about the left hip. The urinary bladder appears somewhat distended. Extensive sigmoid diverticular disease identified without diverticulitis.  IMPRESSION: Acute left sacral and left parasymphyseal pubic bone fractures.  Negative for hip fracture.  Extensive cutaneous and subcutaneous contusion hematoma above the left greater trochanter.  Small amount fluid in the left trochanteric bursa is compatible with bursitis, possibly posttraumatic.   Electronically Signed   By: Drusilla Kanner M.D.   On: 06/09/2015 15:26   Dg Hip Unilat With Pelvis 2-3 Views Left  06/09/2015   CLINICAL DATA:  Initial encounter for fall getting out of tub today. Landed on left hip. Pain.  EXAM: LEFT HIP (WITH PELVIS) 2-3 VIEWS  COMPARISON:  None.  FINDINGS: Frontal pelvis shows diffuse bony demineralization without evidence for  sacral fracture. No evidence for pubic ramus fracture.  AP and frog-leg lateral views of the right hip show no femoral neck fracture.  IMPRESSION: Negative.   Electronically Signed   By: Kennith Center M.D.   On: 06/09/2015 12:29    EKG:   Orders placed or performed during the hospital encounter of 06/09/15  . ED EKG  . ED EKG  . EKG 12-Lead  . EKG 12-Lead    ASSESSMENT AND PLAN:   Principal Problem:   Hip fracture Active Problems:   Essential hypertension   Coronary artery disease with unspecified angina pectoris   Osteoporosis  #1 hip fracture: Left sacral and parasymphyseal pubic bone fractures. Non-operable. Appreciate orthopedics consultation. Will need to continue to work with physical therapy. Pain controlled at rest but uncontrolled with activity. Will need skilled nursing facility placement. Case management discussing with family and bed search ongoing.  #2 hypertension: Blood pressures have been low on pain medication. Will hold amlodipine and lisinopril. Continue Coreg.  #3 coronary artery disease: No chest pain shortness of breath area stable. Continue with aspirin, statin, beta blocker, nitroglycerin  #4 osteoporosis: Would benefit from bisphosphonate. We'll discuss with patient in the morning. Can follow up with her primary care physician.   All the records are reviewed and case discussed with Care Management/Social Workerr. Management plans discussed with the patient, family and they are in agreement.  CODE STATUS: DO NOT RESUSCITATE   TOTAL TIME TAKING CARE OF  THIS PATIENT: 35 minutes.   POSSIBLE D/C IN 1 DAYS, DEPENDING ON CLINICAL CONDITION.  Her to than 50% of time spent in care coordination and counseling. Discussed care plan with patient, husband, daughter at the bedside   Elby Showers M.D on 06/10/2015 at 12:54 PM  Between 7am to 6pm - Pager - [phone]  After 6pm go to [email or web address] - password [email or web address] [facility]  [place] [facility]  Office   [phone]  CC: Primary care physician; Tillman Abide, MD

## 2015-06-11 DIAGNOSIS — I2510 Atherosclerotic heart disease of native coronary artery without angina pectoris: Secondary | ICD-10-CM | POA: Diagnosis not present

## 2015-06-11 DIAGNOSIS — M810 Age-related osteoporosis without current pathological fracture: Secondary | ICD-10-CM | POA: Diagnosis not present

## 2015-06-11 DIAGNOSIS — M25559 Pain in unspecified hip: Secondary | ICD-10-CM | POA: Diagnosis not present

## 2015-06-11 DIAGNOSIS — I340 Nonrheumatic mitral (valve) insufficiency: Secondary | ICD-10-CM | POA: Diagnosis not present

## 2015-06-11 DIAGNOSIS — M6281 Muscle weakness (generalized): Secondary | ICD-10-CM | POA: Diagnosis not present

## 2015-06-11 DIAGNOSIS — T148 Other injury of unspecified body region: Secondary | ICD-10-CM | POA: Diagnosis not present

## 2015-06-11 DIAGNOSIS — I351 Nonrheumatic aortic (valve) insufficiency: Secondary | ICD-10-CM | POA: Diagnosis not present

## 2015-06-11 DIAGNOSIS — E785 Hyperlipidemia, unspecified: Secondary | ICD-10-CM | POA: Diagnosis not present

## 2015-06-11 DIAGNOSIS — Z9181 History of falling: Secondary | ICD-10-CM | POA: Diagnosis not present

## 2015-06-11 DIAGNOSIS — G629 Polyneuropathy, unspecified: Secondary | ICD-10-CM | POA: Diagnosis not present

## 2015-06-11 DIAGNOSIS — M8008XD Age-related osteoporosis with current pathological fracture, vertebra(e), subsequent encounter for fracture with routine healing: Secondary | ICD-10-CM | POA: Diagnosis not present

## 2015-06-11 DIAGNOSIS — N119 Chronic tubulo-interstitial nephritis, unspecified: Secondary | ICD-10-CM | POA: Diagnosis not present

## 2015-06-11 DIAGNOSIS — M80052D Age-related osteoporosis with current pathological fracture, left femur, subsequent encounter for fracture with routine healing: Secondary | ICD-10-CM | POA: Diagnosis not present

## 2015-06-11 DIAGNOSIS — R2689 Other abnormalities of gait and mobility: Secondary | ICD-10-CM | POA: Diagnosis not present

## 2015-06-11 DIAGNOSIS — S3289XA Fracture of other parts of pelvis, initial encounter for closed fracture: Secondary | ICD-10-CM | POA: Diagnosis not present

## 2015-06-11 DIAGNOSIS — I739 Peripheral vascular disease, unspecified: Secondary | ICD-10-CM | POA: Diagnosis not present

## 2015-06-11 DIAGNOSIS — I10 Essential (primary) hypertension: Secondary | ICD-10-CM | POA: Diagnosis not present

## 2015-06-11 DIAGNOSIS — I25119 Atherosclerotic heart disease of native coronary artery with unspecified angina pectoris: Secondary | ICD-10-CM | POA: Diagnosis not present

## 2015-06-11 DIAGNOSIS — F4320 Adjustment disorder, unspecified: Secondary | ICD-10-CM | POA: Diagnosis not present

## 2015-06-11 DIAGNOSIS — M1990 Unspecified osteoarthritis, unspecified site: Secondary | ICD-10-CM | POA: Diagnosis not present

## 2015-06-11 LAB — BASIC METABOLIC PANEL
ANION GAP: 3 — AB (ref 5–15)
BUN: 16 mg/dL (ref 6–20)
CALCIUM: 8.4 mg/dL — AB (ref 8.9–10.3)
CO2: 29 mmol/L (ref 22–32)
Chloride: 108 mmol/L (ref 101–111)
Creatinine, Ser: 0.78 mg/dL (ref 0.44–1.00)
GFR calc Af Amer: >60 mL/min (ref >60)
GFR calc non Af Amer: >60 mL/min (ref >60)
Glucose, Bld: 122 mg/dL — ABNORMAL HIGH (ref 65–99)
POTASSIUM: 4.1 mmol/L (ref 3.5–5.1)
Sodium: 140 mmol/L (ref 135–145)

## 2015-06-11 LAB — CBC
HCT: 35.5 % (ref 35.0–47.0)
HEMOGLOBIN: 11.9 g/dL — AB (ref 12.0–16.0)
MCH: 31.7 pg (ref 26.0–34.0)
MCHC: 33.5 g/dL (ref 32.0–36.0)
MCV: 94.6 fL (ref 80.0–100.0)
Platelets: 136 10*3/uL — ABNORMAL LOW (ref 150–440)
RBC: 3.75 MIL/uL — AB (ref 3.80–5.20)
RDW: 14.3 % (ref 11.5–14.5)
WBC: 10.5 10*3/uL (ref 3.6–11.0)

## 2015-06-11 MED ORDER — ACETAMINOPHEN 325 MG PO TABS: 650.0000 mg | ORAL_TABLET | Freq: Four times a day (QID) | ORAL | Status: DC | PRN

## 2015-06-11 MED ORDER — DOCUSATE SODIUM 100 MG PO CAPS: 100.0000 mg | ORAL_CAPSULE | Freq: Two times a day (BID) | ORAL | Status: DC

## 2015-06-11 MED ORDER — HYDROCODONE-ACETAMINOPHEN 5-325 MG PO TABS: 1.0000 | ORAL_TABLET | ORAL | Status: DC | PRN

## 2015-06-11 NOTE — Discharge Summary (Signed)
[facility] - [place] at [facility]  DISCHARGE SUMMARY   PATIENT NAME: Christina [name]    MR#:  [id number]  DATE OF BIRTH:  1920/04/16  DATE OF ADMISSION:  06/09/2015 ADMITTING PHYSICIAN: Auburn Bilberry, MD  DATE OF DISCHARGE: 06/10/2015  PRIMARY CARE PHYSICIAN: Tillman Abide, MD    ADMISSION DIAGNOSIS:  Pain [R52] Fall [W19.XXXA] Inability to bear weight [Z91.89] Acute hip pain, left [M25.552] Multiple pelvic fractures, closed, initial encounter [S32.810A]  DISCHARGE DIAGNOSIS:  Principal Problem:   Hip fracture Active Problems:   Essential hypertension   Coronary artery disease with unspecified angina pectoris   Osteoporosis   SECONDARY DIAGNOSIS:   Past Medical History  Diagnosis Date  . CAD (coronary artery disease)     a. S/P previous MI;  b. 04/2007 Cath/PCI: Taxus DES' to mid/distal RCA and RPDA as well as Ramus;  c. myoview 8/09: EF 78%, normal perfusion  . Hypertension   . Osteoporosis   . Peripheral vascular disease   . Hyperlipidemia     Intolerant of many statins  . Chronic shoulder pain   . Chronic back pain   . Osteoarthritis   . Neuropathy   . Mild mitral and aortic regurgitation     a. 11/2011 Echo: EF 55-65%, No RWMA, Gr 1 DD, Mild AI/MR.  . Carotid stenosis     Bilateral, mild to moderate    HOSPITAL COURSE:   #1 Left sacral and parasymphyseal pubic bone fractures. Non-operable. Appreciate orthopedics consultation, she will follow-up in one month for repeat x-rays. Will need to continue to work with physical therapy, being discharged to skilled nursing at [facility] for continued therapy. Pain controlled well with Tylenol. Could use when necessary Percocet for more severe pain.  #2 hypertension: Blood pressures have been low on pain medication on admission. Now increasing. She had been on IV fluids which are being stopped at this time. Prior to admission she was on Norvasc 5 and lisinopril 10 mg daily. These have been held  for now due to low blood pressure. Continue with Coreg. Adjust blood pressure medications as needed going forward.  #3 coronary artery disease: No chest pain shortness of breath area stable. Continue with aspirin, statin, beta blocker, nitroglycerin  #4 osteoporosis: Would benefit from bisphosphonate. She will follow up with her primary care physician in one week.  DISCHARGE CONDITIONS:   Stable  CONSULTS OBTAINED:  Treatment Team:  Auburn Bilberry, MD  Dr. Leron Croak, orthopedics  DRUG ALLERGIES:   Allergies  Allergen Reactions  . Atorvastatin Other (See Comments)    Reaction:  Blisters on feet   . Diltiazem Hcl Other (See Comments)    Reaction:  Unknown   . Dye Fdc Red [Red Dye] Other (See Comments)    Reaction:  Unknown   . Gemfibrozil Other (See Comments)    Reaction:  Unknown   . Niacin Other (See Comments)    Reaction:  Unknown   . Sulfasalazine Rash    DISCHARGE MEDICATIONS:   Current Discharge Medication List    START taking these medications   Details  acetaminophen (TYLENOL) 325 MG tablet Take 2 tablets (650 mg total) by mouth every 6 (six) hours as needed for mild pain (or Fever >/= 101). Qty: 60 tablet, Refills: 6    docusate sodium (COLACE) 100 MG capsule Take 1 capsule (100 mg total) by mouth 2 (two) times daily. Qty: 10 capsule, Refills: 0    HYDROcodone-acetaminophen (NORCO/VICODIN) 5-325 MG per tablet Take 1-2 tablets by mouth  every 4 (four) hours as needed for moderate pain. Qty: 30 tablet, Refills: 0      CONTINUE these medications which have NOT CHANGED   Details  amLODipine (NORVASC) 5 MG tablet Take 5 mg by mouth daily.    aspirin EC 81 MG tablet Take 81 mg by mouth daily.    carvedilol (COREG) 3.125 MG tablet Take 3.125 mg by mouth 2 (two) times daily.    gabapentin (NEURONTIN) 300 MG capsule Take 300 mg by mouth 3 (three) times daily.    isosorbide mononitrate (IMDUR) 60 MG 24 hr tablet Take 60 mg by mouth daily.    MEGARED OMEGA-3  KRILL OIL 500 MG CAPS Take 1 capsule by mouth 2 (two) times daily.    MELATONIN PO Take 1 tablet by mouth at bedtime.    mometasone (NASONEX) 50 MCG/ACT nasal spray Place 2 sprays into the nose daily as needed. Qty: 17 g, Refills: 11    Multiple Vitamins-Minerals (MULTIVITAMIN PO) Take 1 tablet by mouth daily.    nitroGLYCERIN (NITROSTAT) 0.4 MG SL tablet Place 1 tablet (0.4 mg total) under the tongue every 5 (five) minutes as needed for chest pain. Qty: 25 tablet, Refills: 3    rosuvastatin (CRESTOR) 5 MG tablet Take 5 mg by mouth daily.      STOP taking these medications     lisinopril (PRINIVIL,ZESTRIL) 10 MG tablet          DISCHARGE INSTRUCTIONS:     DIET:  Cardiac diet  DISCHARGE CONDITION:  Stable  ACTIVITY:  Activity as tolerated per physical therapy recommendation, partial weightbearing on the right  OXYGEN:  Home Oxygen: No.   Oxygen Delivery: room air  DISCHARGE LOCATION:  nursing home , [place] for skilled nursing physical therapy  If you experience worsening of your admission symptoms, develop shortness of breath, life threatening emergency, suicidal or homicidal thoughts you must seek medical attention immediately by calling 911 or calling your MD immediately  if symptoms less severe.  You Must read complete instructions/literature along with all the possible adverse reactions/side effects for all the Medicines you take and that have been prescribed to you. Take any new Medicines after you have completely understood and accpet all the possible adverse reactions/side effects.   Please note  You were cared for by a hospitalist during your hospital stay. If you have any questions about your discharge medications or the care you received while you were in the hospital after you are discharged, you can call the unit and asked to speak with the hospitalist on call if the hospitalist that took care of you is not available. Once you are discharged, your  primary care physician will handle any further medical issues. Please note that NO REFILLS for any discharge medications will be authorized once you are discharged, as it is imperative that you return to your primary care physician (or establish a relationship with a primary care physician if you do not have one) for your aftercare needs so that they can reassess your need for medications and monitor your lab values.   Today   CHIEF COMPLAINT:   Chief Complaint  Patient presents with  . Head Injury  . Hip Injury    HISTORY OF PRESENT ILLNESS:  HISTORY OF PRESENT ILLNESS: Christina [name] is a 81 y.o. female with a known history of coronary artery disease hypertension osteoporosis peripheral vascular disease hyperlipidemia who uses a walker and a cane intermittently. Who fell in the bathroom. She was brought to the  emergency room because of significant hip pain and the inability to ambulate. Initial x-rays of the pelvis and left hip were unremarkable. An MRI scan of the pelvis was obtained which demonstrates increased bone marrow signal in the left sacral region, as well as in the left pubic bone near the pubic symphysis, consistent with acute fractures. The MRI scan also demonstrated increased fluid in the lateral hip region, consistent with bursitis versus an acute hematoma. The patient denies any loss of consciousness and denies any other injuries. She also denies any dizziness, lightheadedness, chest pain. Patient was seen by orthopedics who recommended likely medical management and activity as tolerated with partial left leg weightbearing.   VITAL SIGNS:  Blood pressure 160/55, pulse 66, temperature 98 F (36.7 C), temperature source Oral, resp. rate 17, height  (1.549 m), weight 54.976 kg (121 lb 3.2 oz), SpO2 97 %.  I/O:   Intake/Output Summary (Last 24 hours) at 06/11/15 0845 Last data filed at 06/11/15 0806  Gross per 24 hour  Intake   1735 ml  Output   1150 ml  Net    585 ml     PHYSICAL EXAMINATION:  GENERAL: 81 y.o.-year-old patient lying in the bed with no acute distress.  EYES: Pupils equal, round, reactive to light and accommodation. No scleral icterus. Extraocular muscles intact.  HEENT: Bruising above the left eye, no tenderness, normocephalic. Oropharynx and nasopharynx clear. Mucus membranes are moist NECK: Supple, no jugular venous distention. No thyroid enlargement, no tenderness.  LUNGS: Normal breath sounds bilaterally, no wheezing, rales,rhonchi or crepitation. No use of accessory muscles of respiration.  CARDIOVASCULAR: S1, S2 normal. No murmurs, rubs, or gallops.  ABDOMEN: Soft, nontender, nondistended. Bowel sounds present. No organomegaly or mass.  EXTREMITIES: No pedal edema, cyanosis, or clubbing.  NEUROLOGIC: Cranial nerves II through XII are intact. Muscle strength 5/5 in all extremities. Sensation intact. Gait not checked.  PSYCHIATRIC: The patient is alert and oriented x 3.  SKIN: Bruising over the left eye, minimal bruising around the left hip  DATA REVIEW:   CBC  Recent Labs Lab 06/11/15 0511  WBC 10.5  HGB 11.9*  HCT 35.5  PLT 136*    Chemistries   Recent Labs Lab 06/09/15 1553  06/11/15 0511  NA 141  < > 140  K 3.7  < > 4.1  CL 105  < > 108  CO2 26  < > 29  GLUCOSE 129*  < > 122*  BUN 21*  < > 16  CREATININE 0.81  < > 0.78  CALCIUM 9.0  < > 8.4*  AST 20  --   --   ALT 17  --   --   ALKPHOS 70  --   --   BILITOT 0.9  --   --   < > = values in this interval not displayed.  Cardiac Enzymes  Recent Labs Lab 06/09/15 1553  TROPONINI <0.03    Microbiology Results  No results found for this or any previous visit.  RADIOLOGY:  Ct Head Wo Contrast  06/09/2015   CLINICAL DATA:  Slipped in bathtub this morning, hit head. Hematoma the left forehead.  EXAM: CT HEAD WITHOUT CONTRAST  TECHNIQUE: Contiguous axial images were obtained from the base of the skull through the vertex without intravenous  contrast.  COMPARISON:  None.  FINDINGS: Left forehead soft tissue hematoma. No underlying calvarial abnormality. No acute intracranial abnormality. Specifically, no hemorrhage, hydrocephalus, mass lesion, acute infarction, or significant intracranial injury. No acute calvarial  abnormality. Visualized paranasal sinuses and mastoids clear. Orbital soft tissues unremarkable.  IMPRESSION: No acute intracranial abnormality.   Electronically Signed   By: Charlett Nose M.D.   On: 06/09/2015 12:34   Mr Hip Left Wo Contrast  06/09/2015   CLINICAL DATA:  Status post fall in the bathtub this morning with a twisting injury of the left hip. Pain with weight-bearing. Initial encounter.  EXAM: MR OF THE LEFT HIP WITHOUT CONTRAST  TECHNIQUE: Multiplanar, multisequence MR imaging was performed. No intravenous contrast was administered.  COMPARISON:  Plain films of the left hip this same day.  FINDINGS: Bones: The patient has a nondisplaced fracture of the left sacrum with associated marrow edema. Left parasymphyseal pubic bone fracture is also identified. No other fracture is seen. Specifically, there is no hip fracture. The femoral heads are located.  Articular cartilage and labrum  Articular cartilage:  Mildly degenerated.  Labrum:  Mildly degenerated.  Joint or bursal effusion  Joint effusion:  None.  Bursae: There is a small amount of fluid in the left trochanteric bursa compatible with bursitis.  Muscles and tendons  Muscles and tendons:  Intact.  Other findings  Miscellaneous: Extensive contusion and hematoma are seen in the cutaneous and subcutaneous fatty tissues about the left hip. The urinary bladder appears somewhat distended. Extensive sigmoid diverticular disease identified without diverticulitis.  IMPRESSION: Acute left sacral and left parasymphyseal pubic bone fractures.  Negative for hip fracture.  Extensive cutaneous and subcutaneous contusion hematoma above the left greater trochanter.  Small amount fluid in the  left trochanteric bursa is compatible with bursitis, possibly posttraumatic.   Electronically Signed   By: Drusilla Kanner M.D.   On: 06/09/2015 15:26   Dg Hip Unilat With Pelvis 2-3 Views Left  06/09/2015   CLINICAL DATA:  Initial encounter for fall getting out of tub today. Landed on left hip. Pain.  EXAM: LEFT HIP (WITH PELVIS) 2-3 VIEWS  COMPARISON:  None.  FINDINGS: Frontal pelvis shows diffuse bony demineralization without evidence for sacral fracture. No evidence for pubic ramus fracture.  AP and frog-leg lateral views of the right hip show no femoral neck fracture.  IMPRESSION: Negative.   Electronically Signed   By: Kennith Center M.D.   On: 06/09/2015 12:29    EKG:   Orders placed or performed during the hospital encounter of 06/09/15  . ED EKG  . ED EKG  . EKG 12-Lead  . EKG 12-Lead      Management plans discussed with the patient, family and they are in agreement.  CODE STATUS:     Code Status Orders        Start     Ordered   06/09/15 1834  Do not attempt resuscitation (DNR)   Continuous    Question Answer Comment  In the event of cardiac or respiratory ARREST Do not call a "code blue"   In the event of cardiac or respiratory ARREST Do not perform Intubation, CPR, defibrillation or ACLS   In the event of cardiac or respiratory ARREST Use medication by any route, position, wound care, and other measures to relive pain and suffering. May use oxygen, suction and manual treatment of airway obstruction as needed for comfort.      06/09/15 1835      TOTAL TIME TAKING CARE OF THIS PATIENT: 40 minutes.   Greater than 50% of time spent in coordination of care and counseling patient.   Elby Showers M.D on 06/11/2015 at 8:45 AM  Between 7am  to 6pm - Pager - [phone]  After 6pm go to [email or web address] - password [email or web address] [facility]  [place] [facility]  Office  [phone]  CC: Primary care physician; Tillman Abide, MD

## 2015-06-11 NOTE — Progress Notes (Signed)
Patients VSS this shift. Being transferred to [facility] today. Belongings packed. IV removed. Report called to [facility] at [facility] called.

## 2015-06-11 NOTE — Clinical Social Work Placement (Signed)
   CLINICAL SOCIAL WORK PLACEMENT  NOTE  Date:  06/11/2015  Patient Details  Name: Christina [name] MRN: [id number] Date of Birth: [birth year]  Clinical Social Work is seeking post-discharge placement for this patient at the Skilled  Nursing Facility level of care (*CSW will initial, date and re-position this form in  chart as items are completed):  Yes   Patient/family provided with [place] Clinical Social Work Department's list of facilities offering this level of care within the geographic area requested by the patient (or if unable, by the patient's family).  Yes   Patient/family informed of their freedom to choose among providers that offer the needed level of care, that participate in Medicare, Medicaid or managed care program needed by the patient, have an available bed and are willing to accept the patient.  Yes   Patient/family informed of [place]'s ownership interest in [facility], [facility] and [facility], as well as of the fact that they are under no obligation to receive care at these facilities.  PASRR submitted to EDS on 06/10/15     PASRR number received on 06/10/15     Existing PASRR number confirmed on       FL2 transmitted to all facilities in geographic area requested by pt/family on 06/10/15     FL2 transmitted to all facilities within larger geographic area on       Patient informed that his/her managed care company has contracts with or will negotiate with certain facilities, including the following:        Yes   Patient/family informed of bed offers received.  Patient chooses bed at  [facility] )     Physician recommends and patient chooses bed at      Patient to be transferred to  [facility] ) on 06/11/15.  Patient to be transferred to facility by  (EMS )     Patient family notified on 06/11/15 of transfer.  Name of family member notified:   Judeth Cornfield granddaughter was notified via telephone. )     PHYSICIAN       Additional Comment:     _______________________________________________ Haig Prophet, LCSW 06/11/2015, 10:48 AM

## 2015-06-11 NOTE — Progress Notes (Signed)
Patient is medically stable for D/C to [facility] today. Per Kim admissions coordinator at [facility] patient is going to room 208-A. RN will call report at [phone] and arrange EMS for transport. Clinical Child psychotherapist (CSW) prepared D/C packet and sent D/C Summary and follow up appointments to [employer] via carefinder. Patient is aware of above. CSW contacted patient's daughter [name] and granddaughter Judeth Cornfield and made them aware of above. CSW left voicemail for [name] patient's son. RN will call granddaughter Judeth Cornfield when EMS arrives to transport patient. Please reconsult if future social work needs arise. CSW signing off.   Jetta Lout, LCSWA [phone]

## 2015-06-11 NOTE — Progress Notes (Signed)
Spoke with Carson Myrtle, [facility] rep at [phone], to notify of non-emergent EMS transport.  Auth notification reference given as [id number].   Service date range good from 06/11/15 - 09/09/15.   Gap exception requested to determine if services can be considered at an in-network level.

## 2015-06-14 ENCOUNTER — Telehealth: Payer: Self-pay

## 2015-06-14 DIAGNOSIS — N119 Chronic tubulo-interstitial nephritis, unspecified: Secondary | ICD-10-CM | POA: Diagnosis not present

## 2015-06-14 DIAGNOSIS — M810 Age-related osteoporosis without current pathological fracture: Secondary | ICD-10-CM | POA: Diagnosis not present

## 2015-06-14 DIAGNOSIS — F4320 Adjustment disorder, unspecified: Secondary | ICD-10-CM | POA: Diagnosis not present

## 2015-06-14 NOTE — Telephone Encounter (Signed)
Discussed with daughter She already told her mom--which I think is the right thing to have done Brother (patient's son) critically ill and likely will die from fairly massive stroke Discussed prognosis

## 2015-06-14 NOTE — Telephone Encounter (Signed)
Christina [name] pts daughter left v/m that pts son has had massive stroke and pt has not been told yet; Christina [name] wants to speak with Dr Alphonsus Sias prior to telling pt about her son's condition. Christina [name] request cb from Dr Alphonsus Sias.

## 2015-06-17 ENCOUNTER — Ambulatory Visit: Payer: Medicare Other | Admitting: Internal Medicine

## 2015-06-18 ENCOUNTER — Encounter
Admission: RE | Admit: 2015-06-18 | Discharge: 2015-06-18 | Disposition: A | Discharge status: Home / Self Care | Payer: Medicare Other | Source: Ambulatory Visit | Attending: Internal Medicine | Admitting: Internal Medicine

## 2015-07-02 ENCOUNTER — Inpatient Hospital Stay
Admission: EM | Admit: 2015-07-02 | Discharge: 2015-07-03 | DRG: 068 | Disposition: A | Discharge status: Home Health | Payer: Medicare Other | Attending: Internal Medicine | Admitting: Internal Medicine

## 2015-07-02 ENCOUNTER — Inpatient Hospital Stay: Payer: Medicare Other

## 2015-07-02 ENCOUNTER — Emergency Department: Payer: Medicare Other

## 2015-07-02 ENCOUNTER — Encounter: Payer: Self-pay | Admitting: *Deleted

## 2015-07-02 ENCOUNTER — Ambulatory Visit: Payer: Medicare Other | Admitting: Internal Medicine

## 2015-07-02 ENCOUNTER — Inpatient Hospital Stay (HOSPITAL_COMMUNITY)
Admit: 2015-07-02 | Discharge: 2015-07-02 | Disposition: A | Discharge status: Home / Self Care | Payer: Medicare Other | Attending: Internal Medicine | Admitting: Internal Medicine

## 2015-07-02 ENCOUNTER — Telehealth: Payer: Self-pay | Admitting: Internal Medicine

## 2015-07-02 DIAGNOSIS — Z888 Allergy status to other drugs, medicaments and biological substances status: Secondary | ICD-10-CM

## 2015-07-02 DIAGNOSIS — I6529 Occlusion and stenosis of unspecified carotid artery: Principal | ICD-10-CM | POA: Diagnosis present

## 2015-07-02 DIAGNOSIS — Z7982 Long term (current) use of aspirin: Secondary | ICD-10-CM | POA: Diagnosis not present

## 2015-07-02 DIAGNOSIS — R42 Dizziness and giddiness: Secondary | ICD-10-CM | POA: Diagnosis not present

## 2015-07-02 DIAGNOSIS — I7389 Other specified peripheral vascular diseases: Secondary | ICD-10-CM | POA: Diagnosis present

## 2015-07-02 DIAGNOSIS — I2510 Atherosclerotic heart disease of native coronary artery without angina pectoris: Secondary | ICD-10-CM | POA: Diagnosis present

## 2015-07-02 DIAGNOSIS — M810 Age-related osteoporosis without current pathological fracture: Secondary | ICD-10-CM | POA: Diagnosis not present

## 2015-07-02 DIAGNOSIS — G629 Polyneuropathy, unspecified: Secondary | ICD-10-CM | POA: Diagnosis present

## 2015-07-02 DIAGNOSIS — Z955 Presence of coronary angioplasty implant and graft: Secondary | ICD-10-CM | POA: Diagnosis not present

## 2015-07-02 DIAGNOSIS — I639 Cerebral infarction, unspecified: Secondary | ICD-10-CM

## 2015-07-02 DIAGNOSIS — M1990 Unspecified osteoarthritis, unspecified site: Secondary | ICD-10-CM | POA: Diagnosis present

## 2015-07-02 DIAGNOSIS — G8929 Other chronic pain: Secondary | ICD-10-CM | POA: Diagnosis present

## 2015-07-02 DIAGNOSIS — I10 Essential (primary) hypertension: Secondary | ICD-10-CM | POA: Diagnosis not present

## 2015-07-02 DIAGNOSIS — I080 Rheumatic disorders of both mitral and aortic valves: Secondary | ICD-10-CM | POA: Diagnosis present

## 2015-07-02 DIAGNOSIS — Z79891 Long term (current) use of opiate analgesic: Secondary | ICD-10-CM | POA: Diagnosis not present

## 2015-07-02 DIAGNOSIS — Z79899 Other long term (current) drug therapy: Secondary | ICD-10-CM | POA: Diagnosis not present

## 2015-07-02 DIAGNOSIS — R110 Nausea: Secondary | ICD-10-CM | POA: Diagnosis not present

## 2015-07-02 DIAGNOSIS — M25519 Pain in unspecified shoulder: Secondary | ICD-10-CM | POA: Diagnosis present

## 2015-07-02 DIAGNOSIS — E785 Hyperlipidemia, unspecified: Secondary | ICD-10-CM | POA: Diagnosis present

## 2015-07-02 DIAGNOSIS — M549 Dorsalgia, unspecified: Secondary | ICD-10-CM | POA: Diagnosis present

## 2015-07-02 DIAGNOSIS — I252 Old myocardial infarction: Secondary | ICD-10-CM | POA: Diagnosis not present

## 2015-07-02 DIAGNOSIS — R29810 Facial weakness: Secondary | ICD-10-CM | POA: Diagnosis not present

## 2015-07-02 DIAGNOSIS — Z66 Do not resuscitate: Secondary | ICD-10-CM | POA: Diagnosis present

## 2015-07-02 DIAGNOSIS — I6523 Occlusion and stenosis of bilateral carotid arteries: Secondary | ICD-10-CM | POA: Diagnosis present

## 2015-07-02 LAB — URINALYSIS COMPLETE WITH MICROSCOPIC (ARMC ONLY)
Bilirubin Urine: NEGATIVE
GLUCOSE, UA: NEGATIVE mg/dL
HGB URINE DIPSTICK: NEGATIVE
Ketones, ur: NEGATIVE mg/dL
Nitrite: POSITIVE — AB
PH: 8 (ref 5.0–8.0)
Protein, ur: NEGATIVE mg/dL
Specific Gravity, Urine: 1.004 — ABNORMAL LOW (ref 1.005–1.030)

## 2015-07-02 LAB — CBC WITH DIFFERENTIAL/PLATELET
BASOS ABS: 0 10*3/uL (ref 0–0.1)
BASOS PCT: 1 %
EOS ABS: 0.2 10*3/uL (ref 0–0.7)
Eosinophils Relative: 3 %
HCT: 36.6 % (ref 35.0–47.0)
HEMOGLOBIN: 12.2 g/dL (ref 12.0–16.0)
Lymphocytes Relative: 22 %
Lymphs Abs: 1.7 10*3/uL (ref 1.0–3.6)
MCH: 31.2 pg (ref 26.0–34.0)
MCHC: 33.4 g/dL (ref 32.0–36.0)
MCV: 93.5 fL (ref 80.0–100.0)
MONO ABS: 0.7 10*3/uL (ref 0.2–0.9)
Monocytes Relative: 10 %
NEUTROS PCT: 64 %
Neutro Abs: 5.1 10*3/uL (ref 1.4–6.5)
PLATELETS: 249 10*3/uL (ref 150–440)
RBC: 3.92 MIL/uL (ref 3.80–5.20)
RDW: 14.5 % (ref 11.5–14.5)
WBC: 7.8 10*3/uL (ref 3.6–11.0)

## 2015-07-02 LAB — COMPREHENSIVE METABOLIC PANEL
ALK PHOS: 195 U/L — AB (ref 38–126)
ALT: 13 U/L — ABNORMAL LOW (ref 14–54)
AST: 25 U/L (ref 15–41)
Albumin: 3.3 g/dL — ABNORMAL LOW (ref 3.5–5.0)
Anion gap: 8 (ref 5–15)
BUN: 22 mg/dL — AB (ref 6–20)
CALCIUM: 9.1 mg/dL (ref 8.9–10.3)
CO2: 26 mmol/L (ref 22–32)
CREATININE: 0.82 mg/dL (ref 0.44–1.00)
Chloride: 108 mmol/L (ref 101–111)
GFR calc Af Amer: >60 mL/min (ref >60)
GFR, EST NON AFRICAN AMERICAN: 60 mL/min — AB (ref >60)
Glucose, Bld: 101 mg/dL — ABNORMAL HIGH (ref 65–99)
Potassium: 5.3 mmol/L — ABNORMAL HIGH (ref 3.5–5.1)
Sodium: 142 mmol/L (ref 135–145)
TOTAL PROTEIN: 7.2 g/dL (ref 6.5–8.1)
Total Bilirubin: 1.1 mg/dL (ref 0.3–1.2)

## 2015-07-02 LAB — LIPID PANEL
Cholesterol: 113 mg/dL (ref 0–200)
HDL: 39 mg/dL — ABNORMAL LOW (ref >40)
LDL Cholesterol: 54 mg/dL (ref 0–99)
Total CHOL/HDL Ratio: 2.9 RATIO
Triglycerides: 102 mg/dL (ref <150)
VLDL: 20 mg/dL (ref 0–40)

## 2015-07-02 LAB — POTASSIUM: Potassium: 4.2 mmol/L (ref 3.5–5.1)

## 2015-07-02 LAB — TROPONIN I: Troponin I: <0.03 ng/mL (ref <0.031)

## 2015-07-02 MED ORDER — ASPIRIN 81 MG PO CHEW
324.0000 mg | CHEWABLE_TABLET | Freq: Once | ORAL | Status: AC
  Administered 2015-07-02: 324 mg via ORAL
  Filled 2015-07-02: qty 4

## 2015-07-02 MED ORDER — HYDROCODONE-ACETAMINOPHEN 5-325 MG PO TABS: 1.0000 | ORAL_TABLET | ORAL | Status: DC | PRN

## 2015-07-02 MED ORDER — FLUTICASONE PROPIONATE 50 MCG/ACT NA SUSP
1.0000 | Freq: Every day | NASAL | Status: DC
  Administered 2015-07-02: 1 via NASAL
  Filled 2015-07-02: qty 16

## 2015-07-02 MED ORDER — SODIUM CHLORIDE 0.9 % IJ SOLN
3.0000 mL | Freq: Two times a day (BID) | INTRAMUSCULAR | Status: DC
  Administered 2015-07-02 – 2015-07-03 (×3): 3 mL via INTRAVENOUS

## 2015-07-02 MED ORDER — ONDANSETRON HCL 4 MG/2ML IJ SOLN
4.0000 mg | Freq: Once | INTRAMUSCULAR | Status: AC
  Administered 2015-07-02: 4 mg via INTRAVENOUS
  Filled 2015-07-02: qty 2

## 2015-07-02 MED ORDER — ADULT MULTIVITAMIN W/MINERALS CH
1.0000 | ORAL_TABLET | Freq: Every day | ORAL | Status: DC
  Administered 2015-07-02 – 2015-07-03 (×2): 1 via ORAL
  Filled 2015-07-02 (×2): qty 1

## 2015-07-02 MED ORDER — ISOSORBIDE MONONITRATE ER 60 MG PO TB24
60.0000 mg | ORAL_TABLET | Freq: Every day | ORAL | Status: DC
  Administered 2015-07-02 – 2015-07-03 (×2): 60 mg via ORAL
  Filled 2015-07-02 (×2): qty 1

## 2015-07-02 MED ORDER — LISINOPRIL 10 MG PO TABS
10.0000 mg | ORAL_TABLET | Freq: Every day | ORAL | Status: DC
  Administered 2015-07-02 – 2015-07-03 (×2): 10 mg via ORAL
  Filled 2015-07-02 (×2): qty 1

## 2015-07-02 MED ORDER — GABAPENTIN 100 MG PO CAPS
300.0000 mg | ORAL_CAPSULE | Freq: Three times a day (TID) | ORAL | Status: DC
  Administered 2015-07-02 – 2015-07-03 (×4): 300 mg via ORAL
  Filled 2015-07-02 (×4): qty 3

## 2015-07-02 MED ORDER — ROSUVASTATIN CALCIUM 5 MG PO TABS
5.0000 mg | ORAL_TABLET | Freq: Every day | ORAL | Status: DC
Start: 2015-07-02 — End: 2015-07-03
  Administered 2015-07-02 – 2015-07-03 (×2): 5 mg via ORAL
  Filled 2015-07-02 (×2): qty 1

## 2015-07-02 MED ORDER — DOCUSATE SODIUM 100 MG PO CAPS
100.0000 mg | ORAL_CAPSULE | Freq: Two times a day (BID) | ORAL | Status: DC
  Administered 2015-07-02 – 2015-07-03 (×3): 100 mg via ORAL
  Filled 2015-07-02 (×3): qty 1

## 2015-07-02 MED ORDER — ASPIRIN EC 81 MG PO TBEC
81.0000 mg | DELAYED_RELEASE_TABLET | Freq: Every day | ORAL | Status: DC
  Administered 2015-07-02 – 2015-07-03 (×2): 81 mg via ORAL
  Filled 2015-07-02 (×2): qty 1

## 2015-07-02 MED ORDER — SODIUM CHLORIDE 0.9 % IV BOLUS (SEPSIS)
500.0000 mL | Freq: Once | INTRAVENOUS | Status: AC
  Administered 2015-07-02: 500 mL via INTRAVENOUS

## 2015-07-02 NOTE — H&P (Signed)
[facility] - [place] at [facility]   PATIENT NAME: Christina [name]    MR#:  [id number]  DATE OF BIRTH:  21-Nov-1920  DATE OF ADMISSION:  07/02/2015  PRIMARY CARE PHYSICIAN: Tillman Abide, MD   REQUESTING/REFERRING PHYSICIAN: Loleta Rose  CHIEF COMPLAINT:   Chief Complaint  Patient presents with  . Dizziness    HISTORY OF PRESENT ILLNESS: Christina [name]  is a 81 y.o. female with a known history of coronary artery disease, hypertension, osteoporosis, peripheral vascular disease, hyperlipidemia, recent pelvic fracture- after that she was sent to rehabilitation and went home 2 days ago, was doing fine. Able to walk with a walker until last night. She was so well, around 3 in the morning as her dog wanted to pee. He then slept again and try to wake up in the morning, she could not get out of the bed as she was feeling clinically out of balance, and was fearful that she might fall down. For this she came to emergency room, CT scan of the head showed mild small vessel disease and small basal ganglia lacunar type infarct, was given to hospitalist team for admission for stroke. On further questioning she denied any focal weakness or numbness, headache headache, tingling, visual changes, speech changes.  PAST MEDICAL HISTORY:   Past Medical History  Diagnosis Date  . CAD (coronary artery disease)     a. S/P previous MI;  b. 04/2007 Cath/PCI: Taxus DES' to mid/distal RCA and RPDA as well as Ramus;  c. myoview 8/09: EF 78%, normal perfusion  . Hypertension   . Osteoporosis   . Peripheral vascular disease   . Hyperlipidemia     Intolerant of many statins  . Chronic shoulder pain   . Chronic back pain   . Osteoarthritis   . Neuropathy   . Mild mitral and aortic regurgitation     a. 11/2011 Echo: EF 55-65%, No RWMA, Gr 1 DD, Mild AI/MR.  . Carotid stenosis     Bilateral, mild to moderate    PAST SURGICAL HISTORY:  Past Surgical History  Procedure Laterality Date  .  Carotid u/s  10/2006    0-39% bilaterally  . Vesicovaginal fistula closure w/ tah    . Coronary stent placement  2002    MI, [facility]  . Coronary angioplasty  12/2002    Medical rx only  . Breast biopsy      Bilateral  . Nm myoview ltd  11/2004    Stress, negative; NL EF  . Cp admit  09/2005    Stress myoview negative  . Coronary angioplasty  01/2006    LAD/PAD disease  . Adenosine myoview  01/2006    No ischemia, EF 70%  . Cataract extraction  09/2006    Right  . Coronary angioplasty  03/2007    3 vessel disease  . Coronary stent placement  04/2007    RCA/LCX multiple, [facility]  . Femoral hernia repair  05/2007    Incarcerated, right  . Fracture surgery  03/2009    Left leg    SOCIAL HISTORY:  History  Substance Use Topics  . Smoking status: Never Smoker   . Smokeless tobacco: Never Used  . Alcohol Use: No    FAMILY HISTORY:  Family History  Problem Relation Age of Onset  . Pulmonary embolism Mother   . Heart disease Father   . Heart disease Sister   . Cancer Brother   . Heart disease Sister     DRUG  ALLERGIES:  Allergies  Allergen Reactions  . Atorvastatin Other (See Comments)    Reaction:  Blisters on feet   . Diltiazem Hcl Other (See Comments)    Reaction:  Unknown   . Dye Fdc Red [Red Dye] Other (See Comments)    Reaction:  Unknown   . Gemfibrozil Other (See Comments)    Reaction:  Unknown   . Niacin Other (See Comments)    Reaction:  Unknown   . Sulfasalazine Rash    REVIEW OF SYSTEMS:   CONSTITUTIONAL: No fever, fatigue or weakness. But have loss of balance. EYES: No blurred or double vision.  EARS, NOSE, AND THROAT: No tinnitus or ear pain.  RESPIRATORY: No cough, shortness of breath, wheezing or hemoptysis.  CARDIOVASCULAR: No chest pain, orthopnea, edema.  GASTROINTESTINAL: No nausea, vomiting, diarrhea or abdominal pain.  GENITOURINARY: No dysuria, hematuria.  ENDOCRINE: No polyuria, nocturia,  HEMATOLOGY: No anemia, easy bruising or  bleeding SKIN: No rash or lesion. MUSCULOSKELETAL: No joint pain or arthritis.   NEUROLOGIC: No tingling, numbness, weakness. Loss of balance. PSYCHIATRY: No anxiety or depression.   MEDICATIONS AT HOME:  Prior to Admission medications   Medication Sig Start Date End Date Taking? Authorizing Provider  acetaminophen (TYLENOL) 325 MG tablet Take 2 tablets (650 mg total) by mouth every 6 (six) hours as needed for mild pain (or Fever >/= 101). 06/11/15  Yes Gale Journeyatherine P Walsh, MD  amLODipine (NORVASC) 5 MG tablet Take 5 mg by mouth daily.   Yes Historical Provider, MD  aspirin EC 81 MG tablet Take 81 mg by mouth daily.   Yes Historical Provider, MD  carvedilol (COREG) 3.125 MG tablet Take 3.125 mg by mouth 2 (two) times daily.   Yes Historical Provider, MD  docusate sodium (COLACE) 100 MG capsule Take 1 capsule (100 mg total) by mouth 2 (two) times daily. 06/11/15  Yes Gale Journeyatherine P Walsh, MD  gabapentin (NEURONTIN) 300 MG capsule Take 300 mg by mouth 3 (three) times daily.   Yes Historical Provider, MD  HYDROcodone-acetaminophen (NORCO/VICODIN) 5-325 MG per tablet Take 1-2 tablets by mouth every 4 (four) hours as needed for moderate pain. 06/11/15  Yes Gale Journeyatherine P Walsh, MD  lisinopril (PRINIVIL,ZESTRIL) 10 MG tablet Take 10 mg by mouth daily.   Yes Historical Provider, MD  MEGARED OMEGA-3 KRILL OIL 500 MG CAPS Take 1 capsule by mouth 2 (two) times daily.   Yes Historical Provider, MD  MELATONIN PO Take 1 tablet by mouth at bedtime.   Yes Historical Provider, MD  mometasone (NASONEX) 50 MCG/ACT nasal spray Place 2 sprays into the nose daily as needed. Patient taking differently: Place 2 sprays into the nose daily as needed (for allergies).  08/16/11  Yes Karie Schwalbeichard I Letvak, MD  Multiple Vitamins-Minerals (MULTIVITAMIN PO) Take 1 tablet by mouth daily.   Yes Historical Provider, MD  rosuvastatin (CRESTOR) 5 MG tablet Take 5 mg by mouth daily.   Yes Historical Provider, MD  isosorbide mononitrate (IMDUR) 60  MG 24 hr tablet Take 60 mg by mouth daily.    Historical Provider, MD  nitroGLYCERIN (NITROSTAT) 0.4 MG SL tablet Place 1 tablet (0.4 mg total) under the tongue every 5 (five) minutes as needed for chest pain. Patient not taking: Reported on 07/02/2015 05/15/14   Karie Schwalbeichard I Letvak, MD      PHYSICAL EXAMINATION:   VITAL SIGNS: Blood pressure 164/57, pulse 57, temperature 98 F (36.7 C), temperature source Oral, resp. rate 16, height 5\' 1"  (1.549 m), weight 55.339 kg (  122 lb), SpO2 96 %.  GENERAL:  81 y.o.-year-old patient lying in the bed with no acute distress.  EYES: Pupils equal, round, reactive to light and accommodation. No scleral icterus. Extraocular muscles intact.  HEENT: Head atraumatic, normocephalic. Oropharynx and nasopharynx clear.  NECK:  Supple, no jugular venous distention. No thyroid enlargement, no tenderness.  LUNGS: Normal breath sounds bilaterally, no wheezing, rales,rhonchi or crepitation. No use of accessory muscles of respiration.  CARDIOVASCULAR: S1, S2 normal. No murmurs, rubs, or gallops.  ABDOMEN: Soft, nontender, nondistended. Bowel sounds present. No organomegaly or mass.  EXTREMITIES: No pedal edema, cyanosis, or clubbing. Pain in left hip. NEUROLOGIC: Cranial nerves II through XII are intact. Muscle strength 5/5 in all extremities. Sensation intact. Gait not checked.  PSYCHIATRIC: The patient is alert and oriented x 3.  SKIN: No obvious rash, lesion, or ulcer.   LABORATORY PANEL:   CBC  Recent Labs Lab 07/02/15 0805  WBC 7.8  HGB 12.2  HCT 36.6  PLT 249  MCV 93.5  MCH 31.2  MCHC 33.4  RDW 14.5  LYMPHSABS 1.7  MONOABS 0.7  EOSABS 0.2  BASOSABS 0.0   ------------------------------------------------------------------------------------------------------------------  Chemistries   Recent Labs Lab 07/02/15 0805 07/02/15 0900  NA 142  --   K 5.3* 4.2  CL 108  --   CO2 26  --   GLUCOSE 101*  --   BUN 22*  --   CREATININE 0.82  --    CALCIUM 9.1  --   AST 25  --   ALT 13*  --   ALKPHOS 195*  --   BILITOT 1.1  --    ------------------------------------------------------------------------------------------------------------------ estimated creatinine clearance is 32.3 mL/min (by C-G formula based on Cr of 0.82). ------------------------------------------------------------------------------------------------------------------ No results for input(s): TSH, T4TOTAL, T3FREE, THYROIDAB in the last 72 hours.  Invalid input(s): FREET3   Coagulation profile No results for input(s): INR, PROTIME in the last 168 hours. ------------------------------------------------------------------------------------------------------------------- No results for input(s): DDIMER in the last 72 hours. -------------------------------------------------------------------------------------------------------------------  Cardiac Enzymes  Recent Labs Lab 07/02/15 0805  TROPONINI <0.03   ------------------------------------------------------------------------------------------------------------------ Invalid input(s): POCBNP  ---------------------------------------------------------------------------------------------------------------  Urinalysis    Component Value Date/Time   COLORURINE YELLOW* 07/02/2015 0954   APPEARANCEUR CLEAR* 07/02/2015 0954   LABSPEC 1.004* 07/02/2015 0954   PHURINE 8.0 07/02/2015 0954   GLUCOSEU NEGATIVE 07/02/2015 0954   HGBUR NEGATIVE 07/02/2015 0954   HGBUR small 06/05/2007 1556   BILIRUBINUR NEGATIVE 07/02/2015 0954   KETONESUR NEGATIVE 07/02/2015 0954   PROTEINUR NEGATIVE 07/02/2015 0954   UROBILINOGEN 0.2 06/05/2007 1556   NITRITE POSITIVE* 07/02/2015 0954   LEUKOCYTESUR 2+* 07/02/2015 0954     RADIOLOGY: Ct Head Wo Contrast  07/02/2015   CLINICAL DATA:  Dizziness  EXAM: CT HEAD WITHOUT CONTRAST  TECHNIQUE: Contiguous axial images were obtained from the base of the skull through the vertex  without intravenous contrast.  COMPARISON:  June 09, 2015  FINDINGS: The ventricles are normal in size and configuration for age. There is no intracranial mass, hemorrhage, extra-axial fluid collection, or midline shift. There are prior lacunar infarcts in the left lentiform nucleus as well as at the genu of the right internal capsule. There is mild periventricular small vessel disease in the centra semiovale bilaterally. No acute infarct evident. There is interval resolution of a left frontal scalp hematoma with only a small amount of hematoma remaining in this area. The bony calvarium appears intact. The visualized mastoid air cells are clear.  IMPRESSION: Mild small vessel disease and small basal  ganglia lacunar type infarcts. No acute infarct evident. No hemorrhage or mass effect. Resolving left frontal scalp hematoma.   Electronically Signed   By: Bretta Bang III M.D.   On: 07/02/2015 08:47   Mr Brain Wo Contrast  07/02/2015   CLINICAL DATA:  The patient awoke at 7 a.m. today would dizziness, nausea, and left-sided weakness.  EXAM: MRI HEAD WITHOUT CONTRAST  TECHNIQUE: Multiplanar, multiecho pulse sequences of the brain and surrounding structures were obtained without intravenous contrast.  COMPARISON:  CT head from the same day.  FINDINGS: The diffusion-weighted images demonstrate no evidence for acute or subacute infarction. Remote lacunar infarcts of the left lentiform nucleus are stable. No acute hemorrhage or mass lesion is present. Smaller remote lacunar infarcts of the right lentiform nucleus are noted. Minimal atrophy is within normal limits for age. Moderate periventricular T2 changes are noted bilaterally.  Flow is present in the major intracranial arteries. A right lens replacement is noted. The globes and orbits are otherwise intact. The paranasal sinuses and mastoid air cells are clear.  Previously noted left supraorbital soft tissue swelling is near completely resolved. A small lipoma  within the right supraorbital scalp is stable.  Skullbase is unremarkable. Midline structures are within normal limits.  IMPRESSION: 1. No acute intracranial abnormality. 2. Remote lacunar infarcts of the basal ganglia bilaterally. 3. Moderate diffuse white matter changes. These likely reflect the sequela of chronic microvascular ischemia. 4. Resolving left supraorbital scalp soft tissue swelling.   Electronically Signed   By: Marin Roberts M.D.   On: 07/02/2015 14:26   US Carotid Bilateral  07/02/2015   CLINICAL DATA:  CVA.  EXAM: BILATERAL CAROTID DUPLEX ULTRASOUND  TECHNIQUE: Wallace Cullens scale imaging, color Doppler and duplex ultrasound were performed of bilateral carotid and vertebral arteries in the neck.  COMPARISON:  None.  FINDINGS: Criteria: Quantification of carotid stenosis is based on velocity parameters that correlate the residual internal carotid diameter with NASCET-based stenosis levels, using the diameter of the distal internal carotid lumen as the denominator for stenosis measurement.  The following velocity measurements were obtained:  RIGHT  ICA:  71/15 cm/sec  CCA:  77/14 cm/sec  SYSTOLIC ICA/CCA RATIO:  0.9  DIASTOLIC ICA/CCA RATIO:  1.1  ECA:  77 cm/sec  LEFT  ICA:  88/18 cm/sec  CCA:  76/14 cm/sec  SYSTOLIC ICA/CCA RATIO:  1.2  DIASTOLIC ICA/CCA RATIO:  1.3  ECA:  101 cm/sec  RIGHT CAROTID ARTERY: Mild carotid bifurcation plaque. No flow limiting stenosis.  RIGHT VERTEBRAL ARTERY:  Patent with antegrade flow.  LEFT CAROTID ARTERY: Mild carotid bifurcation plaque. No flow limiting stenosis.  LEFT VERTEBRAL ARTERY:  Patent antegrade flow.  IMPRESSION: 1. Mild bilateral carotid bifurcation plaque. No flow limiting stenosis. Degree of stenosis less than 50%. 2. Vertebral arteries are patent with antegrade flow.   Electronically Signed   By: Maisie Fus  Register   On: 07/02/2015 14:08   Dg Chest Portable 1 View  07/02/2015   CLINICAL DATA:  Dizziness  EXAM: PORTABLE CHEST - 1 VIEW  COMPARISON:   May 23, 2014  FINDINGS: There is no edema or consolidation. Heart is upper normal in size with pulmonary vascularity within normal limits. No adenopathy. No bone lesions.  IMPRESSION: No edema or consolidation.  Stable cardiac prominence.   Electronically Signed   By: Bretta Bang III M.D.   On: 07/02/2015 08:30    IMPRESSION AND PLAN:  * CVA   Tele, MRI, Echo, Carotid doppler.   PT eval, ASA,  Lipid panel.   Neurology consult.  * CAD-    Cont home emds.  * Htn   Cont meds.  * hyperlipidemia   Cont statin.  * recent pelvic fracture   Was in rehab for 2 weeks.  All the records are reviewed and case discussed with ED provider. Management plans discussed with the patient, family and they are in agreement.  CODE STATUS:    Code Status Orders        Start     Ordered   07/02/15 1350  Do not attempt resuscitation (DNR)   Continuous    Question Answer Comment  In the event of cardiac or respiratory ARREST Do not call a "code blue"   In the event of cardiac or respiratory ARREST Do not perform Intubation, CPR, defibrillation or ACLS   In the event of cardiac or respiratory ARREST Use medication by any route, position, wound care, and other measures to relive pain and suffering. May use oxygen, suction and manual treatment of airway obstruction as needed for comfort.      07/02/15 1350       TOTAL TIME TAKING CARE OF THIS PATIENT: 50 minutes.  More than 50% of the visit was spent in counseling/coordination of care: Idamae Schuller M.D on 07/02/2015   Between 7am to 6pm - Pager - [phone]  After 6pm go to [email or web address] - password [email or web address] [facility]  [place] [facility]  Office  [phone]  CC: Primary care physician; Tillman Abide, MD

## 2015-07-02 NOTE — Clinical Social Work Placement (Signed)
   CLINICAL SOCIAL WORK PLACEMENT  NOTE  Date:  07/02/2015  Patient Details  Name: Christina RenshawRuth J [name] MRN: [id number] Date of Birth: 1920-07-08  Clinical Social Work is seeking post-discharge placement for this patient at the Skilled  Nursing Facility level of care (*CSW will initial, date and re-position this form in  chart as items are completed):  Yes   Patient/family provided with [place] Clinical Social Work Department's list of facilities offering this level of care within the geographic area requested by the patient (or if unable, by the patient's family).  Yes   Patient/family informed of their freedom to choose among providers that offer the needed level of care, that participate in Medicare, Medicaid or managed care program needed by the patient, have an available bed and are willing to accept the patient.  Yes   Patient/family informed of [place]'s ownership interest in [facility] and [facility], as well as of the fact that they are under no obligation to receive care at these facilities.  PASRR submitted to EDS on       PASRR number received on       Existing PASRR number confirmed on 07/02/15     FL2 transmitted to all facilities in geographic area requested by pt/family on 07/02/15     FL2 transmitted to all facilities within larger geographic area on       Patient informed that his/her managed care company has contracts with or will negotiate with certain facilities, including the following:            Patient/family informed of bed offers received.  Patient chooses bed at       Physician recommends and patient chooses bed at      Patient to be transferred to   on  .  Patient to be transferred to facility by       Patient family notified on   of transfer.  Name of family member notified:        PHYSICIAN       Additional Comment:    _______________________________________________ Dede QuerySarah Jeniah Kishi, LCSW 07/02/2015, 3:22 PM

## 2015-07-02 NOTE — Telephone Encounter (Signed)
Sorry to hear I will check on her there

## 2015-07-02 NOTE — Progress Notes (Signed)
Dr. Elisabeth PigeonVachhani to change code status to DNR after talking on phone.

## 2015-07-02 NOTE — Progress Notes (Signed)
Home medications given to daughter to take home.

## 2015-07-02 NOTE — Evaluation (Signed)
Physical Therapy Evaluation Patient Details Name: Christina [name] MRN: [id number] DOB: 06-09-1920 Today's Date: 07/02/2015   History of Present Illness  presented to ER secondary dizziness, L lateral lean; admitted for acute CVA.  Head CT, MRI negative for acute change.  Of note, patient with recent hospitalization due to L sacral/pubic fracture (PWB at that time); discharged to [facility] and recently completed/discharged home (7/13).  Clinical Impression  Upon evaluation, patient alert and oriented to basic information; follows all commands and demonstrates fair insight/awareness.  Slightly impulsive, but redirectable. Demonstrates strength and ROM grossly WFL; denies pain.  Minimal/no reports of dizziness throughout session.  Able to complete bed mobility with min assist; sit/stand, basic transfers and gait (220') with RW, cga.  Good cadence and overall gait speed without overt LOB.  Completes 5x sit/stand in 12 seconds with minimal UE support; good stability.  Mild higher-level balance deficits noted (as evidenced by functional reach approx 6"), but patient with fair awareness of limits of stability. Would benefit from skilled PT to address above deficits and promote optimal return to PLOF; Recommend transition to HHPT with 24 hour sup/assist upon discharge from acute hospitalization. Patient/family aware of recommendations and in agreement with plan.     Follow Up Recommendations Home health PT;Supervision/Assistance - 24 hour (family verifies they are able to provide)    Equipment Recommendations       Recommendations for Other Services       Precautions / Restrictions Precautions Precautions: Fall Restrictions Other Position/Activity Restrictions: was previously PWB to L LE (due to pelvic fracture), but patient denies current WBing restrictions      Mobility  Bed Mobility Overal bed mobility: Needs Assistance Bed Mobility: Supine to Sit     Supine to sit: Min assist         Transfers Overall transfer level: Needs assistance   Transfers: Sit to/from Stand Sit to Stand: Min guard         General transfer comment: cuing for hand placement; slightly impulsive  Ambulation/Gait Ambulation/Gait assistance: Min guard Ambulation Distance (Feet): 220 Feet Assistive device: Rolling walker (2 wheeled)       General Gait Details: reciprocal stepping pattern with fair step height/length; good cadence and overall gait speed.  No overt LOB with minimal/no reports of dizziness with mobility.  Stairs            Wheelchair Mobility    Modified Rankin (Stroke Patients Only)       Balance Overall balance assessment: Needs assistance Sitting-balance support: No upper extremity supported;Feet supported Sitting balance-Leahy Scale: Good     Standing balance support: Bilateral upper extremity supported Standing balance-Leahy Scale: Fair                 High Level Balance Comments: standing functional reach, approx 6-7", extended to 8-9" with unilateral UE support; fair awareness of limits of stability   5x sit/stand in 12 seconds, minimal UE support, good stability/control             Pertinent Vitals/Pain Pain Assessment: No/denies pain    Home Living Family/patient expects to be discharged to:: Private residence Living Arrangements: Alone Available Help at Discharge: Family (previously lived with son (recently passed away); family has been coordinating care since) Type of Home: House Home Access: Stairs to enter   Secretary/administrator of Steps: 3 Home Layout: One level Home Equipment: Environmental consultant - 2 wheels;Bedside commode;Tub bench      Prior Function Level of Independence: Independent with assistive device(s)  Comments: Mod indep with RW for household mobility since discharge from STR     Hand Dominance        Extremity/Trunk Assessment   Upper Extremity Assessment: Overall WFL for tasks assessed            Lower Extremity Assessment: Overall WFL for tasks assessed (ROM grossly WFL for basic transfers and mobility; L hip at least 4-/5 (resistance limited due to previous hip injury), otherwise 4+ to 5/5 throughout)      Cervical / Trunk Assessment:  (forward head, rounded shoulders)  Communication   Communication: No difficulties  Cognition Arousal/Alertness: Awake/alert Behavior During Therapy: WFL for tasks assessed/performed Overall Cognitive Status: Within Functional Limits for tasks assessed                      General Comments      Exercises        Assessment/Plan    PT Assessment Patient needs continued PT services  PT Diagnosis Difficulty walking;Generalized weakness   PT Problem List Decreased activity tolerance;Decreased balance;Decreased mobility;Decreased safety awareness;Decreased knowledge of precautions;Decreased knowledge of use of DME;Decreased strength  PT Treatment Interventions DME instruction;Gait training;Stair training;Functional mobility training;Therapeutic activities;Therapeutic exercise;Balance training;Patient/family education   PT Goals (Current goals can be found in the Care Plan section) Acute Rehab PT Goals Patient Stated Goal: "I want to go home if I can" PT Goal Formulation: With patient Time For Goal Achievement: 07/16/15 Potential to Achieve Goals: Good    Frequency Min 2X/week   Barriers to discharge        Co-evaluation               End of Session Equipment Utilized During Treatment: Gait belt Activity Tolerance: Patient tolerated treatment well Patient left: in bed;with call bell/phone within reach;with bed alarm set;with family/visitor present           Time: [id number] PT Time Calculation (min) (ACUTE ONLY): 28 min   Charges:   PT Evaluation $Initial PT Evaluation Tier I: 1 Procedure     PT G Codes:        Gwyn Mehring H. Manson PasseyBrown, PT, DPT, NCS 07/02/2015, 4:28 PM [phone]

## 2015-07-02 NOTE — Telephone Encounter (Signed)
Jean ms Amenta's daughter called to cancel appointment today.  She had to call EMS to take her back [facility].

## 2015-07-02 NOTE — Clinical Social Work Note (Signed)
Clinical Social Work Assessment  Patient Details  Name: Christina [name] MRN: [id number] Date of Birth: September 30, 1920  Date of referral:  07/02/15               Reason for consult:  Facility Placement                Permission sought to share information with:  Family Supports Permission granted to share information::  Yes, Verbal Permission Granted  Name::      Colletta [place], granddaughter)   Housing/Transportation Living arrangements for the past 2 months:  Single Family Home Source of Information:  Patient, Other (Comment Required) Loss adjuster, chartered) Patient Interpreter Needed:  None Criminal Activity/Legal Involvement Pertinent to Current Situation/Hospitalization:  No - Comment as needed Significant Relationships:  Adult Children, Other Family Members Lives with:  Self Do you feel safe going back to the place where you live?  Yes Need for family participation in patient care:  Yes (Comment)  Care giving concerns:  Pt lived with her son until he passed away recently and has had family stay with her.   Social Worker assessment / plan:  CSW met with pt's granddaughter to address consult. Pt was in MRI for first part of assessment. Pt and family is known to CSW from previous admission. From last admission, pt was discharged to [facility] for STR and then recently went home. Pt lived her son, however he passed aware from a stroke on 06/18/2015. CSW provided supportive counseling around this event.   When pt returned from MRI, CSW updated pt and she is agreeable to SNF placement and prefers [facility].   CSW updated [facility] as pt will likely need SNF at discharge. PT eval is pending. FL2 is on the chart for MD signature. CSW sent referral.   CSW will continue to follow.   Employment status:  Retired Nurse, adult PT Recommendations:  Not assessed at this time Information / Referral to community resources:  [place], Other (Comment Required)  ([facility])  Patient/Family's Response to care:  Pt and family are appreciative of CSW support.   Patient/Family's Understanding of and Emotional Response to Diagnosis, Current Treatment, and Prognosis:  Pt and family are agreeable to SNF placement if needed. Pt's family is also exploring options for pt to continue living at home independently.   Emotional Assessment Appearance:  Appears stated age Attitude/Demeanor/Rapport:    Affect (typically observed):  Accepting Orientation:  Oriented to Self, Oriented to Place, Oriented to  Time, Oriented to Situation Alcohol / Substance use:  Never Used Psych involvement (Current and /or in the community):  No (Comment)  Discharge Needs  Concerns to be addressed:  No discharge needs identified Readmission within the last 30 days:  Yes Current discharge risk:  None Barriers to Discharge:  No Barriers Identified   Darden Dates, LCSW 07/02/2015, 2:52 PM

## 2015-07-02 NOTE — Care Management (Signed)
[facility]. She appears to be from home; readmission. RNCM to continue to follow.

## 2015-07-02 NOTE — Progress Notes (Signed)
*  PRELIMINARY RESULTS* Echocardiogram 2D Echocardiogram has been performed.  Christina HousekeeperJerry R [name] 07/02/2015, 12:52 PM

## 2015-07-02 NOTE — ED Notes (Signed)
Pt reports getting up to walk dog, pt became dizzy, pt reports loss of son recently 06/18/2015, pt denies falling, pt alert and oriented times three, grips equal, strength equal in extremities

## 2015-07-02 NOTE — Progress Notes (Signed)
Dr. Elisabeth PigeonVachhani paged to notifiy pt DNR but order in computer is FULL code.

## 2015-07-02 NOTE — ED Provider Notes (Signed)
[facility] Emergency Department Provider Note  ____________________________________________  Time seen: Approximately 8:07 AM  I have reviewed the triage vital signs and the nursing notes.   HISTORY  Chief Complaint Dizziness    HPI Christina VAZGUEZ is a 81 y.o. female coronary artery disease status post stents, hypertension, peripheral vascular disease, left sacral and left pubic fractures sustained one month ago who presents for evaluation of sudden onset dizziness, constant since onset. Patient reports that she awoke in her usual state of health at 3 AM to walk her dog. She went back to sleep and then awoke again at approximately 7 AM and begin a spacing dizziness when she went to the bathroom. She is unable to describe whether not this is lightheadedness or room spinning dizziness. She has had associated nausea. She denies any associated speech difficulty but reports she was "leaning to the left" today. She denies any fall. Currently her dizziness is severe. Movement makes her symptoms worse. No recent illness including no cough, sneezing, runny nose, congestion, vomiting, diarrhea, fevers or chills. She denies any chest or abdominal pain.   Past Medical History  Diagnosis Date  . CAD (coronary artery disease)     a. S/P previous MI;  b. 04/2007 Cath/PCI: Taxus DES' to mid/distal RCA and RPDA as well as Ramus;  c. myoview 8/09: EF 78%, normal perfusion  . Hypertension   . Osteoporosis   . Peripheral vascular disease   . Hyperlipidemia     Intolerant of many statins  . Chronic shoulder pain   . Chronic back pain   . Osteoarthritis   . Neuropathy   . Mild mitral and aortic regurgitation     a. 11/2011 Echo: EF 55-65%, No RWMA, Gr 1 DD, Mild AI/MR.  . Carotid stenosis     Bilateral, mild to moderate    Patient Active Problem List   Diagnosis Date Noted  . Hip fracture 06/09/2015  . Osteoarthritis, multiple sites 03/04/2015  . Advanced directives,  counseling/discussion 09/04/2014  . Mild cognitive impairment 09/04/2014  . Routine general medical examination at a health care facility 08/26/2013  . Carotid stenosis   . Situational stress 02/12/2012  . Hyperlipidemia 12/04/2007  . Essential hypertension 06/04/2007  . Coronary artery disease with unspecified angina pectoris 06/04/2007  . PERIPHERAL VASCULAR DISEASE 06/04/2007  . Osteoporosis 06/04/2007    Past Surgical History  Procedure Laterality Date  . Carotid u/s  10/2006    0-39% bilaterally  . Vesicovaginal fistula closure w/ tah    . Coronary stent placement  2002    MI, [facility]  . Coronary angioplasty  12/2002    Medical rx only  . Breast biopsy      Bilateral  . Nm myoview ltd  11/2004    Stress, negative; NL EF  . Cp admit  09/2005    Stress myoview negative  . Coronary angioplasty  01/2006    LAD/PAD disease  . Adenosine myoview  01/2006    No ischemia, EF 70%  . Cataract extraction  09/2006    Right  . Coronary angioplasty  03/2007    3 vessel disease  . Coronary stent placement  04/2007    RCA/LCX multiple, [facility]  . Femoral hernia repair  05/2007    Incarcerated, right  . Fracture surgery  03/2009    Left leg    Current Outpatient Rx  Name  Route  Sig  Dispense  Refill  . acetaminophen (TYLENOL) 325 MG tablet   Oral  Take 2 tablets (650 mg total) by mouth every 6 (six) hours as needed for mild pain (or Fever >/= 101).   60 tablet   6   . amLODipine (NORVASC) 5 MG tablet   Oral   Take 5 mg by mouth daily.         Marland Kitchen. aspirin EC 81 MG tablet   Oral   Take 81 mg by mouth daily.         . carvedilol (COREG) 3.125 MG tablet   Oral   Take 3.125 mg by mouth 2 (two) times daily.         Marland Kitchen. docusate sodium (COLACE) 100 MG capsule   Oral   Take 1 capsule (100 mg total) by mouth 2 (two) times daily.   10 capsule   0   . gabapentin (NEURONTIN) 300 MG capsule   Oral   Take 300 mg by mouth 3 (three) times daily.         Marland Kitchen.  HYDROcodone-acetaminophen (NORCO/VICODIN) 5-325 MG per tablet   Oral   Take 1-2 tablets by mouth every 4 (four) hours as needed for moderate pain.   30 tablet   0   . isosorbide mononitrate (IMDUR) 60 MG 24 hr tablet   Oral   Take 60 mg by mouth daily.         Marland Kitchen. MEGARED OMEGA-3 KRILL OIL 500 MG CAPS   Oral   Take 1 capsule by mouth 2 (two) times daily.         Marland Kitchen. MELATONIN PO   Oral   Take 1 tablet by mouth at bedtime.         . mometasone (NASONEX) 50 MCG/ACT nasal spray   Nasal   Place 2 sprays into the nose daily as needed. Patient taking differently: Place 2 sprays into the nose daily as needed (for allergies).    17 g   11   . Multiple Vitamins-Minerals (MULTIVITAMIN PO)   Oral   Take 1 tablet by mouth daily.         . nitroGLYCERIN (NITROSTAT) 0.4 MG SL tablet   Sublingual   Place 1 tablet (0.4 mg total) under the tongue every 5 (five) minutes as needed for chest pain.   25 tablet   3   . rosuvastatin (CRESTOR) 5 MG tablet   Oral   Take 5 mg by mouth daily.           Allergies Atorvastatin; Diltiazem hcl; Dye fdc red; Gemfibrozil; Niacin; and Sulfasalazine  Family History  Problem Relation Age of Onset  . Pulmonary embolism Mother   . Heart disease Father   . Heart disease Sister   . Cancer Brother   . Heart disease Sister     Social History History  Substance Use Topics  . Smoking status: Never Smoker   . Smokeless tobacco: Never Used  . Alcohol Use: No    Review of Systems Constitutional: No fever/chills Eyes: No visual changes. ENT: No sore throat. Cardiovascular: Denies chest pain. Respiratory: Denies shortness of breath. Gastrointestinal: No abdominal pain.  + nausea, no vomiting.  No diarrhea.  No constipation. Genitourinary: Negative for dysuria. Musculoskeletal: Negative for back pain. Skin: Negative for rash. Neurological: Negative for headaches, focal weakness or numbness.  10-point ROS otherwise  negative.  ____________________________________________   PHYSICAL EXAM:  VITAL SIGNS: ED Triage Vitals  Enc Vitals Group     BP 07/02/15 0759 165/58 mmHg     Pulse Rate 07/02/15 0759 67  Resp --      Temp 07/02/15 0759 98 F (36.7 C)     Temp Source 07/02/15 0759 Oral     SpO2 07/02/15 0759 98 %     Weight 07/02/15 0759 132 lb (59.875 kg)     Height 07/02/15 0759 5\' 1"  (1.549 m)     Head Cir --      Peak Flow --      Pain Score --      Pain Loc --      Pain Edu? --      Excl. in GC? --     Constitutional: Alert and oriented. Appears nauseated. Eyes: Conjunctivae are normal. PERRL. EOMI. Head: Atraumatic. Nose: No congestion/rhinnorhea. Mouth/Throat: Mucous membranes are moist.  Oropharynx non-erythematous. Neck: No stridor.   Cardiovascular: Normal rate, regular rhythm. Grossly normal heart sounds.  Good peripheral circulation. Respiratory: Normal respiratory effort.  No retractions. Lungs CTAB. Gastrointestinal: Soft and nontender. No distention. No abdominal bruits. No CVA tenderness. Genitourinary: deferred Musculoskeletal: No lower extremity tenderness nor edema.  No joint effusions. Neurologic:  Normal speech and language. No gross focal neurologic deficits are appreciated. 5 out of 5 strength in the distal muscle groups of all extremities. Strength is limited in the left limb girdle/proximal muscle groups secondary to pain/recent fractures but distally is 5 out of 5. Sensation is intact to light touch throughout. No dysmetria on finger-nose-finger however the exercise does reproduce dizziness. Cranial nerves II through XII intact. Skin:  Skin is warm, dry and intact. No rash noted. Psychiatric: Mood and affect are normal. Speech and behavior are normal.  ____________________________________________   LABS (all labs ordered are listed, but only abnormal results are displayed)  Labs Reviewed  COMPREHENSIVE METABOLIC PANEL - Abnormal; Notable for the following:     Potassium 5.3 (*)    Glucose, Bld 101 (*)    BUN 22 (*)    Albumin 3.3 (*)    ALT 13 (*)    Alkaline Phosphatase 195 (*)    GFR calc non Af Amer 60 (*)    All other components within normal limits  CBC WITH DIFFERENTIAL/PLATELET  TROPONIN I  POTASSIUM  URINALYSIS COMPLETEWITH MICROSCOPIC ([facility] ONLY)   ____________________________________________  EKG  ED ECG REPORT I, Gayla Doss, the attending physician, personally viewed and interpreted this ECG.   Date: 07/02/2015  EKG Time: 08:02  Rate: 57  Rhythm: junctional rhythm  Axis: normal  Intervals: All intervals are normal with the exception of absent PR interval given junctional rhythm  ST&T Change: No acute ST segment elevation, Q waves in lead 3 and aVF  ____________________________________________  RADIOLOGY  CXR IMPRESSION: No edema or consolidation. Stable cardiac prominence.    CT head IMPRESSION: Mild small vessel disease and small basal ganglia lacunar type infarcts. No acute infarct evident. No hemorrhage or mass effect. Resolving left frontal scalp hematoma. ____________________________________________   PROCEDURES  Procedure(s) performed: None  Critical Care performed: Yes, see critical care note(s). Total critical care time spent 35 minutes.  ____________________________________________   INITIAL IMPRESSION / ASSESSMENT AND PLAN / ED COURSE  Pertinent labs & imaging results that were available during my care of the patient were reviewed by me and considered in my medical decision making (see chart for details).  Christina [name] is a 81 y.o. female coronary artery disease status post stents, hypertension, peripheral vascular disease, left sacral and left pubic fractures sustained one month ago who presents for evaluation of sudden onset dizziness. On exam, she  is nauseated with dizziness that is exacerbated by dysmetria testing/finger-nose-finger. She has no objective weakness, no sensory  deficit, no speech difficulty. Plan for symptomatically controlled for possible vertigo, CT head, screening chest x-ray labs and UA. Reassess for disposition.  ----------------------------------------- 9:20 AM on 07/02/2015 -----------------------------------------  Patient with continued dizziness which has improved but not completely resolved. CT head is notable for lacunar infarcts which were not previously mentioned on CT head on 06/09/2015. Currently, NIH stroke scale is 2 Due to drift in the left lower extremity which hit the bed however it is unclear to me whether not this is related to new weakness/acute CVA or, her healing/resolving left-sided pelvic fractures. Given how mild her symptoms are, will not give TPA at this time. We'll give aspirin. Discussed with hospitalist for admission given concern for acute CVA, subacute CVAs noted on CT head. Potassium 4.2 on recheck, suspect hemolyzed specimen causing falsely elevated potassium on initial check. ____________________________________________   FINAL CLINICAL IMPRESSION(S) / ED DIAGNOSES  Final diagnoses:  Dizziness  CVA (cerebral vascular accident)      Gayla Doss, MD 07/02/15 [phone]

## 2015-07-02 NOTE — Plan of Care (Signed)
Problem: Discharge/Transitional Outcomes Goal: Barriers To Progression Addressed/Resolved Individualization: Pt prefers to be called [name] who lives at home alone. Son who was primary caregiver recently passed away July 1.  Hx HTN, CAD, PVD, Osteoporosis, Neuropathy, chronic back pain controlled by home medications.  High fall risk w/ recent fall at home. Bed alarm on, hourly rounding w/ toileting offered. Pt understand how to use call system    Goal: Other Discharge Outcomes/Goals 1. No c/o pain  2. Hemodynamically:             -VSS, afebrile             -A/O, no deficits              -Neuro checks Q4H  3. Tolerating diet well but small appetite  4. Family very supportive, SW involved w/ planning discharge needs

## 2015-07-03 LAB — CBC
HCT: 35.4 % (ref 35.0–47.0)
HEMOGLOBIN: 11.5 g/dL — AB (ref 12.0–16.0)
MCH: 30.4 pg (ref 26.0–34.0)
MCHC: 32.6 g/dL (ref 32.0–36.0)
MCV: 93.4 fL (ref 80.0–100.0)
Platelets: 225 10*3/uL (ref 150–440)
RBC: 3.79 MIL/uL — AB (ref 3.80–5.20)
RDW: 14.5 % (ref 11.5–14.5)
WBC: 6.9 10*3/uL (ref 3.6–11.0)

## 2015-07-03 LAB — BASIC METABOLIC PANEL
Anion gap: 6 (ref 5–15)
BUN: 19 mg/dL (ref 6–20)
CO2: 27 mmol/L (ref 22–32)
Calcium: 8.8 mg/dL — ABNORMAL LOW (ref 8.9–10.3)
Chloride: 111 mmol/L (ref 101–111)
Creatinine, Ser: 0.75 mg/dL (ref 0.44–1.00)
GFR calc Af Amer: >60 mL/min (ref >60)
GLUCOSE: 94 mg/dL (ref 65–99)
Potassium: 4 mmol/L (ref 3.5–5.1)
Sodium: 144 mmol/L (ref 135–145)

## 2015-07-03 NOTE — Plan of Care (Addendum)
Problem: Discharge/Transitional Outcomes Goal: Barriers To Progression Addressed/Resolved Individualization:  Outcome: Adequate for Discharge Individualization of Care   Pt prefers to be called [name] who lives at home alone. Son who was primary caregiver recently passed away July 1st. Patient tearful at times when discussing son.  She has a plan in place with her daughter for a new caregiver at home after discharge.   Patient has a history of HTN, CAD, PVD, Osteoporosis, Neuropathy, chronic back pain controlled by home medications.  High fall risk w/ recent fall at home. Bed alarm on, hourly rounding w/ toileting offered. Pt compliant with using call bell to address needs.   Goal: Other Discharge Outcomes/Goals 1. No c/o pain  2. Hemodynamically:  -VSS, afebrile BP 155/64 mmHg  Pulse 56  Temp(Src) 98 F (36.7 C) (Oral)  Resp 19  Ht 5\' 1"  (1.549 m)  Wt 122 lb (55.339 kg)  BMI 23.06 kg/m2  SpO2 98%  -A/O, no deficits   -Neuro checks Q4H  3. Tolerating diet well but small appetite  4. Family very supportive, SW involved w/ planning discharge needs

## 2015-07-03 NOTE — Progress Notes (Signed)
Pt being discharged home with home health, stroke education complete. IV D/C.  Discharge instructions reviewed with pt and family, pt and family verbalized understanding.

## 2015-07-03 NOTE — Discharge Summary (Signed)
Christina [name], 81 y.o., DOB October 08, 1920, MRN [id number]. Admission date: 07/02/2015 Discharge Date 07/03/2015 Primary MD Tillman Abide, MD Admitting Physician Altamese Dilling, MD  Admission Diagnosis  Dizziness [R42] CVA (cerebral vascular accident) [I63.9] CVA (cerebral infarction) [I63.9]  Discharge Diagnosis   Active Problems:   Dizziness  Old lacunar infarct no new stroke Coronary artery disease with history of MI 04/2007 Cath/PCI: Taxus DES' to mid/distal RCA and RPDA as well as Ramus; c. myoview 8/09: EF 78%, normal perfusion Hypertension Osteoporosis Peripheral vascular disease Hyperlipidemia Chronic shoulder pain Chronic back pain Osteoarthritis Neuropathy Mild mitral and aortic regurg Bilateral mild to moderate carotid stenosis      Hospital Course Christina [name] is a 81 y.o. female with a known history of coronary artery disease, hypertension, osteoporosis, peripheral vascular disease, hyperlipidemia, recent pelvic fracture- after that she was sent to rehabilitation and went home 3 days ago, was doing fine. Patient had difficulty with dizziness and felt like she was not better be able to walk. Was brought to the emergency room CT scan of the head showed a concern for a possible CVA therefore she was admitted for stroke. She had  MRI of the brain which showed remote lacunar infarct no acute CVA was noted. She was seen by physical therapy recommended the home PT. At this point she is doing better and home physical therapy has been arranged.            Consults  None  Significant Tests:  See full reports for all details    Ct Head Wo Contrast  07/02/2015   CLINICAL DATA:  Dizziness  EXAM: CT HEAD WITHOUT CONTRAST  TECHNIQUE: Contiguous axial images were obtained from the base of the skull through the vertex without intravenous contrast.  COMPARISON:  June 09, 2015  FINDINGS: The ventricles are normal in size and configuration for age. There is no intracranial mass,  hemorrhage, extra-axial fluid collection, or midline shift. There are prior lacunar infarcts in the left lentiform nucleus as well as at the genu of the right internal capsule. There is mild periventricular small vessel disease in the centra semiovale bilaterally. No acute infarct evident. There is interval resolution of a left frontal scalp hematoma with only a small amount of hematoma remaining in this area. The bony calvarium appears intact. The visualized mastoid air cells are clear.  IMPRESSION: Mild small vessel disease and small basal ganglia lacunar type infarcts. No acute infarct evident. No hemorrhage or mass effect. Resolving left frontal scalp hematoma.   Electronically Signed   By: Bretta Bang III M.D.   On: 07/02/2015 08:47   Ct Head Wo Contrast  06/09/2015   CLINICAL DATA:  Slipped in bathtub this morning, hit head. Hematoma the left forehead.  EXAM: CT HEAD WITHOUT CONTRAST  TECHNIQUE: Contiguous axial images were obtained from the base of the skull through the vertex without intravenous contrast.  COMPARISON:  None.  FINDINGS: Left forehead soft tissue hematoma. No underlying calvarial abnormality. No acute intracranial abnormality. Specifically, no hemorrhage, hydrocephalus, mass lesion, acute infarction, or significant intracranial injury. No acute calvarial abnormality. Visualized paranasal sinuses and mastoids clear. Orbital soft tissues unremarkable.  IMPRESSION: No acute intracranial abnormality.   Electronically Signed   By: Charlett Nose M.D.   On: 06/09/2015 12:34   Mr Brain Wo Contrast  07/02/2015   CLINICAL DATA:  The patient awoke at 7 a.m. today would dizziness, nausea, and left-sided weakness.  EXAM: MRI HEAD WITHOUT CONTRAST  TECHNIQUE: Multiplanar, multiecho pulse sequences  of the brain and surrounding structures were obtained without intravenous contrast.  COMPARISON:  CT head from the same day.  FINDINGS: The diffusion-weighted images demonstrate no evidence for acute or  subacute infarction. Remote lacunar infarcts of the left lentiform nucleus are stable. No acute hemorrhage or mass lesion is present. Smaller remote lacunar infarcts of the right lentiform nucleus are noted. Minimal atrophy is within normal limits for age. Moderate periventricular T2 changes are noted bilaterally.  Flow is present in the major intracranial arteries. A right lens replacement is noted. The globes and orbits are otherwise intact. The paranasal sinuses and mastoid air cells are clear.  Previously noted left supraorbital soft tissue swelling is near completely resolved. A small lipoma within the right supraorbital scalp is stable.  Skullbase is unremarkable. Midline structures are within normal limits.  IMPRESSION: 1. No acute intracranial abnormality. 2. Remote lacunar infarcts of the basal ganglia bilaterally. 3. Moderate diffuse white matter changes. These likely reflect the sequela of chronic microvascular ischemia. 4. Resolving left supraorbital scalp soft tissue swelling.   Electronically Signed   By: Marin Roberts M.D.   On: 07/02/2015 14:26   Mr Hip Left Wo Contrast  06/09/2015   CLINICAL DATA:  Status post fall in the bathtub this morning with a twisting injury of the left hip. Pain with weight-bearing. Initial encounter.  EXAM: MR OF THE LEFT HIP WITHOUT CONTRAST  TECHNIQUE: Multiplanar, multisequence MR imaging was performed. No intravenous contrast was administered.  COMPARISON:  Plain films of the left hip this same day.  FINDINGS: Bones: The patient has a nondisplaced fracture of the left sacrum with associated marrow edema. Left parasymphyseal pubic bone fracture is also identified. No other fracture is seen. Specifically, there is no hip fracture. The femoral heads are located.  Articular cartilage and labrum  Articular cartilage:  Mildly degenerated.  Labrum:  Mildly degenerated.  Joint or bursal effusion  Joint effusion:  None.  Bursae: There is a small amount of fluid in the  left trochanteric bursa compatible with bursitis.  Muscles and tendons  Muscles and tendons:  Intact.  Other findings  Miscellaneous: Extensive contusion and hematoma are seen in the cutaneous and subcutaneous fatty tissues about the left hip. The urinary bladder appears somewhat distended. Extensive sigmoid diverticular disease identified without diverticulitis.  IMPRESSION: Acute left sacral and left parasymphyseal pubic bone fractures.  Negative for hip fracture.  Extensive cutaneous and subcutaneous contusion hematoma above the left greater trochanter.  Small amount fluid in the left trochanteric bursa is compatible with bursitis, possibly posttraumatic.   Electronically Signed   By: Drusilla Kanner M.D.   On: 06/09/2015 15:26   US Carotid Bilateral  07/02/2015   CLINICAL DATA:  CVA.  EXAM: BILATERAL CAROTID DUPLEX ULTRASOUND  TECHNIQUE: Wallace Cullens scale imaging, color Doppler and duplex ultrasound were performed of bilateral carotid and vertebral arteries in the neck.  COMPARISON:  None.  FINDINGS: Criteria: Quantification of carotid stenosis is based on velocity parameters that correlate the residual internal carotid diameter with NASCET-based stenosis levels, using the diameter of the distal internal carotid lumen as the denominator for stenosis measurement.  The following velocity measurements were obtained:  RIGHT  ICA:  71/15 cm/sec  CCA:  77/14 cm/sec  SYSTOLIC ICA/CCA RATIO:  0.9  DIASTOLIC ICA/CCA RATIO:  1.1  ECA:  77 cm/sec  LEFT  ICA:  88/18 cm/sec  CCA:  76/14 cm/sec  SYSTOLIC ICA/CCA RATIO:  1.2  DIASTOLIC ICA/CCA RATIO:  1.3  ECA:  101  cm/sec  RIGHT CAROTID ARTERY: Mild carotid bifurcation plaque. No flow limiting stenosis.  RIGHT VERTEBRAL ARTERY:  Patent with antegrade flow.  LEFT CAROTID ARTERY: Mild carotid bifurcation plaque. No flow limiting stenosis.  LEFT VERTEBRAL ARTERY:  Patent antegrade flow.  IMPRESSION: 1. Mild bilateral carotid bifurcation plaque. No flow limiting stenosis. Degree of  stenosis less than 50%. 2. Vertebral arteries are patent with antegrade flow.   Electronically Signed   By: Maisie Fus  Register   On: 07/02/2015 14:08   Dg Chest Portable 1 View  07/02/2015   CLINICAL DATA:  Dizziness  EXAM: PORTABLE CHEST - 1 VIEW  COMPARISON:  May 23, 2014  FINDINGS: There is no edema or consolidation. Heart is upper normal in size with pulmonary vascularity within normal limits. No adenopathy. No bone lesions.  IMPRESSION: No edema or consolidation.  Stable cardiac prominence.   Electronically Signed   By: Bretta Bang III M.D.   On: 07/02/2015 08:30   Dg Hip Unilat With Pelvis 2-3 Views Left  06/09/2015   CLINICAL DATA:  Initial encounter for fall getting out of tub today. Landed on left hip. Pain.  EXAM: LEFT HIP (WITH PELVIS) 2-3 VIEWS  COMPARISON:  None.  FINDINGS: Frontal pelvis shows diffuse bony demineralization without evidence for sacral fracture. No evidence for pubic ramus fracture.  AP and frog-leg lateral views of the right hip show no femoral neck fracture.  IMPRESSION: Negative.   Electronically Signed   By: Kennith Center M.D.   On: 06/09/2015 12:29       Today   Subjective:   Christina [name]  feels well denies any significant complaints   Objective:   Blood pressure 155/64, pulse 56, temperature 98 F (36.7 C), temperature source Oral, resp. rate 19, height 5\' 1"  (1.549 m), weight 55.339 kg (122 lb), SpO2 98 %.  .  Intake/Output Summary (Last 24 hours) at 07/03/15 1136 Last data filed at 07/03/15 0837  Gross per 24 hour  Intake    240 ml  Output   1175 ml  Net   -935 ml    Exam VITAL SIGNS: Blood pressure 155/64, pulse 56, temperature 98 F (36.7 C), temperature source Oral, resp. rate 19, height 5\' 1"  (1.549 m), weight 55.339 kg (122 lb), SpO2 98 %.  GENERAL:  81 y.o.-year-old patient lying in the bed with no acute distress.  EYES: Pupils equal, round, reactive to light and accommodation. No scleral icterus. Extraocular muscles intact.  HEENT:  Head atraumatic, normocephalic. Oropharynx and nasopharynx clear.  NECK:  Supple, no jugular venous distention. No thyroid enlargement, no tenderness.  LUNGS: Normal breath sounds bilaterally, no wheezing, rales,rhonchi or crepitation. No use of accessory muscles of respiration.  CARDIOVASCULAR: S1, S2 normal. No murmurs, rubs, or gallops.  ABDOMEN: Soft, nontender, nondistended. Bowel sounds present. No organomegaly or mass.  EXTREMITIES: No pedal edema, cyanosis, or clubbing.  NEUROLOGIC: Cranial nerves II through XII are intact. Muscle strength 5/5 in all extremities. Sensation intact. Gait not checked.  PSYCHIATRIC: The patient is alert and oriented x 3.  SKIN: No obvious rash, lesion, or ulcer.   Data Review     CBC w Diff: Lab Results  Component Value Date   WBC 6.9 07/03/2015   WBC 7.0 05/23/2014   HGB 11.5* 07/03/2015   HGB 12.7 05/23/2014   HCT 35.4 07/03/2015   HCT 39.0 05/23/2014   PLT 225 07/03/2015   PLT 200 05/23/2014   LYMPHOPCT 22 07/02/2015   MONOPCT 10 07/02/2015   EOSPCT  3 07/02/2015   BASOPCT 1 07/02/2015   CMP: Lab Results  Component Value Date   NA 144 07/03/2015   NA 141 05/23/2014   K 4.0 07/03/2015   K 3.8 05/23/2014   CL 111 07/03/2015   CL 109* 05/23/2014   CO2 27 07/03/2015   CO2 26 05/23/2014   BUN 19 07/03/2015   BUN 26* 05/23/2014   CREATININE 0.75 07/03/2015   CREATININE 1.02 05/23/2014   CREATININE 0.98 06/07/2011   PROT 7.2 07/02/2015   ALBUMIN 3.3* 07/02/2015   BILITOT 1.1 07/02/2015   ALKPHOS 195* 07/02/2015   AST 25 07/02/2015   ALT 13* 07/02/2015  .  Micro Results No results found for this or any previous visit (from the past 240 hour(s)).      Code Status Orders        Start     Ordered   07/02/15 1350  Do not attempt resuscitation (DNR)   Continuous    Question Answer Comment  In the event of cardiac or respiratory ARREST Do not call a "code blue"   In the event of cardiac or respiratory ARREST Do not perform  Intubation, CPR, defibrillation or ACLS   In the event of cardiac or respiratory ARREST Use medication by any route, position, wound care, and other measures to relive pain and suffering. May use oxygen, suction and manual treatment of airway obstruction as needed for comfort.      07/02/15 1350          Follow-up Information    Follow up with Tillman Abideichard Letvak, MD In 7 days.   Specialties:  Internal Medicine, Pediatrics   Contact information:   [address] [place] [address] [phone]       Discharge Medications     Medication List    STOP taking these medications        docusate sodium 100 MG capsule  Commonly known as:  COLACE     HYDROcodone-acetaminophen 5-325 MG per tablet  Commonly known as:  NORCO/VICODIN      TAKE these medications        acetaminophen 325 MG tablet  Commonly known as:  TYLENOL  Take 2 tablets (650 mg total) by mouth every 6 (six) hours as needed for mild pain (or Fever >/= 101).     amLODipine 5 MG tablet  Commonly known as:  NORVASC  Take 5 mg by mouth daily.     aspirin EC 81 MG tablet  Take 81 mg by mouth daily.     carvedilol 3.125 MG tablet  Commonly known as:  COREG  Take 3.125 mg by mouth 2 (two) times daily.     gabapentin 300 MG capsule  Commonly known as:  NEURONTIN  Take 300 mg by mouth 3 (three) times daily.     isosorbide mononitrate 60 MG 24 hr tablet  Commonly known as:  IMDUR  Take 60 mg by mouth daily.     lisinopril 10 MG tablet  Commonly known as:  PRINIVIL,ZESTRIL  Take 10 mg by mouth daily.     MEGARED OMEGA-3 KRILL OIL 500 MG Caps  Take 1 capsule by mouth 2 (two) times daily.     MELATONIN PO  Take 1 tablet by mouth at bedtime.     mometasone 50 MCG/ACT nasal spray  Commonly known as:  NASONEX  Place 2 sprays into the nose daily as needed.     MULTIVITAMIN PO  Take 1 tablet by mouth daily.  nitroGLYCERIN 0.4 MG SL tablet  Commonly known as:  NITROSTAT  Place 1 tablet (0.4  mg total) under the tongue every 5 (five) minutes as needed for chest pain.     rosuvastatin 5 MG tablet  Commonly known as:  CRESTOR  Take 5 mg by mouth daily.           Total Time in preparing paper work, data evaluation and todays exam - 35 minutes  Auburn Bilberry M.D on 07/03/2015 at 11:36 AM  [facility]   Office  [phone]

## 2015-07-03 NOTE — Discharge Instructions (Signed)
DIET:  Cardiac diet  DISCHARGE CONDITION:  Good  ACTIVITY:  Activity as tolerated  OXYGEN:  Home Oxygen: No.   Oxygen Delivery: room air  DISCHARGE LOCATION:  Home with home health and pt    ADDITIONAL DISCHARGE INSTRUCTION:   If you experience worsening of your admission symptoms, develop shortness of breath, life threatening emergency, suicidal or homicidal thoughts you must seek medical attention immediately by calling 911 or calling your MD immediately  if symptoms less severe.  You Must read complete instructions/literature along with all the possible adverse reactions/side effects for all the Medicines you take and that have been prescribed to you. Take any new Medicines after you have completely understood and accpet all the possible adverse reactions/side effects.   Please note  You were cared for by a hospitalist during your hospital stay. If you have any questions about your discharge medications or the care you received while you were in the hospital after you are discharged, you can call the unit and asked to speak with the hospitalist on call if the hospitalist that took care of you is not available. Once you are discharged, your primary care physician will handle any further medical issues. Please note that NO REFILLS for any discharge medications will be authorized once you are discharged, as it is imperative that you return to your primary care physician (or establish a relationship with a primary care physician if you do not have one) for your aftercare needs so that they can reassess your need for medications and monitor your lab values.   Ischemic Stroke Blood carries oxygen to all areas of your body. A stroke happens when your blood does not flow to your brain like normal. If this happens, your brain will not get the oxygen it needs and brain tissue will die. This is an emergency. Problems (symptoms) of a stroke usually happen suddenly. You may notice them when  you wake up. They can include:  Loss of feeling or weakness on one side of the body (face, arm, leg).  Feeling confused.  Trouble talking or understanding.  Trouble seeing.  Trouble walking.  Feeling dizzy.  Loss of balance or coordination.  Severe headache without a cause.  Trouble reading or writing. Get help as soon as any of these problems first start. This is important.  RISK FACTORS  Risk factors are things that make it more likely for you to have a stroke. These things include:  High blood pressure (hypertension).  High cholesterol.  Diabetes.  Heart disease.  Having a buildup of fatty deposits in the blood vessels.  Having an abnormal heart rhythm (atrial fibrillation).  Being very overweight (obese).  Smoking.  Taking birth control pills, especially if you smoke.  Not being active.  Having a diet high in fats, salt, and calories.  Drinking too much alcohol.  Using illegal drugs.  Being African American.  Being over the age of 955.  Having a family history of stroke.  Having a history of blood clots, stroke, warning stroke (transient ischemic attack, TIA), or heart attack.  Sickle cell disease. HOME CARE  Take all medicines exactly as told by your doctor. Understand all your medicine instructions.  You may need to take a medicine to thin your blood, like aspirin or warfarin. Take warfarin exactly as told.  Taking too much or too little warfarin is dangerous. Get regular blood tests as told, including the PT and INR tests. The test results help your doctor adjust your dose of  warfarin. Your PT and INR levels must be done as often as told by your doctor.  Food can cause problems with warfarin and affect the results of your blood tests. This is true for foods high in vitamin K, such as spinach, kale, broccoli, cabbage, collard and turnip greens, Brussels sprouts, peas, cauliflower, seaweed, and parsley, as well as beef and pork liver, green tea,  and soybean oil. Eat the same amount of food high in vitamin K. Avoid major changes in your diet. Tell your doctor before changing your diet. Talk to a food specialist (dietitian) if you have questions.  Many medicines can cause problems with warfarin and affect your PT and INR test results. Tell your doctor about all medicines you take. This includes vitamins and dietary pills (supplements). Be careful with aspirin and medicines that relieve redness, soreness, and puffiness (inflammation). Do not take or stop medicines unless your doctor tells you to.  Warfarin can cause a lot of bruising or bleeding. Hold pressure over cuts for longer than normal. Talk to your doctor about other side effects of warfarin.  Avoid sports or activities that may cause injury or bleeding.  Be careful when you shave, floss your teeth, or use sharp objects.  Avoid alcoholic drinks or drink very little alcohol while taking warfarin. Tell your doctor if you change how much alcohol you drink.  Tell your dentist and other doctors that you take warfarin before procedures.  If you are able to swallow, eat healthy foods. Eat 5 or more servings of fruits and vegetables a day. Eat soft foods, pureed foods, or eat small bites of food so you do not choke.  Follow your diet program as told, if you are given one.  Keep a healthy weight.  Stay active. Try to get at least 30 minutes of activity on most or all days.  Do not smoke.  Limit how much alcohol you drink even if you are not taking warfarin. Moderate alcohol use is:  No more than 2 drinks each day for men.  No more than 1 drink each day for women who are not pregnant.  Keep your home safe so you do not fall. Try:  Putting grab bars in the bedroom and bathroom.  Raising toilet seats.  Putting a seat in the shower.  Go to therapy sessions (physical, occupational, and speech) as told by your doctor.  Use a walker or cane at all times if told to do so.  Keep  all doctor visits as told. GET HELP IF:  Your personality changes.  You have trouble swallowing.  You are seeing two of everything.  You are dizzy.  You have a fever.  Your skin starts to break down. GET HELP RIGHT AWAY IF:  The symptoms below may be a sign of an emergency. Do not wait to see if the symptoms go away. Call for help (911 in [place] Do not drive yourself to the hospital.  You have sudden weakness or numbness on the face, arm, or leg (especially on one side of the body).  You have sudden trouble walking or moving your arms or legs.  You have sudden confusion.  You have trouble talking or understanding.  You have sudden trouble seeing in one or both eyes.  You lose your balance or your movements are not smooth.  You have a sudden, severe headache with no known cause.  You have new chest pain or you feel your heart beating in an unsteady way.  You  are partly or totally unaware of what is going on around you. Document Released: 11/23/2011 Document Revised: 04/20/2014 Document Reviewed: 07/14/2012 [facility] Patient Information 2015 [place], [place]. This information is not intended to replace advice given to you by your health care provider. Make sure you discuss any questions you have with your health care provider.  Stroke Prevention Some health problems and behaviors may make it more likely for you to have a stroke. Below are ways to lessen your risk of having a stroke.   Be active for at least 30 minutes on most or all days.  Do not smoke. Try not to be around others who smoke.  Do not drink too much alcohol.  Do not have more than 2 drinks a day if you are a man.  Do not have more than 1 drink a day if you are a woman and are not pregnant.  Eat healthy foods, such as fruits and vegetables. If you were put on a specific diet, follow the diet as told.  Keep your cholesterol levels under control through diet and medicines. Look for foods that are low in  saturated fat, trans fat, cholesterol, and are high in fiber.  If you have diabetes, follow all diet plans and take your medicine as told.  If you have high blood pressure (hypertension), follow all diet plans and take your medicine as told.  Keep a healthy weight. Eat foods that are low in calories, salt, saturated fat, trans fat, and cholesterol.  Do not take drugs.  Avoid birth control pills, if this applies. Talk to your doctor about the risks of taking birth control pills.  Talk to your doctor if you have sleep problems (sleep apnea).  Take all medicine as told by your doctor.  You may be told to take aspirin or blood thinner medicine. Take this medicine as told by your doctor.  Understand your medicine instructions.  Make sure any other conditions you have are being taken care of. GET HELP RIGHT AWAY IF:  You suddenly lose feeling (you feel numb) or have weakness in your face, arm, or leg.  Your face or eyelid hangs down to one side.  You suddenly feel confused.  You have trouble talking (aphasia) or understanding what people are saying.  You suddenly have trouble seeing in one or both eyes.  You suddenly have trouble walking.  You are dizzy.  You lose your balance or your movements are clumsy (uncoordinated).  You suddenly have a very bad headache and you do not know the cause.  You have new chest pain.  Your heart feels like it is fluttering or skipping a beat (irregular heartbeat). Do not wait to see if the symptoms above go away. Get help right away. Call your local emergency services (911 in [place] Do not drive yourself to the hospital. Document Released: 06/04/2012 Document Revised: 04/20/2014 Document Reviewed: 06/06/2013 [facility] Patient Information 2015 [place], [place]. This information is not intended to replace advice given to you by your health care provider. Make sure you discuss any questions you have with your health care provider.

## 2015-07-03 NOTE — Care Management Note (Signed)
Case Management Note  Patient Details  Name: Christina RenshawRuth J [name] MRN: [id number] Date of Birth: 1920/11/06  Subjective/Objective:     Spoke with daughter Rubbie BattiestJean [name] WU:[phone], who chose [facility] as Christina Strohecker's home health provider for PT, RN, Aid. [name] states that she has arranged for a private 24/7 aid to stay with [name] starting today. A referral has been faxed and called to [facility] with discharge orders. Christina [name] has a rolling walker, wheelchair, bedside commode, bath chair, at home.                Action/Plan:   Expected Discharge Date:                  Expected Discharge Plan:     In-House Referral:     Discharge planning Services     Post Acute Care Choice:    Choice offered to:     DME Arranged:    DME Agency:     HH Arranged:    HH Agency:     Status of Service:     Medicare Important Message Given:    Date Medicare IM Given:    Medicare IM give by:    Date Additional Medicare IM Given:    Additional Medicare Important Message give by:     If discussed at Long Length of Stay Meetings, dates discussed:    Additional Comments:  Saathvik Every A, RN 07/03/2015, 9:59 AM

## 2015-07-05 ENCOUNTER — Encounter: Payer: Self-pay | Admitting: Internal Medicine

## 2015-07-05 ENCOUNTER — Telehealth: Payer: Self-pay | Admitting: *Deleted

## 2015-07-05 ENCOUNTER — Ambulatory Visit (INDEPENDENT_AMBULATORY_CARE_PROVIDER_SITE_OTHER): Payer: Medicare Other | Admitting: Internal Medicine

## 2015-07-05 VITALS — BP 112/62 | HR 65 | Temp 97.8°F | Wt 123.0 lb

## 2015-07-05 DIAGNOSIS — I639 Cerebral infarction, unspecified: Secondary | ICD-10-CM | POA: Diagnosis not present

## 2015-07-05 DIAGNOSIS — R42 Dizziness and giddiness: Secondary | ICD-10-CM | POA: Insufficient documentation

## 2015-07-05 DIAGNOSIS — Z658 Other specified problems related to psychosocial circumstances: Secondary | ICD-10-CM | POA: Diagnosis not present

## 2015-07-05 DIAGNOSIS — F439 Reaction to severe stress, unspecified: Secondary | ICD-10-CM

## 2015-07-05 DIAGNOSIS — S329XXA Fracture of unspecified parts of lumbosacral spine and pelvis, initial encounter for closed fracture: Secondary | ICD-10-CM | POA: Diagnosis not present

## 2015-07-05 DIAGNOSIS — I25119 Atherosclerotic heart disease of native coronary artery with unspecified angina pectoris: Secondary | ICD-10-CM

## 2015-07-05 MED ORDER — ISOSORBIDE MONONITRATE ER 60 MG PO TB24: 60.0000 mg | ORAL_TABLET | Freq: Every day | ORAL | Status: DC

## 2015-07-05 MED ORDER — GABAPENTIN 300 MG PO CAPS: 300.0000 mg | ORAL_CAPSULE | Freq: Three times a day (TID) | ORAL | Status: DC

## 2015-07-05 MED ORDER — CARVEDILOL 3.125 MG PO TABS: 3.1250 mg | ORAL_TABLET | Freq: Two times a day (BID) | ORAL | Status: DC

## 2015-07-05 MED ORDER — ROSUVASTATIN CALCIUM 5 MG PO TABS: 5.0000 mg | ORAL_TABLET | Freq: Every day | ORAL | Status: DC

## 2015-07-05 MED ORDER — LISINOPRIL 10 MG PO TABS: 10.0000 mg | ORAL_TABLET | Freq: Every day | ORAL | Status: DC

## 2015-07-05 NOTE — Assessment & Plan Note (Signed)
Done with rehab Home therapy planned Still needs walker Has hydrocodone but not using it (just using tylenol)

## 2015-07-05 NOTE — Assessment & Plan Note (Signed)
Has balance problems but doesn't seem vestibular BP has been running low in rehab as well Will try off the amlodipine

## 2015-07-05 NOTE — Telephone Encounter (Signed)
Transition Care Management Follow-up Telephone Call   Date discharged? 07/03/15   How have you been since you were released from the hospital? Patient is weak and dizzy but otherwise doing well.   Do you understand why you were in the hospital? yes   Do you understand the discharge instructions? yes   Where were you discharged to? Home   Items Reviewed:  Medications reviewed: yes  Allergies reviewed: yes  Dietary changes reviewed: n/a  Referrals reviewed: n/a   Functional Questionnaire:   Activities of Daily Living (ADLs):   She states they are independent in the following: bathing and hygiene, feeding, continence, grooming, toileting and dressing States they require assistance with the following: ambulation    Any transportation issues/concerns?: no  Any patient concerns? Medication changes, will discuss at ov   Confirmed importance and date/time of follow-up visits scheduled yes, 07/05/15 @1600   Provider Appointment booked with Tillman Abideichard Letvak, MD  Confirmed with patient if condition begins to worsen call PCP or go to the ER.  Patient was given the office number and encouraged to call back with question or concerns.  : yes

## 2015-07-05 NOTE — Addendum Note (Signed)
Addended by: Desmond DikeKNIGHT, Karina Nofsinger H on: 07/05/2015 05:05 PM   Modules accepted: Orders

## 2015-07-05 NOTE — Assessment & Plan Note (Signed)
Some angina from stress NTG has been effective

## 2015-07-05 NOTE — Assessment & Plan Note (Signed)
Had right cerebral symptoms--but then dizziness  No posterior or brain stem findings No clear stroke on MRI though and no persistent new neuro symptoms

## 2015-07-05 NOTE — Telephone Encounter (Signed)
Has appt this afternoon Concern for stroke which fortunately was not confirmed

## 2015-07-05 NOTE — Patient Instructions (Signed)
Please stop the amlodipine

## 2015-07-05 NOTE — Assessment & Plan Note (Signed)
Now grieving son's death Still trying to figure out support since he drove her everywhere, etc

## 2015-07-05 NOTE — Progress Notes (Signed)
Pre visit review using our clinic review tool, if applicable. No additional management support is needed unless otherwise documented below in the visit note. 

## 2015-07-05 NOTE — Progress Notes (Signed)
Subjective:    Patient ID: Christina [name], female    DOB: 12-Sep-1920, 81 y.o.   MRN: [id number]  HPI Here with daughter [name] for follow up after 2 hospitalizations  Fracture pelvis 1 month ago (almost) Rehab at [facility] Still with some pain Now with walker Done with therapy but was referred for home   [place] home and right back to hospital Had dizziness and weakness on left side No speech problems. No facial droop Initial abnormal CT scan but MRI showed no change Does feel better now but still some residual dizziness Having problems with balance  Gets up slowly Waits on the side of the bed or couch before moving--- head will eventually settle No syncope  Has had regular chest pain in the past 2 days Relates to her emotions NTG does help No sig SOB---did have oxygen in hospital  Husband and his daughter were there much of the time--quite a change in her attitude Trying to recover from son's unexpected death Clearly grieving  Current Outpatient Prescriptions on File Prior to Visit  Medication Sig Dispense Refill  . acetaminophen (TYLENOL) 325 MG tablet Take 2 tablets (650 mg total) by mouth every 6 (six) hours as needed for mild pain (or Fever >/= 101). 60 tablet 6  . amLODipine (NORVASC) 5 MG tablet Take 5 mg by mouth daily.    Marland Kitchen aspirin EC 81 MG tablet Take 81 mg by mouth daily.    . carvedilol (COREG) 3.125 MG tablet Take 3.125 mg by mouth 2 (two) times daily.    Marland Kitchen gabapentin (NEURONTIN) 300 MG capsule Take 300 mg by mouth 3 (three) times daily.    . isosorbide mononitrate (IMDUR) 60 MG 24 hr tablet Take 60 mg by mouth daily.    Marland Kitchen lisinopril (PRINIVIL,ZESTRIL) 10 MG tablet Take 10 mg by mouth daily.    Marland Kitchen MEGARED OMEGA-3 KRILL OIL 500 MG CAPS Take 1 capsule by mouth 2 (two) times daily.    Marland Kitchen MELATONIN PO Take 1 tablet by mouth at bedtime.    . mometasone (NASONEX) 50 MCG/ACT nasal spray Place 2 sprays into the nose daily as needed. (Patient taking differently: Place 2 sprays  into the nose daily as needed (for allergies). ) 17 g 11  . Multiple Vitamins-Minerals (MULTIVITAMIN PO) Take 1 tablet by mouth daily.    . nitroGLYCERIN (NITROSTAT) 0.4 MG SL tablet Place 1 tablet (0.4 mg total) under the tongue every 5 (five) minutes as needed for chest pain. 25 tablet 3  . rosuvastatin (CRESTOR) 5 MG tablet Take 5 mg by mouth daily.     No current facility-administered medications on file prior to visit.    Allergies  Allergen Reactions  . Atorvastatin Other (See Comments)    Reaction:  Blisters on feet   . Diltiazem Hcl Other (See Comments)    Reaction:  Unknown   . Dye Fdc Red [Red Dye] Other (See Comments)    Reaction:  Unknown   . Gemfibrozil Other (See Comments)    Reaction:  Unknown   . Niacin Other (See Comments)    Reaction:  Unknown   . Sulfasalazine Rash    Past Medical History  Diagnosis Date  . CAD (coronary artery disease)     a. S/P previous MI;  b. 04/2007 Cath/PCI: Taxus DES' to mid/distal RCA and RPDA as well as Ramus;  c. myoview 8/09: EF 78%, normal perfusion  . Hypertension   . Osteoporosis   . Peripheral vascular disease   .  Hyperlipidemia     Intolerant of many statins  . Chronic shoulder pain   . Chronic back pain   . Osteoarthritis   . Neuropathy   . Mild mitral and aortic regurgitation     a. 11/2011 Echo: EF 55-65%, No RWMA, Gr 1 DD, Mild AI/MR.  . Carotid stenosis     Bilateral, mild to moderate    Past Surgical History  Procedure Laterality Date  . Carotid u/s  10/2006    0-39% bilaterally  . Vesicovaginal fistula closure w/ tah    . Coronary stent placement  2002    MI, [facility]  . Coronary angioplasty  12/2002    Medical rx only  . Breast biopsy      Bilateral  . Nm myoview ltd  11/2004    Stress, negative; NL EF  . Cp admit  09/2005    Stress myoview negative  . Coronary angioplasty  01/2006    LAD/PAD disease  . Adenosine myoview  01/2006    No ischemia, EF 70%  . Cataract extraction  09/2006    Right  .  Coronary angioplasty  03/2007    3 vessel disease  . Coronary stent placement  04/2007    RCA/LCX multiple, [facility]  . Femoral hernia repair  05/2007    Incarcerated, right  . Fracture surgery  03/2009    Left leg    Family History  Problem Relation Age of Onset  . Pulmonary embolism Mother   . Heart disease Father   . Heart disease Sister   . Cancer Brother   . Heart disease Sister     History   Social History  . Marital Status: Widowed    Spouse Name: N/A  . Number of Children: 3  . Years of Education: N/A   Occupational History  . Not on file.   Social History Main Topics  . Smoking status: Never Smoker   . Smokeless tobacco: Never Used  . Alcohol Use: No  . Drug Use: No  . Sexual Activity: Not on file   Other Topics Concern  . Not on file   Social History Narrative   Widowed 2005 after long time care for husband after stroke   Remarried but seperated   Has 3 children      No formal living will   Daughter Carney BernJean should be her health care POA   Has DNR already   No feeding tube if cognitively unaware   Review of Systems No vertigo Appetite is okay--if she likes the food (and likes salt but BP running very low in rehab) Sleep is still not good--has night awakening that is not new/ trying melatonin 1mg     Objective:   Physical Exam  Constitutional: She appears well-developed. No distress.  Neck: Normal range of motion. Neck supple. No thyromegaly present.  Cardiovascular: Normal rate, regular rhythm and normal heart sounds.  Exam reveals no gallop.   No murmur heard. Pulmonary/Chest: Effort normal and breath sounds normal. No respiratory distress. She has no wheezes. She has no rales.  Musculoskeletal: She exhibits no edema.  Lymphadenopathy:    She has no cervical adenopathy.  Psychiatric: She has a normal mood and affect. Her behavior is normal.          Assessment & Plan:

## 2015-07-08 DIAGNOSIS — S329XXD Fracture of unspecified parts of lumbosacral spine and pelvis, subsequent encounter for fracture with routine healing: Secondary | ICD-10-CM | POA: Diagnosis not present

## 2015-07-08 DIAGNOSIS — I2510 Atherosclerotic heart disease of native coronary artery without angina pectoris: Secondary | ICD-10-CM | POA: Diagnosis not present

## 2015-07-08 DIAGNOSIS — I10 Essential (primary) hypertension: Secondary | ICD-10-CM | POA: Diagnosis not present

## 2015-07-08 DIAGNOSIS — E785 Hyperlipidemia, unspecified: Secondary | ICD-10-CM | POA: Diagnosis not present

## 2015-07-08 DIAGNOSIS — M1990 Unspecified osteoarthritis, unspecified site: Secondary | ICD-10-CM | POA: Diagnosis not present

## 2015-07-08 DIAGNOSIS — I739 Peripheral vascular disease, unspecified: Secondary | ICD-10-CM | POA: Diagnosis not present

## 2015-07-08 DIAGNOSIS — Z9181 History of falling: Secondary | ICD-10-CM | POA: Diagnosis not present

## 2015-07-12 DIAGNOSIS — S3282XD Multiple fractures of pelvis without disruption of pelvic ring, subsequent encounter for fracture with routine healing: Secondary | ICD-10-CM | POA: Diagnosis not present

## 2015-07-12 DIAGNOSIS — M25559 Pain in unspecified hip: Secondary | ICD-10-CM | POA: Diagnosis not present

## 2015-07-12 DIAGNOSIS — M47816 Spondylosis without myelopathy or radiculopathy, lumbar region: Secondary | ICD-10-CM | POA: Diagnosis not present

## 2015-07-12 DIAGNOSIS — M545 Low back pain: Secondary | ICD-10-CM | POA: Diagnosis not present

## 2015-07-13 DIAGNOSIS — S329XXD Fracture of unspecified parts of lumbosacral spine and pelvis, subsequent encounter for fracture with routine healing: Secondary | ICD-10-CM | POA: Diagnosis not present

## 2015-07-13 DIAGNOSIS — Z9181 History of falling: Secondary | ICD-10-CM | POA: Diagnosis not present

## 2015-07-13 DIAGNOSIS — E785 Hyperlipidemia, unspecified: Secondary | ICD-10-CM | POA: Diagnosis not present

## 2015-07-13 DIAGNOSIS — I10 Essential (primary) hypertension: Secondary | ICD-10-CM | POA: Diagnosis not present

## 2015-07-13 DIAGNOSIS — I2510 Atherosclerotic heart disease of native coronary artery without angina pectoris: Secondary | ICD-10-CM | POA: Diagnosis not present

## 2015-07-13 DIAGNOSIS — M1990 Unspecified osteoarthritis, unspecified site: Secondary | ICD-10-CM | POA: Diagnosis not present

## 2015-07-13 DIAGNOSIS — I739 Peripheral vascular disease, unspecified: Secondary | ICD-10-CM | POA: Diagnosis not present

## 2015-07-15 DIAGNOSIS — M1990 Unspecified osteoarthritis, unspecified site: Secondary | ICD-10-CM | POA: Diagnosis not present

## 2015-07-15 DIAGNOSIS — S329XXD Fracture of unspecified parts of lumbosacral spine and pelvis, subsequent encounter for fracture with routine healing: Secondary | ICD-10-CM | POA: Diagnosis not present

## 2015-07-15 DIAGNOSIS — E785 Hyperlipidemia, unspecified: Secondary | ICD-10-CM | POA: Diagnosis not present

## 2015-07-15 DIAGNOSIS — I2510 Atherosclerotic heart disease of native coronary artery without angina pectoris: Secondary | ICD-10-CM | POA: Diagnosis not present

## 2015-07-15 DIAGNOSIS — Z9181 History of falling: Secondary | ICD-10-CM | POA: Diagnosis not present

## 2015-07-15 DIAGNOSIS — I10 Essential (primary) hypertension: Secondary | ICD-10-CM | POA: Diagnosis not present

## 2015-07-15 DIAGNOSIS — I739 Peripheral vascular disease, unspecified: Secondary | ICD-10-CM | POA: Diagnosis not present

## 2015-07-19 ENCOUNTER — Telehealth: Payer: Self-pay

## 2015-07-19 DIAGNOSIS — M1990 Unspecified osteoarthritis, unspecified site: Secondary | ICD-10-CM | POA: Diagnosis not present

## 2015-07-19 DIAGNOSIS — I739 Peripheral vascular disease, unspecified: Secondary | ICD-10-CM | POA: Diagnosis not present

## 2015-07-19 DIAGNOSIS — S329XXD Fracture of unspecified parts of lumbosacral spine and pelvis, subsequent encounter for fracture with routine healing: Secondary | ICD-10-CM | POA: Diagnosis not present

## 2015-07-19 DIAGNOSIS — I10 Essential (primary) hypertension: Secondary | ICD-10-CM | POA: Diagnosis not present

## 2015-07-19 DIAGNOSIS — E785 Hyperlipidemia, unspecified: Secondary | ICD-10-CM | POA: Diagnosis not present

## 2015-07-19 DIAGNOSIS — Z9181 History of falling: Secondary | ICD-10-CM | POA: Diagnosis not present

## 2015-07-19 DIAGNOSIS — I2510 Atherosclerotic heart disease of native coronary artery without angina pectoris: Secondary | ICD-10-CM | POA: Diagnosis not present

## 2015-07-19 NOTE — Telephone Encounter (Signed)
Esther OT with [facility] left v/m; pt refused OT home care; pt said too many people coming to her house now and if pt thinks needs OT pt will call Darral Dash. FYI for Dr Alphonsus Sias.

## 2015-07-19 NOTE — Telephone Encounter (Signed)
Noted  

## 2015-07-21 DIAGNOSIS — I10 Essential (primary) hypertension: Secondary | ICD-10-CM | POA: Diagnosis not present

## 2015-07-21 DIAGNOSIS — M1990 Unspecified osteoarthritis, unspecified site: Secondary | ICD-10-CM | POA: Diagnosis not present

## 2015-07-21 DIAGNOSIS — Z9181 History of falling: Secondary | ICD-10-CM | POA: Diagnosis not present

## 2015-07-21 DIAGNOSIS — E785 Hyperlipidemia, unspecified: Secondary | ICD-10-CM | POA: Diagnosis not present

## 2015-07-21 DIAGNOSIS — I739 Peripheral vascular disease, unspecified: Secondary | ICD-10-CM | POA: Diagnosis not present

## 2015-07-21 DIAGNOSIS — S329XXD Fracture of unspecified parts of lumbosacral spine and pelvis, subsequent encounter for fracture with routine healing: Secondary | ICD-10-CM | POA: Diagnosis not present

## 2015-07-21 DIAGNOSIS — I2510 Atherosclerotic heart disease of native coronary artery without angina pectoris: Secondary | ICD-10-CM | POA: Diagnosis not present

## 2015-07-22 DIAGNOSIS — M1990 Unspecified osteoarthritis, unspecified site: Secondary | ICD-10-CM | POA: Diagnosis not present

## 2015-07-22 DIAGNOSIS — I10 Essential (primary) hypertension: Secondary | ICD-10-CM | POA: Diagnosis not present

## 2015-07-22 DIAGNOSIS — S329XXD Fracture of unspecified parts of lumbosacral spine and pelvis, subsequent encounter for fracture with routine healing: Secondary | ICD-10-CM | POA: Diagnosis not present

## 2015-07-22 DIAGNOSIS — I2510 Atherosclerotic heart disease of native coronary artery without angina pectoris: Secondary | ICD-10-CM | POA: Diagnosis not present

## 2015-07-26 ENCOUNTER — Ambulatory Visit (INDEPENDENT_AMBULATORY_CARE_PROVIDER_SITE_OTHER): Payer: Medicare Other | Admitting: Internal Medicine

## 2015-07-26 ENCOUNTER — Encounter: Payer: Self-pay | Admitting: Internal Medicine

## 2015-07-26 VITALS — BP 120/60 | HR 74 | Temp 97.7°F | Wt 116.0 lb

## 2015-07-26 DIAGNOSIS — M1990 Unspecified osteoarthritis, unspecified site: Secondary | ICD-10-CM | POA: Diagnosis not present

## 2015-07-26 DIAGNOSIS — N3000 Acute cystitis without hematuria: Secondary | ICD-10-CM | POA: Diagnosis not present

## 2015-07-26 DIAGNOSIS — E785 Hyperlipidemia, unspecified: Secondary | ICD-10-CM | POA: Diagnosis not present

## 2015-07-26 DIAGNOSIS — M5489 Other dorsalgia: Secondary | ICD-10-CM

## 2015-07-26 DIAGNOSIS — Z9181 History of falling: Secondary | ICD-10-CM | POA: Diagnosis not present

## 2015-07-26 DIAGNOSIS — S329XXD Fracture of unspecified parts of lumbosacral spine and pelvis, subsequent encounter for fracture with routine healing: Secondary | ICD-10-CM | POA: Diagnosis not present

## 2015-07-26 DIAGNOSIS — R350 Frequency of micturition: Secondary | ICD-10-CM | POA: Diagnosis not present

## 2015-07-26 DIAGNOSIS — I2510 Atherosclerotic heart disease of native coronary artery without angina pectoris: Secondary | ICD-10-CM | POA: Diagnosis not present

## 2015-07-26 DIAGNOSIS — I739 Peripheral vascular disease, unspecified: Secondary | ICD-10-CM | POA: Diagnosis not present

## 2015-07-26 DIAGNOSIS — I10 Essential (primary) hypertension: Secondary | ICD-10-CM | POA: Diagnosis not present

## 2015-07-26 LAB — POCT URINALYSIS DIPSTICK
BILIRUBIN UA: NEGATIVE
Glucose, UA: NEGATIVE
Ketones, UA: NEGATIVE
NITRITE UA: POSITIVE
Spec Grav, UA: 1.02
UROBILINOGEN UA: NEGATIVE
pH, UA: 6

## 2015-07-26 MED ORDER — CIPROFLOXACIN HCL 250 MG PO TABS: 250.0000 mg | ORAL_TABLET | Freq: Two times a day (BID) | ORAL | Status: DC

## 2015-07-26 MED ORDER — HYDROCODONE-ACETAMINOPHEN 5-325 MG PO TABS: 1.0000 | ORAL_TABLET | Freq: Four times a day (QID) | ORAL | Status: DC | PRN

## 2015-07-26 NOTE — Assessment & Plan Note (Signed)
Bilateral lateral lumbar May still be healing from pelvic fracture and stiffness/arthritis (especially with prolonged immobility only just starting to improve) Has tramadol and will refill the hydrocodone---- 1 of each has worked better than 2 of either (puzzling but it is what she has noticed)

## 2015-07-26 NOTE — Progress Notes (Signed)
Pre visit review using our clinic review tool, if applicable. No additional management support is needed unless otherwise documented below in the visit note. 

## 2015-07-26 NOTE — Assessment & Plan Note (Signed)
Urgency and abnormal urine but no systemic symptoms Will try 3 days of antibiotic empirically but not clear if she has infection Not enough urine to culture

## 2015-07-26 NOTE — Progress Notes (Signed)
Subjective:    Patient ID: Christina [name], female    DOB: 09/30/1920, 81 y.o.   MRN: [id number]  HPI Here with daughter due to concern for UTI  Has had urinary frequency--just little bits. For about a week Having bilateral back pain No dysuria or hematuria No fever Fatigued--trouble working with PT--see her note  Had x-rays on back by Dr Joice Lofts Looked okay He tried tramadol instead of the hydrocodone-but they were only helping when she took them together  Current Outpatient Prescriptions on File Prior to Visit  Medication Sig Dispense Refill  . acetaminophen (TYLENOL) 325 MG tablet Take 2 tablets (650 mg total) by mouth every 6 (six) hours as needed for mild pain (or Fever >/= 101). 60 tablet 6  . aspirin EC 81 MG tablet Take 81 mg by mouth daily.    . carvedilol (COREG) 3.125 MG tablet Take 1 tablet (3.125 mg total) by mouth 2 (two) times daily. 180 tablet 3  . gabapentin (NEURONTIN) 300 MG capsule Take 1 capsule (300 mg total) by mouth 3 (three) times daily. 270 capsule 3  . isosorbide mononitrate (IMDUR) 60 MG 24 hr tablet Take 1 tablet (60 mg total) by mouth daily. 90 tablet 3  . lisinopril (PRINIVIL,ZESTRIL) 10 MG tablet Take 1 tablet (10 mg total) by mouth daily. 90 tablet 3  . MEGARED OMEGA-3 KRILL OIL 500 MG CAPS Take 1 capsule by mouth 2 (two) times daily.    Marland Kitchen MELATONIN PO Take 1 tablet by mouth at bedtime.    . Multiple Vitamins-Minerals (MULTIVITAMIN PO) Take 1 tablet by mouth daily.    . nitroGLYCERIN (NITROSTAT) 0.4 MG SL tablet Place 1 tablet (0.4 mg total) under the tongue every 5 (five) minutes as needed for chest pain. 25 tablet 3  . rosuvastatin (CRESTOR) 5 MG tablet Take 1 tablet (5 mg total) by mouth daily. 90 tablet 3   No current facility-administered medications on file prior to visit.    Allergies  Allergen Reactions  . Atorvastatin Other (See Comments)    Reaction:  Blisters on feet   . Diltiazem Hcl Other (See Comments)    Reaction:  Unknown   . Dye  Fdc Red [Red Dye] Other (See Comments)    Reaction:  Unknown   . Gemfibrozil Other (See Comments)    Reaction:  Unknown   . Niacin Other (See Comments)    Reaction:  Unknown   . Sulfasalazine Rash    Past Medical History  Diagnosis Date  . CAD (coronary artery disease)     a. S/P previous MI;  b. 04/2007 Cath/PCI: Taxus DES' to mid/distal RCA and RPDA as well as Ramus;  c. myoview 8/09: EF 78%, normal perfusion  . Hypertension   . Osteoporosis   . Peripheral vascular disease   . Hyperlipidemia     Intolerant of many statins  . Chronic shoulder pain   . Chronic back pain   . Osteoarthritis   . Neuropathy   . Mild mitral and aortic regurgitation     a. 11/2011 Echo: EF 55-65%, No RWMA, Gr 1 DD, Mild AI/MR.  . Carotid stenosis     Bilateral, mild to moderate    Past Surgical History  Procedure Laterality Date  . Carotid u/s  10/2006    0-39% bilaterally  . Vesicovaginal fistula closure w/ tah    . Coronary stent placement  2002    MI, [facility]  . Coronary angioplasty  12/2002    Medical rx only  .  Breast biopsy      Bilateral  . Nm myoview ltd  11/2004    Stress, negative; NL EF  . Cp admit  09/2005    Stress myoview negative  . Coronary angioplasty  01/2006    LAD/PAD disease  . Adenosine myoview  01/2006    No ischemia, EF 70%  . Cataract extraction  09/2006    Right  . Coronary angioplasty  03/2007    3 vessel disease  . Coronary stent placement  04/2007    RCA/LCX multiple, [facility]  . Femoral hernia repair  05/2007    Incarcerated, right  . Fracture surgery  03/2009    Left leg    Family History  Problem Relation Age of Onset  . Pulmonary embolism Mother   . Heart disease Father   . Heart disease Sister   . Cancer Brother   . Heart disease Sister     History   Social History  . Marital Status: Widowed    Spouse Name: N/A  . Number of Children: 3  . Years of Education: N/A   Occupational History  . Not on file.   Social History Main Topics  .  Smoking status: Never Smoker   . Smokeless tobacco: Never Used  . Alcohol Use: No  . Drug Use: No  . Sexual Activity: Not on file   Other Topics Concern  . Not on file   Social History Narrative   Widowed 2005 after long time care for husband after stroke   Remarried but seperated   Has 3 children      No formal living will   Daughter [name] should be her health care POA   Has DNR already   No feeding tube if cognitively unaware   Review of Systems No abdominal pain but has nausea and decreased appetite No constipation---was while in rehab though    Objective:   Physical Exam  Constitutional: She appears well-developed. No distress.  Abdominal: Soft. She exhibits no distension. There is no tenderness. There is no rebound and no guarding.  Musculoskeletal:  Low back pain laterally but not at CVA area or over spine          Assessment & Plan:

## 2015-07-28 DIAGNOSIS — S329XXD Fracture of unspecified parts of lumbosacral spine and pelvis, subsequent encounter for fracture with routine healing: Secondary | ICD-10-CM | POA: Diagnosis not present

## 2015-07-28 DIAGNOSIS — I739 Peripheral vascular disease, unspecified: Secondary | ICD-10-CM | POA: Diagnosis not present

## 2015-07-28 DIAGNOSIS — Z9181 History of falling: Secondary | ICD-10-CM | POA: Diagnosis not present

## 2015-07-28 DIAGNOSIS — I10 Essential (primary) hypertension: Secondary | ICD-10-CM | POA: Diagnosis not present

## 2015-07-28 DIAGNOSIS — M1990 Unspecified osteoarthritis, unspecified site: Secondary | ICD-10-CM | POA: Diagnosis not present

## 2015-07-28 DIAGNOSIS — I2510 Atherosclerotic heart disease of native coronary artery without angina pectoris: Secondary | ICD-10-CM | POA: Diagnosis not present

## 2015-07-28 DIAGNOSIS — E785 Hyperlipidemia, unspecified: Secondary | ICD-10-CM | POA: Diagnosis not present

## 2015-07-30 ENCOUNTER — Telehealth: Payer: Self-pay | Admitting: Internal Medicine

## 2015-07-30 NOTE — Telephone Encounter (Signed)
That is fine 

## 2015-07-30 NOTE — Telephone Encounter (Signed)
[name] with [facility] requesting orders for 1 more week of in home PT due to set back of no pain medication Call back number is [phone]

## 2015-07-30 NOTE — Telephone Encounter (Signed)
Left message on machine with ok for verbal order.

## 2015-08-02 DIAGNOSIS — E785 Hyperlipidemia, unspecified: Secondary | ICD-10-CM | POA: Diagnosis not present

## 2015-08-02 DIAGNOSIS — I739 Peripheral vascular disease, unspecified: Secondary | ICD-10-CM | POA: Diagnosis not present

## 2015-08-02 DIAGNOSIS — Z9181 History of falling: Secondary | ICD-10-CM | POA: Diagnosis not present

## 2015-08-02 DIAGNOSIS — M47816 Spondylosis without myelopathy or radiculopathy, lumbar region: Secondary | ICD-10-CM | POA: Diagnosis not present

## 2015-08-02 DIAGNOSIS — M1990 Unspecified osteoarthritis, unspecified site: Secondary | ICD-10-CM | POA: Diagnosis not present

## 2015-08-02 DIAGNOSIS — I10 Essential (primary) hypertension: Secondary | ICD-10-CM | POA: Diagnosis not present

## 2015-08-02 DIAGNOSIS — I2510 Atherosclerotic heart disease of native coronary artery without angina pectoris: Secondary | ICD-10-CM | POA: Diagnosis not present

## 2015-08-02 DIAGNOSIS — S329XXD Fracture of unspecified parts of lumbosacral spine and pelvis, subsequent encounter for fracture with routine healing: Secondary | ICD-10-CM | POA: Diagnosis not present

## 2015-08-04 DIAGNOSIS — Z9181 History of falling: Secondary | ICD-10-CM | POA: Diagnosis not present

## 2015-08-04 DIAGNOSIS — I2510 Atherosclerotic heart disease of native coronary artery without angina pectoris: Secondary | ICD-10-CM | POA: Diagnosis not present

## 2015-08-04 DIAGNOSIS — I10 Essential (primary) hypertension: Secondary | ICD-10-CM | POA: Diagnosis not present

## 2015-08-04 DIAGNOSIS — M1990 Unspecified osteoarthritis, unspecified site: Secondary | ICD-10-CM | POA: Diagnosis not present

## 2015-08-04 DIAGNOSIS — E785 Hyperlipidemia, unspecified: Secondary | ICD-10-CM | POA: Diagnosis not present

## 2015-08-04 DIAGNOSIS — S329XXD Fracture of unspecified parts of lumbosacral spine and pelvis, subsequent encounter for fracture with routine healing: Secondary | ICD-10-CM | POA: Diagnosis not present

## 2015-08-04 DIAGNOSIS — I739 Peripheral vascular disease, unspecified: Secondary | ICD-10-CM | POA: Diagnosis not present

## 2015-08-04 NOTE — Telephone Encounter (Signed)
Pt had another fall and bumped elbow yesterday (fell on carpet) , she is not eating well for 3 weeks and when she gets up she is dizzy and feels sick, she is not exercising.  Do you want to see pt? PT has run out of authorizations for PT.  [name] number [phone] for Christina [name]

## 2015-08-05 ENCOUNTER — Telehealth: Payer: Self-pay | Admitting: Internal Medicine

## 2015-08-05 NOTE — Telephone Encounter (Signed)
Please set up an appt.

## 2015-08-05 NOTE — Telephone Encounter (Signed)
Left message on machine to schedule appointment.

## 2015-08-05 NOTE — Telephone Encounter (Signed)
Scheduled for 8/22 Christina [name] aware

## 2015-08-09 ENCOUNTER — Encounter: Payer: Self-pay | Admitting: Internal Medicine

## 2015-08-09 ENCOUNTER — Ambulatory Visit (INDEPENDENT_AMBULATORY_CARE_PROVIDER_SITE_OTHER): Payer: Medicare Other | Admitting: Internal Medicine

## 2015-08-09 VITALS — BP 130/70 | HR 71 | Temp 98.1°F | Wt 116.0 lb

## 2015-08-09 DIAGNOSIS — R42 Dizziness and giddiness: Secondary | ICD-10-CM

## 2015-08-09 NOTE — Patient Instructions (Signed)
USE THE COMMODE till you are wide awake and no dizziness!!! Decrease the gabapentin to just 1 capsule at bedtime

## 2015-08-09 NOTE — Progress Notes (Signed)
Pre visit review using our clinic review tool, if applicable. No additional management support is needed unless otherwise documented below in the visit note. 

## 2015-08-09 NOTE — Assessment & Plan Note (Signed)
Worst in AM 2 recent falls--fortunately without sig injury Will try decreasing PM gabapentin Uses walker well but needs to use commode until wide awake and dizziness gone in AM

## 2015-08-09 NOTE — Progress Notes (Signed)
Subjective:    Patient ID: Christina [name], female    DOB: Apr 18, 1920, 81 y.o.   MRN: [id number]  HPI Here with daughter  Some ongoing grieving for her son Seems to be perking up some in the last few days  Has had 2 falls in the past week Fell in bathtub-- backwards and legs hanging out of tub Got pump on back of head and right elbow banged  Other fall was into the side of the closet door Had to crawl to chair to stand up  Gets dizzy Has fallen even when holding onto walker  Ongoing but intermittent low back pain (not pelvis)\ Tries to avoid the analgesics--at most 1 a day  Home PT has finished I just reauthorized her to continue  Not really doing any instrumental ADLs Daughter [name] is staying with her--other than work hours Husband comes over in daytime Getting out yesterday helped some  Current Outpatient Prescriptions on File Prior to Visit  Medication Sig Dispense Refill  . acetaminophen (TYLENOL) 325 MG tablet Take 2 tablets (650 mg total) by mouth every 6 (six) hours as needed for mild pain (or Fever >/= 101). 60 tablet 6  . aspirin EC 81 MG tablet Take 81 mg by mouth daily.    . carvedilol (COREG) 3.125 MG tablet Take 1 tablet (3.125 mg total) by mouth 2 (two) times daily. 180 tablet 3  . gabapentin (NEURONTIN) 300 MG capsule Take 1 capsule (300 mg total) by mouth 3 (three) times daily. 270 capsule 3  . HYDROcodone-acetaminophen (NORCO/VICODIN) 5-325 MG per tablet Take 1 tablet by mouth every 6 (six) hours as needed for moderate pain. 60 tablet 0  . isosorbide mononitrate (IMDUR) 60 MG 24 hr tablet Take 1 tablet (60 mg total) by mouth daily. 90 tablet 3  . lisinopril (PRINIVIL,ZESTRIL) 10 MG tablet Take 1 tablet (10 mg total) by mouth daily. 90 tablet 3  . MEGARED OMEGA-3 KRILL OIL 500 MG CAPS Take 1 capsule by mouth 2 (two) times daily.    Marland Kitchen MELATONIN PO Take 1 tablet by mouth at bedtime.    . mometasone (NASONEX) 50 MCG/ACT nasal spray Place 2 sprays into the nose as  needed.    . Multiple Vitamins-Minerals (MULTIVITAMIN PO) Take 1 tablet by mouth daily.    . nitroGLYCERIN (NITROSTAT) 0.4 MG SL tablet Place 1 tablet (0.4 mg total) under the tongue every 5 (five) minutes as needed for chest pain. 25 tablet 3  . rosuvastatin (CRESTOR) 5 MG tablet Take 1 tablet (5 mg total) by mouth daily. 90 tablet 3  . traMADol (ULTRAM) 50 MG tablet Take 50 mg by mouth every 6 (six) hours as needed. for pain  0   No current facility-administered medications on file prior to visit.    Allergies  Allergen Reactions  . Atorvastatin Other (See Comments)    Reaction:  Blisters on feet   . Diltiazem Hcl Other (See Comments)    Reaction:  Unknown   . Dye Fdc Red [Red Dye] Other (See Comments)    Reaction:  Unknown   . Gemfibrozil Other (See Comments)    Reaction:  Unknown   . Niacin Other (See Comments)    Reaction:  Unknown   . Sulfasalazine Rash    Past Medical History  Diagnosis Date  . CAD (coronary artery disease)     a. S/P previous MI;  b. 04/2007 Cath/PCI: Taxus DES' to mid/distal RCA and RPDA as well as Ramus;  c. myoview 8/09:  EF 78%, normal perfusion  . Hypertension   . Osteoporosis   . Peripheral vascular disease   . Hyperlipidemia     Intolerant of many statins  . Chronic shoulder pain   . Chronic back pain   . Osteoarthritis   . Neuropathy   . Mild mitral and aortic regurgitation     a. 11/2011 Echo: EF 55-65%, No RWMA, Gr 1 DD, Mild AI/MR.  . Carotid stenosis     Bilateral, mild to moderate    Past Surgical History  Procedure Laterality Date  . Carotid u/s  10/2006    0-39% bilaterally  . Vesicovaginal fistula closure w/ tah    . Coronary stent placement  2002    MI, [facility]  . Coronary angioplasty  12/2002    Medical rx only  . Breast biopsy      Bilateral  . Nm myoview ltd  11/2004    Stress, negative; NL EF  . Cp admit  09/2005    Stress myoview negative  . Coronary angioplasty  01/2006    LAD/PAD disease  . Adenosine myoview   01/2006    No ischemia, EF 70%  . Cataract extraction  09/2006    Right  . Coronary angioplasty  03/2007    3 vessel disease  . Coronary stent placement  04/2007    RCA/LCX multiple, [facility]  . Femoral hernia repair  05/2007    Incarcerated, right  . Fracture surgery  03/2009    Left leg    Family History  Problem Relation Age of Onset  . Pulmonary embolism Mother   . Heart disease Father   . Heart disease Sister   . Cancer Brother   . Heart disease Sister     Social History   Social History  . Marital Status: Widowed    Spouse Name: N/A  . Number of Children: 3  . Years of Education: N/A   Occupational History  . Not on file.   Social History Main Topics  . Smoking status: Never Smoker   . Smokeless tobacco: Never Used  . Alcohol Use: No  . Drug Use: No  . Sexual Activity: Not on file   Other Topics Concern  . Not on file   Social History Narrative   Widowed 2005 after long time care for husband after stroke   Remarried but seperated   Has 3 children      No formal living will   Daughter [name] should be her health care POA   Has DNR already   No feeding tube if cognitively unaware   Review of Systems Eating well now Weight is stable Hard to initiate sleep--"things go through my mind so much"    Objective:   Physical Exam  Constitutional: She appears well-developed. No distress.  HENT:  No trauma to skull  Cardiovascular: Normal rate, regular rhythm and normal heart sounds.  Exam reveals no gallop.   No murmur heard. Pulmonary/Chest: Effort normal and breath sounds normal. No respiratory distress. She has no wheezes. She has no rales.  Musculoskeletal:  Right elbow normal ROM and no tenderness          Assessment & Plan:

## 2015-08-10 DIAGNOSIS — Z9181 History of falling: Secondary | ICD-10-CM | POA: Diagnosis not present

## 2015-08-10 DIAGNOSIS — S329XXD Fracture of unspecified parts of lumbosacral spine and pelvis, subsequent encounter for fracture with routine healing: Secondary | ICD-10-CM | POA: Diagnosis not present

## 2015-08-10 DIAGNOSIS — E785 Hyperlipidemia, unspecified: Secondary | ICD-10-CM | POA: Diagnosis not present

## 2015-08-10 DIAGNOSIS — I739 Peripheral vascular disease, unspecified: Secondary | ICD-10-CM | POA: Diagnosis not present

## 2015-08-10 DIAGNOSIS — I2510 Atherosclerotic heart disease of native coronary artery without angina pectoris: Secondary | ICD-10-CM | POA: Diagnosis not present

## 2015-08-10 DIAGNOSIS — I10 Essential (primary) hypertension: Secondary | ICD-10-CM | POA: Diagnosis not present

## 2015-08-10 DIAGNOSIS — M1990 Unspecified osteoarthritis, unspecified site: Secondary | ICD-10-CM | POA: Diagnosis not present

## 2015-08-12 DIAGNOSIS — M1990 Unspecified osteoarthritis, unspecified site: Secondary | ICD-10-CM | POA: Diagnosis not present

## 2015-08-12 DIAGNOSIS — I10 Essential (primary) hypertension: Secondary | ICD-10-CM | POA: Diagnosis not present

## 2015-08-12 DIAGNOSIS — S329XXD Fracture of unspecified parts of lumbosacral spine and pelvis, subsequent encounter for fracture with routine healing: Secondary | ICD-10-CM | POA: Diagnosis not present

## 2015-08-12 DIAGNOSIS — I739 Peripheral vascular disease, unspecified: Secondary | ICD-10-CM | POA: Diagnosis not present

## 2015-08-12 DIAGNOSIS — I2510 Atherosclerotic heart disease of native coronary artery without angina pectoris: Secondary | ICD-10-CM | POA: Diagnosis not present

## 2015-08-12 DIAGNOSIS — Z9181 History of falling: Secondary | ICD-10-CM | POA: Diagnosis not present

## 2015-08-12 DIAGNOSIS — E785 Hyperlipidemia, unspecified: Secondary | ICD-10-CM | POA: Diagnosis not present

## 2015-08-17 DIAGNOSIS — E785 Hyperlipidemia, unspecified: Secondary | ICD-10-CM | POA: Diagnosis not present

## 2015-08-17 DIAGNOSIS — Z9181 History of falling: Secondary | ICD-10-CM | POA: Diagnosis not present

## 2015-08-17 DIAGNOSIS — I2510 Atherosclerotic heart disease of native coronary artery without angina pectoris: Secondary | ICD-10-CM | POA: Diagnosis not present

## 2015-08-17 DIAGNOSIS — I10 Essential (primary) hypertension: Secondary | ICD-10-CM | POA: Diagnosis not present

## 2015-08-17 DIAGNOSIS — I739 Peripheral vascular disease, unspecified: Secondary | ICD-10-CM | POA: Diagnosis not present

## 2015-08-17 DIAGNOSIS — M1990 Unspecified osteoarthritis, unspecified site: Secondary | ICD-10-CM | POA: Diagnosis not present

## 2015-08-17 DIAGNOSIS — S329XXD Fracture of unspecified parts of lumbosacral spine and pelvis, subsequent encounter for fracture with routine healing: Secondary | ICD-10-CM | POA: Diagnosis not present

## 2015-08-19 ENCOUNTER — Other Ambulatory Visit: Payer: Self-pay | Admitting: Cardiovascular Disease

## 2015-09-06 ENCOUNTER — Encounter: Payer: Self-pay | Admitting: Internal Medicine

## 2015-09-06 ENCOUNTER — Ambulatory Visit (INDEPENDENT_AMBULATORY_CARE_PROVIDER_SITE_OTHER): Payer: Medicare Other | Admitting: Internal Medicine

## 2015-09-06 VITALS — BP 136/60 | HR 70 | Temp 97.5°F | Ht 61.0 in | Wt 114.0 lb

## 2015-09-06 DIAGNOSIS — S329XXD Fracture of unspecified parts of lumbosacral spine and pelvis, subsequent encounter for fracture with routine healing: Secondary | ICD-10-CM | POA: Diagnosis not present

## 2015-09-06 DIAGNOSIS — I6529 Occlusion and stenosis of unspecified carotid artery: Secondary | ICD-10-CM | POA: Diagnosis not present

## 2015-09-06 DIAGNOSIS — Z23 Encounter for immunization: Secondary | ICD-10-CM | POA: Diagnosis not present

## 2015-09-06 DIAGNOSIS — I25119 Atherosclerotic heart disease of native coronary artery with unspecified angina pectoris: Secondary | ICD-10-CM

## 2015-09-06 DIAGNOSIS — Z7189 Other specified counseling: Secondary | ICD-10-CM

## 2015-09-06 DIAGNOSIS — Z0000 Encounter for general adult medical examination without abnormal findings: Secondary | ICD-10-CM

## 2015-09-06 DIAGNOSIS — F39 Unspecified mood [affective] disorder: Secondary | ICD-10-CM | POA: Insufficient documentation

## 2015-09-06 DIAGNOSIS — G3184 Mild cognitive impairment, so stated: Secondary | ICD-10-CM

## 2015-09-06 NOTE — Assessment & Plan Note (Signed)
Not advanced enough for intervention

## 2015-09-06 NOTE — Progress Notes (Signed)
Subjective:    Patient ID: Christina [name], female    DOB: 1920/06/18, 81 y.o.   MRN: [id number]  HPI Here for Medicare wellness visit and follow up of chronic medical conditions Here with daughter [name] Reviewed form and advanced directives Reviewed other doctors No tobacco or alcohol Vision is not good. Hearing is not great either---but gets along okay Doesn't drive. Daughter jean still handling most of her instrumental ADLs. She will microwave meals, etc. Tries to sweep at times--discussed using good judgement Falls with fractures this year Still sad a lot with son's recent death--still grieving  Pelvic fracture has healed Walking without walker at times--still uses it often especially if out in the yard Done with the PT Some back pain still--- not taking the pain pills much  Some dizziness still--if she puts her head back and then brings head back Gets vertigo when lying back in bed No recent angina No recent NTG need Breathing is okay No leg pain now--- not walking as much though  Continues to have mild memory issues No progression per daughter--not really a big deal  Current Outpatient Prescriptions on File Prior to Visit  Medication Sig Dispense Refill  . acetaminophen (TYLENOL) 325 MG tablet Take 2 tablets (650 mg total) by mouth every 6 (six) hours as needed for mild pain (or Fever >/= 101). 60 tablet 6  . aspirin EC 81 MG tablet Take 81 mg by mouth daily.    . carvedilol (COREG) 3.125 MG tablet Take 1 tablet (3.125 mg total) by mouth 2 (two) times daily. 180 tablet 3  . gabapentin (NEURONTIN) 300 MG capsule Take 1 capsule (300 mg total) by mouth 3 (three) times daily. (Patient taking differently: Take 300 mg by mouth at bedtime. ) 270 capsule 3  . HYDROcodone-acetaminophen (NORCO/VICODIN) 5-325 MG per tablet Take 1 tablet by mouth every 6 (six) hours as needed for moderate pain. 60 tablet 0  . isosorbide mononitrate (IMDUR) 60 MG 24 hr tablet Take 1 tablet (60 mg total)  by mouth daily. 90 tablet 3  . lisinopril (PRINIVIL,ZESTRIL) 10 MG tablet TAKE ONE TABLET BY MOUTH ONCE DAILY 30 tablet 3  . MEGARED OMEGA-3 KRILL OIL 500 MG CAPS Take 1 capsule by mouth 2 (two) times daily.    Marland Kitchen MELATONIN PO Take 1 tablet by mouth at bedtime.    . mometasone (NASONEX) 50 MCG/ACT nasal spray Place 2 sprays into the nose as needed.    . Multiple Vitamins-Minerals (MULTIVITAMIN PO) Take 1 tablet by mouth daily.    . nitroGLYCERIN (NITROSTAT) 0.4 MG SL tablet Place 1 tablet (0.4 mg total) under the tongue every 5 (five) minutes as needed for chest pain. 25 tablet 3  . rosuvastatin (CRESTOR) 5 MG tablet Take 1 tablet (5 mg total) by mouth daily. 90 tablet 3  . traMADol (ULTRAM) 50 MG tablet Take 50 mg by mouth every 6 (six) hours as needed. for pain  0   No current facility-administered medications on file prior to visit.    Allergies  Allergen Reactions  . Atorvastatin Other (See Comments)    Reaction:  Blisters on feet   . Diltiazem Hcl Other (See Comments)    Reaction:  Unknown   . Dye Fdc Red [Red Dye] Other (See Comments)    Reaction:  Unknown   . Gemfibrozil Other (See Comments)    Reaction:  Unknown   . Niacin Other (See Comments)    Reaction:  Unknown   . Sulfasalazine Rash  Past Medical History  Diagnosis Date  . CAD (coronary artery disease)     a. S/P previous MI;  b. 04/2007 Cath/PCI: Taxus DES' to mid/distal RCA and RPDA as well as Ramus;  c. myoview 8/09: EF 78%, normal perfusion  . Hypertension   . Osteoporosis   . Peripheral vascular disease   . Hyperlipidemia     Intolerant of many statins  . Chronic shoulder pain   . Chronic back pain   . Osteoarthritis   . Neuropathy   . Mild mitral and aortic regurgitation     a. 11/2011 Echo: EF 55-65%, No RWMA, Gr 1 DD, Mild AI/MR.  . Carotid stenosis     Bilateral, mild to moderate  . Pelvic fracture 6/16    Past Surgical History  Procedure Laterality Date  . Carotid u/s  10/2006    0-39%  bilaterally  . Vesicovaginal fistula closure w/ tah    . Coronary stent placement  2002    MI, [facility]  . Coronary angioplasty  12/2002    Medical rx only  . Breast biopsy      Bilateral  . Nm myoview ltd  11/2004    Stress, negative; NL EF  . Cp admit  09/2005    Stress myoview negative  . Coronary angioplasty  01/2006    LAD/PAD disease  . Adenosine myoview  01/2006    No ischemia, EF 70%  . Cataract extraction  09/2006    Right  . Coronary angioplasty  03/2007    3 vessel disease  . Coronary stent placement  04/2007    RCA/LCX multiple, [facility]  . Femoral hernia repair  05/2007    Incarcerated, right  . Fracture surgery  03/2009    Left leg    Family History  Problem Relation Age of Onset  . Pulmonary embolism Mother   . Heart disease Father   . Heart disease Sister   . Cancer Brother   . Heart disease Sister     Social History   Social History  . Marital Status: Widowed    Spouse Name: N/A  . Number of Children: 3  . Years of Education: N/A   Occupational History  . Not on file.   Social History Main Topics  . Smoking status: Never Smoker   . Smokeless tobacco: Never Used  . Alcohol Use: No  . Drug Use: No  . Sexual Activity: Not on file   Other Topics Concern  . Not on file   Social History Narrative   Widowed 2005 after long time care for husband after stroke   Remarried but seperated   Has 3 children      No formal living will   Daughter [name] should be her health care POA   Has DNR already   No feeding tube if cognitively unaware   Review of Systems Voiding okay ---no urinary symptoms Bowels are okay Teeth not great--not ready to get dental work done Eating okay-- weight is down 2# in past month. Daughter notes she gets filled easily. Discussed adding boost or ensure. Sleeps better now    Objective:   Physical Exam  Constitutional: She is oriented to person, place, and time. She appears well-developed. No distress.  HENT:    Mouth/Throat: Oropharynx is clear and moist. No oropharyngeal exudate.  Full dentures  Neck: Normal range of motion. Neck supple. No thyromegaly present.  Cardiovascular: Normal rate, regular rhythm, normal heart sounds and intact distal pulses.  Exam reveals no  gallop.   No murmur heard. Pulmonary/Chest: Effort normal and breath sounds normal. No respiratory distress. She has no wheezes. She has no rales.  Abdominal: Soft. There is no tenderness.  Musculoskeletal: She exhibits no edema.  Lymphadenopathy:    She has no cervical adenopathy.  Neurological: She is alert and oriented to person, place, and time.  President-- "Obama, ?" [phone] [id number] Recall 0/3  Psychiatric: Her behavior is normal.  melancholy          Assessment & Plan:

## 2015-09-06 NOTE — Assessment & Plan Note (Signed)
I have personally reviewed the Medicare Annual Wellness questionnaire and have noted 1. The patient's medical and social history 2. Their use of alcohol, tobacco or illicit drugs 3. Their current medications and supplements 4. The patient's functional ability including ADL's, fall risks, home safety risks and hearing or visual             impairment. 5. Diet and physical activities 6. Evidence for depression or mood disorders  The patients weight, height, BMI and visual acuity have been recorded in the chart I have made referrals, counseling and provided education to the patient based review of the above and I have provided the pt with a written personalized care plan for preventive services.  I have provided you with a copy of your personalized plan for preventive services. Please take the time to review along with your updated medication list.  Flu vaccine today No cancer screening due to age Will hold off on zostavax-- had shingles already (and unclear how effective vaccine is at her age)

## 2015-09-06 NOTE — Assessment & Plan Note (Signed)
Mostly grieving son--but also loss of functional independence Doesn't seem to be medication sensitive for now

## 2015-09-06 NOTE — Addendum Note (Signed)
Addended by: Sueanne Margarita on: 09/06/2015 05:05 PM   Modules accepted: Orders

## 2015-09-06 NOTE — Assessment & Plan Note (Signed)
Has DNR 

## 2015-09-06 NOTE — Assessment & Plan Note (Signed)
Mostly recovered but still has pain Hasn't recovered former functional level---discussed not doing things that could precipitate fall (like sweeping leaves on back deck) Uses analgesics at times

## 2015-09-06 NOTE — Assessment & Plan Note (Signed)
Doesn't seem to have worsened per daughter Tough to evaluate now with her grieving

## 2015-09-06 NOTE — Assessment & Plan Note (Signed)
Stable now No med changes needed

## 2015-09-06 NOTE — Progress Notes (Signed)
Pre visit review using our clinic review tool, if applicable. No additional management support is needed unless otherwise documented below in the visit note. 

## 2015-10-25 ENCOUNTER — Encounter: Payer: Self-pay | Admitting: Cardiovascular Disease

## 2015-10-25 ENCOUNTER — Ambulatory Visit (INDEPENDENT_AMBULATORY_CARE_PROVIDER_SITE_OTHER): Payer: Medicare Other | Admitting: Cardiovascular Disease

## 2015-10-25 VITALS — BP 100/60 | HR 72 | Ht 61.5 in | Wt 115.5 lb

## 2015-10-25 DIAGNOSIS — I10 Essential (primary) hypertension: Secondary | ICD-10-CM | POA: Diagnosis not present

## 2015-10-25 DIAGNOSIS — I25119 Atherosclerotic heart disease of native coronary artery with unspecified angina pectoris: Secondary | ICD-10-CM

## 2015-10-25 DIAGNOSIS — I6523 Occlusion and stenosis of bilateral carotid arteries: Secondary | ICD-10-CM | POA: Diagnosis not present

## 2015-10-25 MED ORDER — NITROGLYCERIN 0.4 MG SL SUBL: 0.4000 mg | SUBLINGUAL_TABLET | SUBLINGUAL | Status: DC | PRN

## 2015-10-25 MED ORDER — ISOSORBIDE MONONITRATE ER 30 MG PO TB24: 30.0000 mg | ORAL_TABLET | Freq: Every day | ORAL | Status: DC

## 2015-10-25 NOTE — Assessment & Plan Note (Signed)
Blood pressure is somewhat on the low side. It's not clear if she is taking lisinopril or not. I instructed them not to resume the lisinopril if she is not taking it. I also decreased the dose of Imdur to 30 mg once daily.

## 2015-10-25 NOTE — Assessment & Plan Note (Signed)
She is doing well overall with no symptoms suggestive of angina. Continue medical therapy. 

## 2015-10-25 NOTE — Patient Instructions (Signed)
Medication Instructions:  Your physician has recommended you make the following change in your medication:  DECREASE imdur to 30mg  once per day   Labwork: none  Testing/Procedures: none  Follow-Up: Your physician wants you to follow-up in: six months with Dr. Kirke CorinArida.  You will receive a reminder letter in the mail two months in advance. If you don't receive a letter, please call our office to schedule the follow-up appointment.   Any Other Special Instructions Will Be Listed Below (If Applicable).     If you need a refill on your cardiac medications before your next appointment, please call your pharmacy.

## 2015-10-25 NOTE — Progress Notes (Signed)
primary care physician: Dr. Alphonsus Sias  HPI  81 yo with history of CAD presents for cardiology followup. She had an MI in 5/08 with PCI to ramus and RCA.  She has a chronic pattern of occasional nonexertional chest pain which requires occasional use of sublingual nitroglycerin. She had an echocardiogram done in 2013 which showed normal LV systolic function with mild mitral and aortic insufficiency. She was seen in early 2014 due to  increased chest pain and dyspnea. She underwent a pharmacologic nuclear stress test  which showed no evidence of ischemia with normal ejection fraction. There was a fixed basal lateral wall defect likely due to a prior small infarct. She was noted to have faint carotid bruits. Carotid duplex showed mild to moderate disease bilaterally.   she fell in  June and fractured her hip. She was treated conservatively and was discharged to rehabilitation. She was readmitted a few with dizziness. She did not have syncope. She is not home continues to be independent. She continues to have lower back pain but denies any chest pain or shortness of breath.  Allergies  Allergen Reactions  . Atorvastatin Other (See Comments)    Reaction:  Blisters on feet   . Diltiazem Hcl Other (See Comments)    Reaction:  Unknown   . Dye Fdc Red [Red Dye] Other (See Comments)    Reaction:  Unknown   . Gemfibrozil Other (See Comments)    Reaction:  Unknown   . Niacin Other (See Comments)    Reaction:  Unknown   . Sulfasalazine Rash     Current Outpatient Prescriptions on File Prior to Visit  Medication Sig Dispense Refill  . acetaminophen (TYLENOL) 325 MG tablet Take 2 tablets (650 mg total) by mouth every 6 (six) hours as needed for mild pain (or Fever >/= 101). 60 tablet 6  . aspirin EC 81 MG tablet Take 81 mg by mouth daily.    . carvedilol (COREG) 3.125 MG tablet Take 1 tablet (3.125 mg total) by mouth 2 (two) times daily. 180 tablet 3  . HYDROcodone-acetaminophen (NORCO/VICODIN) 5-325  MG per tablet Take 1 tablet by mouth every 6 (six) hours as needed for moderate pain. 60 tablet 0  . isosorbide mononitrate (IMDUR) 60 MG 24 hr tablet Take 1 tablet (60 mg total) by mouth daily. 90 tablet 3  . lisinopril (PRINIVIL,ZESTRIL) 10 MG tablet TAKE ONE TABLET BY MOUTH ONCE DAILY 30 tablet 3  . MEGARED OMEGA-3 KRILL OIL 500 MG CAPS Take 1 capsule by mouth 2 (two) times daily.    Marland Kitchen MELATONIN PO Take 1 tablet by mouth at bedtime.    . mometasone (NASONEX) 50 MCG/ACT nasal spray Place 2 sprays into the nose as needed.    . Multiple Vitamins-Minerals (MULTIVITAMIN PO) Take 1 tablet by mouth daily.    . nitroGLYCERIN (NITROSTAT) 0.4 MG SL tablet Place 1 tablet (0.4 mg total) under the tongue every 5 (five) minutes as needed for chest pain. 25 tablet 3  . rosuvastatin (CRESTOR) 5 MG tablet Take 1 tablet (5 mg total) by mouth daily. 90 tablet 3  . traMADol (ULTRAM) 50 MG tablet Take 50 mg by mouth every 6 (six) hours as needed. for pain  0   No current facility-administered medications on file prior to visit.     Past Medical History  Diagnosis Date  . CAD (coronary artery disease)     a. S/P previous MI;  b. 04/2007 Cath/PCI: Taxus DES' to mid/distal RCA and RPDA  as well as Ramus;  c. myoview 8/09: EF 78%, normal perfusion  . Hypertension   . Osteoporosis   . Peripheral vascular disease (HCC)   . Hyperlipidemia     Intolerant of many statins  . Chronic shoulder pain   . Chronic back pain   . Osteoarthritis   . Neuropathy (HCC)   . Mild mitral and aortic regurgitation     a. 11/2011 Echo: EF 55-65%, No RWMA, Gr 1 DD, Mild AI/MR.  . Carotid stenosis     Bilateral, mild to moderate  . Pelvic fracture (HCC) 6/16     Past Surgical History  Procedure Laterality Date  . Carotid u/s  10/2006    0-39% bilaterally  . Vesicovaginal fistula closure w/ tah    . Coronary stent placement  2002    MI, [facility]  . Coronary angioplasty  12/2002    Medical rx only  . Breast biopsy       Bilateral  . Nm myoview ltd  11/2004    Stress, negative; NL EF  . Cp admit  09/2005    Stress myoview negative  . Coronary angioplasty  01/2006    LAD/PAD disease  . Adenosine myoview  01/2006    No ischemia, EF 70%  . Cataract extraction  09/2006    Right  . Coronary angioplasty  03/2007    3 vessel disease  . Coronary stent placement  04/2007    RCA/LCX multiple, [facility]  . Femoral hernia repair  05/2007    Incarcerated, right  . Fracture surgery  03/2009    Left leg     Family History  Problem Relation Age of Onset  . Pulmonary embolism Mother   . Heart disease Father   . Heart disease Sister   . Cancer Brother   . Heart disease Sister      Social History   Social History  . Marital Status: Widowed    Spouse Name: N/A  . Number of Children: 3  . Years of Education: N/A   Occupational History  . Not on file.   Social History Main Topics  . Smoking status: Never Smoker   . Smokeless tobacco: Never Used  . Alcohol Use: No  . Drug Use: No  . Sexual Activity: Not on file   Other Topics Concern  . Not on file   Social History Narrative   Widowed 2005 after long time care for husband after stroke   Remarried but seperated   Has 3 children      No formal living will   Daughter [name] should be her health care POA   Has DNR already   No feeding tube if cognitively unaware     PHYSICAL EXAM   BP 100/60 mmHg  Pulse 72  Ht 5' 1.5" (1.562 m)  Wt 115 lb 8 oz (52.39 kg)  BMI 21.47 kg/m2 Constitutional: She is oriented to person, place, and time. She appears well-developed and well-nourished. No distress.  HENT: No nasal discharge.  Head: Normocephalic and atraumatic.  Eyes: Pupils are equal and round. Right eye exhibits no discharge. Left eye exhibits no discharge.  Neck: Normal range of motion. Neck supple. No JVD present. No thyromegaly present. Left carotid bruit Cardiovascular: Normal rate, regular rhythm, normal heart sounds. Exam reveals no gallop  and no friction rub. There is 2/6 SEM at aortic area.  Pulmonary/Chest: Effort normal and breath sounds normal. No stridor. No respiratory distress. She has no wheezes. She has no rales. She  exhibits no tenderness.  Abdominal: Soft. Bowel sounds are normal. She exhibits no distension. There is no tenderness. There is no rebound and no guarding.  Musculoskeletal: Normal range of motion. She exhibits +1 edema worse on the left side and no tenderness.  Neurological: She is alert and oriented to person, place, and time. Coordination normal.  Skin: Skin is warm and dry. No rash noted. She is not diaphoretic. No erythema. No pallor.  Psychiatric: She has a normal mood and affect. Her behavior is normal. Judgment and thought content normal.      ASSESSMENT AND PLAN

## 2015-10-25 NOTE — Assessment & Plan Note (Signed)
Given her advanced age, I do not recommend any further follow-up carotid Doppler unless she is symptomatic.

## 2015-10-29 ENCOUNTER — Telehealth: Payer: Self-pay | Admitting: *Deleted

## 2015-10-29 ENCOUNTER — Telehealth: Payer: Self-pay

## 2015-10-29 ENCOUNTER — Other Ambulatory Visit: Payer: Self-pay

## 2015-10-29 ENCOUNTER — Other Ambulatory Visit: Payer: Self-pay | Admitting: *Deleted

## 2015-10-29 MED ORDER — ISOSORBIDE MONONITRATE ER 30 MG PO TB24: 30.0000 mg | ORAL_TABLET | Freq: Every day | ORAL | Status: DC

## 2015-10-29 MED ORDER — NITROGLYCERIN 0.4 MG SL SUBL: 0.4000 mg | SUBLINGUAL_TABLET | SUBLINGUAL | Status: DC | PRN

## 2015-10-29 NOTE — Telephone Encounter (Signed)
Pt daughter called, states that pt medications should be called to [facility]. She also has some questions regarding pt medications. Please call.

## 2015-10-29 NOTE — Telephone Encounter (Signed)
°*  STAT* If patient is at the pharmacy, call can be transferred to refill team.   1. Which medications need to be refilled? (please list name of each medication and dose if known) Nitro   2. Which pharmacy/location (including street and city if local pharmacy) is medication to be sent to? [facility]  3. Do they need a 30 day or 90 day supply? 90 Day

## 2015-10-29 NOTE — Telephone Encounter (Signed)
S/w pt daughter who states [facility] notified pt that prescription ready for pick up. Daughter is unsure what prescription they have filled. States Imdur should be sent to [facility]. Prescription re-sent as requested.

## 2015-10-29 NOTE — Telephone Encounter (Signed)
Rx Sent  

## 2015-10-29 NOTE — Telephone Encounter (Signed)
Spoke with patients daughter and she stated that they had requested at the recent office visit that the nitro be sent to [facility]. It was sent to [facility], but she stated that it will be free through [facility]. I will send to [facility].

## 2016-02-22 IMAGING — MR MR HEAD W/O CM
9 of 10 series · 40 of 48 positions shown · non-contrast
Comparison: CT head from the same day.

CLINICAL DATA: The patient awoke at 7 a.m. today would dizziness,
nausea, and left-sided weakness.

EXAM:
MRI HEAD WITHOUT CONTRAST
TECHNIQUE: Multiplanar, multiecho pulse sequences of the brain and surrounding
structures were obtained without intravenous contrast.

[Series 2: T1 · sagittal · 5.0mm · 0.45mm/px · 5 of 27 slices shown]
[im 1/27]
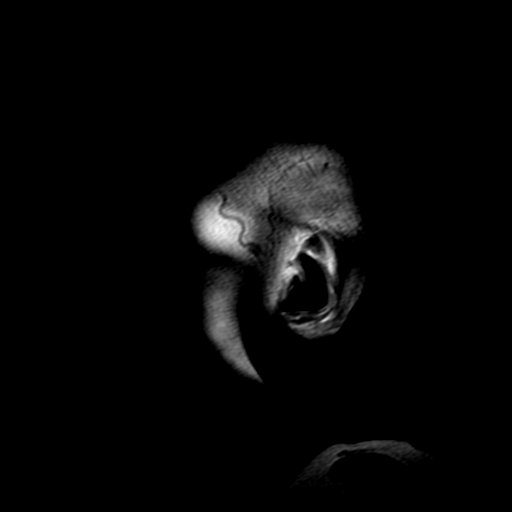
[im 7/27]
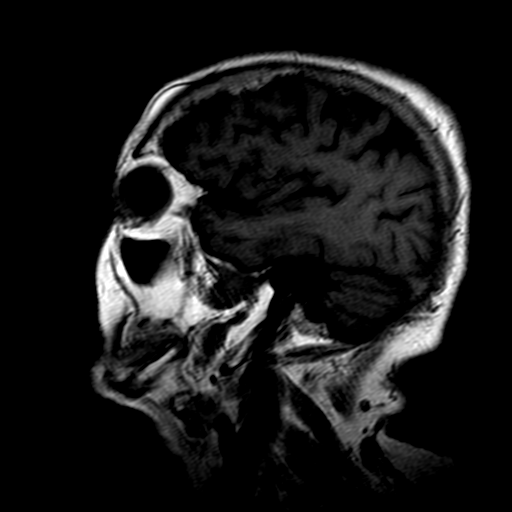
[im 14/27]
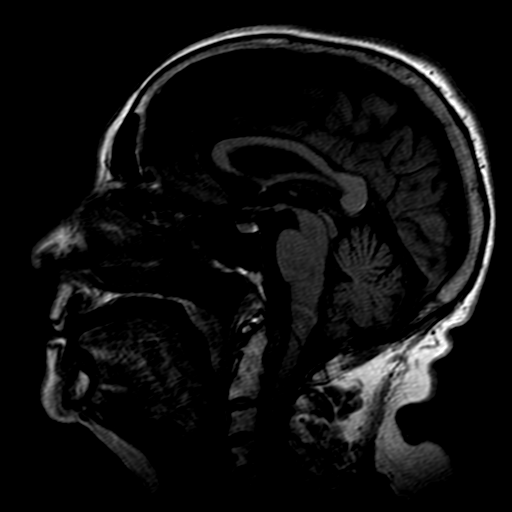
[im 20/27]
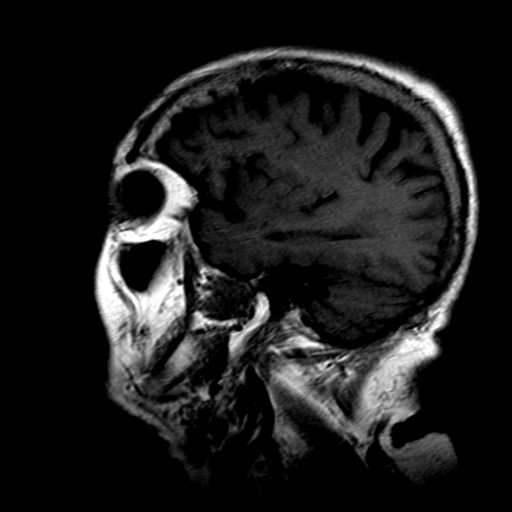
[im 27/27]
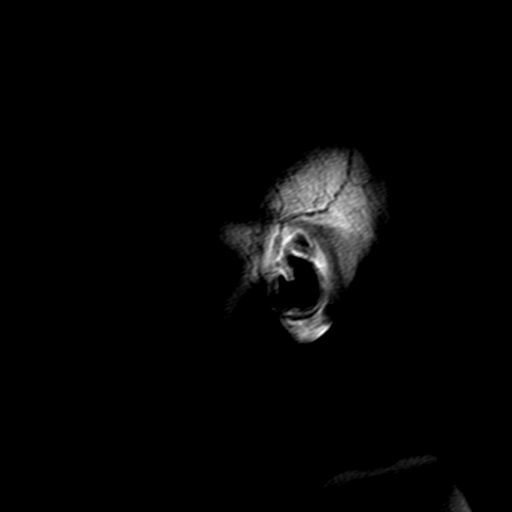

[Series 4: DWI · axial · 4.0mm · 0.94mm/px · z∈[-55,+103]mm · 6 of 44 slices shown (1 of 4)]
[im 1/44]
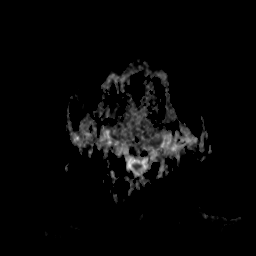
[im 9/44]
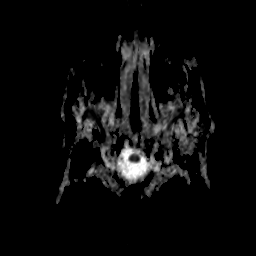
[im 18/44]
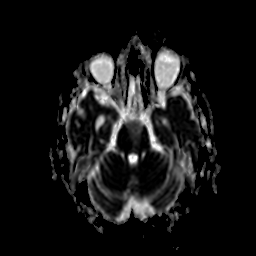
[im 26/44]
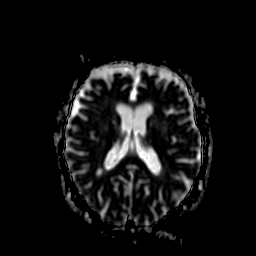
[im 35/44]
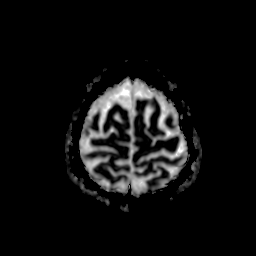
[im 44/44]
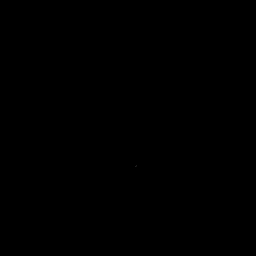

[Series 5: DWI · axial · 4.0mm · 0.94mm/px · z∈[-55,+99]mm · 6 of 42 slices shown (2 of 4)]
[im 1/42]
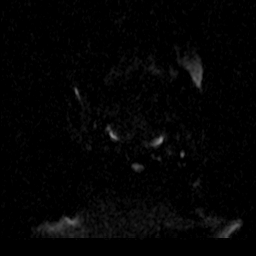
[im 9/42]
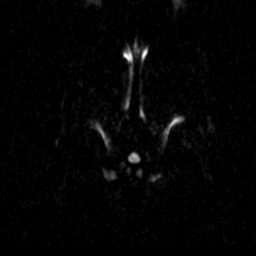
[im 17/42]
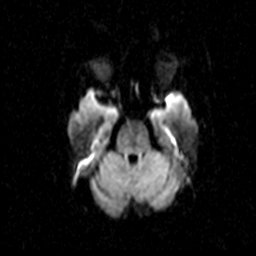
[im 25/42]
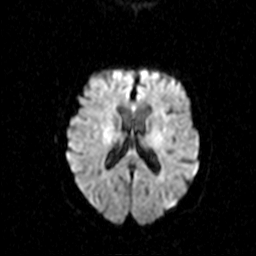
[im 33/42]
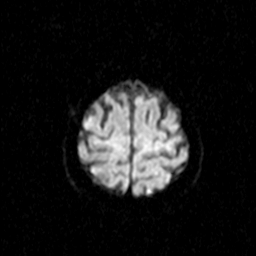
[im 42/42]
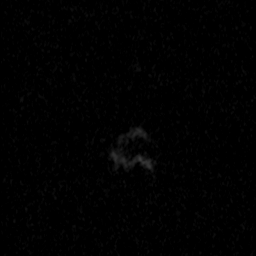

[Series 7: DWI · coronal · 5.0mm · 1.80mm/px · 5 of 39 slices shown (3 of 4)]
[im 1/39]
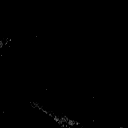
[im 10/39]
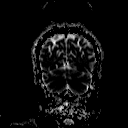
[im 20/39]
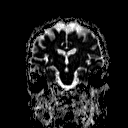
[im 29/39]
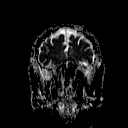
[im 39/39]
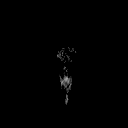

[Series 8: DWI · coronal · 5.0mm · 1.80mm/px · 5 of 39 slices shown (4 of 4)]
[im 1/39]
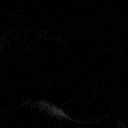
[im 10/39]
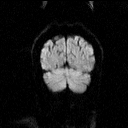
[im 20/39]
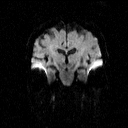
[im 29/39]
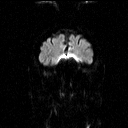
[im 39/39]
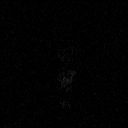

[Series 9: T2 · axial · 5.0mm · 0.45mm/px · z∈[-45,+98]mm · 3 of 25 slices shown (1 of 3)]
[im 1/25]
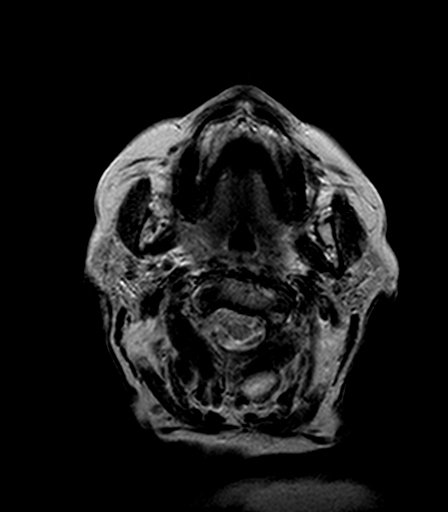
[im 13/25]
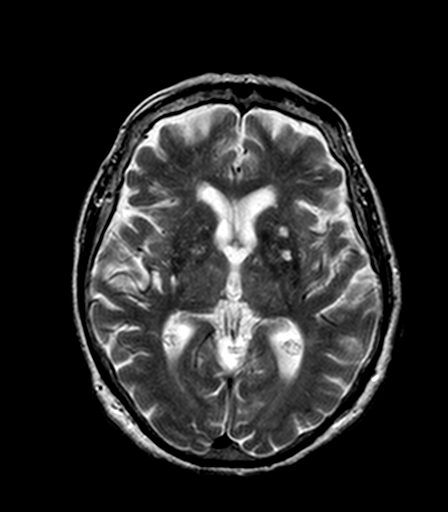
[im 25/25]
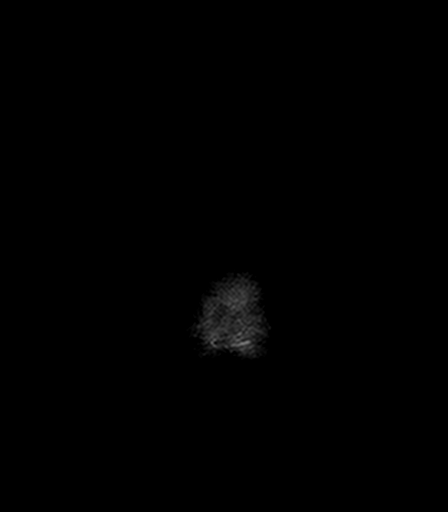

[Series 10: FLAIR · axial · 5.0mm · 0.90mm/px · z∈[-45,+98]mm · 3 of 25 slices shown]
[im 1/25]
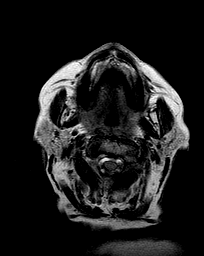
[im 13/25]
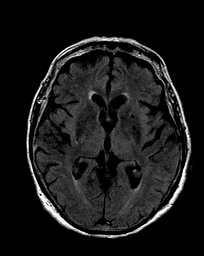
[im 25/25]
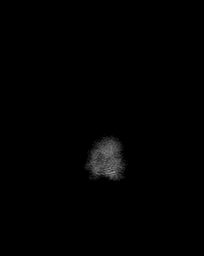

[Series 11: T2 · axial · 5.0mm · 0.45mm/px · z∈[-45,+98]mm · 3 of 25 slices shown (2 of 3)]
[im 1/25]
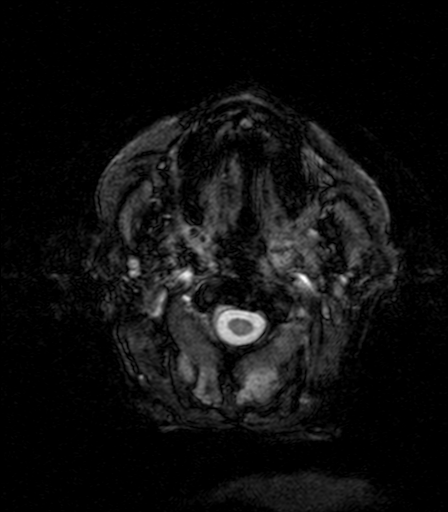
[im 13/25]
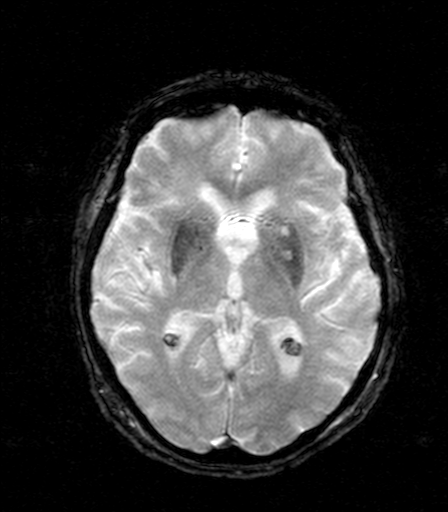
[im 25/25]
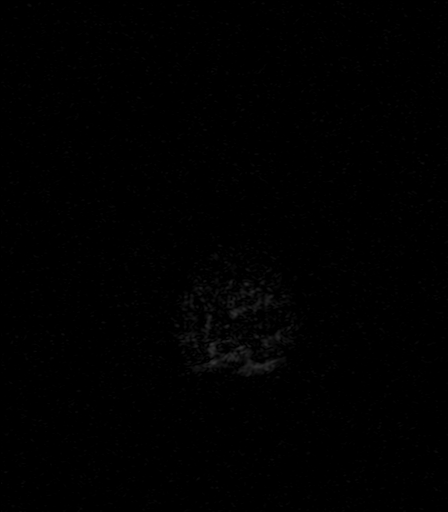

[Series 13: T2 · coronal · 5.0mm · 0.45mm/px · 4 of 29 slices shown (3 of 3)]
[im 1/29]
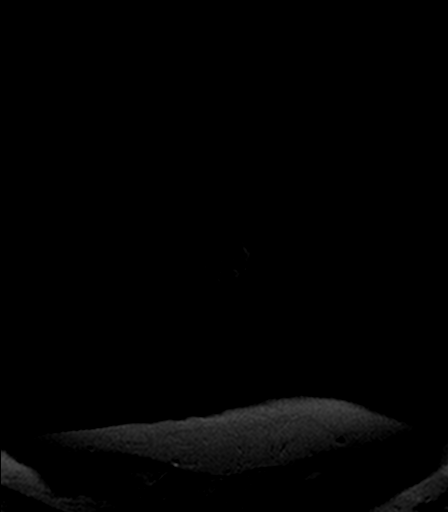
[im 10/29]
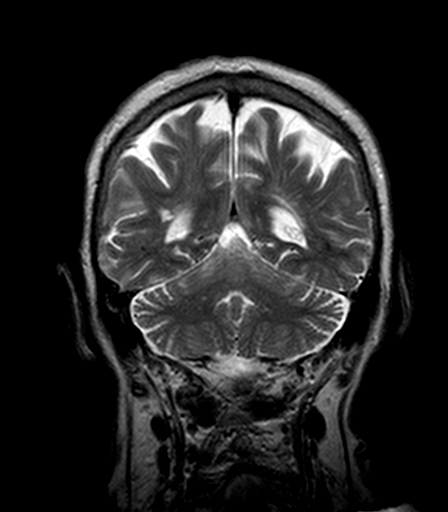
[im 19/29]
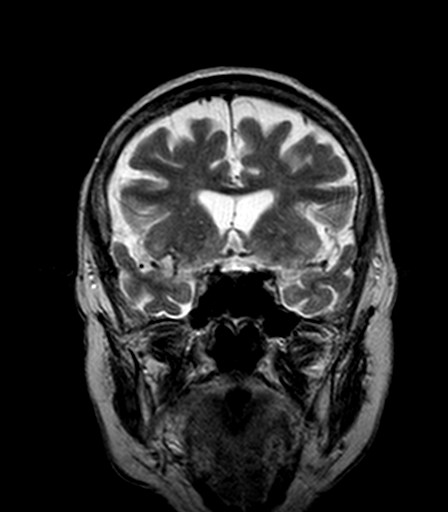
[im 29/29]
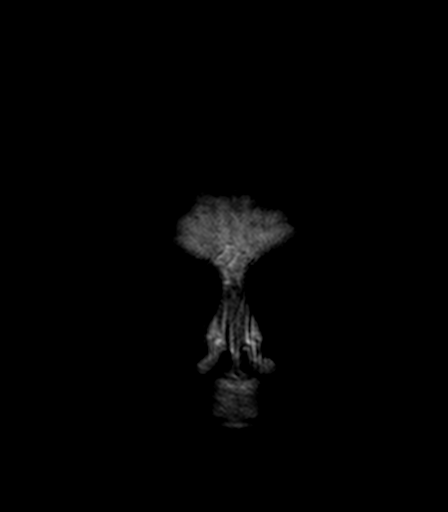

[40 of 48 positions shown; findings below may reference images not displayed]

FINDINGS: The diffusion-weighted images demonstrate no evidence for acute or
subacute infarction. Remote lacunar infarcts of the left lentiform
nucleus are stable. No acute hemorrhage or mass lesion is present.
Smaller remote lacunar infarcts of the right lentiform nucleus are
noted. Minimal atrophy is within normal limits for age. Moderate
periventricular T2 changes are noted bilaterally.

Flow is present in the major intracranial arteries. A right lens
replacement is noted. The globes and orbits are otherwise intact.
The paranasal sinuses and mastoid air cells are clear.

Previously noted left supraorbital soft tissue swelling is near
completely resolved. A small lipoma within the right supraorbital
scalp is stable.

Skullbase is unremarkable. Midline structures are within normal
limits.
IMPRESSION: 1. No acute intracranial abnormality.
2. Remote lacunar infarcts of the basal ganglia bilaterally.
3. Moderate diffuse white matter changes. These likely reflect the
sequela of chronic microvascular ischemia.
4. Resolving left supraorbital scalp soft tissue swelling.

## 2016-03-06 ENCOUNTER — Encounter: Payer: Self-pay | Admitting: Internal Medicine

## 2016-03-06 ENCOUNTER — Ambulatory Visit (INDEPENDENT_AMBULATORY_CARE_PROVIDER_SITE_OTHER): Payer: Medicare Other | Admitting: Internal Medicine

## 2016-03-06 VITALS — BP 138/82 | HR 60 | Temp 97.7°F | Wt 117.0 lb

## 2016-03-06 DIAGNOSIS — I779 Disorder of arteries and arterioles, unspecified: Secondary | ICD-10-CM | POA: Diagnosis not present

## 2016-03-06 DIAGNOSIS — I25119 Atherosclerotic heart disease of native coronary artery with unspecified angina pectoris: Secondary | ICD-10-CM | POA: Diagnosis not present

## 2016-03-06 DIAGNOSIS — E440 Moderate protein-calorie malnutrition: Secondary | ICD-10-CM | POA: Insufficient documentation

## 2016-03-06 DIAGNOSIS — E441 Mild protein-calorie malnutrition: Secondary | ICD-10-CM

## 2016-03-06 DIAGNOSIS — F39 Unspecified mood [affective] disorder: Secondary | ICD-10-CM

## 2016-03-06 DIAGNOSIS — I739 Peripheral vascular disease, unspecified: Secondary | ICD-10-CM

## 2016-03-06 NOTE — Progress Notes (Signed)
Subjective:    Patient ID: Christina [name], female    DOB: 06/25/1920, 82 y.o.   MRN: [id number]  HPI Here for follow up of chronic health conditions Daughter [name] is here Still mourning son who died recently  Family helps with yard work--and she hires for some of that Doesn't drive Daughter takes her for shopping and picks up stuff  Cooks a little, does light housework and laundry  No recent chest pain No sublingual nitro use lately Still with DOE--- not clearly different  Still gets dizziness when she gets up No syncope No edema  Has some left low back pain---like near spine No pain from pelvis now--not sure if that is related Tylenol helps some  Current Outpatient Prescriptions on File Prior to Visit  Medication Sig Dispense Refill  . acetaminophen (TYLENOL) 325 MG tablet Take 2 tablets (650 mg total) by mouth every 6 (six) hours as needed for mild pain (or Fever >/= 101). 60 tablet 6  . aspirin EC 81 MG tablet Take 81 mg by mouth daily.    . carvedilol (COREG) 3.125 MG tablet Take 1 tablet (3.125 mg total) by mouth 2 (two) times daily. 180 tablet 3  . gabapentin (NEURONTIN) 300 MG capsule Take 300 mg by mouth at bedtime.    Marland Kitchen HYDROcodone-acetaminophen (NORCO/VICODIN) 5-325 MG per tablet Take 1 tablet by mouth every 6 (six) hours as needed for moderate pain. 60 tablet 0  . isosorbide mononitrate (IMDUR) 30 MG 24 hr tablet Take 1 tablet (30 mg total) by mouth daily. 90 tablet 3  . lisinopril (PRINIVIL,ZESTRIL) 10 MG tablet TAKE ONE TABLET BY MOUTH ONCE DAILY 30 tablet 3  . MEGARED OMEGA-3 KRILL OIL 500 MG CAPS Take 1 capsule by mouth 2 (two) times daily.    Marland Kitchen MELATONIN PO Take 1 tablet by mouth at bedtime.    . mometasone (NASONEX) 50 MCG/ACT nasal spray Place 2 sprays into the nose as needed.    . Multiple Vitamins-Minerals (MULTIVITAMIN PO) Take 1 tablet by mouth daily.    . nitroGLYCERIN (NITROSTAT) 0.4 MG SL tablet Place 1 tablet (0.4 mg total) under the tongue every 5  (five) minutes as needed for chest pain. 100 tablet 1  . rosuvastatin (CRESTOR) 5 MG tablet Take 1 tablet (5 mg total) by mouth daily. 90 tablet 3   No current facility-administered medications on file prior to visit.    Allergies  Allergen Reactions  . Atorvastatin Other (See Comments)    Reaction:  Blisters on feet   . Diltiazem Hcl Other (See Comments)    Reaction:  Unknown   . Dye Fdc Red [Red Dye] Other (See Comments)    Reaction:  Unknown   . Gemfibrozil Other (See Comments)    Reaction:  Unknown   . Niacin Other (See Comments)    Reaction:  Unknown   . Sulfasalazine Rash    Past Medical History  Diagnosis Date  . CAD (coronary artery disease)     a. S/P previous MI;  b. 04/2007 Cath/PCI: Taxus DES' to mid/distal RCA and RPDA as well as Ramus;  c. myoview 8/09: EF 78%, normal perfusion  . Hypertension   . Osteoporosis   . Peripheral vascular disease (HCC)   . Hyperlipidemia     Intolerant of many statins  . Chronic shoulder pain   . Chronic back pain   . Osteoarthritis   . Neuropathy (HCC)   . Mild mitral and aortic regurgitation     a. 11/2011  Echo: EF 55-65%, No RWMA, Gr 1 DD, Mild AI/MR.  . Carotid stenosis     Bilateral, mild to moderate  . Pelvic fracture (HCC) 6/16    Past Surgical History  Procedure Laterality Date  . Carotid u/s  10/2006    0-39% bilaterally  . Vesicovaginal fistula closure w/ tah    . Coronary stent placement  2002    MI, [facility]  . Coronary angioplasty  12/2002    Medical rx only  . Breast biopsy      Bilateral  . Nm myoview ltd  11/2004    Stress, negative; NL EF  . Cp admit  09/2005    Stress myoview negative  . Coronary angioplasty  01/2006    LAD/PAD disease  . Adenosine myoview  01/2006    No ischemia, EF 70%  . Cataract extraction  09/2006    Right  . Coronary angioplasty  03/2007    3 vessel disease  . Coronary stent placement  04/2007    RCA/LCX multiple, [facility]  . Femoral hernia repair  05/2007    Incarcerated,  right  . Fracture surgery  03/2009    Left leg    Family History  Problem Relation Age of Onset  . Pulmonary embolism Mother   . Heart disease Father   . Heart disease Sister   . Cancer Brother   . Heart disease Sister     Social History   Social History  . Marital Status: Widowed    Spouse Name: N/A  . Number of Children: 3  . Years of Education: N/A   Occupational History  . Not on file.   Social History Main Topics  . Smoking status: Never Smoker   . Smokeless tobacco: Never Used  . Alcohol Use: No  . Drug Use: No  . Sexual Activity: Not on file   Other Topics Concern  . Not on file   Social History Narrative   Widowed 2005 after long time care for husband after stroke   Remarried but seperated   Has 3 children      No formal living will   Daughter Carney BernJean should be her health care POA   Has DNR already   No feeding tube if cognitively unaware   Review of Systems Not sleeping well--- using benedryl. Discussed concerns with this. Appetite is not good---weight down a pound. Eats better when not alone Intermittent depression--not daily. Not anhedonic Husband still comes over daily    Objective:   Physical Exam  Constitutional: She appears well-developed. No distress.  Neck: Normal range of motion. Neck supple.  Cardiovascular: Normal rate, regular rhythm and normal heart sounds.  Exam reveals no gallop.   No murmur heard. Pulmonary/Chest: Effort normal and breath sounds normal. No respiratory distress. She has no wheezes. She has no rales.  Musculoskeletal: She exhibits no edema.  No spine tenderness. Mild left lower back tenderness (paraspinal)  Lymphadenopathy:    She has no cervical adenopathy.  Psychiatric: She has a normal mood and affect. Her behavior is normal.          Assessment & Plan:

## 2016-03-06 NOTE — Assessment & Plan Note (Signed)
No symptoms No further testing

## 2016-03-06 NOTE — Assessment & Plan Note (Signed)
Still mourning son Has good family support Dysthymia but not MDD--no meds

## 2016-03-06 NOTE — Progress Notes (Signed)
Pre visit review using our clinic review tool, if applicable. No additional management support is needed unless otherwise documented below in the visit note. 

## 2016-03-06 NOTE — Assessment & Plan Note (Signed)
Asked her to take boost or ensure daily

## 2016-03-06 NOTE — Assessment & Plan Note (Signed)
Limited exercise tolerance DOE may be anginal equivalent so will continue isosorbide (despite dizziness)

## 2016-05-01 ENCOUNTER — Encounter: Payer: Self-pay | Admitting: Cardiovascular Disease

## 2016-05-01 ENCOUNTER — Ambulatory Visit (INDEPENDENT_AMBULATORY_CARE_PROVIDER_SITE_OTHER): Payer: Medicare Other | Admitting: Cardiovascular Disease

## 2016-05-01 VITALS — BP 154/72 | HR 60 | Ht 60.0 in | Wt 114.5 lb

## 2016-05-01 DIAGNOSIS — I25119 Atherosclerotic heart disease of native coronary artery with unspecified angina pectoris: Secondary | ICD-10-CM

## 2016-05-01 DIAGNOSIS — I10 Essential (primary) hypertension: Secondary | ICD-10-CM | POA: Diagnosis not present

## 2016-05-01 MED ORDER — MECLIZINE HCL 25 MG PO TABS: 25.0000 mg | ORAL_TABLET | Freq: Three times a day (TID) | ORAL | Status: DC | PRN

## 2016-05-01 NOTE — Patient Instructions (Addendum)
Medication Instructions: Start Antivert 25 mg 3 times daily as needed for dizziness  Labwork: None.   Procedures/Testing: None.   Follow-Up: 6 months with Dr. Kirke CorinArida.   Any Additional Special Instructions Will Be Listed Below (If Applicable).     If you need a refill on your cardiac medications before your next appointment, please call your pharmacy.

## 2016-05-01 NOTE — Progress Notes (Signed)
Cardiology Office Note   Date:  05/01/2016   ID:  Christina [name], DOB 1920-07-17, MRN [id number]  PCP:  Tillman Abide, MD  Cardiologist:   Lorine Bears, MD   Chief Complaint  Patient presents with  . other    6 month f/u c/o dizziness and leg cramping. Meds reviewed verbally with pt.      History of Present Illness: Christina [name] is a 82 y.o. female who presents for a followup visit regarding coronary artery disease. She had an MI in 5/08 with PCI to ramus and RCA.  She has a chronic pattern of occasional nonexertional chest pain which requires occasional use of sublingual nitroglycerin. She had an echocardiogram done in 2013 which showed normal LV systolic function with mild mitral and aortic insufficiency. Nuclear stress test in 2014 showed no evidence of ischemia with normal ejection fraction. There was a fixed basal lateral wall defect likely due to a prior small infarct. She was noted to have faint carotid bruits. Carotid duplex showed mild to moderate disease bilaterally.  she fell in  June, 2016 and fractured her hip. She was treated conservatively.  She has been doing reasonably well and denies chest pain . She continues to have significant exertional dyspnea. She continues to be active and walks regularly and her guardian. She has chronic back pain since her fall last year. She continues to complain of dizziness described as spinning sensation especially at night. She also has lightheadedness when she stands up.  Past Medical History  Diagnosis Date  . CAD (coronary artery disease)     a. S/P previous MI;  b. 04/2007 Cath/PCI: Taxus DES' to mid/distal RCA and RPDA as well as Ramus;  c. myoview 8/09: EF 78%, normal perfusion  . Hypertension   . Osteoporosis   . Peripheral vascular disease (HCC)   . Hyperlipidemia     Intolerant of many statins  . Chronic shoulder pain   . Chronic back pain   . Osteoarthritis   . Neuropathy (HCC)   . Mild mitral and aortic regurgitation      a. 11/2011 Echo: EF 55-65%, No RWMA, Gr 1 DD, Mild AI/MR.  . Carotid stenosis     Bilateral, mild to moderate  . Pelvic fracture (HCC) 6/16    Past Surgical History  Procedure Laterality Date  . Carotid u/s  10/2006    0-39% bilaterally  . Vesicovaginal fistula closure w/ tah    . Coronary stent placement  2002    MI, [facility]  . Coronary angioplasty  12/2002    Medical rx only  . Breast biopsy      Bilateral  . Nm myoview ltd  11/2004    Stress, negative; NL EF  . Cp admit  09/2005    Stress myoview negative  . Coronary angioplasty  01/2006    LAD/PAD disease  . Adenosine myoview  01/2006    No ischemia, EF 70%  . Cataract extraction  09/2006    Right  . Coronary angioplasty  03/2007    3 vessel disease  . Coronary stent placement  04/2007    RCA/LCX multiple, [facility]  . Femoral hernia repair  05/2007    Incarcerated, right  . Fracture surgery  03/2009    Left leg     Current Outpatient Prescriptions  Medication Sig Dispense Refill  . acetaminophen (TYLENOL) 325 MG tablet Take 2 tablets (650 mg total) by mouth every 6 (six) hours as needed for mild pain (or  Fever >/= 101). 60 tablet 6  . aspirin EC 81 MG tablet Take 81 mg by mouth daily.    . carvedilol (COREG) 3.125 MG tablet Take 1 tablet (3.125 mg total) by mouth 2 (two) times daily. 180 tablet 3  . diphenhydrAMINE (BENADRYL) 25 MG tablet Take 25 mg by mouth at bedtime as needed.    . gabapentin (NEURONTIN) 300 MG capsule Take 300 mg by mouth 3 (three) times daily.     Marland Kitchen. HYDROcodone-acetaminophen (NORCO/VICODIN) 5-325 MG per tablet Take 1 tablet by mouth every 6 (six) hours as needed for moderate pain. 60 tablet 0  . isosorbide mononitrate (IMDUR) 30 MG 24 hr tablet Take 1 tablet (30 mg total) by mouth daily. 90 tablet 3  . lisinopril (PRINIVIL,ZESTRIL) 10 MG tablet TAKE ONE TABLET BY MOUTH ONCE DAILY 30 tablet 3  . MEGARED OMEGA-3 KRILL OIL 500 MG CAPS Take 1 capsule by mouth daily.     Marland Kitchen. MELATONIN PO Take 1  tablet by mouth at bedtime.    . Multiple Vitamins-Minerals (MULTIVITAMIN PO) Take 1 tablet by mouth daily.    . nitroGLYCERIN (NITROSTAT) 0.4 MG SL tablet Place 1 tablet (0.4 mg total) under the tongue every 5 (five) minutes as needed for chest pain. 100 tablet 1  . rosuvastatin (CRESTOR) 5 MG tablet Take 1 tablet (5 mg total) by mouth daily. 90 tablet 3   No current facility-administered medications for this visit.    Allergies:   Atorvastatin; Diltiazem hcl; Dye fdc red; Gemfibrozil; Niacin; and Sulfasalazine    Social History:  The patient  reports that she has never smoked. She has never used smokeless tobacco. She reports that she does not drink alcohol or use illicit drugs.   Family History:  The patient's family history includes Cancer in her brother; Heart disease in her father, sister, and sister; Pulmonary embolism in her mother.    ROS:  Please see the history of present illness.   Otherwise, review of systems are positive for none.   All other systems are reviewed and negative.    PHYSICAL EXAM: VS:  Ht 5' (1.524 m)  Wt 114 lb 8 oz (51.937 kg)  BMI 22.36 kg/m2 , BMI Body mass index is 22.36 kg/(m^2). GEN: Well nourished, well developed, in no acute distress HEENT: normal Neck: no JVD, carotid bruits, or masses Cardiac: RRR; no murmurs, rubs, or gallops,no edema  Respiratory:  clear to auscultation bilaterally, normal work of breathing GI: soft, nontender, nondistended, + BS MS: no deformity or atrophy Skin: warm and dry, no rash Neuro:  Strength and sensation are intact Psych: euthymic mood, full affect   EKG:  EKG is ordered today. The ekg ordered today demonstrates normal sinus rhythm with left ventricular hypertrophy.   Recent Labs: 07/02/2015: ALT 13* 07/03/2015: BUN 19; Creatinine, Ser 0.75; Hemoglobin 11.5*; Platelets 225; Potassium 4.0; Sodium 144    Lipid Panel    Component Value Date/Time   CHOL 113 07/02/2015 0805   TRIG 102 07/02/2015 0805   HDL  39* 07/02/2015 0805   CHOLHDL 2.9 07/02/2015 0805   VLDL 20 07/02/2015 0805   LDLCALC 54 07/02/2015 0805   LDLDIRECT 77.8 01/28/2010 1058      Wt Readings from Last 3 Encounters:  05/01/16 114 lb 8 oz (51.937 kg)  03/06/16 117 lb (53.071 kg)  10/25/15 115 lb 8 oz (52.39 kg)        ASSESSMENT AND PLAN:  1.  Coronary artery disease involving native coronary arteries with  other forms of angina: The patient has significant exertional dyspnea without chest pain. Angina equivalent cannot be completely excluded. Her symptoms have been relatively stable and thus I elected to keep her on the same medications.  2. Carotid artery disease: Continue medical therapy. I only recommend revascularization if she is symptomatic.  3. Dizziness: She has symptoms suggestive of vertigo and other symptoms suggestive of orthostatic hypotension. I prescribed meclizine to be used as needed.  4. Essential hypertension: Blood pressure is elevated today. I did not increase any of her medications due to chronic dizziness.    Disposition:   FU with me in 6 months  Signed,  Lorine Bears, MD  05/01/2016 11:29 AM    [facility]

## 2016-05-06 ENCOUNTER — Other Ambulatory Visit: Payer: Self-pay | Admitting: Internal Medicine

## 2016-05-17 ENCOUNTER — Encounter: Payer: Self-pay | Admitting: Emergency Medicine

## 2016-05-17 ENCOUNTER — Inpatient Hospital Stay
Admission: EM | Admit: 2016-05-17 | Discharge: 2016-05-22 | DRG: 482 | Disposition: A | Discharge status: Skilled Nursing Facility | Payer: Medicare Other | Attending: Internal Medicine | Admitting: Internal Medicine

## 2016-05-17 ENCOUNTER — Emergency Department: Payer: Medicare Other

## 2016-05-17 DIAGNOSIS — Z01810 Encounter for preprocedural cardiovascular examination: Secondary | ICD-10-CM | POA: Diagnosis not present

## 2016-05-17 DIAGNOSIS — R079 Chest pain, unspecified: Secondary | ICD-10-CM | POA: Diagnosis not present

## 2016-05-17 DIAGNOSIS — M810 Age-related osteoporosis without current pathological fracture: Secondary | ICD-10-CM | POA: Diagnosis present

## 2016-05-17 DIAGNOSIS — Y93K1 Activity, walking an animal: Secondary | ICD-10-CM

## 2016-05-17 DIAGNOSIS — M1990 Unspecified osteoarthritis, unspecified site: Secondary | ICD-10-CM | POA: Diagnosis present

## 2016-05-17 DIAGNOSIS — S63259A Unspecified dislocation of unspecified finger, initial encounter: Secondary | ICD-10-CM

## 2016-05-17 DIAGNOSIS — E785 Hyperlipidemia, unspecified: Secondary | ICD-10-CM | POA: Diagnosis present

## 2016-05-17 DIAGNOSIS — I739 Peripheral vascular disease, unspecified: Secondary | ICD-10-CM | POA: Diagnosis present

## 2016-05-17 DIAGNOSIS — Z882 Allergy status to sulfonamides status: Secondary | ICD-10-CM

## 2016-05-17 DIAGNOSIS — Y92014 Private driveway to single-family (private) house as the place of occurrence of the external cause: Secondary | ICD-10-CM

## 2016-05-17 DIAGNOSIS — Z9841 Cataract extraction status, right eye: Secondary | ICD-10-CM

## 2016-05-17 DIAGNOSIS — Z7401 Bed confinement status: Secondary | ICD-10-CM | POA: Diagnosis not present

## 2016-05-17 DIAGNOSIS — W010XXA Fall on same level from slipping, tripping and stumbling without subsequent striking against object, initial encounter: Secondary | ICD-10-CM | POA: Diagnosis present

## 2016-05-17 DIAGNOSIS — M25519 Pain in unspecified shoulder: Secondary | ICD-10-CM | POA: Diagnosis present

## 2016-05-17 DIAGNOSIS — Z8249 Family history of ischemic heart disease and other diseases of the circulatory system: Secondary | ICD-10-CM

## 2016-05-17 DIAGNOSIS — I252 Old myocardial infarction: Secondary | ICD-10-CM | POA: Diagnosis not present

## 2016-05-17 DIAGNOSIS — S299XXA Unspecified injury of thorax, initial encounter: Secondary | ICD-10-CM | POA: Diagnosis not present

## 2016-05-17 DIAGNOSIS — S72141A Displaced intertrochanteric fracture of right femur, initial encounter for closed fracture: Secondary | ICD-10-CM | POA: Diagnosis not present

## 2016-05-17 DIAGNOSIS — G629 Polyneuropathy, unspecified: Secondary | ICD-10-CM | POA: Diagnosis not present

## 2016-05-17 DIAGNOSIS — Z66 Do not resuscitate: Secondary | ICD-10-CM | POA: Diagnosis not present

## 2016-05-17 DIAGNOSIS — G8929 Other chronic pain: Secondary | ICD-10-CM | POA: Diagnosis present

## 2016-05-17 DIAGNOSIS — Z79899 Other long term (current) drug therapy: Secondary | ICD-10-CM

## 2016-05-17 DIAGNOSIS — M25551 Pain in right hip: Secondary | ICD-10-CM | POA: Diagnosis not present

## 2016-05-17 DIAGNOSIS — Z955 Presence of coronary angioplasty implant and graft: Secondary | ICD-10-CM | POA: Diagnosis not present

## 2016-05-17 DIAGNOSIS — Z809 Family history of malignant neoplasm, unspecified: Secondary | ICD-10-CM | POA: Diagnosis not present

## 2016-05-17 DIAGNOSIS — S63272A Dislocation of unspecified interphalangeal joint of right middle finger, initial encounter: Secondary | ICD-10-CM | POA: Diagnosis not present

## 2016-05-17 DIAGNOSIS — S63278A Dislocation of unspecified interphalangeal joint of other finger, initial encounter: Secondary | ICD-10-CM | POA: Diagnosis present

## 2016-05-17 DIAGNOSIS — I2510 Atherosclerotic heart disease of native coronary artery without angina pectoris: Secondary | ICD-10-CM | POA: Diagnosis present

## 2016-05-17 DIAGNOSIS — S72001A Fracture of unspecified part of neck of right femur, initial encounter for closed fracture: Secondary | ICD-10-CM | POA: Diagnosis not present

## 2016-05-17 DIAGNOSIS — S72141D Displaced intertrochanteric fracture of right femur, subsequent encounter for closed fracture with routine healing: Secondary | ICD-10-CM | POA: Diagnosis not present

## 2016-05-17 DIAGNOSIS — S72009A Fracture of unspecified part of neck of unspecified femur, initial encounter for closed fracture: Secondary | ICD-10-CM

## 2016-05-17 DIAGNOSIS — I10 Essential (primary) hypertension: Secondary | ICD-10-CM | POA: Diagnosis present

## 2016-05-17 DIAGNOSIS — M549 Dorsalgia, unspecified: Secondary | ICD-10-CM | POA: Diagnosis present

## 2016-05-17 DIAGNOSIS — R279 Unspecified lack of coordination: Secondary | ICD-10-CM | POA: Diagnosis not present

## 2016-05-17 DIAGNOSIS — Z7982 Long term (current) use of aspirin: Secondary | ICD-10-CM

## 2016-05-17 DIAGNOSIS — R293 Abnormal posture: Secondary | ICD-10-CM | POA: Diagnosis not present

## 2016-05-17 DIAGNOSIS — Z9181 History of falling: Secondary | ICD-10-CM | POA: Diagnosis not present

## 2016-05-17 DIAGNOSIS — Z419 Encounter for procedure for purposes other than remedying health state, unspecified: Secondary | ICD-10-CM

## 2016-05-17 DIAGNOSIS — Z9889 Other specified postprocedural states: Secondary | ICD-10-CM

## 2016-05-17 DIAGNOSIS — M25559 Pain in unspecified hip: Secondary | ICD-10-CM | POA: Diagnosis not present

## 2016-05-17 DIAGNOSIS — Z888 Allergy status to other drugs, medicaments and biological substances status: Secondary | ICD-10-CM

## 2016-05-17 DIAGNOSIS — I080 Rheumatic disorders of both mitral and aortic valves: Secondary | ICD-10-CM | POA: Diagnosis present

## 2016-05-17 DIAGNOSIS — T426X5A Adverse effect of other antiepileptic and sedative-hypnotic drugs, initial encounter: Secondary | ICD-10-CM | POA: Diagnosis present

## 2016-05-17 DIAGNOSIS — Z79891 Long term (current) use of opiate analgesic: Secondary | ICD-10-CM

## 2016-05-17 DIAGNOSIS — M6281 Muscle weakness (generalized): Secondary | ICD-10-CM | POA: Diagnosis not present

## 2016-05-17 DIAGNOSIS — R4182 Altered mental status, unspecified: Secondary | ICD-10-CM | POA: Diagnosis present

## 2016-05-17 DIAGNOSIS — S63252D Unspecified dislocation of right middle finger, subsequent encounter: Secondary | ICD-10-CM | POA: Diagnosis not present

## 2016-05-17 DIAGNOSIS — S63282A Dislocation of proximal interphalangeal joint of right middle finger, initial encounter: Secondary | ICD-10-CM | POA: Diagnosis not present

## 2016-05-17 DIAGNOSIS — I25119 Atherosclerotic heart disease of native coronary artery with unspecified angina pectoris: Secondary | ICD-10-CM | POA: Diagnosis not present

## 2016-05-17 DIAGNOSIS — R262 Difficulty in walking, not elsewhere classified: Secondary | ICD-10-CM | POA: Diagnosis not present

## 2016-05-17 DIAGNOSIS — W19XXXA Unspecified fall, initial encounter: Secondary | ICD-10-CM | POA: Diagnosis not present

## 2016-05-17 LAB — CBC
HEMATOCRIT: 36 % (ref 35.0–47.0)
Hemoglobin: 12.3 g/dL (ref 12.0–16.0)
MCH: 32.2 pg (ref 26.0–34.0)
MCHC: 34.1 g/dL (ref 32.0–36.0)
MCV: 94.2 fL (ref 80.0–100.0)
PLATELETS: 173 10*3/uL (ref 150–440)
RBC: 3.83 MIL/uL (ref 3.80–5.20)
RDW: 13.7 % (ref 11.5–14.5)
WBC: 10 10*3/uL (ref 3.6–11.0)

## 2016-05-17 LAB — BASIC METABOLIC PANEL
ANION GAP: 7 (ref 5–15)
BUN: 23 mg/dL — ABNORMAL HIGH (ref 6–20)
CALCIUM: 9.9 mg/dL (ref 8.9–10.3)
CO2: 28 mmol/L (ref 22–32)
CREATININE: 0.8 mg/dL (ref 0.44–1.00)
Chloride: 107 mmol/L (ref 101–111)
GFR calc Af Amer: >60 mL/min (ref >60)
GLUCOSE: 103 mg/dL — AB (ref 65–99)
Potassium: 4.1 mmol/L (ref 3.5–5.1)
Sodium: 142 mmol/L (ref 135–145)

## 2016-05-17 LAB — ALBUMIN: Albumin: 3.7 g/dL (ref 3.5–5.0)

## 2016-05-17 LAB — TYPE AND SCREEN
ABO/RH(D): O POS
ANTIBODY IDENTIFICATION: NOT DETECTED
Antibody Screen: POSITIVE

## 2016-05-17 LAB — SURGICAL PCR SCREEN
MRSA, PCR: NEGATIVE
Staphylococcus aureus: NEGATIVE

## 2016-05-17 LAB — PROTIME-INR
INR: 1.09
Prothrombin Time: 14.3 seconds (ref 11.4–15.0)

## 2016-05-17 LAB — ABO/RH: ABO/RH(D): O POS

## 2016-05-17 LAB — APTT: aPTT: 50 seconds — ABNORMAL HIGH (ref 24–36)

## 2016-05-17 MED ORDER — SORBITOL 70 % SOLN
30.0000 mL | Freq: Every day | Status: DC | PRN
  Administered 2016-05-20: 30 mL via ORAL
  Filled 2016-05-17 (×4): qty 30

## 2016-05-17 MED ORDER — HYDROCODONE-ACETAMINOPHEN 5-325 MG PO TABS
1.0000 | ORAL_TABLET | Freq: Four times a day (QID) | ORAL | Status: DC | PRN
  Administered 2016-05-19 – 2016-05-20 (×3): 1 via ORAL
  Filled 2016-05-17 (×3): qty 1

## 2016-05-17 MED ORDER — SODIUM CHLORIDE 0.9% FLUSH
3.0000 mL | INTRAVENOUS | Status: DC | PRN
  Administered 2016-05-21: 3 mL via INTRAVENOUS
  Filled 2016-05-17: qty 3

## 2016-05-17 MED ORDER — ASPIRIN EC 81 MG PO TBEC
81.0000 mg | DELAYED_RELEASE_TABLET | Freq: Every day | ORAL | Status: DC
  Administered 2016-05-19 – 2016-05-22 (×4): 81 mg via ORAL
  Filled 2016-05-17 (×4): qty 1

## 2016-05-17 MED ORDER — ADULT MULTIVITAMIN W/MINERALS CH
1.0000 | ORAL_TABLET | Freq: Every day | ORAL | Status: DC
  Administered 2016-05-19 – 2016-05-22 (×4): 1 via ORAL
  Filled 2016-05-17 (×4): qty 1

## 2016-05-17 MED ORDER — NITROGLYCERIN 0.4 MG SL SUBL: 0.4000 mg | SUBLINGUAL_TABLET | SUBLINGUAL | Status: DC | PRN

## 2016-05-17 MED ORDER — SODIUM CHLORIDE 0.9% FLUSH
3.0000 mL | Freq: Two times a day (BID) | INTRAVENOUS | Status: DC
  Administered 2016-05-17 – 2016-05-21 (×6): 3 mL via INTRAVENOUS

## 2016-05-17 MED ORDER — GABAPENTIN 300 MG PO CAPS
300.0000 mg | ORAL_CAPSULE | Freq: Three times a day (TID) | ORAL | Status: DC
  Administered 2016-05-18 – 2016-05-21 (×7): 300 mg via ORAL
  Filled 2016-05-17 (×7): qty 1

## 2016-05-17 MED ORDER — ACETAMINOPHEN 325 MG PO TABS
650.0000 mg | ORAL_TABLET | Freq: Four times a day (QID) | ORAL | Status: DC | PRN
  Administered 2016-05-21 – 2016-05-22 (×3): 650 mg via ORAL
  Filled 2016-05-17 (×4): qty 2

## 2016-05-17 MED ORDER — MORPHINE SULFATE (PF) 2 MG/ML IV SOLN
2.0000 mg | INTRAVENOUS | Status: DC | PRN
  Administered 2016-05-17 – 2016-05-18 (×6): 2 mg via INTRAVENOUS
  Filled 2016-05-17 (×6): qty 1

## 2016-05-17 MED ORDER — SODIUM CHLORIDE 0.9 % IV SOLN: 250.0000 mL | INTRAVENOUS | Status: DC | PRN

## 2016-05-17 MED ORDER — MORPHINE SULFATE (PF) 2 MG/ML IV SOLN
2.0000 mg | Freq: Once | INTRAVENOUS | Status: AC
  Administered 2016-05-17: 2 mg via INTRAVENOUS
  Filled 2016-05-17: qty 1

## 2016-05-17 MED ORDER — ONDANSETRON HCL 4 MG/2ML IJ SOLN
4.0000 mg | Freq: Four times a day (QID) | INTRAMUSCULAR | Status: DC | PRN
  Administered 2016-05-17 – 2016-05-18 (×2): 4 mg via INTRAVENOUS
  Filled 2016-05-17 (×2): qty 2

## 2016-05-17 MED ORDER — ISOSORBIDE MONONITRATE ER 30 MG PO TB24
30.0000 mg | ORAL_TABLET | Freq: Every day | ORAL | Status: DC
  Administered 2016-05-21 – 2016-05-22 (×2): 30 mg via ORAL
  Filled 2016-05-17 (×3): qty 1

## 2016-05-17 MED ORDER — CARVEDILOL 3.125 MG PO TABS
3.1250 mg | ORAL_TABLET | Freq: Two times a day (BID) | ORAL | Status: DC
  Administered 2016-05-18 – 2016-05-22 (×4): 3.125 mg via ORAL
  Filled 2016-05-17 (×6): qty 1

## 2016-05-17 MED ORDER — OXYCODONE HCL 5 MG PO TABS
5.0000 mg | ORAL_TABLET | ORAL | Status: DC | PRN
  Administered 2016-05-17 – 2016-05-19 (×4): 10 mg via ORAL
  Administered 2016-05-20 – 2016-05-21 (×2): 5 mg via ORAL
  Filled 2016-05-17 (×3): qty 2
  Filled 2016-05-17: qty 1
  Filled 2016-05-17: qty 2
  Filled 2016-05-17: qty 1

## 2016-05-17 MED ORDER — SODIUM CHLORIDE 0.9 % IV BOLUS (SEPSIS)
500.0000 mL | Freq: Once | INTRAVENOUS | Status: AC
  Administered 2016-05-17: 500 mL via INTRAVENOUS

## 2016-05-17 MED ORDER — LIDOCAINE HCL (PF) 1 % IJ SOLN
INTRAMUSCULAR | Status: AC
  Filled 2016-05-17: qty 10

## 2016-05-17 MED ORDER — MECLIZINE HCL 25 MG PO TABS: 25.0000 mg | ORAL_TABLET | Freq: Three times a day (TID) | ORAL | Status: DC | PRN

## 2016-05-17 MED ORDER — ONDANSETRON HCL 4 MG PO TABS: 4.0000 mg | ORAL_TABLET | Freq: Four times a day (QID) | ORAL | Status: DC | PRN

## 2016-05-17 MED ORDER — PENTAFLUOROPROP-TETRAFLUOROETH EX AERO
INHALATION_SPRAY | CUTANEOUS | Status: AC
  Filled 2016-05-17: qty 30

## 2016-05-17 MED ORDER — ROSUVASTATIN CALCIUM 10 MG PO TABS
5.0000 mg | ORAL_TABLET | Freq: Every day | ORAL | Status: DC
  Administered 2016-05-18 – 2016-05-21 (×4): 5 mg via ORAL
  Filled 2016-05-17 (×4): qty 1

## 2016-05-17 MED ORDER — LISINOPRIL 10 MG PO TABS
10.0000 mg | ORAL_TABLET | Freq: Every day | ORAL | Status: DC
  Administered 2016-05-22: 10 mg via ORAL
  Filled 2016-05-17 (×3): qty 1

## 2016-05-17 MED ORDER — OMEGA-3-ACID ETHYL ESTERS 1 G PO CAPS
1.0000 | ORAL_CAPSULE | Freq: Every day | ORAL | Status: DC
  Administered 2016-05-19 – 2016-05-22 (×4): 1 g via ORAL
  Filled 2016-05-17 (×4): qty 1

## 2016-05-17 NOTE — ED Notes (Addendum)
Pt to ed via ems from home with reports of having a fall this am while out in the driveway while taking her dog out. Pt fell injuring her right hip, right elbow and right third finger. Right third finger noted with bruising and is unable to bend at this time. Hematoma noted to right elbow, right leg noted to have some shortening and rotation, pedal pulse present. No acute distress noted at this time pt a/ox3, edp at bedside on arrival.   20g IV est by ems, vss, pt with hx of MIx3 and has had 6 stents placed.

## 2016-05-17 NOTE — Consult Note (Signed)
ORTHOPAEDIC CONSULTATION  REQUESTING PHYSICIAN: Ramonita Lab, MD  Chief Complaint: Right hip pain status post fall  HPI: Christina [name] is a 82 y.o. female who complains of  right hip pain after fall at home today. She is seen in her hospital room with her son and grandsons at the bedside. Patient is reported to have fallen in today while trying to pick up a small dog that belonged to her son. This was a mechanical fall and not related to a syncopal episode. Patient has mild to moderate right hip pain. She is having nausea and vomiting currently. Patient states that she has baseline neuropathy for which she takes Neurontin.  Past Medical History  Diagnosis Date  . CAD (coronary artery disease)     a. S/P previous MI;  b. 04/2007 Cath/PCI: Taxus DES' to mid/distal RCA and RPDA as well as Ramus;  c. myoview 8/09: EF 78%, normal perfusion  . Hypertension   . Osteoporosis   . Peripheral vascular disease (HCC)   . Hyperlipidemia     Intolerant of many statins  . Chronic shoulder pain   . Chronic back pain   . Osteoarthritis   . Neuropathy (HCC)   . Mild mitral and aortic regurgitation     a. 11/2011 Echo: EF 55-65%, No RWMA, Gr 1 DD, Mild AI/MR.  . Carotid stenosis     Bilateral, mild to moderate  . Pelvic fracture (HCC) 6/16   Past Surgical History  Procedure Laterality Date  . Carotid u/s  10/2006    0-39% bilaterally  . Vesicovaginal fistula closure w/ tah    . Coronary stent placement  2002    MI, [facility]  . Coronary angioplasty  12/2002    Medical rx only  . Breast biopsy      Bilateral  . Nm myoview ltd  11/2004    Stress, negative; NL EF  . Cp admit  09/2005    Stress myoview negative  . Coronary angioplasty  01/2006    LAD/PAD disease  . Adenosine myoview  01/2006    No ischemia, EF 70%  . Cataract extraction  09/2006    Right  . Coronary angioplasty  03/2007    3 vessel disease  . Coronary stent placement  04/2007    RCA/LCX multiple, [facility]  . Femoral hernia  repair  05/2007    Incarcerated, right  . Fracture surgery  03/2009    Left leg   Social History   Social History  . Marital Status: Widowed    Spouse Name: N/A  . Number of Children: 3  . Years of Education: N/A   Social History Main Topics  . Smoking status: Never Smoker   . Smokeless tobacco: Never Used  . Alcohol Use: No  . Drug Use: No  . Sexual Activity: Not Asked   Other Topics Concern  . 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 Pulmonary embolism Mother   . Heart disease Father   . Heart disease Sister   . Cancer Brother   . Heart disease Sister    Allergies  Allergen Reactions  . Atorvastatin Other (  See Comments)    Reaction:  Blisters on feet   . Diltiazem Hcl Other (See Comments)    Reaction:  Unknown   . Dye Fdc Red [Red Dye] Other (See Comments)    Reaction:  Unknown   . Gemfibrozil Other (See Comments)    Reaction:  Unknown   . Niacin Other (See Comments)    Reaction:  Unknown   . Sulfasalazine Rash   Prior to Admission medications   Medication Sig Start Date End Date Taking? Authorizing Provider  acetaminophen (TYLENOL) 325 MG tablet Take 2 tablets (650 mg total) by mouth every 6 (six) hours as needed for mild pain (or Fever >/= 101). 06/11/15  Yes Gale Journeyatherine P Walsh, MD  aspirin EC 81 MG tablet Take 81 mg by mouth daily.   Yes Historical Provider, MD  carvedilol (COREG) 3.125 MG tablet Take 1 tablet by mouth two  times daily 05/08/16  Yes Karie Schwalbeichard I Letvak, MD  gabapentin (NEURONTIN) 300 MG capsule Take 300 mg by mouth 3 (three) times daily.    Yes Historical Provider, MD  isosorbide mononitrate (IMDUR) 30 MG 24 hr tablet Take 1 tablet (30 mg total) by mouth daily.  10/29/15  Yes [place] OuchMuhammad A Arida, MD  lisinopril (PRINIVIL,ZESTRIL) 10 MG tablet TAKE ONE TABLET BY MOUTH ONCE DAILY 08/20/15  Yes [place] OuchMuhammad A Arida, MD  meclizine (ANTIVERT) 25 MG tablet Take 1 tablet (25 mg total) by mouth 3 (three) times daily as needed for dizziness. 05/01/16  Yes [place] OuchMuhammad A Arida, MD  MEGARED OMEGA-3 KRILL OIL 500 MG CAPS Take 1 capsule by mouth daily.    Yes Historical Provider, MD  Multiple Vitamins-Minerals (MULTIVITAMIN PO) Take 1 tablet by mouth daily.   Yes Historical Provider, MD  nitroGLYCERIN (NITROSTAT) 0.4 MG SL tablet Place 1 tablet (0.4 mg total) under the tongue every 5 (five) minutes as needed for chest pain. 10/29/15  Yes [place] OuchMuhammad A Arida, MD  rosuvastatin (CRESTOR) 5 MG tablet Take 1 tablet by mouth  daily 05/08/16  Yes Karie Schwalbeichard I Letvak, MD  HYDROcodone-acetaminophen (NORCO/VICODIN) 5-325 MG per tablet Take 1 tablet by mouth every 6 (six) hours as needed for moderate pain. Patient not taking: Reported on 05/17/2016 07/26/15   Karie Schwalbeichard I Letvak, MD   Dg Chest Portable 1 View  05/17/2016  CLINICAL DATA:  Pain following fall EXAM: PORTABLE CHEST 1 VIEW COMPARISON:  July 02, 2015 FINDINGS: There is no edema or consolidation. Heart size and pulmonary vascularity are normal. No adenopathy. No pneumothorax. There is degenerative change in the thoracic spine. No fracture evident. IMPRESSION: No edema or consolidation.  No demonstrable pneumothorax. Electronically Signed   By: Bretta BangWilliam  Woodruff III M.D.   On: 05/17/2016 11:17   Dg Hand Complete Right  05/17/2016  CLINICAL DATA:  Pain following fall EXAM: RIGHT HAND - COMPLETE 3+ VIEW COMPARISON:  None. FINDINGS: Frontal, oblique, and lateral views were obtained. There is no acute fracture. There is evidence of old trauma involving the distal radius. There is dislocation at the third PIP joint with the third middle and distal phalanges dislocated dorsally and slightly medial to the third proximal phalanx. No other dislocations. There  is extensive osteoarthritic change at essentially all joints throughout the hand and wrist. No erosive change or bony destruction. IMPRESSION: Dislocation at the third PIP joint. No acute fracture. Fairly extensive osteoarthritic change at all joints. No erosive change. Evidence of old healed trauma involving the distal radius. Electronically Signed   By: Bretta BangWilliam  Woodruff III M.D.  On: 05/17/2016 11:16   Dg Hip Unilat With Pelvis 2-3 Views Right  05/17/2016  CLINICAL DATA:  Fall today with right hip pain.  Initial encounter. EXAM: DG HIP (WITH OR WITHOUT PELVIS) 2-3V RIGHT COMPARISON:  Left hip series 16109 FINDINGS: There is an intertrochanteric right femur fracture with mild extension into the sub trochanteric region. A lesser trochanter fragment is mildly displaced medially. The femoral head remains approximated with the acetabulum. No focal osseous lesion is identified. Pelvic phleboliths and vascular calcifications are noted. IMPRESSION: Mildly displaced intertrochanteric right femur fracture. Electronically Signed   By: Sebastian Ache M.D.   On: 05/17/2016 11:15    Positive ROS: All other systems have been reviewed and were otherwise negative with the exception of those mentioned in the HPI and as above.  Physical Exam: General: Alert, no acute distress, episode of vomiting during encounter.  MUSCULOSKELETAL: Right lower extremity: Patient has shortening and external rotation of the right lower extremity. Her skin is intact. She has palpable pedal pulses. She states that she can feel me touching her feet with light touch. She can flex and extend her toes and both lower extremities.  Assessment: Right displaced intertrochanteric hip fracture  Plan: I explained to the patient and her family that she has sustained a right hip fracture. They understand that it is displaced. I recommended intramedullary fixation for this fracture. I explained to the patient and her family the details of this  operation and the postoperative course. They understand without surgery the patient will likely not be able to stand or ambulate. She would also have significant pain with movement of the right lower extremity without surgery. I am recommending surgery for pain control as well as to give her the chance to return to standing and ambulating. We discussed the risks of surgery as well. They understand the risks include infection, bleeding requiring blood transfusion, nerve or blood vessel injury, leg length discrepancy and change in lower extremity rotation, malunion, nonunion, hardware failure, persistent right hip pain and the need for further surgery. Medical risks include but are not limited to DVT and pulmonary embolism, myocardial infarction, stroke, pneumonia, respiratory failure and death. Family understood these risks and wished to proceed with surgery.  I spoke with Dr. Amado Coe from the hospitalist service. Given the patient's cardiac history, she has requested cardiac consultation for surgical clearance. Patient is scheduled for surgery tomorrow morning pending medical clearance. I reviewed the patient's laboratories and radiographic studies in preparation for this case. I answered all questions by the patient and her family.   [name], MD    05/17/2016 6:19 PM

## 2016-05-17 NOTE — ED Provider Notes (Signed)
[facility] Emergency Department Provider Note  ____________________________________________  Time seen: Approximately 10:28 AM  I have reviewed the triage vital signs and the nursing notes.   HISTORY  Chief Complaint No chief complaint on file.  Right hip pain  HPI Christina [name] is a 82 y.o. female presents for evaluation of right hip pain after a fall.  Patient went to pick up her dog, she fell forward and landed on her side. She did not strike her head, lose consciousness or injure her head, neck chest. She reports that she is having moderate pain in the right hip at rest, and severe pain at the hip when she tries to move it and she has not been to walk. She also has slight bruising in her right hand, but denies pain.  No fevers chills or recent illness. Rates a moderate achy sharp pain in the right hip.   Past Medical History  Diagnosis Date  . CAD (coronary artery disease)     a. S/P previous MI;  b. 04/2007 Cath/PCI: Taxus DES' to mid/distal RCA and RPDA as well as Ramus;  c. myoview 8/09: EF 78%, normal perfusion  . Hypertension   . Osteoporosis   . Peripheral vascular disease (HCC)   . Hyperlipidemia     Intolerant of many statins  . Chronic shoulder pain   . Chronic back pain   . Osteoarthritis   . Neuropathy (HCC)   . Mild mitral and aortic regurgitation     a. 11/2011 Echo: EF 55-65%, No RWMA, Gr 1 DD, Mild AI/MR.  . Carotid stenosis     Bilateral, mild to moderate  . Pelvic fracture (HCC) 6/16    Patient Active Problem List   Diagnosis Date Noted  . Mild malnutrition (HCC) 03/06/2016  . Mood disorder (HCC) 09/06/2015  . Back pain without sciatica 07/26/2015  . Dizziness 07/05/2015  . Osteoarthritis, multiple sites 03/04/2015  . Advanced directives, counseling/discussion 09/04/2014  . Mild cognitive impairment 09/04/2014  . Routine general medical examination at a health care facility 08/26/2013  . Carotid arterial disease (HCC)    . Situational stress 02/12/2012  . Hyperlipidemia 12/04/2007  . Essential hypertension 06/04/2007  . Coronary artery disease with unspecified angina pectoris 06/04/2007  . Osteoporosis 06/04/2007    Past Surgical History  Procedure Laterality Date  . Carotid u/s  10/2006    0-39% bilaterally  . Vesicovaginal fistula closure w/ tah    . Coronary stent placement  2002    MI, [facility]  . Coronary angioplasty  12/2002    Medical rx only  . Breast biopsy      Bilateral  . Nm myoview ltd  11/2004    Stress, negative; NL EF  . Cp admit  09/2005    Stress myoview negative  . Coronary angioplasty  01/2006    LAD/PAD disease  . Adenosine myoview  01/2006    No ischemia, EF 70%  . Cataract extraction  09/2006    Right  . Coronary angioplasty  03/2007    3 vessel disease  . Coronary stent placement  04/2007    RCA/LCX multiple, [facility]  . Femoral hernia repair  05/2007    Incarcerated, right  . Fracture surgery  03/2009    Left leg    Current Outpatient Rx  Name  Route  Sig  Dispense  Refill  . acetaminophen (TYLENOL) 325 MG tablet   Oral   Take 2 tablets (650 mg total) by mouth every 6 (six)  hours as needed for mild pain (or Fever >/= 101).   60 tablet   6   . aspirin EC 81 MG tablet   Oral   Take 81 mg by mouth daily.         . carvedilol (COREG) 3.125 MG tablet      Take 1 tablet by mouth two  times daily   180 tablet   1   . diphenhydrAMINE (BENADRYL) 25 MG tablet   Oral   Take 25 mg by mouth at bedtime as needed.         . gabapentin (NEURONTIN) 300 MG capsule   Oral   Take 300 mg by mouth 3 (three) times daily.          Marland Kitchen HYDROcodone-acetaminophen (NORCO/VICODIN) 5-325 MG per tablet   Oral   Take 1 tablet by mouth every 6 (six) hours as needed for moderate pain.   60 tablet   0   . isosorbide mononitrate (IMDUR) 30 MG 24 hr tablet   Oral   Take 1 tablet (30 mg total) by mouth daily.   90 tablet   3   . lisinopril (PRINIVIL,ZESTRIL) 10 MG  tablet      TAKE ONE TABLET BY MOUTH ONCE DAILY   30 tablet   3   . lisinopril (PRINIVIL,ZESTRIL) 10 MG tablet      Take 1 tablet by mouth  daily   90 tablet   1   . meclizine (ANTIVERT) 25 MG tablet   Oral   Take 1 tablet (25 mg total) by mouth 3 (three) times daily as needed for dizziness.   30 tablet   3   . MEGARED OMEGA-3 KRILL OIL 500 MG CAPS   Oral   Take 1 capsule by mouth daily.          . Multiple Vitamins-Minerals (MULTIVITAMIN PO)   Oral   Take 1 tablet by mouth daily.         . nitroGLYCERIN (NITROSTAT) 0.4 MG SL tablet   Sublingual   Place 1 tablet (0.4 mg total) under the tongue every 5 (five) minutes as needed for chest pain.   100 tablet   1   . rosuvastatin (CRESTOR) 5 MG tablet      Take 1 tablet by mouth  daily   90 tablet   1     Allergies Atorvastatin; Diltiazem hcl; Dye fdc red; Gemfibrozil; Niacin; and Sulfasalazine  Family History  Problem Relation Age of Onset  . Pulmonary embolism Mother   . Heart disease Father   . Heart disease Sister   . Cancer Brother   . Heart disease Sister     Social History Social History  Substance Use Topics  . Smoking status: Never Smoker   . Smokeless tobacco: Never Used  . Alcohol Use: No    Review of Systems Constitutional: No fever/chills Eyes: No visual changes. ENT: No sore throat. Cardiovascular: Denies chest pain. Respiratory: Denies shortness of breath. Gastrointestinal: No abdominal pain.  No nausea, no vomiting.  No diarrhea.  No constipation. Genitourinary: Negative for dysuria. Musculoskeletal: Negative for back pain.No neck pain. Skin: Negative for rash. Neurological: Negative for headaches, focal weakness or numbness.  10-point ROS otherwise negative.  ____________________________________________   PHYSICAL EXAM:  VITAL SIGNS: ED Triage Vitals  Enc Vitals Group     BP --      Pulse --      Resp --      Temp --  Temp src --      SpO2 --      Weight --       Height --      Head Cir --      Peak Flow --      Pain Score --      Pain Loc --      Pain Edu? --      Excl. in GC? --    Constitutional: Alert and oriented. Well appearing and in no acute distress.Very pleasant, appears in mild to moderate pain Eyes: Conjunctivae are normal. PERRL. EOMI. Head: Atraumatic. Nose: No congestion/rhinnorhea. Mouth/Throat: Mucous membranes are moist.  Oropharynx non-erythematous. Neck: No stridor.  No cervical spine tenderness, normal and full range of motion of the neck. Cardiovascular: Normal rate, regular rhythm. Grossly normal heart sounds.  Good peripheral circulation. Respiratory: Normal respiratory effort.  No retractions. Lungs CTAB. Gastrointestinal: Soft and nontender. No distention.  Musculoskeletal:    Lower Extremities  No edema. Normal DP/PT pulses bilateral with good cap refill.  Normal neuro-motor function lower extremities bilateral.  RIGHT Right lower extremity demonstrates limitation in movement to the right hip due to severe pain. No edema bruising or contusions of the right knee, right ankle. Full range of motion of the right lower extremity without pain. No pain on axial loading. No evidence of trauma. Palpable normal dorsalis pedis and posterior tibial pulses bilaterally.   LEFT Left lower extremity demonstrates normal strength, good use of all muscles. No edema bruising or contusions of the hip,  knee, ankle. Full range of motion of the left lower extremity without pain. No pain on axial loading. No evidence of trauma.   Neurologic:  Normal speech and language. No gross focal neurologic deficits are appreciated. No gait instability. Skin:  Skin is warm, dry and intact. No rash noted. Psychiatric: Mood and affect are normal. Speech and behavior are normal.  ____________________________________________   LABS (all labs ordered are listed, but only abnormal results are displayed)  Labs Reviewed  CBC  BASIC METABOLIC  PANEL  PROTIME-INR  APTT   ____________________________________________  EKG   ____________________________________________  RADIOLOGY  DG Chest Portable 1 View (Final result) Result time: 05/17/16 11:17:40   Final result by Rad Results In Interface (05/17/16 11:17:40)   Narrative:   CLINICAL DATA: Pain following fall  EXAM: PORTABLE CHEST 1 VIEW  COMPARISON: July 02, 2015  FINDINGS: There is no edema or consolidation. Heart size and pulmonary vascularity are normal. No adenopathy. No pneumothorax. There is degenerative change in the thoracic spine. No fracture evident.  IMPRESSION: No edema or consolidation. No demonstrable pneumothorax.   Electronically Signed By: Bretta Bang III M.D. On: 05/17/2016 11:17          DG HIP UNILAT WITH PELVIS 2-3 VIEWS RIGHT (Final result) Result time: 05/17/16 11:15:18   Final result by Rad Results In Interface (05/17/16 11:15:18)   Narrative:   CLINICAL DATA: Fall today with right hip pain. Initial encounter.  EXAM: DG HIP (WITH OR WITHOUT PELVIS) 2-3V RIGHT  COMPARISON: Left hip series 16109  FINDINGS: There is an intertrochanteric right femur fracture with mild extension into the sub trochanteric region. A lesser trochanter fragment is mildly displaced medially. The femoral head remains approximated with the acetabulum. No focal osseous lesion is identified. Pelvic phleboliths and vascular calcifications are noted.  IMPRESSION: Mildly displaced intertrochanteric right femur fracture.   Electronically Signed By: Sebastian Ache M.D. On: 05/17/2016 11:15  DG Hand Complete Right (Final result) Result time: 05/17/16 11:16:33   Final result by Rad Results In Interface (05/17/16 11:16:33)   Narrative:   CLINICAL DATA: Pain following fall  EXAM: RIGHT HAND - COMPLETE 3+ VIEW  COMPARISON: None.  FINDINGS: Frontal, oblique, and lateral views were obtained. There is no  acute fracture. There is evidence of old trauma involving the distal radius. There is dislocation at the third PIP joint with the third middle and distal phalanges dislocated dorsally and slightly medial to the third proximal phalanx. No other dislocations. There is extensive osteoarthritic change at essentially all joints throughout the hand and wrist. No erosive change or bony destruction.  IMPRESSION: Dislocation at the third PIP joint. No acute fracture. Fairly extensive osteoarthritic change at all joints. No erosive change. Evidence of old healed trauma involving the distal radius.   Electronically Signed By: Bretta Bang III M.D. On: 05/17/2016 11:16    ____________________________________________   PROCEDURES  Procedure(s) performed: Third right hand phalanx PIP joint reduction  Critical Care performed: No  Reduction of dislocation Date/Time: 11:28 AM Performed by: Sharyn Creamer Authorized by: Sharyn Creamer Consent: Verbal consent obtained. Risks and benefits: risks, benefits and alternatives were discussed Consent given by: patient Required items:  Time out: Immediately prior to procedure a "time out" was called to verify the correct patient, procedure, equipment, support staff and site/side marked as required.  Patient sedated: none  Vitals: Vital signs were monitored during sedation. Patient tolerance: Patient tolerated the procedure well with no immediate complications. Joint: Right hand third digit proximal interphalangeal joint Reduction technique: Mild axial distraction, was able to reduce the joint without any difficulty. The patient tolerated, with very minimal discomfort. Capillary refill normal post, normal sensation.    ____________________________________________   INITIAL IMPRESSION / ASSESSMENT AND PLAN / ED COURSE  Pertinent labs & imaging results that were available during my care of the patient were reviewed by me and considered in my  medical decision making (see chart for details).  Patient presents after mechanical fall with right hip pain, shortening and extra rotation. X-ray consistent with along with clinical history and exam right intertrochanteric hip fracture. Addition the patient did land on her right hand, and has evidence of a right PIP finger dislocation which was reduced successfully in the ED without, location.  Patient denies any neurologic complaint. Negative Nexus. Did not strike head or injure her neck. She is fully awake alert and oriented, stable. Pain is well-controlled after 2 mg of morphine. Patient and family agreeable with plan for admission due to right hip fracture. ____________________________________________   FINAL CLINICAL IMPRESSION(S) / ED DIAGNOSES  Final diagnoses:  None      Sharyn Creamer, MD 05/17/16 [phone]

## 2016-05-17 NOTE — H&P (Addendum)
[facility] - [place] at [facility]   PATIENT NAME: Christina [name]    MR#:  [id number]  DATE OF BIRTH:  Sep 16, 1920  DATE OF ADMISSION:  05/17/2016  PRIMARY CARE PHYSICIAN: Tillman Abide, MD   REQUESTING/REFERRING PHYSICIAN: Quale  CHIEF COMPLAINT:   Fall  HISTORY OF PRESENT ILLNESS:  Christina [name]  is a 82 y.o. female with a known history of coronary artery disease, hypertension, osteoporosis and peripheral vascular disease and multiple other medical problems was brought into the ED after she sustained a fall this a.m.Patient was walking her dog on the drive way and tripped over and fell on her right side. Patient is brought into the ED x-ray has revealed right intertrochanteric fracture and right third finger PIP dislocation. Dr. Martha Clan is planning to do surgery in a.m. Patient has history of coronary artery disease and sees Dr.Arida as an outpatient but denies any chest pain or shortness of breath. Denies any dizziness either.   PAST MEDICAL HISTORY:   Past Medical History  Diagnosis Date  . CAD (coronary artery disease)     a. S/P previous MI;  b. 04/2007 Cath/PCI: Taxus DES' to mid/distal RCA and RPDA as well as Ramus;  c. myoview 8/09: EF 78%, normal perfusion  . Hypertension   . Osteoporosis   . Peripheral vascular disease (HCC)   . Hyperlipidemia     Intolerant of many statins  . Chronic shoulder pain   . Chronic back pain   . Osteoarthritis   . Neuropathy (HCC)   . Mild mitral and aortic regurgitation     a. 11/2011 Echo: EF 55-65%, No RWMA, Gr 1 DD, Mild AI/MR.  . Carotid stenosis     Bilateral, mild to moderate  . Pelvic fracture (HCC) 6/16    PAST SURGICAL HISTOIRY:   Past Surgical History  Procedure Laterality Date  . Carotid u/s  10/2006    0-39% bilaterally  . Vesicovaginal fistula closure w/ tah    . Coronary stent placement  2002    MI, [facility]  . Coronary angioplasty  12/2002    Medical rx only  . Breast biopsy      Bilateral  .  Nm myoview ltd  11/2004    Stress, negative; NL EF  . Cp admit  09/2005    Stress myoview negative  . Coronary angioplasty  01/2006    LAD/PAD disease  . Adenosine myoview  01/2006    No ischemia, EF 70%  . Cataract extraction  09/2006    Right  . Coronary angioplasty  03/2007    3 vessel disease  . Coronary stent placement  04/2007    RCA/LCX multiple, [facility]  . Femoral hernia repair  05/2007    Incarcerated, right  . Fracture surgery  03/2009    Left leg    SOCIAL HISTORY:   Social History  Substance Use Topics  . Smoking status: Never Smoker   . Smokeless tobacco: Never Used  . Alcohol Use: No    FAMILY HISTORY:   Family History  Problem Relation Age of Onset  . Pulmonary embolism Mother   . Heart disease Father   . Heart disease Sister   . Cancer Brother   . Heart disease Sister     DRUG ALLERGIES:   Allergies  Allergen Reactions  . Atorvastatin Other (See Comments)    Reaction:  Blisters on feet   . Diltiazem Hcl Other (See Comments)    Reaction:  Unknown   . Dye  Fdc Red [Red Dye] Other (See Comments)    Reaction:  Unknown   . Gemfibrozil Other (See Comments)    Reaction:  Unknown   . Niacin Other (See Comments)    Reaction:  Unknown   . Sulfasalazine Rash    REVIEW OF SYSTEMS:  CONSTITUTIONAL: No fever, fatigue or weakness.  EYES: No blurred or double vision.  EARS, NOSE, AND THROAT: No tinnitus or ear pain.  RESPIRATORY: No cough, shortness of breath, wheezing or hemoptysis.  CARDIOVASCULAR: No chest pain, orthopnea, edema.  GASTROINTESTINAL: No nausea, vomiting, diarrhea or abdominal pain.  GENITOURINARY: No dysuria, hematuria.  ENDOCRINE: No polyuria, nocturia,  HEMATOLOGY: No anemia, easy bruising or bleeding SKIN: No rash or lesion. MUSCULOSKELETAL: reporting right hip pain and right elbow pain and right hand pain    NEUROLOGIC: No tingling, numbness, weakness.  PSYCHIATRY: No anxiety or depression.   MEDICATIONS AT HOME:   Prior to  Admission medications   Medication Sig Start Date End Date Taking? Authorizing Provider  acetaminophen (TYLENOL) 325 MG tablet Take 2 tablets (650 mg total) by mouth every 6 (six) hours as needed for mild pain (or Fever >/= 101). 06/11/15  Yes Gale Journeyatherine P Walsh, MD  aspirin EC 81 MG tablet Take 81 mg by mouth daily.   Yes Historical Provider, MD  carvedilol (COREG) 3.125 MG tablet Take 1 tablet by mouth two  times daily 05/08/16  Yes Karie Schwalbeichard I Letvak, MD  gabapentin (NEURONTIN) 300 MG capsule Take 300 mg by mouth 3 (three) times daily.    Yes Historical Provider, MD  isosorbide mononitrate (IMDUR) 30 MG 24 hr tablet Take 1 tablet (30 mg total) by mouth daily. 10/29/15  Yes Iran OuchMuhammad A Arida, MD  lisinopril (PRINIVIL,ZESTRIL) 10 MG tablet TAKE ONE TABLET BY MOUTH ONCE DAILY 08/20/15  Yes [place] OuchMuhammad A Arida, MD  meclizine (ANTIVERT) 25 MG tablet Take 1 tablet (25 mg total) by mouth 3 (three) times daily as needed for dizziness. 05/01/16  Yes [place] OuchMuhammad A Arida, MD  MEGARED OMEGA-3 KRILL OIL 500 MG CAPS Take 1 capsule by mouth daily.    Yes Historical Provider, MD  Multiple Vitamins-Minerals (MULTIVITAMIN PO) Take 1 tablet by mouth daily.   Yes Historical Provider, MD  nitroGLYCERIN (NITROSTAT) 0.4 MG SL tablet Place 1 tablet (0.4 mg total) under the tongue every 5 (five) minutes as needed for chest pain. 10/29/15  Yes [place] OuchMuhammad A Arida, MD  rosuvastatin (CRESTOR) 5 MG tablet Take 1 tablet by mouth  daily 05/08/16  Yes Karie Schwalbeichard I Letvak, MD  HYDROcodone-acetaminophen (NORCO/VICODIN) 5-325 MG per tablet Take 1 tablet by mouth every 6 (six) hours as needed for moderate pain. Patient not taking: Reported on 05/17/2016 07/26/15   Karie Schwalbeichard I Letvak, MD      VITAL SIGNS:  Blood pressure 188/72, pulse 48, temperature 98.1 F (36.7 C), temperature source Oral, resp. rate 16, height 5\' 1"  (1.549 m), weight 49.896 kg (110 lb), SpO2 97 %.  PHYSICAL EXAMINATION:  GENERAL:  82 y.o.-year-old patient lying in the bed with no  acute distress.  EYES: Pupils equal, round, reactive to light and accommodation. No scleral icterus. Extraocular muscles intact.  HEENT: Head atraumatic, normocephalic. Oropharynx and nasopharynx clear.  NECK:  Supple, no jugular venous distention. No thyroid enlargement, no tenderness.  LUNGS: Normal breath sounds bilaterally, no wheezing, rales,rhonchi or crepitation. No use of accessory muscles of respiration.  CARDIOVASCULAR: S1, S2 normal. No murmurs, rubs, or gallops.  ABDOMEN: Soft, nontender, nondistended. Bowel sounds present. No organomegaly or mass.  EXTREMITIES:  right middle finger status post reduction and placed in a splint. Right hip area is externally rotated and abducted. Right elbow with hematoma No pedal edema, cyanosis, or clubbing.  NEUROLOGIC: Cranial nerves II through XII are intact. Muscle strength 5/5 in all extremities. Sensation intact. Gait not checked.  PSYCHIATRIC: The patient is alert and oriented x 3.  SKIN: No obvious rash or ulcer.   LABORATORY PANEL:   CBC  Recent Labs Lab 05/17/16 1132  WBC 10.0  HGB 12.3  HCT 36.0  PLT 173   ------------------------------------------------------------------------------------------------------------------  Chemistries   Recent Labs Lab 05/17/16 1132  NA 142  K 4.1  CL 107  CO2 28  GLUCOSE 103*  BUN 23*  CREATININE 0.80  CALCIUM 9.9   ------------------------------------------------------------------------------------------------------------------  Cardiac Enzymes No results for input(s): TROPONINI in the last 168 hours. ------------------------------------------------------------------------------------------------------------------  RADIOLOGY:  Dg Chest Portable 1 View  05/17/2016  CLINICAL DATA:  Pain following fall EXAM: PORTABLE CHEST 1 VIEW COMPARISON:  July 02, 2015 FINDINGS: There is no edema or consolidation. Heart size and pulmonary vascularity are normal. No adenopathy. No pneumothorax.  There is degenerative change in the thoracic spine. No fracture evident. IMPRESSION: No edema or consolidation.  No demonstrable pneumothorax. Electronically Signed   By: Bretta Bang III M.D.   On: 05/17/2016 11:17   Dg Hand Complete Right  05/17/2016  CLINICAL DATA:  Pain following fall EXAM: RIGHT HAND - COMPLETE 3+ VIEW COMPARISON:  None. FINDINGS: Frontal, oblique, and lateral views were obtained. There is no acute fracture. There is evidence of old trauma involving the distal radius. There is dislocation at the third PIP joint with the third middle and distal phalanges dislocated dorsally and slightly medial to the third proximal phalanx. No other dislocations. There is extensive osteoarthritic change at essentially all joints throughout the hand and wrist. No erosive change or bony destruction. IMPRESSION: Dislocation at the third PIP joint. No acute fracture. Fairly extensive osteoarthritic change at all joints. No erosive change. Evidence of old healed trauma involving the distal radius. Electronically Signed   By: Bretta Bang III M.D.   On: 05/17/2016 11:16   Dg Hip Unilat With Pelvis 2-3 Views Right  05/17/2016  CLINICAL DATA:  Fall today with right hip pain.  Initial encounter. EXAM: DG HIP (WITH OR WITHOUT PELVIS) 2-3V RIGHT COMPARISON:  Left hip series 16109 FINDINGS: There is an intertrochanteric right femur fracture with mild extension into the sub trochanteric region. A lesser trochanter fragment is mildly displaced medially. The femoral head remains approximated with the acetabulum. No focal osseous lesion is identified. Pelvic phleboliths and vascular calcifications are noted. IMPRESSION: Mildly displaced intertrochanteric right femur fracture. Electronically Signed   By: Sebastian Ache M.D.   On: 05/17/2016 11:15    EKG:   Orders placed or performed in visit on 05/01/16  . EKG 12-Lead    IMPRESSION AND PLAN:   Christina [name]  is a 82 y.o. female with a known history of  coronary artery disease, hypertension, osteoporosis and peripheral vascular disease and multiple other medical problems was brought into the ED after she sustained a fall this a.m.Patient was walking her dog on the drive way and tripped over and fell on her right side. Patient is brought into the ED x-ray has revealed right intertrochanteric fracture and right third finger PIP dislocation. Dr. Martha Clan is planning to do surgery in a.m.   #Right hip intertrochanteric fracture  Admit patient to MedSurg floor Consult to orthopedics Dr. Martha Clan  he is aware planning to do surgery in a.m. after cardiology clearance Cardiac consult is placed to Dr. Hilary Hertz in view of coronary artery disease and advanced age 37-years Pain management as needed. NPO after midnight   #History of coronary artery disease Continue Coreg and statin. Currently patient is asymptomatic. Cardiology clearance is requested by Dr. Kirke Corin Resume aspirin after surgery  #Essential hypertension Resume home medication lisinopril and titrate as needed  #Hyperlipidemia continue statin-Crestor  DVT prophylaxis with SCDs     All the records are reviewed and case discussed with ED provider. Management plans discussed with the patient, family and they are in agreement.  CODE STATUS: DO NOT RESUSCITATE, both son and daughter are the healthcare power of attorney  TOTAL TIME TAKING CARE OF THIS PATIENT: .    Ramonita Lab M.D on 05/17/2016 at 2:42 PM  Between 7am to 6pm - Pager - [phone]  After 6pm go to [email or web address] - password [email or web address] [facility]  [place] [facility]  Office  [phone]  CC: Primary care physician; Tillman Abide, MD

## 2016-05-17 NOTE — Progress Notes (Signed)
Patient ID: Christina RenshawRuth J Schaberg, female   DOB: 04-08-1920, 82 y.o.   MRN: [id number]  I reviewed Dr Jari SportsmanArida's recent note and discussed case with Dr Martha ClanKrasinski.  In the absence of any new cardiac symptoms and with mechanical fall and hip fracture, I think that the patient is of reasonable risk to undergo hip surgery (will be necessary for her to walk).  She will continue Coreg peri-operatively.   Marca AnconaDalton Halima Fogal 05/17/2016

## 2016-05-17 NOTE — Progress Notes (Signed)
Pt resting quietly in bed. Family at bedside. Foley cath placed, TEDs, foot pumps, ice pack,  5lb Buck's traction, MRSA swab completed. Dr. Martha ClanKrasinski rounded this evening, surgical procedure explained, pt ok for consent. Awaiting clearance from Cardiology and Medical for surgery, tentatively tomorrow AM.

## 2016-05-18 ENCOUNTER — Inpatient Hospital Stay: Payer: Medicare Other

## 2016-05-18 ENCOUNTER — Encounter
Admission: EM | Disposition: A | Discharge status: Skilled Nursing Facility | Payer: Self-pay | Source: Home / Self Care | Attending: Internal Medicine

## 2016-05-18 ENCOUNTER — Inpatient Hospital Stay: Payer: Medicare Other | Admitting: Anesthesiology

## 2016-05-18 ENCOUNTER — Encounter: Payer: Self-pay | Admitting: Orthopedic Surgery

## 2016-05-18 HISTORY — PX: INTRAMEDULLARY (IM) NAIL INTERTROCHANTERIC: SHX5875

## 2016-05-18 LAB — COMPREHENSIVE METABOLIC PANEL
ALK PHOS: 48 U/L (ref 38–126)
ALT: 14 U/L (ref 14–54)
AST: 19 U/L (ref 15–41)
Albumin: 3.8 g/dL (ref 3.5–5.0)
Anion gap: 6 (ref 5–15)
BUN: 20 mg/dL (ref 6–20)
CO2: 29 mmol/L (ref 22–32)
CREATININE: 0.93 mg/dL (ref 0.44–1.00)
Calcium: 9.3 mg/dL (ref 8.9–10.3)
Chloride: 105 mmol/L (ref 101–111)
GFR calc non Af Amer: 51 mL/min — ABNORMAL LOW (ref >60)
GFR, EST AFRICAN AMERICAN: 59 mL/min — AB (ref >60)
GLUCOSE: 99 mg/dL (ref 65–99)
Potassium: 3.7 mmol/L (ref 3.5–5.1)
SODIUM: 140 mmol/L (ref 135–145)
Total Bilirubin: 1.1 mg/dL (ref 0.3–1.2)
Total Protein: 6.8 g/dL (ref 6.5–8.1)

## 2016-05-18 LAB — CBC
HCT: 37.8 % (ref 35.0–47.0)
HEMOGLOBIN: 12.5 g/dL (ref 12.0–16.0)
MCH: 31.2 pg (ref 26.0–34.0)
MCHC: 33.1 g/dL (ref 32.0–36.0)
MCV: 94.4 fL (ref 80.0–100.0)
Platelets: 171 10*3/uL (ref 150–440)
RBC: 4.01 MIL/uL (ref 3.80–5.20)
RDW: 13.6 % (ref 11.5–14.5)
WBC: 13 10*3/uL — ABNORMAL HIGH (ref 3.6–11.0)

## 2016-05-18 LAB — URINE CULTURE: CULTURE: NO GROWTH

## 2016-05-18 SURGERY — FIXATION, FRACTURE, INTERTROCHANTERIC, WITH INTRAMEDULLARY ROD
Anesthesia: Spinal | Laterality: Right

## 2016-05-18 MED ORDER — FENTANYL CITRATE (PF) 100 MCG/2ML IJ SOLN: 25.0000 ug | INTRAMUSCULAR | Status: DC | PRN

## 2016-05-18 MED ORDER — ENOXAPARIN SODIUM 40 MG/0.4ML ~~LOC~~ SOLN: 40.0000 mg | SUBCUTANEOUS | Status: DC

## 2016-05-18 MED ORDER — NEOMYCIN-POLYMYXIN B GU 40-200000 IR SOLN
Status: DC | PRN
  Administered 2016-05-18: 4 mL

## 2016-05-18 MED ORDER — ENOXAPARIN SODIUM 30 MG/0.3ML ~~LOC~~ SOLN
30.0000 mg | SUBCUTANEOUS | Status: DC
  Administered 2016-05-19 – 2016-05-22 (×4): 30 mg via SUBCUTANEOUS
  Filled 2016-05-18 (×4): qty 0.3

## 2016-05-18 MED ORDER — BUPIVACAINE HCL (PF) 0.5 % IJ SOLN
INTRAMUSCULAR | Status: DC | PRN
  Administered 2016-05-18: 3 mL

## 2016-05-18 MED ORDER — LACTATED RINGERS IV SOLN
INTRAVENOUS | Status: DC | PRN
  Administered 2016-05-18: 08:00:00 via INTRAVENOUS

## 2016-05-18 MED ORDER — PROPOFOL 500 MG/50ML IV EMUL
INTRAVENOUS | Status: DC | PRN
  Administered 2016-05-18: 25 ug/kg/min via INTRAVENOUS

## 2016-05-18 MED ORDER — ONDANSETRON HCL 4 MG/2ML IJ SOLN
4.0000 mg | Freq: Once | INTRAMUSCULAR | Status: DC | PRN
Start: 2016-05-18 — End: 2016-05-18

## 2016-05-18 MED ORDER — CEFAZOLIN SODIUM-DEXTROSE 2-4 GM/100ML-% IV SOLN
2.0000 g | Freq: Four times a day (QID) | INTRAVENOUS | Status: AC
  Administered 2016-05-18 (×2): 2 g via INTRAVENOUS
  Filled 2016-05-18 (×3): qty 100

## 2016-05-18 MED ORDER — GLYCOPYRROLATE 0.2 MG/ML IJ SOLN
INTRAMUSCULAR | Status: DC | PRN
  Administered 2016-05-18: 0.2 mg via INTRAVENOUS

## 2016-05-18 MED ORDER — KETAMINE HCL 50 MG/ML IJ SOLN
INTRAMUSCULAR | Status: DC | PRN
  Administered 2016-05-18: 20 mg via INTRAMUSCULAR

## 2016-05-18 MED ORDER — MIDAZOLAM HCL 5 MG/5ML IJ SOLN
INTRAMUSCULAR | Status: DC | PRN
  Administered 2016-05-18: 0.5 mg via INTRAVENOUS

## 2016-05-18 MED ORDER — CEFAZOLIN SODIUM 1 G IJ SOLR
INTRAMUSCULAR | Status: DC | PRN
  Administered 2016-05-18: 2 g via INTRAMUSCULAR

## 2016-05-18 MED ORDER — PHENYLEPHRINE HCL 10 MG/ML IJ SOLN
INTRAMUSCULAR | Status: DC | PRN
  Administered 2016-05-18: 100 ug via INTRAVENOUS
  Administered 2016-05-18 (×3): 50 ug via INTRAVENOUS
  Administered 2016-05-18: 100 ug via INTRAVENOUS

## 2016-05-18 MED ORDER — BISACODYL 10 MG RE SUPP: 10.0000 mg | Freq: Every day | RECTAL | Status: DC | PRN

## 2016-05-18 MED ORDER — SENNA 8.6 MG PO TABS
1.0000 | ORAL_TABLET | Freq: Two times a day (BID) | ORAL | Status: DC
  Administered 2016-05-18 – 2016-05-22 (×8): 8.6 mg via ORAL
  Filled 2016-05-18 (×8): qty 1

## 2016-05-18 MED ORDER — POLYETHYLENE GLYCOL 3350 17 G PO PACK
17.0000 g | PACK | Freq: Every day | ORAL | Status: DC | PRN
  Administered 2016-05-20 – 2016-05-22 (×2): 17 g via ORAL
  Filled 2016-05-18 (×2): qty 1

## 2016-05-18 MED ORDER — DEXAMETHASONE SODIUM PHOSPHATE 10 MG/ML IJ SOLN
INTRAMUSCULAR | Status: DC | PRN
  Administered 2016-05-18: 5 mg via INTRAVENOUS

## 2016-05-18 MED ORDER — ONDANSETRON HCL 4 MG/2ML IJ SOLN
INTRAMUSCULAR | Status: DC | PRN
  Administered 2016-05-18: 4 mg via INTRAVENOUS

## 2016-05-18 MED ORDER — SODIUM CHLORIDE 0.9 % IV SOLN
75.0000 mL/h | INTRAVENOUS | Status: DC
  Administered 2016-05-18 – 2016-05-20 (×3): 75 mL/h via INTRAVENOUS

## 2016-05-18 MED ORDER — MENTHOL 3 MG MT LOZG
1.0000 | LOZENGE | OROMUCOSAL | Status: DC | PRN
  Filled 2016-05-18: qty 9

## 2016-05-18 SURGICAL SUPPLY — 36 items
BIT DRILL CANN LG 4.3MM (BIT) IMPLANT
BNDG COHESIVE 6X5 TAN STRL LF (GAUZE/BANDAGES/DRESSINGS) ×6 IMPLANT
CANISTER SUCT 1200ML W/VALVE (MISCELLANEOUS) ×3 IMPLANT
DRAPE SHEET LG 3/4 BI-LAMINATE (DRAPES) ×6 IMPLANT
DRAPE SURG 17X11 SM STRL (DRAPES) ×6 IMPLANT
DRAPE U-SHAPE 47X51 STRL (DRAPES) ×3 IMPLANT
DRILL BIT CANN LG 4.3MM (BIT) ×3
DRSG OPSITE POSTOP 3X4 (GAUZE/BANDAGES/DRESSINGS) ×4 IMPLANT
DRSG OPSITE POSTOP 4X14 (GAUZE/BANDAGES/DRESSINGS) ×3 IMPLANT
DURAPREP 26ML APPLICATOR (WOUND CARE) ×6 IMPLANT
ELECT REM PT RETURN 9FT ADLT (ELECTROSURGICAL) ×3
ELECTRODE REM PT RTRN 9FT ADLT (ELECTROSURGICAL) ×1 IMPLANT
GLOVE BIOGEL PI IND STRL 9 (GLOVE) ×1 IMPLANT
GLOVE BIOGEL PI INDICATOR 9 (GLOVE) ×2
GLOVE SURG 9.0 ORTHO LTXF (GLOVE) ×6 IMPLANT
GOWN STRL REUS TWL 2XL XL LVL4 (GOWN DISPOSABLE) ×3 IMPLANT
GOWN STRL REUS W/ TWL LRG LVL3 (GOWN DISPOSABLE) ×1 IMPLANT
GOWN STRL REUS W/TWL LRG LVL3 (GOWN DISPOSABLE) ×3
GUIDEPIN VERSANAIL DSP 3.2X444 ×2 IMPLANT
HEMOVAC 400CC 10FR (MISCELLANEOUS) ×3 IMPLANT
KIT RM TURNOVER CYSTO AR (KITS) ×3 IMPLANT
MAT BLUE FLOOR 46X72 FLO (MISCELLANEOUS) ×3 IMPLANT
NAIL HIP FRACT 130D 9X180 (Orthopedic Implant) ×2 IMPLANT
NS IRRIG 1000ML POUR BTL (IV SOLUTION) ×3 IMPLANT
PACK HIP COMPR (MISCELLANEOUS) ×3 IMPLANT
SCREW BONE CORTICAL 5.0X3 (Screw) ×2 IMPLANT
SCREW LAG HIP NAIL 10.5X95 (Screw) ×2 IMPLANT
STAPLER SKIN PROX 35W (STAPLE) ×3 IMPLANT
SUCTION FRAZIER HANDLE 10FR (MISCELLANEOUS) ×2
SUCTION TUBE FRAZIER 10FR DISP (MISCELLANEOUS) ×1 IMPLANT
SUT VIC AB 0 CT1 36 (SUTURE) ×6 IMPLANT
SUT VIC AB 2-0 CT1 27 (SUTURE) ×3
SUT VIC AB 2-0 CT1 TAPERPNT 27 (SUTURE) ×1 IMPLANT
SUT VICRYL 0 AB UR-6 (SUTURE) ×3 IMPLANT
SYR 30ML LL (SYRINGE) ×3 IMPLANT
VERSANAIL THREADED GUIDE PIN IMPLANT

## 2016-05-18 NOTE — Anesthesia Procedure Notes (Signed)
Spinal Patient location during procedure: OR Start time: 05/18/2016 7:55 AM End time: 05/18/2016 8:04 AM Staffing Performed by: anesthesiologist  Preanesthetic Checklist Completed: patient identified, site marked, surgical consent, pre-op evaluation, timeout performed, IV checked, risks and benefits discussed and monitors and equipment checked Spinal Block Patient position: sitting Prep: Betadine Patient monitoring: heart rate, continuous pulse ox, blood pressure and cardiac monitor Approach: midline Location: L4-5 Injection technique: single-shot Needle Needle type: Whitacre and Introducer  Needle gauge: 25 G Needle length: 9 cm Needle insertion depth: 8 cm Additional Notes Negative paresthesia. Negative blood return. Positive free-flowing CSF. Expiration date of kit checked and confirmed. Patient tolerated procedure well, without complications.

## 2016-05-18 NOTE — Progress Notes (Signed)
Patient ID: Christina RenshawRuth J [name], female   DOB: 1919-12-25, 82 y.o.   MRN: [id number] [facility] [place] [facility]   PATIENT NAME: Christina BolsterRuth [name]    MR#:  [id number]  DATE OF BIRTH:  1919-12-25  SUBJECTIVE:   Patient just got back from her hip surgery. Denies any complaints. Family in the room. Patient came in after she had a mechanical fall at home while trying to take care of the dog slipped and fell on the driveway REVIEW OF SYSTEMS:   Review of Systems  Constitutional: Negative for fever, chills and weight loss.  HENT: Negative for ear discharge, ear pain and nosebleeds.   Eyes: Negative for blurred vision, pain and discharge.  Respiratory: Negative for sputum production, shortness of breath, wheezing and stridor.   Cardiovascular: Negative for chest pain, palpitations, orthopnea and PND.  Gastrointestinal: Negative for nausea, vomiting, abdominal pain and diarrhea.  Genitourinary: Negative for urgency and frequency.  Musculoskeletal: Positive for joint pain. Negative for back pain.  Neurological: Positive for weakness. Negative for sensory change, speech change and focal weakness.  Psychiatric/Behavioral: Negative for depression and hallucinations. The patient is not nervous/anxious.   All other systems reviewed and are negative.  Tolerating Diet:yes Tolerating PT: pending  DRUG ALLERGIES:   Allergies  Allergen Reactions  . Atorvastatin Other (See Comments)    Reaction:  Blisters on feet   . Diltiazem Hcl Other (See Comments)    Reaction:  Unknown   . Dye Fdc Red [Red Dye] Other (See Comments)    Reaction:  Unknown   . Gemfibrozil Other (See Comments)    Reaction:  Unknown   . Niacin Other (See Comments)    Reaction:  Unknown   . Sulfasalazine Rash    VITALS:  Blood pressure 144/60, pulse 64, temperature 97.5 F (36.4 C), temperature source Oral, resp. rate 16, height 5\' 1"  (1.549 m), weight 49.896 kg (110 lb), SpO2 97 %.  PHYSICAL EXAMINATION:    Physical Exam  GENERAL:  82 y.o.-year-old patient lying in the bed with no acute distress. thin EYES: Pupils equal, round, reactive to light and accommodation. No scleral icterus. Extraocular muscles intact.  HEENT: Head atraumatic, normocephalic. Oropharynx and nasopharynx clear.  NECK:  Supple, no jugular venous distention. No thyroid enlargement, no tenderness.  LUNGS: Normal breath sounds bilaterally, no wheezing, rales, rhonchi. No use of accessory muscles of respiration.  CARDIOVASCULAR: S1, S2 normal. No murmurs, rubs, or gallops.  ABDOMEN: Soft, nontender, nondistended. Bowel sounds present. No organomegaly or mass.  EXTREMITIES: No cyanosis, clubbing or edema b/l.    NEUROLOGIC: Cranial nerves II through XII are intact. No focal Motor or sensory deficits b/l.   PSYCHIATRIC:  patient is alert and oriented x 3.  SKIN: No obvious rash, lesion, or ulcer.   LABORATORY PANEL:  CBC  Recent Labs Lab 05/18/16 0654  WBC 13.0*  HGB 12.5  HCT 37.8  PLT 171    Chemistries   Recent Labs Lab 05/18/16 0654  NA 140  K 3.7  CL 105  CO2 29  GLUCOSE 99  BUN 20  CREATININE 0.93  CALCIUM 9.3  AST 19  ALT 14  ALKPHOS 48  BILITOT 1.1   Cardiac Enzymes No results for input(s): TROPONINI in the last 168 hours. RADIOLOGY:  Dg Chest Portable 1 View  05/17/2016  CLINICAL DATA:  Pain following fall EXAM: PORTABLE CHEST 1 VIEW COMPARISON:  July 02, 2015 FINDINGS: There is no edema or consolidation. Heart size and pulmonary  vascularity are normal. No adenopathy. No pneumothorax. There is degenerative change in the thoracic spine. No fracture evident. IMPRESSION: No edema or consolidation.  No demonstrable pneumothorax. Electronically Signed   By: Bretta Bang III M.D.   On: 05/17/2016 11:17   Dg Hand Complete Right  05/17/2016  CLINICAL DATA:  Pain following fall EXAM: RIGHT HAND - COMPLETE 3+ VIEW COMPARISON:  None. FINDINGS: Frontal, oblique, and lateral views were obtained.  There is no acute fracture. There is evidence of old trauma involving the distal radius. There is dislocation at the third PIP joint with the third middle and distal phalanges dislocated dorsally and slightly medial to the third proximal phalanx. No other dislocations. There is extensive osteoarthritic change at essentially all joints throughout the hand and wrist. No erosive change or bony destruction. IMPRESSION: Dislocation at the third PIP joint. No acute fracture. Fairly extensive osteoarthritic change at all joints. No erosive change. Evidence of old healed trauma involving the distal radius. Electronically Signed   By: Bretta Bang III M.D.   On: 05/17/2016 11:16   Dg Hip Port Unilat With Pelvis 1v Right  05/18/2016  CLINICAL DATA:  Status post surgical fixation for proximal femur fracture EXAM: DG HIP   2V PORT RIGHT COMPARISON:  May 17, 2016 FINDINGS: Frontal and lateral views were obtained it. There is screw and rod fixation through a comminuted intertrochanteric femur fracture on the right. Alignment of the major fracture fragments is near anatomic. There is avulsion of the lesser trochanter on the right. Tip of the screw is in the proximal femoral head. No new fracture. No dislocation. There is mild narrowing of the right hip joint. Foci of soft tissue air are an expected postoperative finding. IMPRESSION: Postoperative fixation for comminuted proximal femur fracture with major fracture fragments in near anatomic alignment. No dislocation. Narrowing right hip joint. Electronically Signed   By: Bretta Bang III M.D.   On: 05/18/2016 10:34   Dg Hip Operative Unilat W Or W/o Pelvis Right  05/18/2016  CLINICAL DATA:  ORIF right femur EXAM: OPERATIVE RIGHT HIP (WITH PELVIS IF PERFORMED) 2 VIEWS TECHNIQUE: Fluoroscopic spot image(s) were submitted for interpretation post-operatively. COMPARISON:  05/17/2016 FINDINGS: Fluoroscopy Time:  1 minutes 12 seconds Number of Acquired Images:  6  Intertrochanteric right femur fracture status post intra medullary nail placement. Near anatomic position and alignment at the fracture site. IMPRESSION: ORIF right femur fracture Electronically Signed   By: Esperanza Heir M.D.   On: 05/18/2016 09:28   Dg Hip Unilat With Pelvis 2-3 Views Right  05/17/2016  CLINICAL DATA:  Fall today with right hip pain.  Initial encounter. EXAM: DG HIP (WITH OR WITHOUT PELVIS) 2-3V RIGHT COMPARISON:  Left hip series [id number] FINDINGS: There is an intertrochanteric right femur fracture with mild extension into the sub trochanteric region. A lesser trochanter fragment is mildly displaced medially. The femoral head remains approximated with the acetabulum. No focal osseous lesion is identified. Pelvic phleboliths and vascular calcifications are noted. IMPRESSION: Mildly displaced intertrochanteric right femur fracture. Electronically Signed   By: Sebastian Ache M.D.   On: 05/17/2016 11:15   ASSESSMENT AND PLAN:  Glorya Bartley is a 82 y.o. female with a known history of coronary artery disease, hypertension, osteoporosis and peripheral vascular disease and multiple other medical problems was brought into the ED after she sustained a fall this a.m.Patient was walking her dog on the drive way and tripped over and fell on her right side. Patient is brought into the  ED x-ray has revealed right intertrochanteric fracture and right third finger PIP dislocation. Dr. Martha Clan is planning to do surgery in a.m.  #Right hip intertrochanteric fracture s/p surgery Appreciate Dr. Samuel Germany input Pt was seen by cardiology and cleared last pm for surgery. Pt tolerated it well Pain management as needed. NPO after midnight  #History of coronary artery disease Continue Coreg and statin. Currently patient is asymptomatic. Resume aspirin after surgery  #Essential hypertension Resume home medication lisinopril and titrate as needed  #Hyperlipidemia continue statin-Crestor  #DVT  prophylaxis with SCDs  Case discussed with Care Management/Social Worker. Management plans discussed with the patient, family and they are in agreement.  CODE STATUS: fDNR  DVT Prophylaxis: lovenox TOTAL TIME TAKING CARE OF THIS PATIENT: 30 minutes.  >50% time spent on counselling and coordination of care  POSSIBLE D/C IN 1-2 DAYS, DEPENDING ON CLINICAL CONDITION.  Note: This dictation was prepared with Dragon dictation along with smaller phrase technology. Any transcriptional errors that result from this process are unintentional.  Kaitlyn Skowron M.D on 05/18/2016 at 12:59 PM  Between 7am to 6pm - Pager - [phone]  After 6pm go to [email or web address] - password [email or web address] [facility]  [facility]  Office  [phone]  CC: Primary care physician; Tillman Abide, MD

## 2016-05-18 NOTE — Op Note (Signed)
DATE OF SURGERY:  05/18/2016  TIME: 9:59 AM  PATIENT NAME:  Christina [name]  AGE: 82 y.o.  PRE-OPERATIVE DIAGNOSIS:  fractured right hip  POST-OPERATIVE DIAGNOSIS:  SAME  PROCEDURE:  INTRAMEDULLARY (IM) NAIL INTERTROCHANTRIC  SURGEON:  Kaci Freel  OPERATIVE IMPLANTS: Biomet short Affixus nail 130 degree 9 x 180, 95 mm lag screw with a 34 mm distal interlocking screw  PREOPERATIVE INDICATIONS:  Christina [name] is a 82 y.o. year old who fell and suffered a hip fracture. She was brought into the ER and then admitted and medically cleared for surgical intervention.    The risks, benefits and alternatives were discussed with the patient and their family.  The risks include but are not limited to infection, bleeding, nerve or blood vessel injury, malunion, nonunion, hardware prominence, hardware failure, change in leg lengths or lower extremity rotation need for further surgery including hardware removal with conversion to a total hip arthroplasty. Medical risks include but are not limited to DVT and pulmonary embolism, myocardial infarction, stroke, pneumonia, respiratory failure and death. The patient and their family understood these risks and wished to proceed with surgery.  OPERATIVE PROCEDURE:  The patient was brought to the operating room and placed in the supine position on the fracture table. Spinal anesthesia was administered.  A closed reduction was performed under C-arm guidance.  The fracture reduction was confirmed on both AP and lateral views. A time out was performed to verify the patient's name, date of birth, medical record number, correct site of surgery correct procedure to be performed. The timeout was also used to verify the patient received antibiotics and all appropriate instruments, implants and radiographic studies were available in the room. Once all in attendance were in agreement, the case began. The patient was prepped and draped in a sterile fashion. She received  preoperative ancef.  An incision was made proximal to the greater trochanter in line with the femur. A guidewire was placed over the tip of the greater trochanter and advanced into the proximal femur to the level of the lesser trochanter.  Confirmation of the drill pin position was made on AP and lateral C-arm images.  The threaded guidepin was then overdrilled with the proximal femoral drill.  The nail was then inserted into the proximal femur, across the fracture site and into the femoral shaft. Its position was confirmed on AP and lateral C-arm images.   Once the nail was completely seated, the drill guide for the lag screw was placed through the guide arm for the Affixus nail. A guidepin was then placed through this drill guide and advanced through the lateral cortex of the femur, across the fracture site and into the femoral head achieving a tip apex distance of less than 25 mm. The length of the drill pin was measured, and then the drill for the lag screw was advanced through the lateral cortex, across the fracture site and up into the femoral head to the depth of the lag screw..  The lag screw was then advanced by hand into position across the fracture site into the femoral head. Its final position was confirmed on AP and lateral C-arm images. Compression was applied as traction was carefully released. The set screw in the top of the intramedullary rod was tightened by hand using a screwdriver. It was backed off a quarter turn to allow for compression at the fracture site.  The drill sleeve for the distal interlocking screw was then placed through the Affixus guide arm.  A small stab incision was made to allow the drill guide to approximate the lateral cortex of the femur. The drill for the distal interlocking screw was then advanced bicortically. The depth of this drill was measured to 34mm. A 34mm distal interlocking screw was then inserted by hand through the guide arm. Final C-arm images of the  entire intramedullary construct were taken in both the AP and lateral planes.   The wounds were irrigated copiously and closed with 0 Vicryl for closure of the deep fascia and 2-0 Vicryl for subcutaneous closure. The skin was approximated with staples. A dry sterile dressing was applied. I was scrubbed and present the entire case and all sharp and instrument counts were correct at the conclusion of the case. Patient was transferred to hospital bed and brought to PACU in stable condition. I spoke with the patient's family in the postop consultation room to let them know the case had been performed without complication and the patient was stable in the recovery room.    Kathreen Devoid, MD

## 2016-05-18 NOTE — Progress Notes (Signed)
Subjective:  POST-OP CHECK:  Patient reports pain as mild.  Her spinal is beginning to wear off.  She complains of a sore throat.  Objective:   VITALS:   Filed Vitals:   05/18/16 0955 05/18/16 1010 05/18/16 1025 05/18/16 1107  BP: 130/60 131/56 146/54 149/56  Pulse: 70 69 70 68  Temp:  97.6 F (36.4 C)  97.8 F (36.6 C)  TempSrc:    Axillary  Resp: 7 10 10 16   Height:      Weight:      SpO2: 100% 100% 100% 90%    PHYSICAL EXAM:  Right lower extremity:  Patient is beginning to regain motion of her toes and has intact sensation to light touch.  Pedal pulses are palpable.  LABS  Results for orders placed or performed during the hospital encounter of 05/17/16 (from the past 24 hour(s))  Surgical PCR screen     Status: None   Collection Time: 05/17/16  6:40 PM  Result Value Ref Range   MRSA, PCR NEGATIVE NEGATIVE   Staphylococcus aureus NEGATIVE NEGATIVE  CBC     Status: Abnormal   Collection Time: 05/18/16  6:54 AM  Result Value Ref Range   WBC 13.0 (H) 3.6 - 11.0 K/uL   RBC 4.01 3.80 - 5.20 MIL/uL   Hemoglobin 12.5 12.0 - 16.0 g/dL   HCT 14.737.8 82.935.0 - 56.247.0 %   MCV 94.4 80.0 - 100.0 fL   MCH 31.2 26.0 - 34.0 pg   MCHC 33.1 32.0 - 36.0 g/dL   RDW 13.013.6 86.511.5 - 78.414.5 %   Platelets 171 150 - 440 K/uL  Comprehensive metabolic panel     Status: Abnormal   Collection Time: 05/18/16  6:54 AM  Result Value Ref Range   Sodium 140 135 - 145 mmol/L   Potassium 3.7 3.5 - 5.1 mmol/L   Chloride 105 101 - 111 mmol/L   CO2 29 22 - 32 mmol/L   Glucose, Bld 99 65 - 99 mg/dL   BUN 20 6 - 20 mg/dL   Creatinine, Ser 6.960.93 0.44 - 1.00 mg/dL   Calcium 9.3 8.9 - 29.510.3 mg/dL   Total Protein 6.8 6.5 - 8.1 g/dL   Albumin 3.8 3.5 - 5.0 g/dL   AST 19 15 - 41 U/L   ALT 14 14 - 54 U/L   Alkaline Phosphatase 48 38 - 126 U/L   Total Bilirubin 1.1 0.3 - 1.2 mg/dL   GFR calc non Af Amer 51 (L) >60 mL/min   GFR calc Af Amer 59 (L) >60 mL/min   Anion gap 6 5 - 15    Dg Chest Portable 1  View  05/17/2016  CLINICAL DATA:  Pain following fall EXAM: PORTABLE CHEST 1 VIEW COMPARISON:  July 02, 2015 FINDINGS: There is no edema or consolidation. Heart size and pulmonary vascularity are normal. No adenopathy. No pneumothorax. There is degenerative change in the thoracic spine. No fracture evident. IMPRESSION: No edema or consolidation.  No demonstrable pneumothorax. Electronically Signed   By: Bretta BangWilliam  Woodruff III M.D.   On: 05/17/2016 11:17   Dg Hand Complete Right  05/17/2016  CLINICAL DATA:  Pain following fall EXAM: RIGHT HAND - COMPLETE 3+ VIEW COMPARISON:  None. FINDINGS: Frontal, oblique, and lateral views were obtained. There is no acute fracture. There is evidence of old trauma involving the distal radius. There is dislocation at the third PIP joint with the third middle and distal phalanges dislocated dorsally and slightly medial to the third proximal  phalanx. No other dislocations. There is extensive osteoarthritic change at essentially all joints throughout the hand and wrist. No erosive change or bony destruction. IMPRESSION: Dislocation at the third PIP joint. No acute fracture. Fairly extensive osteoarthritic change at all joints. No erosive change. Evidence of old healed trauma involving the distal radius. Electronically Signed   By: Bretta Bang III M.D.   On: 05/17/2016 11:16   Dg Hip Port Unilat With Pelvis 1v Right  05/18/2016  CLINICAL DATA:  Status post surgical fixation for proximal femur fracture EXAM: DG HIP   2V PORT RIGHT COMPARISON:  May 17, 2016 FINDINGS: Frontal and lateral views were obtained it. There is screw and rod fixation through a comminuted intertrochanteric femur fracture on the right. Alignment of the major fracture fragments is near anatomic. There is avulsion of the lesser trochanter on the right. Tip of the screw is in the proximal femoral head. No new fracture. No dislocation. There is mild narrowing of the right hip joint. Foci of soft tissue air  are an expected postoperative finding. IMPRESSION: Postoperative fixation for comminuted proximal femur fracture with major fracture fragments in near anatomic alignment. No dislocation. Narrowing right hip joint. Electronically Signed   By: Bretta Bang III M.D.   On: 05/18/2016 10:34   Dg Hip Operative Unilat W Or W/o Pelvis Right  05/18/2016  CLINICAL DATA:  ORIF right femur EXAM: OPERATIVE RIGHT HIP (WITH PELVIS IF PERFORMED) 2 VIEWS TECHNIQUE: Fluoroscopic spot image(s) were submitted for interpretation post-operatively. COMPARISON:  05/17/2016 FINDINGS: Fluoroscopy Time:  1 minutes 12 seconds Number of Acquired Images:  6 Intertrochanteric right femur fracture status post intra medullary nail placement. Near anatomic position and alignment at the fracture site. IMPRESSION: ORIF right femur fracture Electronically Signed   By: Esperanza Heir M.D.   On: 05/18/2016 09:28   Dg Hip Unilat With Pelvis 2-3 Views Right  05/17/2016  CLINICAL DATA:  Fall today with right hip pain.  Initial encounter. EXAM: DG HIP (WITH OR WITHOUT PELVIS) 2-3V RIGHT COMPARISON:  Left hip series [id number] FINDINGS: There is an intertrochanteric right femur fracture with mild extension into the sub trochanteric region. A lesser trochanter fragment is mildly displaced medially. The femoral head remains approximated with the acetabulum. No focal osseous lesion is identified. Pelvic phleboliths and vascular calcifications are noted. IMPRESSION: Mildly displaced intertrochanteric right femur fracture. Electronically Signed   By: Sebastian Ache M.D.   On: 05/17/2016 11:15    Assessment/Plan: Day of Surgery   Active Problems:   Hip fracture, right [facility])  Patient is stable post-op.  Continue antibiotics for 24 hours post-op.  Begin PT/OT tomorrow.  TEDs and foot pumps in place.  Lovenox to start tomorrow.  I have reviewed post-op xray which shows fracture is well aligned.      [name] , MD 05/18/2016, 11:56  AM

## 2016-05-18 NOTE — Progress Notes (Signed)
Anticoagulation monitoring(Lovenox):  82yo  ordered Lovenox 40 mg Q24h  Filed Weights   05/17/16 1030  Weight: 110 lb (49.896 kg)   Body mass index is 20.8 kg/(m^2).  Lab Results  Component Value Date   CREATININE 0.93 05/18/2016   CREATININE 0.80 05/17/2016   CREATININE 0.75 07/03/2015   Estimated Creatinine Clearance: 27.9 mL/min (by C-G formula based on Cr of 0.93). Hemoglobin & Hematocrit     Component Value Date/Time   HGB 12.5 05/18/2016 0654   HGB 12.7 05/23/2014 1603   HCT 37.8 05/18/2016 0654   HCT 39.0 05/23/2014 1603     Per Protocol for Patient with estCrcl< 30 ml/min and BMI < 40, will transition to Lovenox 30 mg Q24h.

## 2016-05-18 NOTE — Progress Notes (Signed)
Subjective:  Patient seen in pre-op area with her family. Patient reports pain as mild.  Nausea has resolved.  Objective:   VITALS:   Filed Vitals:   05/17/16 1400 05/17/16 1514 05/17/16 1941 05/18/16 0419  BP: 167/74 173/52 151/62 177/58  Pulse: 64 54 71 69  Temp:  97.6 F (36.4 C) 97.8 F (36.6 C) 98.3 F (36.8 C)  TempSrc:  Oral Oral Oral  Resp:  18 18 19   Height:      Weight:      SpO2:  97% 95% 95%    PHYSICAL EXAM:  Right lower extremity: Skin intact, no significant swelling, NVI.  Intact motor function.   LABS  Results for orders placed or performed during the hospital encounter of 05/17/16 (from the past 24 hour(s))  CBC     Status: None   Collection Time: 05/17/16 11:32 AM  Result Value Ref Range   WBC 10.0 3.6 - 11.0 K/uL   RBC 3.83 3.80 - 5.20 MIL/uL   Hemoglobin 12.3 12.0 - 16.0 g/dL   HCT 16.136.0 09.635.0 - 04.547.0 %   MCV 94.2 80.0 - 100.0 fL   MCH 32.2 26.0 - 34.0 pg   MCHC 34.1 32.0 - 36.0 g/dL   RDW 40.913.7 81.111.5 - 91.414.5 %   Platelets 173 150 - 440 K/uL  Basic metabolic panel     Status: Abnormal   Collection Time: 05/17/16 11:32 AM  Result Value Ref Range   Sodium 142 135 - 145 mmol/L   Potassium 4.1 3.5 - 5.1 mmol/L   Chloride 107 101 - 111 mmol/L   CO2 28 22 - 32 mmol/L   Glucose, Bld 103 (H) 65 - 99 mg/dL   BUN 23 (H) 6 - 20 mg/dL   Creatinine, Ser 7.820.80 0.44 - 1.00 mg/dL   Calcium 9.9 8.9 - 95.610.3 mg/dL   GFR calc non Af Amer >60 >60 mL/min   GFR calc Af Amer >60 >60 mL/min   Anion gap 7 5 - 15  Protime-INR     Status: None   Collection Time: 05/17/16 11:32 AM  Result Value Ref Range   Prothrombin Time 14.3 11.4 - 15.0 seconds   INR 1.09   APTT     Status: Abnormal   Collection Time: 05/17/16 11:32 AM  Result Value Ref Range   aPTT 50 (H) 24 - 36 seconds  Type and screen [facility]     Status: None   Collection Time: 05/17/16 11:32 AM  Result Value Ref Range   ABO/RH(D) O POS    Antibody Screen POS    Sample Expiration  05/20/2016    Antibody Identification ANTIBODY NOT DETECTED   Albumin     Status: None   Collection Time: 05/17/16 11:32 AM  Result Value Ref Range   Albumin 3.7 3.5 - 5.0 g/dL  ABO/Rh     Status: None   Collection Time: 05/17/16 11:33 AM  Result Value Ref Range   ABO/RH(D) O POS   Surgical PCR screen     Status: None   Collection Time: 05/17/16  6:40 PM  Result Value Ref Range   MRSA, PCR NEGATIVE NEGATIVE   Staphylococcus aureus NEGATIVE NEGATIVE    Dg Chest Portable 1 View  05/17/2016  CLINICAL DATA:  Pain following fall EXAM: PORTABLE CHEST 1 VIEW COMPARISON:  July 02, 2015 FINDINGS: There is no edema or consolidation. Heart size and pulmonary vascularity are normal. No adenopathy. No pneumothorax. There is degenerative change in the  thoracic spine. No fracture evident. IMPRESSION: No edema or consolidation.  No demonstrable pneumothorax. Electronically Signed   By: Bretta Bang III M.D.   On: 05/17/2016 11:17   Dg Hand Complete Right  05/17/2016  CLINICAL DATA:  Pain following fall EXAM: RIGHT HAND - COMPLETE 3+ VIEW COMPARISON:  None. FINDINGS: Frontal, oblique, and lateral views were obtained. There is no acute fracture. There is evidence of old trauma involving the distal radius. There is dislocation at the third PIP joint with the third middle and distal phalanges dislocated dorsally and slightly medial to the third proximal phalanx. No other dislocations. There is extensive osteoarthritic change at essentially all joints throughout the hand and wrist. No erosive change or bony destruction. IMPRESSION: Dislocation at the third PIP joint. No acute fracture. [name] extensive osteoarthritic change at all joints. No erosive change. Evidence of old healed trauma involving the distal radius. Electronically Signed   By: Bretta Bang III M.D.   On: 05/17/2016 11:16   Dg Hip Unilat With Pelvis 2-3 Views Right  05/17/2016  CLINICAL DATA:  Fall today with right hip pain.  Initial  encounter. EXAM: DG HIP (WITH OR WITHOUT PELVIS) 2-3V RIGHT COMPARISON:  Left hip series 16109 FINDINGS: There is an intertrochanteric right femur fracture with mild extension into the sub trochanteric region. A lesser trochanter fragment is mildly displaced medially. The femoral head remains approximated with the acetabulum. No focal osseous lesion is identified. Pelvic phleboliths and vascular calcifications are noted. IMPRESSION: Mildly displaced intertrochanteric right femur fracture. Electronically Signed   By: Sebastian Ache M.D.   On: 05/17/2016 11:15    Assessment/Plan: Day of Surgery   Active Problems:   Hip fracture, right [facility])  Patient is having surgery this AM.  Cardiology has said to proceed with surgery.  I have answered all questions by the patient and her family.    Christina [name] , MD 05/18/2016, 7:30 AM

## 2016-05-18 NOTE — Care Management (Signed)
Met briefly with patient after she was received from PACU. She is from home with her husband. They were independent with mobility. She fell and husband also fell hitting his head while patient was at hospital. I understand per RN that he is currently at [facility]. Patient said daughter is handling all her needs. Patient is not aware of her husband's fall/situation per RN. Patient's PCP has contacted [facility] for SNF for this patient. RNCM will continue to follow along with CSW.

## 2016-05-18 NOTE — Anesthesia Preprocedure Evaluation (Addendum)
Anesthesia Evaluation  Patient identified by MRN, date of birth, ID band Patient awake and Patient confused    Reviewed: Allergy & Precautions, NPO status , Patient's Chart, lab work & pertinent test results  History of Anesthesia Complications (+) PONV  Airway Mallampati: II       Dental   Pulmonary neg pulmonary ROS,           Cardiovascular hypertension, + angina + CAD and + Peripheral Vascular Disease       Neuro/Psych negative neurological ROS     GI/Hepatic negative GI ROS, Neg liver ROS,   Endo/Other  negative endocrine ROS  Renal/GU negative Renal ROS     Musculoskeletal  (+) Arthritis , Osteoarthritis,    Abdominal   Peds  Hematology negative hematology ROS (+)   Anesthesia Other Findings   Reproductive/Obstetrics                            Anesthesia Physical Anesthesia Plan  ASA: III and emergent  Anesthesia Plan: Spinal   Post-op Pain Management:    Induction:   Airway Management Planned: Nasal Cannula  Additional Equipment:   Intra-op Plan:   Post-operative Plan:   Informed Consent: I have reviewed the patients History and Physical, chart, labs and discussed the procedure including the risks, benefits and alternatives for the proposed anesthesia with the patient or authorized representative who has indicated his/her understanding and acceptance.     Plan Discussed with:   Anesthesia Plan Comments:         Anesthesia Quick Evaluation

## 2016-05-18 NOTE — Transfer of Care (Signed)
Immediate Anesthesia Transfer of Care Note  Patient: Christina RenshawRuth J [name]  Procedure(s) Performed: Procedure(s): INTRAMEDULLARY (IM) NAIL INTERTROCHANTRIC (Right)  Patient Location: PACU  Anesthesia Type:Spinal  Level of Consciousness: awake, alert , oriented and patient cooperative  Airway & Oxygen Therapy: Patient Spontanous Breathing and Patient connected to face mask oxygen  Post-op Assessment: Report given to RN, Post -op Vital signs reviewed and stable and Patient moving all extremities X 4  Post vital signs: Reviewed and stable  Last Vitals:  Filed Vitals:   05/17/16 1941 05/18/16 0419  BP: 151/62 177/58  Pulse: 71 69  Temp: 36.6 C 36.8 C  Resp: 18 19    Last Pain:  Filed Vitals:   05/18/16 0605  PainSc: 9       Patients Stated Pain Goal: 4 (05/18/16 0600)  Complications: No apparent anesthesia complications

## 2016-05-19 LAB — CBC
HCT: 33 % — ABNORMAL LOW (ref 35.0–47.0)
Hemoglobin: 11.1 g/dL — ABNORMAL LOW (ref 12.0–16.0)
MCH: 32.4 pg (ref 26.0–34.0)
MCHC: 33.7 g/dL (ref 32.0–36.0)
MCV: 96.1 fL (ref 80.0–100.0)
PLATELETS: 139 10*3/uL — AB (ref 150–440)
RBC: 3.43 MIL/uL — AB (ref 3.80–5.20)
RDW: 13.6 % (ref 11.5–14.5)
WBC: 14.6 10*3/uL — ABNORMAL HIGH (ref 3.6–11.0)

## 2016-05-19 NOTE — Anesthesia Postprocedure Evaluation (Signed)
Anesthesia Post Note  Patient: Christina RenshawRuth J [name]  Procedure(s) Performed: Procedure(s) (LRB): INTRAMEDULLARY (IM) NAIL INTERTROCHANTRIC (Right)  Patient location during evaluation: Nursing Unit Anesthesia Type: Spinal Level of consciousness: awake and alert Pain management: satisfactory to patient Vital Signs Assessment: post-procedure vital signs reviewed and stable Respiratory status: respiratory function stable Cardiovascular status: stable Postop Assessment: no headache, no backache, spinal receding, patient able to bend at knees, no signs of nausea or vomiting and adequate PO intake Anesthetic complications: no    Last Vitals:  Filed Vitals:   05/19/16 0737 05/19/16 0739  BP: 117/39 111/39  Pulse: 58 59  Temp: 36.7 C   Resp: 17     Last Pain:  Filed Vitals:   05/19/16 0739  PainSc: 0-No pain                 Clydene PughBeane, Averyanna Sax D

## 2016-05-19 NOTE — Progress Notes (Signed)
Patient oriented and cooperative throughout the shift.  During first physical therapy session unable to participate due to unbearable pain.  Gave po Norco prior to PT (first session).  Patient rested for lunch inbetween therapy.  With po Oxy on board for physical therapy patient very lethargic during therapy and was unable to participate.  Plan for pain management tomorrow should include 5 mg of Oxycodone instead of 10 to see if patient can participate in therapy.   Patient did not void at 8 hour mark following foley removal.  Bladder scan revealed small amount, fluids continue to run, will recheck prior to shift change to address any retention if still unable to void.

## 2016-05-19 NOTE — Clinical Social Work Note (Signed)
Clinical Social Work Assessment  Patient Details  Name: Christina [name] MRN: [id number] Date of Birth: 1920/01/11  Date of referral:  05/19/16               Reason for consult:  Discharge Planning                Permission sought to share information with:  Family Supports Permission granted to share information::  Yes, Verbal Permission Granted  Name::        Agency::     Relationship::   (Daughter-Jane)  Contact Information:     Housing/Transportation Living arrangements for the past 2 months:  [place] of Information:  Patient, Adult Children Patient Interpreter Needed:  None Criminal Activity/Legal Involvement Pertinent to Current Situation/Hospitalization:  No - Comment as needed Significant Relationships:  Adult Children, Other Family Members Lives with:  Self Do you feel safe going back to the place where you live?  Yes Need for family participation in patient care:  Yes (Comment) (Daughter)  Care giving concerns:  Patient would benefit from [facility] placement.    Social Worker assessment / plan:  CSW was consulted by MD stating that patient will benefit from SNF placement at discharge. CSW met with patient at bedside. Introduced herself and her role. Per patient she has been to SNF in the past. Stated she went to  [facility] but she's interested in going to [facility]. Granted CSW verbal permission to send SNF referral to SNFs in [facility] and to contact her daughterOpal Sidles. Patient reported that she was tired and wanted to take a nap. CSW contacted patient's daughter  over the phone. Introduced herself and her role. Per patient's daughter she'd like patient to go to SNF. She informed CSW that she wanted patient to go to [facility] but was informed that patient's [facility] Medicare plan is out of network. Requested CSW to reach out to [facility].   FL2/ PASRR completed and faxed to SNFs in [facility]. Call to [facility]- Admissions Coordinator at [facility] and requested he  reviewed patient's referral. Awaiting phone call back. CSW will continue to follow and assist.   Employment status:  Retired Nurse, adult PT Recommendations:  [place] / Referral to community resources:  [place]  Patient/Family's Response to care:  Patient and her family are in agreement that patient will benefit from SNF at discharge.   Patient/Family's Understanding of and Emotional Response to Diagnosis, Current Treatment, and Prognosis:  Patient and her family understands her diagnosis and would like to go to SNF for PT. Informed CSW that they are grateful for her assistance.   Emotional Assessment Appearance:  Appears stated age Attitude/Demeanor/Rapport:   (None) Affect (typically observed):  Calm, Pleasant, Accepting Orientation:  Oriented to Self, Oriented to Place, Oriented to  Time, Oriented to Situation Alcohol / Substance use:  Not Applicable Psych involvement (Current and /or in the community):  No (Comment)  Discharge Needs  Concerns to be addressed:  Discharge Planning Concerns Readmission within the last 30 days:  No Current discharge risk:  Chronically ill Barriers to Discharge:  Continued Medical Work up   [employer], LCSW 05/19/2016, 2:53 PM

## 2016-05-19 NOTE — Evaluation (Signed)
Physical Therapy Evaluation Patient Details Name: Christina [name] MRN: [id number] DOB: 1920-02-15 Today's Date: 05/19/2016   History of Present Illness  Pt is a 82 y/o female who suffered a R hip fx needing an ORIF.  She had a fall with pelvic fracture 1 year ago.  Clinical Impression  Pt is a pleasant 82 y/o female who generally has been able to be active.  She is having a lot of pain in the R hip and struggles to do any real mobility with PT this AM.  She shows good effort with ~10 minutes of light exercises/PROM acts with LEs, but ultimately is in too much pain to fully participate.  She is eager to do more during PM session but cannot tolerate mobility at this time.   Follow Up Recommendations SNF    Equipment Recommendations       Recommendations for Other Services       Precautions / Restrictions Precautions Precautions: Fall Restrictions Weight Bearing Restrictions: Yes RLE Weight Bearing: Weight bearing as tolerated      Mobility  Bed Mobility               General bed mobility comments: Made attempt to get to EOB with a lot of assist, pt calling out in pain and too uncomfortable to even try getting to sitting.  Pt willing to particiapte - hoping to get pain meds coordinated for the PM session.  Transfers                 General transfer comment: unable secondary to pain  Ambulation/Gait                Stairs            Wheelchair Mobility    Modified Rankin (Stroke Patients Only)       Balance                                             Pertinent Vitals/Pain Pain Assessment: 0-10 Pain Score: 10-Worst pain ever Pain Location: R hip with any movement pt has severe pain reaction    Home Living Family/patient expects to be discharged to:: Skilled nursing facility Living Arrangements: Alone             Home Equipment: Bedside commode;Walker - 2 wheels;Walker - 4 wheels;Cane - single point      Prior  Function Level of Independence: Independent with assistive device(s)         Comments: Apparently she uses a walker when she first gets up, but mostly uses the cane     Hand Dominance        Extremity/Trunk Assessment   Upper Extremity Assessment: Generalized weakness (pt with minimal tolerance to do any moving)           Lower Extremity Assessment: Generalized weakness;RLE deficits/detail RLE Deficits / Details: Pt with no AROM in R hip or knee, has a lot of pain with any PROM       Communication   Communication: No difficulties  Cognition Arousal/Alertness: Awake/alert Behavior During Therapy: WFL for tasks assessed/performed Overall Cognitive Status: Within Functional Limits for tasks assessed                      General Comments      Exercises General Exercises - Lower Extremity Ankle Circles/Pumps: AROM;10 reps  Quad Sets: Strengthening;10 reps Gluteal Sets: Strengthening;10 reps Heel Slides: PROM;5 reps Hip ABduction/ADduction: PROM;5 reps      Assessment/Plan    PT Assessment Patient needs continued PT services  PT Diagnosis Difficulty walking;Generalized weakness;Acute pain   PT Problem List Decreased strength;Decreased range of motion;Decreased activity tolerance;Decreased balance;Decreased mobility;Decreased coordination;Decreased knowledge of use of DME;Decreased safety awareness;Pain  PT Treatment Interventions DME instruction;Gait training;Functional mobility training;Stair training;Therapeutic activities;Therapeutic exercise;Balance training;Neuromuscular re-education;Patient/family education   PT Goals (Current goals can be found in the Care Plan section) Acute Rehab PT Goals Patient Stated Goal: get back to walking PT Goal Formulation: With patient/family Time For Goal Achievement: 06/02/16 Potential to Achieve Goals: Fair    Frequency BID   Barriers to discharge        Co-evaluation               End of Session    Activity Tolerance: Patient limited by pain Patient left: with bed alarm set;with call bell/phone within reach;with family/visitor present Nurse Communication: Mobility status;Patient requests pain meds         Time: [id number] PT Time Calculation (min) (ACUTE ONLY): 31 min   Charges:   PT Evaluation $PT Eval Low Complexity: 1 Procedure PT Treatments $Therapeutic Exercise: 8-22 mins   PT G Codes:        Malachi ProGalen R Lazer Wollard, DPT  05/19/2016, 11:48 AM

## 2016-05-19 NOTE — Anesthesia Post-op Follow-up Note (Signed)
  Anesthesia Pain Follow-up Note  Patient: Christina RenshawRuth J Linquist  Day #: 1  Date of Follow-up: 05/19/2016 Time: 7:48 AM  Last Vitals:  Filed Vitals:   05/19/16 0737 05/19/16 0739  BP: 117/39 111/39  Pulse: 58 59  Temp: 36.7 C   Resp: 17     Level of Consciousness: alert  Pain: mild   Side Effects:None  Catheter Site Exam: site not evaluated  Plan: D/C from anesthesia care  Clydene PughBeane, Tremar Wickens D

## 2016-05-19 NOTE — Evaluation (Signed)
Occupational Therapy Evaluation Patient Details Name: Christina [name] MRN: [id number] DOB: December 08, 1920 Today's Date: 05/19/2016    History of Present Illness Pt is a 82 y/o female who suffered a R hip fx needing an ORIF.  She had a fall with pelvic fracture 1 year ago.     Clinical Impression  Pt. Is a 82 y.o.   Female who was admitted to [facility] for surgical intervention following a Right Hip Fracture s/p ORIF. Pt. presents with pain, limited ROM, weakness, and limited activity tolerance which hinder her ability to complete ADL tasks. Pt. could benefit from skilled OT services to review adaptive equipment training for LE ADLs, review work simplification techniques in order to improve ADL/IADL functioning and work towards returning to her PLOF.     Follow Up Recommendations  SNF    Equipment Recommendations       Recommendations for Other Services PT consult     Precautions / Restrictions Precautions Precautions: Fall Restrictions Weight Bearing Restrictions: Yes RLE Weight Bearing: Weight bearing as tolerated      Mobility Bed Mobility Overal bed mobility: Needs Assistance Bed Mobility: Supine to Sit     Supine to sit: Mod assist        Transfers                      Balance Overall balance assessment: Needs assistance   Sitting balance-Leahy Scale: Good                                      ADL Overall ADL's : Needs assistance/impaired Eating/Feeding: Minimal assistance   Grooming: Minimal assistance               Lower Body Dressing: Maximal assistance                       Vision     Perception     Praxis      Pertinent Vitals/Pain Pain Assessment: 0-10 Pain Score: 7      Hand Dominance Right   Extremity/Trunk Assessment Upper Extremity Assessment Upper Extremity Assessment: Generalized weakness (Right 3rd digit immobilization.)           Communication Communication Communication: No difficulties    Cognition Arousal/Alertness: Awake/alert Behavior During Therapy: WFL for tasks assessed/performed Overall Cognitive Status: Within Functional Limits for tasks assessed                     General Comments       Exercises       Shoulder Instructions      Home Living Family/patient expects to be discharged to:: Skilled nursing facility Living Arrangements: Alone                           Home Equipment: Bedside commode;Walker - 2 wheels;Walker - 4 wheels;Cane - single point          Prior Functioning/Environment Level of Independence: Independent with assistive device(s)             OT Diagnosis: Generalized weakness;Acute pain   OT Problem List: Decreased range of motion;Decreased activity tolerance;Decreased knowledge of use of DME or AE;Impaired UE functional use;Pain;Decreased coordination;Decreased strength   OT Treatment/Interventions: Self-care/ADL training;Therapeutic activities;Therapeutic exercise;Patient/family education;DME and/or AE instruction;Manual therapy    OT Goals(Current goals can be found in the  care plan section) Acute Rehab OT Goals Patient Stated Goal: Pt. get back to gardening OT Goal Formulation: With patient Time For Goal Achievement: 06/15/16 Potential to Achieve Goals: Good  OT Frequency: Min 2X/week   Barriers to D/C:            Co-evaluation              End of Session    Activity Tolerance: Patient tolerated treatment well Patient left: in bed;with call bell/phone within reach   Time: 1453-1523 OT Time Calculation (min): 30 min Charges:  OT General Charges $OT Visit: 1 Procedure OT Evaluation $OT Eval Moderate Complexity: 1 Procedure OT Treatments $Self Care/Home Management : 8-22 mins G-Codes:    Christina MessierElaine Kammi Hechler, MS, OTR/L  Christina [name] 05/19/2016, 3:41 PM

## 2016-05-19 NOTE — Care Management Important Message (Signed)
Important Message  Patient Details  Name: Wallie RenshawRuth J Novitsky MRN: [id number] Date of Birth: [birth year]   Medicare Important Message Given:  Yes    Collie SiadAngela Neytiri Asche, RN 05/19/2016, 11:38 AM

## 2016-05-19 NOTE — Progress Notes (Signed)
Patient ID: Christina [name], female   DOB: [birth year], 82 y.o.   MRN: [id number] [facility] [place] [facility]   PATIENT NAME: Christina [name]    MR#:  [id number]  DATE OF BIRTH:  10-29-1920  SUBJECTIVE:   dter in the room. Pt fast asleep. No new issues per RN Patient came in after she had a mechanical fall at home while trying to take care of the dog slipped and fell on the driveway REVIEW OF SYSTEMS:   Review of Systems  Constitutional: Negative for fever, chills and weight loss.  HENT: Negative for ear discharge, ear pain and nosebleeds.   Eyes: Negative for blurred vision, pain and discharge.  Respiratory: Negative for sputum production, shortness of breath, wheezing and stridor.   Cardiovascular: Negative for chest pain, palpitations, orthopnea and PND.  Gastrointestinal: Negative for nausea, vomiting, abdominal pain and diarrhea.  Genitourinary: Negative for urgency and frequency.  Musculoskeletal: Positive for joint pain. Negative for back pain.  Neurological: Positive for weakness. Negative for sensory change, speech change and focal weakness.  Psychiatric/Behavioral: Negative for depression and hallucinations. The patient is not nervous/anxious.   All other systems reviewed and are negative.  Tolerating Diet:yes Tolerating PT: pending  DRUG ALLERGIES:   Allergies  Allergen Reactions  . Atorvastatin Other (See Comments)    Reaction:  Blisters on feet   . Diltiazem Hcl Other (See Comments)    Reaction:  Unknown   . Dye Fdc Red [Red Dye] Other (See Comments)    Reaction:  Unknown   . Gemfibrozil Other (See Comments)    Reaction:  Unknown   . Niacin Other (See Comments)    Reaction:  Unknown   . Sulfasalazine Rash    VITALS:  Blood pressure 111/39, pulse 59, temperature 98 F (36.7 C), temperature source Oral, resp. rate 17, height  (1.549 m), weight 49.896 kg (110 lb), SpO2 95 %.  PHYSICAL EXAMINATION:   Physical Exam  GENERAL:  82  y.o.-year-old patient lying in the bed with no acute distress. thin EYES: Pupils equal, round, reactive to light and accommodation. No scleral icterus. Extraocular muscles intact.  HEENT: Head atraumatic, normocephalic. Oropharynx and nasopharynx clear.  NECK:  Supple, no jugular venous distention. No thyroid enlargement, no tenderness.  LUNGS: Normal breath sounds bilaterally, no wheezing, rales, rhonchi. No use of accessory muscles of respiration.  CARDIOVASCULAR: S1, S2 normal. No murmurs, rubs, or gallops.  ABDOMEN: Soft, nontender, nondistended. Bowel sounds present. No organomegaly or mass.  EXTREMITIES: No cyanosis, clubbing or edema b/l.    NEUROLOGIC: Cranial nerves II through XII are intact. No focal Motor or sensory deficits b/l.   PSYCHIATRIC:  patient is sleepy today SKIN: No obvious rash, lesion, or ulcer.   LABORATORY PANEL:  CBC  Recent Labs Lab 05/19/16 0425  WBC 14.6*  HGB 11.1*  HCT 33.0*  PLT 139*    Chemistries   Recent Labs Lab 05/18/16 0654  NA 140  K 3.7  CL 105  CO2 29  GLUCOSE 99  BUN 20  CREATININE 0.93  CALCIUM 9.3  AST 19  ALT 14  ALKPHOS 48  BILITOT 1.1   Cardiac Enzymes No results for input(s): TROPONINI in the last 168 hours. RADIOLOGY:  Dg Hip Port Unilat With Pelvis 1v Right  05/18/2016  CLINICAL DATA:  Status post surgical fixation for proximal femur fracture EXAM: DG HIP   2V PORT RIGHT COMPARISON:  May 17, 2016 FINDINGS: Frontal and lateral views were obtained  it. There is screw and rod fixation through a comminuted intertrochanteric femur fracture on the right. Alignment of the major fracture fragments is near anatomic. There is avulsion of the lesser trochanter on the right. Tip of the screw is in the proximal femoral head. No new fracture. No dislocation. There is mild narrowing of the right hip joint. Foci of soft tissue air are an expected postoperative finding. IMPRESSION: Postoperative fixation for comminuted proximal femur  fracture with major fracture fragments in near anatomic alignment. No dislocation. Narrowing right hip joint. Electronically Signed   By: Bretta BangWilliam  Woodruff III M.D.   On: 05/18/2016 10:34   Dg Hip Operative Unilat W Or W/o Pelvis Right  05/18/2016  CLINICAL DATA:  ORIF right femur EXAM: OPERATIVE RIGHT HIP (WITH PELVIS IF PERFORMED) 2 VIEWS TECHNIQUE: Fluoroscopic spot image(s) were submitted for interpretation post-operatively. COMPARISON:  05/17/2016 FINDINGS: Fluoroscopy Time:  1 minutes 12 seconds Number of Acquired Images:  6 Intertrochanteric right femur fracture status post intra medullary nail placement. Near anatomic position and alignment at the fracture site. IMPRESSION: ORIF right femur fracture Electronically Signed   By: Esperanza Heiraymond  Rubner M.D.   On: 05/18/2016 09:28   ASSESSMENT AND PLAN:  Christina [name] is a 82 y.o. female with a known history of coronary artery disease, hypertension, osteoporosis and peripheral vascular disease and multiple other medical problems was brought into the ED after she sustained a fall this a.m.Patient was walking her dog on the drive way and tripped over and fell on her right side. Patient is brought into the ED x-ray has revealed right intertrochanteric fracture and right third finger PIP dislocation. Dr. Martha ClanKrasinski is planning to do surgery in a.m.  #Right hip intertrochanteric fracture s/p surgery POD#1 Appreciate Dr. Samuel GermanyKrasinski's input Pt was seen by cardiology and cleared  or surgery. Pt tolerated it well Pain management as needed.  #History of coronary artery disease Continue Coreg and statin. Currently patient is asymptomatic. Resume aspirin   #Essential hypertension Resume home medication lisinopril and titrate as needed  #Hyperlipidemia continue statin-Crestor  #DVT prophylaxis with SCDs  CSW for d/c planing  Case discussed with Care Management/Social Worker. Management plans discussed with the patient, family and they are in  agreement.  CODE STATUS: DNR  DVT Prophylaxis: lovenox TOTAL TIME TAKING CARE OF THIS PATIENT: 30 minutes.  >50% time spent on counselling and coordination of care daughter  POSSIBLE D/C IN 1-2 DAYS, DEPENDING ON CLINICAL CONDITION.  Note: This dictation was prepared with Dragon dictation along with smaller phrase technology. Any transcriptional errors that result from this process are unintentional.  Suriya Kovarik M.D on 05/19/2016 at 11:11 AM  Between 7am to 6pm - Pager - [phone]  After 6pm go to [email or web address] - password [email or web address] [facility]  [facility]  Office  [phone]  CC: Primary care physician; Tillman Abideichard Letvak, MD

## 2016-05-19 NOTE — [place] FL2 (Signed)
[place] MEDICAID FL2 LEVEL OF CARE SCREENING TOOL     IDENTIFICATION  Patient Name: Christina RenshawRuth J Vanpelt Birthdate: 12/16/1920 Sex: female Admission Date (Current Location): 05/17/2016  [place]   Facility and Address:  [facility], [address], [place], [place] [address]      Provider Number: [id number]  Attending Physician Name and Address:  Enedina FinnerSona Patel, MD  Relative Name and Phone Number:       Current Level of Care: Hospital Recommended Level of Care: Skilled Nursing Facility Prior Approval Number:    Date Approved/Denied:   PASRR Number:  [id number])  Discharge Plan: SNF    Current Diagnoses: Patient Active Problem List   Diagnosis Date Noted  . Hip fracture, right (HCC) 05/17/2016  . Mild malnutrition (HCC) 03/06/2016  . Mood disorder (HCC) 09/06/2015  . Back pain without sciatica 07/26/2015  . Dizziness 07/05/2015  . Osteoarthritis, multiple sites 03/04/2015  . Advanced directives, counseling/discussion 09/04/2014  . Mild cognitive impairment 09/04/2014  . Routine general medical examination at a health care facility 08/26/2013  . Carotid arterial disease (HCC)   . Situational stress 02/12/2012  . Hyperlipidemia 12/04/2007  . Essential hypertension 06/04/2007  . Coronary artery disease with unspecified angina pectoris 06/04/2007  . Osteoporosis 06/04/2007    Orientation RESPIRATION BLADDER Height & Weight     Self, Time, Situation, Place  Normal Continent Weight: 110 lb (49.896 kg) Height:  5\' 1"  (154.9 cm)  BEHAVIORAL SYMPTOMS/MOOD NEUROLOGICAL BOWEL NUTRITION STATUS  Wanderer  (None) Continent Diet (Regular)  AMBULATORY STATUS COMMUNICATION OF NEEDS Skin   Extensive Assist Verbally Other (Comment) (Closed Incision Right Hip)                       Personal Care Assistance Level of Assistance  Bathing, Feeding, Dressing Bathing Assistance: Limited assistance Feeding assistance:  Independent Dressing Assistance: Limited assistance     Functional Limitations Info  Sight, Hearing, Speech Sight Info: Adequate Hearing Info: Adequate Speech Info: Adequate    SPECIAL CARE FACTORS FREQUENCY  PT (By licensed PT)     PT Frequency:  (5)              Contractures      Additional Factors Info  Code Status, Allergies Code Status Info:  (DNR) Allergies Info:  (Atorvastatin, Diltiazem Hcl, Dye Fdc Red, Gemfibrozil, Niacin, Sulfasalazine)           Current Medications (05/19/2016):  This is the current hospital active medication list Current Facility-Administered Medications  Medication Dose Route Frequency Provider Last Rate Last Dose  . 0.9 %  sodium chloride infusion  250 mL Intravenous PRN Aruna Gouru, MD      . 0.9 %  sodium chloride infusion  75 mL/hr Intravenous Continuous Juanell FairlyKevin Krasinski, MD 75 mL/hr at 05/18/16 1413 75 mL/hr at 05/18/16 1413  . acetaminophen (TYLENOL) tablet 650 mg  650 mg Oral Q6H PRN Ramonita LabAruna Gouru, MD      . aspirin EC tablet 81 mg  81 mg Oral Daily Ramonita LabAruna Gouru, MD   81 mg at 05/19/16 0923  . bisacodyl (DULCOLAX) suppository 10 mg  10 mg Rectal Daily PRN Juanell FairlyKevin Krasinski, MD      . carvedilol (COREG) tablet 3.125 mg  3.125 mg Oral BID WC Ramonita LabAruna Gouru, MD   3.125 mg at 05/18/16 2109  . enoxaparin (LOVENOX) injection 30 mg  30 mg Subcutaneous Q24H Juanell FairlyKevin Krasinski, MD   30 mg at 05/19/16 0924  .  gabapentin (NEURONTIN) capsule 300 mg  300 mg Oral TID Ramonita Lab, MD   300 mg at 05/19/16 0923  . HYDROcodone-acetaminophen (NORCO/VICODIN) 5-325 MG per tablet 1 tablet  1 tablet Oral Q6H PRN Ramonita Lab, MD   1 tablet at 05/19/16 0923  . isosorbide mononitrate (IMDUR) 24 hr tablet 30 mg  30 mg Oral Daily Ramonita Lab, MD   30 mg at 05/17/16 1530  . lisinopril (PRINIVIL,ZESTRIL) tablet 10 mg  10 mg Oral Daily Ramonita Lab, MD   10 mg at 05/17/16 1530  . meclizine (ANTIVERT) tablet 25 mg  25 mg Oral TID PRN Ramonita Lab, MD      .  menthol-cetylpyridinium (CEPACOL) lozenge 3 mg  1 lozenge Oral PRN [name], MD      . morphine 2 MG/ML injection 2 mg  2 mg Intravenous Q2H PRN [name], MD   2 mg at 05/18/16 2240  . multivitamin with minerals tablet 1 tablet  1 tablet Oral Daily Ramonita Lab, MD   1 tablet at 05/19/16 0923  . nitroGLYCERIN (NITROSTAT) SL tablet 0.4 mg  0.4 mg Sublingual Q5 min PRN Ramonita Lab, MD      . omega-3 acid ethyl esters (LOVAZA) capsule 1 g  1 capsule Oral Daily Ramonita Lab, MD   1 g at 05/19/16 0923  . ondansetron (ZOFRAN) tablet 4 mg  4 mg Oral Q6H PRN Ramonita Lab, MD       Or  . ondansetron (ZOFRAN) injection 4 mg  4 mg Intravenous Q6H PRN Ramonita Lab, MD   4 mg at 05/18/16 0552  . oxyCODONE (Oxy IR/ROXICODONE) immediate release tablet 5-10 mg  5-10 mg Oral Q4H PRN [name], MD   10 mg at 05/19/16 1328  . polyethylene glycol (MIRALAX / GLYCOLAX) packet 17 g  17 g Oral Daily PRN [name], MD      . rosuvastatin (CRESTOR) tablet 5 mg  5 mg Oral q1800 Ramonita Lab, MD   5 mg at 05/18/16 2110  . senna (SENOKOT) tablet 8.6 mg  1 tablet Oral BID [name], MD   8.6 mg at 05/19/16 0923  . sodium chloride flush (NS) 0.9 % injection 3 mL  3 mL Intravenous Q12H Ramonita Lab, MD   3 mL at 05/19/16 0924  . sodium chloride flush (NS) 0.9 % injection 3 mL  3 mL Intravenous PRN Aruna Gouru, MD      . sorbitol 70 % solution 30 mL  30 mL Oral Daily PRN Ramonita Lab, MD         Discharge Medications: Please see discharge summary for a list of discharge medications.  Relevant Imaging Results:  Relevant Lab Results:   Additional Information  (SSN [id number])  Verta Ellen Ryen Rhames, LCSW

## 2016-05-19 NOTE — Progress Notes (Signed)
Paged Dr. Allena KatzPatel, advised of blood pressure readings 117/39, instructed to hold blood pressure medications this shift.

## 2016-05-19 NOTE — Progress Notes (Signed)
  Subjective:  POD #1 s/p intramedullary fixation of right hip fracture.  Patient reports pain as mild.  Patient had pain with PT earlier and couldn't get out of bed.  Now sleeping but arousable.  Son is at the bedside.  Objective:   VITALS:   Filed Vitals:   05/19/16 0515 05/19/16 0626 05/19/16 0737 05/19/16 0739  BP: 99/40 115/39 117/39 111/39  Pulse: 66 60 58 59  Temp:   98 F (36.7 C)   TempSrc:   Oral   Resp:   17   Height:      Weight:      SpO2:   96% 95%    PHYSICAL EXAM:  Right hip/lower extremity:  Patient's dressing with minimal serosanguinous dressing.  Thigh compartments are soft and compressible.  Pedal pulses palpable.  Sensation intact to light touch.  Intact motor function in right lower extremity.   LABS  Results for orders placed or performed during the hospital encounter of 05/17/16 (from the past 24 hour(s))  CBC     Status: Abnormal   Collection Time: 05/19/16  4:25 AM  Result Value Ref Range   WBC 14.6 (H) 3.6 - 11.0 K/uL   RBC 3.43 (L) 3.80 - 5.20 MIL/uL   Hemoglobin 11.1 (L) 12.0 - 16.0 g/dL   HCT 16.133.0 (L) 09.635.0 - 04.547.0 %   MCV 96.1 80.0 - 100.0 fL   MCH 32.4 26.0 - 34.0 pg   MCHC 33.7 32.0 - 36.0 g/dL   RDW 40.913.6 81.111.5 - 91.414.5 %   Platelets 139 (L) 150 - 440 K/uL    Dg Hip Port Unilat With Pelvis 1v Right  05/18/2016  CLINICAL DATA:  Status post surgical fixation for proximal femur fracture EXAM: DG HIP   2V PORT RIGHT COMPARISON:  May 17, 2016 FINDINGS: Frontal and lateral views were obtained it. There is screw and rod fixation through a comminuted intertrochanteric femur fracture on the right. Alignment of the major fracture fragments is near anatomic. There is avulsion of the lesser trochanter on the right. Tip of the screw is in the proximal femoral head. No new fracture. No dislocation. There is mild narrowing of the right hip joint. Foci of soft tissue air are an expected postoperative finding. IMPRESSION: Postoperative fixation for comminuted  proximal femur fracture with major fracture fragments in near anatomic alignment. No dislocation. Narrowing right hip joint. Electronically Signed   By: Bretta BangWilliam  Woodruff III M.D.   On: 05/18/2016 10:34   Dg Hip Operative Unilat W Or W/o Pelvis Right  05/18/2016  CLINICAL DATA:  ORIF right femur EXAM: OPERATIVE RIGHT HIP (WITH PELVIS IF PERFORMED) 2 VIEWS TECHNIQUE: Fluoroscopic spot image(s) were submitted for interpretation post-operatively. COMPARISON:  05/17/2016 FINDINGS: Fluoroscopy Time:  1 minutes 12 seconds Number of Acquired Images:  6 Intertrochanteric right femur fracture status post intra medullary nail placement. Near anatomic position and alignment at the fracture site. IMPRESSION: ORIF right femur fracture Electronically Signed   By: Esperanza Heiraymond  Rubner M.D.   On: 05/18/2016 09:28    Assessment/Plan: 1 Day Post-Op   Active Problems:   Hip fracture, right (HCC)  Patient stable post-op.  Continue current pain mgt.  Foley catheter removed.  Complete 24 hours of post-op antibiotics.  Continue PT.    Juanell FairlyKRASINSKI, Linford Quintela , MD 05/19/2016, 1:13 PM

## 2016-05-19 NOTE — Progress Notes (Signed)
Physical Therapy Treatment Patient Details Name: Christina [name] MRN: [id number] DOB: 08-17-1920 Today's Date: 05/19/2016    History of Present Illness Pt is a 82 y/o female who suffered a R hip fx needing an ORIF.  She had a fall with pelvic fracture 1 year ago.    PT Comments    Attempted to organize pain meds with this afternoon session but pt was very lethargic during even basic exercises and fell asleep multiple times while PT was working with her.  Pt agreed that she needed to try to do more with PT tomorrow but stated that she couldn't do it today.  Pt does appear sincerely willing to participate but struggled to do much today.   Follow Up Recommendations  SNF     Equipment Recommendations       Recommendations for Other Services       Precautions / Restrictions Precautions Precautions: Fall Restrictions Weight Bearing Restrictions: Yes RLE Weight Bearing: Weight bearing as tolerated    Mobility  Bed Mobility Overal bed mobility: Needs Assistance Bed Mobility: Supine to Sit     Supine to sit: Mod assist     General bed mobility comments: Pt far too tired/lethargic to try getting to EOB.  She was unable to keep eyes open for more than a few seconds at a time  Transfers                    Ambulation/Gait                 Stairs            Wheelchair Mobility    Modified Rankin (Stroke Patients Only)       Balance Overall balance assessment: Needs assistance   Sitting balance-Leahy Scale: Good                              Cognition Arousal/Alertness: Lethargic Behavior During Therapy:  (Pt fell asleep multiple times during bed exercises) Overall Cognitive Status: Difficult to assess                      Exercises General Exercises - Lower Extremity Ankle Circles/Pumps: AROM;10 reps Quad Sets: AROM;Strengthening;10 reps Gluteal Sets: AROM;Strengthening;10 reps Short Arc Quad: AAROM;10 reps Heel Slides: 10  reps;AAROM Hip ABduction/ADduction: PROM;AAROM;10 reps    General Comments        Pertinent Vitals/Pain Pain Assessment:  (Pt indicated pain with movement, not nearly as bad as AM) Pain Score: 7     Home Living Family/patient expects to be discharged to:: Skilled nursing facility Living Arrangements: Alone           Home Equipment: Bedside commode;Walker - 2 wheels;Walker - 4 wheels;Cane - single point      Prior Function Level of Independence: Independent with assistive device(s)          PT Goals (current goals can now be found in the care plan section) Acute Rehab PT Goals Patient Stated Goal: Pt. get back to gardening    Frequency  BID    PT Plan Current plan remains appropriate    Co-evaluation             End of Session   Activity Tolerance: Patient limited by lethargy;Patient limited by pain Patient left: with bed alarm set;with call bell/phone within reach;with family/visitor present     Time: 1436-1450 PT Time Calculation (min) (ACUTE ONLY): 14  min  Charges:  $Therapeutic Exercise: 8-22 mins                    G Codes:      Malachi ProGalen R Reylene Stauder, DPT 05/19/2016, 4:49 PM

## 2016-05-20 LAB — CBC
HCT: 29.6 % — ABNORMAL LOW (ref 35.0–47.0)
HEMOGLOBIN: 9.8 g/dL — AB (ref 12.0–16.0)
MCH: 31.4 pg (ref 26.0–34.0)
MCHC: 33 g/dL (ref 32.0–36.0)
MCV: 95.3 fL (ref 80.0–100.0)
Platelets: 124 10*3/uL — ABNORMAL LOW (ref 150–440)
RBC: 3.11 MIL/uL — AB (ref 3.80–5.20)
RDW: 13.9 % (ref 11.5–14.5)
WBC: 11.2 10*3/uL — ABNORMAL HIGH (ref 3.6–11.0)

## 2016-05-20 NOTE — Clinical Social Work Note (Signed)
CSW spoke with patient and her daughter and extended bed offers. Patient and daughter chose [facility]. CSW will facilitate discharge when time. York SpanielMonica Shakeela Rabadan MSW,LCSW [phone]

## 2016-05-20 NOTE — Progress Notes (Signed)
Physical Therapy Treatment Patient Details Name: Christina RenshawRuth J [name] MRN: [id number] DOB: [birth year] Today's Date: 05/20/2016    History of Present Illness Pt is a 82 y/o female who suffered a R hip fx needing an ORIF.  She had a fall with pelvic fracture 1 year ago.    PT Comments    Pt was able to do some sitting/standing activities today but continues to have a lot of pain as well as generally being very weak and tired.  She struggles with any standing acts and essentially was unable to take even a small step despite heavy assist to try assisting with getting from bed to recliner.  Pt did show increased effort with decreased pain during bed exercises, but remains very weak in this area as well.   Follow Up Recommendations  SNF     Equipment Recommendations       Recommendations for Other Services       Precautions / Restrictions Precautions Precautions: Fall Restrictions RLE Weight Bearing: Weight bearing as tolerated    Mobility  Bed Mobility Overal bed mobility: Needs Assistance Bed Mobility: Supine to Sit     Supine to sit: Mod assist;Max assist     General bed mobility comments: Pt shows willingness to try but needs considerable assist to get to EOB.    Transfers Overall transfer level: Needs assistance Equipment used: Rolling walker (2 wheeled) Transfers: Sit to/from Stand Sit to Stand: Max assist         General transfer comment: Pt shows good effort but is very limited with what she is able to do/assist.  She initially was able to use UEs on walker to take some weight, but with attempts at weight shifting she lost concentration and essentially needed fully assist just to stay upright  Ambulation/Gait             General Gait Details: Pt with essentially no ability to move LEs to take even slide/shuffle steps.  She is very weak, pain limited and overall did not do well with attempts at standing mobility/walking/weight shifting   Stairs             Wheelchair Mobility    Modified Rankin (Stroke Patients Only)       Balance                                    Cognition Arousal/Alertness: Lethargic Behavior During Therapy: WFL for tasks assessed/performed Overall Cognitive Status: Within Functional Limits for tasks assessed                      Exercises General Exercises - Lower Extremity Ankle Circles/Pumps: AROM;10 reps Quad Sets: AROM;Strengthening;10 reps Gluteal Sets: AROM;Strengthening;10 reps Short Arc Quad: AAROM;10 reps Heel Slides: 10 reps;AAROM Hip ABduction/ADduction: PROM;AAROM;10 reps    General Comments        Pertinent Vitals/Pain Pain Score:  (does not rate, indicates it is severe with most movement)    Home Living                      Prior Function            PT Goals (current goals can now be found in the care plan section) Progress towards PT goals: Progressing toward goals (slowly)    Frequency  BID    PT Plan Current plan remains appropriate    Co-evaluation  End of Session Equipment Utilized During Treatment: Gait belt;Oxygen Activity Tolerance: Patient limited by pain;Patient limited by lethargy Patient left: with chair alarm set;with call bell/phone within reach     Time: 1152-1216 PT Time Calculation (min) (ACUTE ONLY): 24 min  Charges:  $Gait Training: 8-22 mins $Therapeutic Exercise: 8-22 mins                    G Codes:      Malachi Pro, DPT 05/20/2016, 2:17 PM

## 2016-05-20 NOTE — Progress Notes (Signed)
Patient ID: Christina [name], female   DOB: 08-17-1920, 82 y.o.   MRN: [id number] [facility] [place] at [facility]   PATIENT NAME: Christina [name]    MR#:  [id number]  DATE OF BIRTH:  08-17-1920  SUBJECTIVE:   Doing well  Mild low bp Patient came in after she had a mechanical fall at home while trying to take care of the dog slipped and fell on the driveway REVIEW OF SYSTEMS:   Review of Systems  Constitutional: Negative for fever, chills and weight loss.  HENT: Negative for ear discharge, ear pain and nosebleeds.   Eyes: Negative for blurred vision, pain and discharge.  Respiratory: Negative for sputum production, shortness of breath, wheezing and stridor.   Cardiovascular: Negative for chest pain, palpitations, orthopnea and PND.  Gastrointestinal: Negative for nausea, vomiting, abdominal pain and diarrhea.  Genitourinary: Negative for urgency and frequency.  Musculoskeletal: Positive for joint pain. Negative for back pain.  Neurological: Positive for weakness. Negative for sensory change, speech change and focal weakness.  Psychiatric/Behavioral: Negative for depression and hallucinations. The patient is not nervous/anxious.   All other systems reviewed and are negative.  Tolerating Diet:yes Tolerating PT: snF  DRUG ALLERGIES:   Allergies  Allergen Reactions  . Atorvastatin Other (See Comments)    Reaction:  Blisters on feet   . Diltiazem Hcl Other (See Comments)    Reaction:  Unknown   . Dye Fdc Red [Red Dye] Other (See Comments)    Reaction:  Unknown   . Gemfibrozil Other (See Comments)    Reaction:  Unknown   . Niacin Other (See Comments)    Reaction:  Unknown   . Sulfasalazine Rash    VITALS:  Blood pressure 122/42, pulse 124, temperature 98.2 F (36.8 C), temperature source Oral, resp. rate 16, height 5\' 1"  (1.549 m), weight 49.896 kg (110 lb), SpO2 95 %.  PHYSICAL EXAMINATION:   Physical Exam  GENERAL:  82 y.o.-year-old patient lying in  the bed with no acute distress. thin EYES: Pupils equal, round, reactive to light and accommodation. No scleral icterus. Extraocular muscles intact.  HEENT: Head atraumatic, normocephalic. Oropharynx and nasopharynx clear.  NECK:  Supple, no jugular venous distention. No thyroid enlargement, no tenderness.  LUNGS: Normal breath sounds bilaterally, no wheezing, rales, rhonchi. No use of accessory muscles of respiration.  CARDIOVASCULAR: S1, S2 normal. No murmurs, rubs, or gallops.  ABDOMEN: Soft, nontender, nondistended. Bowel sounds present. No organomegaly or mass.  EXTREMITIES: No cyanosis, clubbing or edema b/l.    NEUROLOGIC: Cranial nerves II through XII are intact. No focal Motor or sensory deficits b/l.   PSYCHIATRIC:  patient is sleepy today SKIN: No obvious rash, lesion, or ulcer.   LABORATORY PANEL:  CBC  Recent Labs Lab 05/20/16 0410  WBC 11.2*  HGB 9.8*  HCT 29.6*  PLT 124*    Chemistries   Recent Labs Lab 05/18/16 0654  NA 140  K 3.7  CL 105  CO2 29  GLUCOSE 99  BUN 20  CREATININE 0.93  CALCIUM 9.3  AST 19  ALT 14  ALKPHOS 48  BILITOT 1.1   Cardiac Enzymes No results for input(s): TROPONINI in the last 168 hours. RADIOLOGY:  No results found. ASSESSMENT AND PLAN:  Christina [name] is a 82 y.o. female with a known history of coronary artery disease, hypertension, osteoporosis and peripheral vascular disease and multiple other medical problems was brought into the ED after she sustained a fall this a.m.Patient was walking her  dog on the drive way and tripped over and fell on her right side. Patient is brought into the ED x-ray has revealed right intertrochanteric fracture and right third finger PIP dislocation. Dr. Martha Clan is planning to do surgery in a.m.  #Right hip intertrochanteric fracture s/p surgery POD#2 Appreciate Dr. Samuel Germany input Pt was seen by cardiology and cleared  or surgery. Pt tolerated it well Pain management as needed.  #History  of coronary artery disease Continue Coreg and statin. Currently patient is asymptomatic. Resume aspirin   #Essential hypertension Has low bp today will hold bp meds for today Resume home medication lisinopril once bp stable #Hyperlipidemia continue statin-Crestor  #DVT prophylaxis with SCDs  CSW for d/c planing  Case discussed with Care Management/Social Worker. Management plans discussed with the patient, family and they are in agreement.  CODE STATUS: DNR  DVT Prophylaxis: lovenox TOTAL TIME TAKING CARE OF THIS PATIENT: 30 minutes.  >50% time spent on counselling and coordination of care daughter  POSSIBLE D/C IN 1-2 DAYS, DEPENDING ON CLINICAL CONDITION.  Note: This dictation was prepared with Dragon dictation along with smaller phrase technology. Any transcriptional errors that result from this process are unintentional.  Aunesty Tyson M.D on 05/20/2016 at 10:39 AM  Between 7am to 6pm - Pager - [phone]  After 6pm go to [email or web address] - password [email or web address] [email or web address]  [place] [facility]  Office  [phone]  CC: Primary care physician; Tillman Abide, MD

## 2016-05-20 NOTE — Progress Notes (Signed)
Physical Therapy Treatment Patient Details Name: Christina [name] MRN: [id number] DOB: Jun 14, 1920 Today's Date: 05/20/2016    History of Present Illness Pt is a 82 y/o female who suffered a R hip fx needing an ORIF.  She had a fall with pelvic fracture 1 year ago.    PT Comments    Pt is sleepy but willing to work with PT.  She is struggling with the amount of pain she is having with any movement (minimal at rest) and how weak she feels generally and in the R hip.  Pt struggles with some of the bed exercises but does show good effort despite her discomfort and weakness.  She is able to assist lifting to sitting EOB but very much struggles with attempts at standing and trying to even move the feet minimally in WBing.  Pt exhausted after session despite not really being able to stand for 30 seconds even with max assist and heavy cuing.   Follow Up Recommendations  SNF     Equipment Recommendations       Recommendations for Other Services       Precautions / Restrictions Precautions Precautions: Fall Restrictions RLE Weight Bearing: Weight bearing as tolerated    Mobility  Bed Mobility Overal bed mobility: Needs Assistance Bed Mobility: Supine to Sit;Sit to Supine     Supine to sit: Mod assist Sit to supine: Max assist   General bed mobility comments: Pt able to assist with lifting torso better this afternoon, still requires heavy assist to get back to supine. Unable to lift R LE and having considerable pain  Transfers Overall transfer level: Needs assistance Equipment used: Rolling walker (2 wheeled) Transfers: Sit to/from Stand Sit to Stand: Max assist         General transfer comment: Pt continues to very much struggle with standing/WBing activites.  She was able to shuffle slide L LE minimally to the side but was unable to consistently use UEs to remain upright and take some weight, she called out in pain and became extremely tired with only a minimal amount of time being  upright.   Ambulation/Gait             General Gait Details: unable, pt eager to try but simply too weak and in pain   Stairs            Wheelchair Mobility    Modified Rankin (Stroke Patients Only)       Balance                                    Cognition Arousal/Alertness: Lethargic Behavior During Therapy: WFL for tasks assessed/performed Overall Cognitive Status: Within Functional Limits for tasks assessed                      Exercises General Exercises - Lower Extremity Ankle Circles/Pumps: AROM;10 reps;Strengthening Quad Sets: AROM;Strengthening;10 reps Gluteal Sets: AROM;Strengthening;10 reps Short Arc Quad: AAROM;10 reps Heel Slides: 10 reps;AAROM Hip ABduction/ADduction: AAROM;10 reps    General Comments        Pertinent Vitals/Pain Pain Assessment: 0-10 (minimal pain at rest ) Pain Score: 9     Home Living                      Prior Function            PT Goals (current goals can now  be found in the care plan section) Progress towards PT goals: Progressing toward goals    Frequency  BID    PT Plan Current plan remains appropriate    Co-evaluation             End of Session Equipment Utilized During Treatment: Gait belt;Oxygen Activity Tolerance: Patient limited by pain;Patient limited by lethargy Patient left: with bed alarm set;with call bell/phone within reach;with family/visitor present     Time: [id number] PT Time Calculation (min) (ACUTE ONLY): 27 min  Charges:  $Gait Training: 8-22 mins $Therapeutic Exercise: 8-22 mins $Therapeutic Activity: 8-22 mins                    G Codes:      Malachi ProGalen R Cleo Santucci, DPT  05/20/2016, 5:17 PM

## 2016-05-20 NOTE — Progress Notes (Signed)
Subjective: 2 Days Post-Op Procedure(s) (LRB): INTRAMEDULLARY (IM) NAIL INTERTROCHANTRIC (Right)    Patient reports pain as mild. Oriented and comfortable. Dressing dry.  hgb stable.   Objective:   VITALS:   Filed Vitals:   05/20/16 1640 05/20/16 1922  BP: 122/50 164/59  Pulse:  70  Temp:  99.2 F (37.3 C)  Resp:  16    Neurologically intact Neurovascular intact Sensation intact distally Intact pulses distally Dorsiflexion/Plantar flexion intact  LABS  Recent Labs  05/18/16 0654 05/19/16 0425 05/20/16 0410  HGB 12.5 11.1* 9.8*  HCT 37.8 33.0* 29.6*  WBC 13.0* 14.6* 11.2*  PLT 171 139* 124*     Recent Labs  05/18/16 0654  NA 140  K 3.7  BUN 20  CREATININE 0.93  GLUCOSE 99    No results for input(s): LABPT, INR in the last 72 hours.   Assessment/Plan: 2 Days Post-Op Procedure(s) (LRB): INTRAMEDULLARY (IM) NAIL INTERTROCHANTRIC (Right)   Advance diet Up with therapy D/C IV fluids Discharge to SNF

## 2016-05-21 LAB — CBC
HEMATOCRIT: 27.6 % — AB (ref 35.0–47.0)
HEMOGLOBIN: 9.3 g/dL — AB (ref 12.0–16.0)
MCH: 32 pg (ref 26.0–34.0)
MCHC: 33.6 g/dL (ref 32.0–36.0)
MCV: 95.3 fL (ref 80.0–100.0)
Platelets: 137 10*3/uL — ABNORMAL LOW (ref 150–440)
RBC: 2.9 MIL/uL — AB (ref 3.80–5.20)
RDW: 13.9 % (ref 11.5–14.5)
WBC: 9.6 10*3/uL (ref 3.6–11.0)

## 2016-05-21 LAB — CREATININE, SERUM
CREATININE: 0.87 mg/dL (ref 0.44–1.00)
GFR calc Af Amer: >60 mL/min (ref >60)
GFR, EST NON AFRICAN AMERICAN: 55 mL/min — AB (ref >60)

## 2016-05-21 MED ORDER — GABAPENTIN 300 MG PO CAPS: 300.0000 mg | ORAL_CAPSULE | Freq: Every day | ORAL | Status: DC

## 2016-05-21 NOTE — Care Management Important Message (Signed)
Important Message  Patient Details  Name: Christina RenshawRuth J Nordby MRN: [id number] Date of Birth: 08/08/1920   Medicare Important Message Given:  Yes    Cruz Devilla A, RN 05/21/2016, 2:35 PM

## 2016-05-21 NOTE — Progress Notes (Signed)
Physical Therapy Treatment Patient Details Name: Christina [name] MRN: [id number] DOB: 1920/11/16 Today's Date: 05/21/2016    History of Present Illness Pt is a 82 y/o female who suffered a R hip fx needing an ORIF.  She had a fall with pelvic fracture 1 year ago.    PT Comments    Pt by far has her best PT session but continues to be very weak and limited.  She continues to have a lot of pain, but ultimately is able to tolerate exercises and standing with less overt grimacing and pain.  She shows some more strength in R LE with exercises, but still requires AAROM with most of them.  She is able to actually lift each foot 1 time to step to recliner but she quickly becomes very fatigued with U&LEs and has buckling and is not at all stable or safe.   Follow Up Recommendations  SNF     Equipment Recommendations       Recommendations for Other Services       Precautions / Restrictions Precautions Precautions: Fall Restrictions RLE Weight Bearing: Weight bearing as tolerated    Mobility  Bed Mobility Overal bed mobility: Needs Assistance Bed Mobility: Supine to Sit     Supine to sit: Mod assist     General bed mobility comments: Pt shows less pain hesitancy with getting to and sitting at EOB.  She does still require considerable assist with the transition to sitting  Transfers Overall transfer level: Needs assistance Equipment used: Rolling walker (2 wheeled) Transfers: Sit to/from Stand Sit to Stand: Max assist         General transfer comment: Pt continues to require a lot of assist to get to standing but it does go much smoother this session.  She is still unable to consistently use UEs appropriately to keep torso upright and therefore weight off LEs to tolerance.   Ambulation/Gait Ambulation/Gait assistance: Max assist Ambulation Distance (Feet): 1 Feet Assistive device: Rolling walker (2 wheeled)       General Gait Details: Pt is actually able to move each foot 1  steps to get to recliner but still has UE and LE buckling and needs max assist essentially the entire time just to remain upright much less "ambulate"  Pt is very weak and despite great effort is very limited with standing/stepping tolerance   Stairs            Wheelchair Mobility    Modified Rankin (Stroke Patients Only)       Balance Overall balance assessment: Needs assistance   Sitting balance-Leahy Scale: Fair       Standing balance-Leahy Scale: Zero                      Cognition Arousal/Alertness: Awake/alert Behavior During Therapy: WFL for tasks assessed/performed Overall Cognitive Status: Within Functional Limits for tasks assessed                      Exercises General Exercises - Lower Extremity Ankle Circles/Pumps: AROM;10 reps;Strengthening Quad Sets: AROM;Strengthening;15 reps Gluteal Sets: AROM;Strengthening;15 reps Short Arc Quad: AAROM;15 reps Heel Slides: 10 reps;AAROM Hip ABduction/ADduction: AAROM;10 reps    General Comments        Pertinent Vitals/Pain Pain Score: 7  (minimal/no pain at rest)    Home Living                      Prior Function  PT Goals (current goals can now be found in the care plan section) Progress towards PT goals: Progressing toward goals    Frequency  BID    PT Plan Current plan remains appropriate    Co-evaluation             End of Session Equipment Utilized During Treatment: Gait belt;Oxygen Activity Tolerance: Patient limited by pain;Patient limited by lethargy Patient left: with bed alarm set;with call bell/phone within reach;with family/visitor present     Time: [id number] PT Time Calculation (min) (ACUTE ONLY): 34 min  Charges:  $Gait Training: 8-22 mins $Therapeutic Exercise: 8-22 mins                    G Codes:      Malachi ProGalen R Johnay Mano, DPT 05/21/2016, 2:47 PM

## 2016-05-21 NOTE — Progress Notes (Signed)
Bladder scanned to result in , with one urine occurrence. Patient has poor intake, lots of encouragement.

## 2016-05-21 NOTE — Progress Notes (Signed)
Pt c/o lower abdominal discomfort, bladder feels full and had voided 25cc early morning. Bladder scan done=47195ml. Prime doc Sheryle Hailiamond paged and ordered for in and out catheter. In and out cath done aseptically and was able to drain 750ml. Will continue to monitor.

## 2016-05-21 NOTE — Progress Notes (Addendum)
Patient ID: Christina [name], female   DOB: [birth year], 82 y.o.   MRN: [id number] [facility] - [place] at [facility]   PATIENT NAME: Christina [name]    MR#:  [id number]  DATE OF BIRTH:  07-17-1920  SUBJECTIVE:   Patient came in after she had a mechanical fall at home while trying to take care of the dog slipped and fell on the driveway Patient is lethargic and according to family members she takes gabapentin once a day at bedtime  REVIEW OF SYSTEMS:   Review of Systems  Unable to perform ROS Patient is lethargic  Tolerating Diet:yes Tolerating PT: snF  DRUG ALLERGIES:   Allergies  Allergen Reactions  . Atorvastatin Other (See Comments)    Reaction:  Blisters on feet   . Diltiazem Hcl Other (See Comments)    Reaction:  Unknown   . Dye Fdc Red [Red Dye] Other (See Comments)    Reaction:  Unknown   . Gemfibrozil Other (See Comments)    Reaction:  Unknown   . Niacin Other (See Comments)    Reaction:  Unknown   . Sulfasalazine Rash    VITALS:  Blood pressure 122/48, pulse 74, temperature 99 F (37.2 C), temperature source Oral, resp. rate 16, height  (1.549 m), weight 49.896 kg (110 lb), SpO2 94 %.  PHYSICAL EXAMINATION:   Physical Exam  GENERAL:  82 y.o.-year-old patient lying in the bed with no acute distress. thin EYES: Pupils equal, round, reactive to light and accommodation. No scleral icterus.  HEENT: Head atraumatic, normocephalic. Oropharynx and nasopharynx clear.  NECK:  Supple, no jugular venous distention. No thyroid enlargement, no tenderness.  LUNGS: Normal breath sounds bilaterally, no wheezing, rales, rhonchi. No use of accessory muscles of respiration.  CARDIOVASCULAR: S1, S2 normal. No murmurs, rubs, or gallops.  ABDOMEN: Soft, nontender, nondistended. Bowel sounds present. No organomegaly or mass.  EXTREMITIES: No cyanosis, clubbing or edema b/l.    NEUROLOGIC: Patient is lethargic PSYCHIATRIC:  patient is sleepy today SKIN: No  obvious rash, lesion, or ulcer.   LABORATORY PANEL:  CBC  Recent Labs Lab 05/21/16 0426  WBC 9.6  HGB 9.3*  HCT 27.6*  PLT 137*    Chemistries   Recent Labs Lab 05/18/16 0654 05/21/16 0426  NA 140  --   K 3.7  --   CL 105  --   CO2 29  --   GLUCOSE 99  --   BUN 20  --   CREATININE 0.93 0.87  CALCIUM 9.3  --   AST 19  --   ALT 14  --   ALKPHOS 48  --   BILITOT 1.1  --    Cardiac Enzymes No results for input(s): TROPONINI in the last 168 hours. RADIOLOGY:  No results found. ASSESSMENT AND PLAN:  Christina [name] is a 82 y.o. female with a known history of coronary artery disease, hypertension, osteoporosis and peripheral vascular disease and multiple other medical problems was brought into the ED after she sustained a fall this a.m.Patient was walking her dog on the drive way and tripped over and fell on her right side. Patient is brought into the ED x-ray has revealed right intertrochanteric fracture and right third finger PIP dislocation. Dr. Martha Clan is planning to do surgery in a.m.  #Right hip intertrochanteric fracture s/p surgery POD#3 Appreciate Dr. Samuel Germany input Pt was seen by cardiology and cleared  or surgery. Pt tolerated it well Pain management as needed.  #Altered mental status  could be from gabapentin Patient usually takes gabapentin to daily at bedtime and here she is getting 3 times a day which might be the etiology of her lethargy and family is concerned. Change gabapentin to by mouth daily at bedtime monitor closely  #History of coronary artery disease Continue Coreg and statin. Currently patient is asymptomatic. Resume aspirin   #Essential hypertension Resume home medication lisinopril as bp stable  #Hyperlipidemia continue statin-Crestor  #DVT prophylaxis with SCDs  CSW for d/c planing anticipating in a.m.  Case discussed with Care Management/Social Worker. Management plans discussed with the patient's family and they are in  agreement.  CODE STATUS: DNR  DVT Prophylaxis: lovenox TOTAL TIME TAKING CARE OF THIS PATIENT: 32 minutes.  >50% time spent on counselling and coordination of care daughter  POSSIBLE D/C IN 1-2 DAYS, DEPENDING ON CLINICAL CONDITION.  Note: This dictation was prepared with Dragon dictation along with smaller phrase technology. Any transcriptional errors that result from this process are unintentional.  Ramonita LabGouru, Christina [name] M.D on 05/21/2016 at 12:29 PM  Between 7am to 6pm - Pager - [phone]  After 6pm go to [email or web address] - password [email or web address]  [place] [facility]  Office  [phone]  CC: Primary care physician; Tillman Abideichard Letvak, MD

## 2016-05-22 ENCOUNTER — Encounter
Admission: RE | Admit: 2016-05-22 | Discharge: 2016-05-22 | Disposition: A | Discharge status: Home / Self Care | Payer: Medicare Other | Source: Ambulatory Visit | Attending: Internal Medicine | Admitting: Internal Medicine

## 2016-05-22 DIAGNOSIS — G629 Polyneuropathy, unspecified: Secondary | ICD-10-CM | POA: Diagnosis not present

## 2016-05-22 DIAGNOSIS — Z9181 History of falling: Secondary | ICD-10-CM | POA: Diagnosis not present

## 2016-05-22 DIAGNOSIS — R293 Abnormal posture: Secondary | ICD-10-CM | POA: Diagnosis not present

## 2016-05-22 DIAGNOSIS — Z7401 Bed confinement status: Secondary | ICD-10-CM | POA: Diagnosis not present

## 2016-05-22 DIAGNOSIS — R279 Unspecified lack of coordination: Secondary | ICD-10-CM | POA: Diagnosis not present

## 2016-05-22 DIAGNOSIS — Z4889 Encounter for other specified surgical aftercare: Secondary | ICD-10-CM | POA: Diagnosis not present

## 2016-05-22 DIAGNOSIS — I10 Essential (primary) hypertension: Secondary | ICD-10-CM | POA: Diagnosis not present

## 2016-05-22 DIAGNOSIS — M25559 Pain in unspecified hip: Secondary | ICD-10-CM | POA: Diagnosis not present

## 2016-05-22 DIAGNOSIS — S72001A Fracture of unspecified part of neck of right femur, initial encounter for closed fracture: Secondary | ICD-10-CM | POA: Diagnosis not present

## 2016-05-22 DIAGNOSIS — I739 Peripheral vascular disease, unspecified: Secondary | ICD-10-CM | POA: Diagnosis not present

## 2016-05-22 DIAGNOSIS — M6281 Muscle weakness (generalized): Secondary | ICD-10-CM | POA: Diagnosis not present

## 2016-05-22 DIAGNOSIS — M1990 Unspecified osteoarthritis, unspecified site: Secondary | ICD-10-CM | POA: Diagnosis not present

## 2016-05-22 DIAGNOSIS — S63252D Unspecified dislocation of right middle finger, subsequent encounter: Secondary | ICD-10-CM | POA: Diagnosis not present

## 2016-05-22 DIAGNOSIS — I2510 Atherosclerotic heart disease of native coronary artery without angina pectoris: Secondary | ICD-10-CM | POA: Diagnosis not present

## 2016-05-22 DIAGNOSIS — G9340 Encephalopathy, unspecified: Secondary | ICD-10-CM | POA: Diagnosis not present

## 2016-05-22 DIAGNOSIS — S72141D Displaced intertrochanteric fracture of right femur, subsequent encounter for closed fracture with routine healing: Secondary | ICD-10-CM | POA: Diagnosis not present

## 2016-05-22 DIAGNOSIS — N390 Urinary tract infection, site not specified: Secondary | ICD-10-CM | POA: Diagnosis not present

## 2016-05-22 DIAGNOSIS — M810 Age-related osteoporosis without current pathological fracture: Secondary | ICD-10-CM | POA: Diagnosis not present

## 2016-05-22 DIAGNOSIS — I25119 Atherosclerotic heart disease of native coronary artery with unspecified angina pectoris: Secondary | ICD-10-CM | POA: Diagnosis not present

## 2016-05-22 DIAGNOSIS — R262 Difficulty in walking, not elsewhere classified: Secondary | ICD-10-CM | POA: Diagnosis not present

## 2016-05-22 DIAGNOSIS — E785 Hyperlipidemia, unspecified: Secondary | ICD-10-CM | POA: Diagnosis not present

## 2016-05-22 MED ORDER — OXYCODONE HCL 5 MG PO TABS: 5.0000 mg | ORAL_TABLET | ORAL | Status: DC | PRN

## 2016-05-22 MED ORDER — MENTHOL 3 MG MT LOZG: 1.0000 | LOZENGE | OROMUCOSAL | Status: DC | PRN

## 2016-05-22 MED ORDER — BISACODYL 10 MG RE SUPP: 10.0000 mg | Freq: Every day | RECTAL | Status: DC | PRN

## 2016-05-22 MED ORDER — ENOXAPARIN SODIUM 30 MG/0.3ML ~~LOC~~ SOLN: 30.0000 mg | SUBCUTANEOUS | Status: DC

## 2016-05-22 MED ORDER — SENNA 8.6 MG PO TABS: 1.0000 | ORAL_TABLET | Freq: Two times a day (BID) | ORAL | Status: DC

## 2016-05-22 MED ORDER — GABAPENTIN 300 MG PO CAPS: 300.0000 mg | ORAL_CAPSULE | Freq: Every day | ORAL | Status: DC

## 2016-05-22 NOTE — Discharge Summary (Signed)
[facility] - [place] at [facility]   PATIENT NAME: Christina [name]    MR#:  [id number]  DATE OF BIRTH:  04-06-1920  DATE OF ADMISSION:  05/17/2016 ADMITTING PHYSICIAN: [name], MD  DATE OF DISCHARGE: 05/22/16 PRIMARY CARE PHYSICIAN: Tillman Abide, MD    ADMISSION DIAGNOSIS:  Closed dislocation of phalanx of hand, initial encounter [S63.259A] Intertrochanteric fracture of right hip, closed, initial encounter (HCC) [S72.141A]  DISCHARGE DIAGNOSIS:  Active Problems:   Hip fracture, right (HCC)   SECONDARY DIAGNOSIS:   Past Medical History  Diagnosis Date  . CAD (coronary artery disease)     a. S/P previous MI;  b. 04/2007 Cath/PCI: Taxus DES' to mid/distal RCA and RPDA as well as Ramus;  c. myoview 8/09: EF 78%, normal perfusion  . Hypertension   . Osteoporosis   . Peripheral vascular disease (HCC)   . Hyperlipidemia     Intolerant of many statins  . Chronic shoulder pain   . Chronic back pain   . Osteoarthritis   . Neuropathy (HCC)   . Mild mitral and aortic regurgitation     a. 11/2011 Echo: EF 55-65%, No RWMA, Gr 1 DD, Mild AI/MR.  . Carotid stenosis     Bilateral, mild to moderate  . Pelvic fracture [facility]) 6/16    HOSPITAL COURSE:  Christina [name] is a 82 y.o. female with a known history of coronary artery disease, hypertension, osteoporosis and peripheral vascular disease and multiple other medical problems was brought into the ED after she sustained a fall this a.m.Patient was walking her dog on the drive way and tripped over and fell on her right side. Patient is brought into the ED x-ray has revealed right intertrochanteric fracture and right third finger PIP dislocation. Dr. Martha Clan is planning to do surgery in a.m.  #Right hip intertrochanteric fracture s/p surgery POD#4 Appreciate Dr. Samuel Germany input Pt was seen by cardiology and cleared or surgery. Pt tolerated it well Pain management as needed. Discharge to rehabilitation  Center. Had bowel movement and Foley catheter was discontinued  #Altered mental status could be from gabapentin Patient usually takes gabapentin to daily at bedtime and here she is getting 3 times a day which might be the etiology of her lethargy and family is concerned. Change gabapentin to by mouth daily at bedtime monitor closely  #History of coronary artery disease Continue Coreg and statin. Currently patient is asymptomatic. Resume aspirin   #Essential hypertension Resume home medication lisinopril as bp stable  #Hyperlipidemia continue statin-Crestor  #DVT prophylaxis with SCDs   DISCHARGE CONDITIONS:   FAIR  CONSULTS OBTAINED:      PROCEDURES Right hip surgery  DRUG ALLERGIES:   Allergies  Allergen Reactions  . Atorvastatin Other (See Comments)    Reaction:  Blisters on feet   . Diltiazem Hcl Other (See Comments)    Reaction:  Unknown   . Dye Fdc Red [Red Dye] Other (See Comments)    Reaction:  Unknown   . Gemfibrozil Other (See Comments)    Reaction:  Unknown   . Niacin Other (See Comments)    Reaction:  Unknown   . Sulfasalazine Rash    DISCHARGE MEDICATIONS:   Current Discharge Medication List    START taking these medications   Details  bisacodyl (DULCOLAX) 10 MG suppository Place 1 suppository (10 mg total) rectally daily as needed for moderate constipation. Qty: 12 suppository, Refills: 0    enoxaparin (LOVENOX) 30 MG/0.3ML injection Inject 0.3 mLs (30 mg total) into  the skin daily. Qty: 14 Syringe, Refills: 0    menthol-cetylpyridinium (CEPACOL) 3 MG lozenge Take 1 lozenge (3 mg total) by mouth as needed for sore throat. Qty: 100 tablet, Refills: 12    oxyCODONE (OXY IR/ROXICODONE) 5 MG immediate release tablet Take 1-2 tablets (5-10 mg total) by mouth every 4 (four) hours as needed for breakthrough pain ((for MODERATE breakthrough pain)). Qty: 30 tablet, Refills: 0    senna (SENOKOT) 8.6 MG TABS tablet Take 1 tablet (8.6 mg total) by mouth  2 (two) times daily. Qty: 120 each, Refills: 0      CONTINUE these medications which have CHANGED   Details  gabapentin (NEURONTIN) 300 MG capsule Take 1 capsule (300 mg total) by mouth at bedtime. Qty: 30 capsule, Refills: 0      CONTINUE these medications which have NOT CHANGED   Details  acetaminophen (TYLENOL) 325 MG tablet Take 2 tablets (650 mg total) by mouth every 6 (six) hours as needed for mild pain (or Fever >/= 101). Qty: 60 tablet, Refills: 6    aspirin EC 81 MG tablet Take 81 mg by mouth daily.    carvedilol (COREG) 3.125 MG tablet Take 1 tablet by mouth two  times daily Qty: 180 tablet, Refills: 1    isosorbide mononitrate (IMDUR) 30 MG 24 hr tablet Take 1 tablet (30 mg total) by mouth daily. Qty: 90 tablet, Refills: 3    lisinopril (PRINIVIL,ZESTRIL) 10 MG tablet TAKE ONE TABLET BY MOUTH ONCE DAILY Qty: 30 tablet, Refills: 3    meclizine (ANTIVERT) 25 MG tablet Take 1 tablet (25 mg total) by mouth 3 (three) times daily as needed for dizziness. Qty: 30 tablet, Refills: 3    MEGARED OMEGA-3 KRILL OIL 500 MG CAPS Take 1 capsule by mouth daily.     Multiple Vitamins-Minerals (MULTIVITAMIN PO) Take 1 tablet by mouth daily.    nitroGLYCERIN (NITROSTAT) 0.4 MG SL tablet Place 1 tablet (0.4 mg total) under the tongue every 5 (five) minutes as needed for chest pain. Qty: 100 tablet, Refills: 1    rosuvastatin (CRESTOR) 5 MG tablet Take 1 tablet by mouth  daily Qty: 90 tablet, Refills: 1      STOP taking these medications     HYDROcodone-acetaminophen (NORCO/VICODIN) 5-325 MG per tablet          DISCHARGE INSTRUCTIONS:  Activity per PT Follow-up with primary care physician and orthopedics as recommended in a week Cardiac diet   DIET:  Cardiac diet  DISCHARGE CONDITION:  Fair  ACTIVITY:  Activity as tolerated  Per PT recommendations  OXYGEN:  Home Oxygen: no   Oxygen Delivery: room air  DISCHARGE LOCATION:  nursing home   If you experience  worsening of your admission symptoms, develop shortness of breath, life threatening emergency, suicidal or homicidal thoughts you must seek medical attention immediately by calling 911 or calling your MD immediately  if symptoms less severe.  You Must read complete instructions/literature along with all the possible adverse reactions/side effects for all the Medicines you take and that have been prescribed to you. Take any new Medicines after you have completely understood and accpet all the possible adverse reactions/side effects.   Please note  You were cared for by a hospitalist during your hospital stay. If you have any questions about your discharge medications or the care you received while you were in the hospital after you are discharged, you can call the unit and asked to speak with the hospitalist on call if the  hospitalist that took care of you is not available. Once you are discharged, your primary care physician will handle any further medical issues. Please note that NO REFILLS for any discharge medications will be authorized once you are discharged, as it is imperative that you return to your primary care physician (or establish a relationship with a primary care physician if you do not have one) for your aftercare needs so that they can reassess your need for medications and monitor your lab values.     Today  Chief Complaint  Patient presents with  . Fall  . Hip Pain  . Finger Injury   Pt is doing fine with no new complaints   ROS:  CONSTITUTIONAL: Denies fevers, chills. Denies any fatigue, weakness.  EYES: Denies blurry vision, double vision, eye pain. EARS, NOSE, THROAT: Denies tinnitus, ear pain, hearing loss. RESPIRATORY: Denies cough, wheeze, shortness of breath.  CARDIOVASCULAR: Denies chest pain, palpitations, edema.  GASTROINTESTINAL: Denies nausea, vomiting, diarrhea, abdominal pain. Denies bright red blood per rectum. GENITOURINARY: Denies dysuria,  hematuria. ENDOCRINE: Denies nocturia or thyroid problems. HEMATOLOGIC AND LYMPHATIC: Denies easy bruising or bleeding. SKIN: Denies rash or lesion. MUSCULOSKELETAL: Right hip pain is better Denies pain in neck, back, shoulder, knees  NEUROLOGIC: Denies paralysis, paresthesias.  PSYCHIATRIC: Denies anxiety or depressive symptoms.   VITAL SIGNS:  Blood pressure 157/75, pulse 73, temperature 98.4 F (36.9 C), temperature source Oral, resp. rate 16, height 5\' 1"  (1.549 m), weight 49.896 kg (110 lb), SpO2 98 %.  I/O:    Intake/Output Summary (Last 24 hours) at 05/22/16 1227 Last data filed at 05/22/16 0901  Gross per 24 hour  Intake    360 ml  Output    600 ml  Net   -240 ml    PHYSICAL EXAMINATION:  GENERAL:  82 y.o.-year-old patient lying in the bed with no acute distress.  EYES: Pupils equal, round, reactive to light and accommodation. No scleral icterus. Extraocular muscles intact.  HEENT: Head atraumatic, normocephalic. Oropharynx and nasopharynx clear.  NECK:  Supple, no jugular venous distention. No thyroid enlargement, no tenderness.  LUNGS: Normal breath sounds bilaterally, no wheezing, rales,rhonchi or crepitation. No use of accessory muscles of respiration.  CARDIOVASCULAR: S1, S2 normal. No murmurs, rubs, or gallops.  ABDOMEN: Soft, non-tender, non-distended. Bowel sounds present. No organomegaly or mass.  EXTREMITIES: Right hip status post surgery in clear dressing. No pedal edema, cyanosis, or clubbing.  NEUROLOGIC: Cranial nerves II through XII are intact. Muscle strength 5/5 in all extremities. Sensation intact. Gait not checked.  PSYCHIATRIC: The patient is alert and oriented x 3.  SKIN: No obvious rash, lesion, or ulcer.   DATA REVIEW:   CBC  Recent Labs Lab 05/21/16 0426  WBC 9.6  HGB 9.3*  HCT 27.6*  PLT 137*    Chemistries   Recent Labs Lab 05/18/16 0654 05/21/16 0426  NA 140  --   K 3.7  --   CL 105  --   CO2 29  --   GLUCOSE 99  --   BUN  20  --   CREATININE 0.93 0.87  CALCIUM 9.3  --   AST 19  --   ALT 14  --   ALKPHOS 48  --   BILITOT 1.1  --     Cardiac Enzymes No results for input(s): TROPONINI in the last 168 hours.  Microbiology Results  Results for orders placed or performed during the hospital encounter of 05/17/16  Urine culture  Status: None   Collection Time: 05/17/16  6:40 PM  Result Value Ref Range Status   Specimen Description URINE, RANDOM  Final   Special Requests NONE  Final   Culture NO GROWTH Performed at [facility]   Final   Report Status 05/18/2016 FINAL  Final  Surgical PCR screen     Status: None   Collection Time: 05/17/16  6:40 PM  Result Value Ref Range Status   MRSA, PCR NEGATIVE NEGATIVE Final   Staphylococcus aureus NEGATIVE NEGATIVE Final    Comment:        The Xpert SA Assay (FDA approved for NASAL specimens in patients over 15 years of age), is one component of a comprehensive surveillance program.  Test performance has been validated by [facility] for patients greater than or equal to 65 year old. It is not intended to diagnose infection nor to guide or monitor treatment.     RADIOLOGY:  No results found.  EKG:   Orders placed or performed during the hospital encounter of 05/17/16  . EKG 12-Lead  . EKG 12-Lead      Management plans discussed with the patient, family and they are in agreement.  CODE STATUS:     Code Status Orders        Start     Ordered   05/17/16 1517  Do not attempt resuscitation (DNR)   Continuous    Question Answer Comment  In the event of cardiac or respiratory ARREST Do not call a "code blue"   In the event of cardiac or respiratory ARREST Do not perform Intubation, CPR, defibrillation or ACLS   In the event of cardiac or respiratory ARREST Use medication by any route, position, wound care, and other measures to relive pain and suffering. May use oxygen, suction and manual treatment of airway obstruction as needed  for comfort.   Comments RN may pronounce      05/17/16 1517    Code Status History    Date Active Date Inactive Code Status Order ID Comments User Context   05/17/2016 12:43 PM 05/17/2016 12:43 PM Full Code [id number]  [name], MD ED   07/02/2015  1:50 PM 07/03/2015  3:33 PM DNR [id number]  Altamese Dilling, MD Inpatient   07/02/2015 11:29 AM 07/02/2015  1:50 PM Full Code [id number]  Altamese Dilling, MD Inpatient   06/09/2015  6:35 PM 06/11/2015  2:50 PM DNR [id number]  Auburn Bilberry, MD Inpatient    Advance Directive Documentation        Most Recent Value   Type of Advance Directive  Living will   Pre-existing out of facility DNR order (yellow form or pink MOST form)     "MOST" Form in Place?        TOTAL TIME TAKING CARE OF THIS PATIENT: 45  minutes.    @[facility] @  on 05/22/2016 at 12:27 PM  Between 7am to 6pm - Pager - [phone]  After 6pm go to [email or web address] - password [email or web address] [facility]  [facility] [place] [facility]  Office  [phone]  CC: Primary care physician; Tillman Abide, MD

## 2016-05-22 NOTE — Progress Notes (Signed)
Subjective: 4 Days Post-Op Procedure(s) (LRB): INTRAMEDULLARY (IM) NAIL INTERTROCHANTRIC (Right)    Patient reports pain as mild.  Objective:   VITALS:   Filed Vitals:   05/22/16 0350 05/22/16 0723  BP: 160/62 157/75  Pulse: 70 73  Temp: 97.9 F (36.6 C) 98.4 F (36.9 C)  Resp: 18 16    Neurologically intact ABD soft Neurovascular intact Sensation intact distally Intact pulses distally Dorsiflexion/Plantar flexion intact No cellulitis present  LABS  Recent Labs  05/20/16 0410 05/21/16 0426  HGB 9.8* 9.3*  HCT 29.6* 27.6*  WBC 11.2* 9.6  PLT 124* 137*     Recent Labs  05/21/16 0426  CREATININE 0.87    No results for input(s): LABPT, INR in the last 72 hours.   Assessment/Plan: 4 Days Post-Op Procedure(s) (LRB): INTRAMEDULLARY (IM) NAIL INTERTROCHANTRIC (Right)   Up with therapy Discharge to SNF

## 2016-05-22 NOTE — Progress Notes (Signed)
Physical Therapy Treatment Patient Details Name: Christina [name] MRN: [id number] DOB: January 22, 1920 Today's Date: 05/22/2016    History of Present Illness Pt is a 82 y/o female who suffered a R hip fx needing an ORIF.  She had a fall with pelvic fracture 1 year ago.    PT Comments    Pt in bed resting.  Requested pain medication prior to session.  Returned about 30 minutes later.  Reported 2/10 pain at rest 7/10 with movement.  Supine and seated exercises as described below.  To edge of bed with mod a x 1.  Stood with +2 mod assist and was able to transfer to chair at bedside with mod a +2 for safety.  Pt positioned to comfort in chair.     Follow Up Recommendations  SNF     Equipment Recommendations       Recommendations for Other Services       Precautions / Restrictions Precautions Precautions: Fall Restrictions Weight Bearing Restrictions: Yes RLE Weight Bearing: Weight bearing as tolerated    Mobility  Bed Mobility Overal bed mobility: Needs Assistance Bed Mobility: Supine to Sit     Supine to sit: Mod assist        Transfers Overall transfer level: Needs assistance Equipment used: Rolling walker (2 wheeled) Transfers: Sit to/from Stand Sit to Stand: Mod assist;+2 safety/equipment            Ambulation/Gait Ambulation/Gait assistance: Mod assist;+2 safety/equipment Ambulation Distance (Feet): 2 Feet Assistive device: Rolling walker (2 wheeled) Gait Pattern/deviations: Decreased stance time - right;Shuffle     General Gait Details: some knee buckling noted during stepping but able to remain standing with assist.   Stairs            Wheelchair Mobility    Modified Rankin (Stroke Patients Only)       Balance Overall balance assessment: Needs assistance Sitting-balance support: Feet supported Sitting balance-Leahy Scale: Fair     Standing balance support: Bilateral upper extremity supported Standing balance-Leahy Scale: Poor                       Cognition Arousal/Alertness: Awake/alert Behavior During Therapy: WFL for tasks assessed/performed Overall Cognitive Status: Within Functional Limits for tasks assessed                      Exercises General Exercises - Lower Extremity Ankle Circles/Pumps: AROM;10 reps;Strengthening Long Arc Quad: AROM;Right;10 reps;Seated Heel Slides: 10 reps;AAROM Hip ABduction/ADduction: AAROM;10 reps    General Comments        Pertinent Vitals/Pain Pain Assessment: 0-10 Pain Score: 7  Pain Location: R hip with movement, 2/10 at rest Pain Descriptors / Indicators: Aching;Operative site guarding Pain Intervention(s): Premedicated before session;Limited activity within patient's tolerance    Home Living                      Prior Function            PT Goals (current goals can now be found in the care plan section) Progress towards PT goals: Progressing toward goals    Frequency  BID    PT Plan Current plan remains appropriate    Co-evaluation             End of Session Equipment Utilized During Treatment: Gait belt;Oxygen Activity Tolerance: Patient limited by pain Patient left: in chair;with call bell/phone within reach;with chair alarm set;with SCD's reapplied  Time: 0940-1007 PT Time Calculation (min) (ACUTE ONLY): 27 min  Charges:  $Gait Training: 8-22 mins $Therapeutic Exercise: 8-22 mins                    G Codes:      Danielle DessSarah Kasumi Ditullio, PTA 05/22/2016, 10:48 AM

## 2016-05-22 NOTE — Progress Notes (Signed)
[facility]oke with Sam, [facility] at [phone], to notify of non-emergent EMS transport.  Auth notification reference given as [id number].   Service date range good from 05/22/16 - 08/20/16.   Gap exception requested to determine if services can be considered at an in-network level.

## 2016-05-22 NOTE — Consult Note (Signed)
   [facility] CM Inpatient Consult   05/22/2016  Christina RenshawRuth J [name] 04/17/1920 [id number]   Patient screened for potential [facility] Care Management services. Patient is eligible for [facility]. Electronic medical record reveals patient's discharge plan is SNF and there were no identifiable [facility] care [facility] needs at this time. [facility] services not appropriate at this time. If patient's post hospital needs change please place a [facility] Care Management consult. For questions please contact:   Lynda Wanninger RN, BSN [facility]  Hospital Liaison  [phone]) Business Mobile [phone]) Toll free office

## 2016-05-22 NOTE — Discharge Instructions (Signed)
Activity per PT Follow-up with primary care physician and orthopedics as recommended in a week Cardiac diet

## 2016-05-23 NOTE — Clinical Social Work Note (Signed)
Pt is ready for discharge today and [facility] as [facility] did not have a bed available at time of discharge. [name] was highly disappointed, however was agreeable to [facility]. Facility received discharge information and ready to admit pt. RN called report and EMS provided transportation. CSW is signing off as no further needs identified.   Dede QuerySarah Kamauri Denardo, MSW, LCSW  Clinical Social Worker  [phone]

## 2016-05-23 NOTE — Clinical Social Work Placement (Signed)
   CLINICAL SOCIAL WORK PLACEMENT  NOTE  Date:  05/23/2016  Patient Details  Name: Christina RenshawRuth J [name] MRN: [id number] Date of Birth: 1920-05-26  Clinical Social Work is seeking post-discharge placement for this patient at the Skilled  Nursing Facility level of care (*CSW will initial, date and re-position this form in  chart as items are completed):  Yes   Patient/family provided with [place] Clinical Social Work Department's list of facilities offering this level of care within the geographic area requested by the patient (or if unable, by the patient's family).  Yes   Patient/family informed of their freedom to choose among providers that offer the needed level of care, that participate in Medicare, Medicaid or managed care program needed by the patient, have an available bed and are willing to accept the patient.  Yes   Patient/family informed of 's ownership interest in [facility] and [facility], as well as of the fact that they are under no obligation to receive care at these facilities.  PASRR submitted to EDS on       PASRR number received on       Existing PASRR number confirmed on 05/19/16     FL2 transmitted to all facilities in geographic area requested by pt/family on 05/19/16     FL2 transmitted to all facilities within larger geographic area on       Patient informed that his/her managed care company has contracts with or will negotiate with certain facilities, including the following:        Yes   Patient/family informed of bed offers received.  [facility]     Physician recommends and patient chooses bed at  [facility](SNF)    [facility].  [facility]     Patient family notified on 05/23/16 of transfer.  Name of family member notified:  Carney BernJean, daughter     PHYSICIAN       Additional Comment:     _______________________________________________ Dede QuerySarah Rashay Barnette, LCSW 05/23/2016, 4:24 PM

## 2016-05-24 DIAGNOSIS — M810 Age-related osteoporosis without current pathological fracture: Secondary | ICD-10-CM | POA: Diagnosis not present

## 2016-05-24 DIAGNOSIS — I2510 Atherosclerotic heart disease of native coronary artery without angina pectoris: Secondary | ICD-10-CM | POA: Diagnosis not present

## 2016-05-24 DIAGNOSIS — G9340 Encephalopathy, unspecified: Secondary | ICD-10-CM | POA: Diagnosis not present

## 2016-05-24 DIAGNOSIS — I10 Essential (primary) hypertension: Secondary | ICD-10-CM | POA: Diagnosis not present

## 2016-05-31 DIAGNOSIS — Z4889 Encounter for other specified surgical aftercare: Secondary | ICD-10-CM | POA: Diagnosis not present

## 2016-06-02 ENCOUNTER — Other Ambulatory Visit
Admission: RE | Admit: 2016-06-02 | Discharge: 2016-06-02 | Disposition: A | Discharge status: Home / Self Care | Payer: Medicare Other | Source: Skilled Nursing Facility | Attending: Internal Medicine | Admitting: Internal Medicine

## 2016-06-02 DIAGNOSIS — N390 Urinary tract infection, site not specified: Secondary | ICD-10-CM | POA: Insufficient documentation

## 2016-06-02 LAB — URINALYSIS COMPLETE WITH MICROSCOPIC (ARMC ONLY)
BILIRUBIN URINE: NEGATIVE
Bacteria, UA: NONE SEEN
Glucose, UA: NEGATIVE mg/dL
HGB URINE DIPSTICK: NEGATIVE
KETONES UR: NEGATIVE mg/dL
NITRITE: NEGATIVE
PH: 6 (ref 5.0–8.0)
PROTEIN: NEGATIVE mg/dL
RBC / HPF: NONE SEEN RBC/hpf (ref 0–5)
SPECIFIC GRAVITY, URINE: 1.006 (ref 1.005–1.030)

## 2016-06-03 LAB — URINE CULTURE: Culture: <10000 — AB

## 2016-06-16 DIAGNOSIS — S72001D Fracture of unspecified part of neck of right femur, subsequent encounter for closed fracture with routine healing: Secondary | ICD-10-CM | POA: Diagnosis not present

## 2016-06-16 DIAGNOSIS — I10 Essential (primary) hypertension: Secondary | ICD-10-CM | POA: Diagnosis not present

## 2016-06-16 DIAGNOSIS — E785 Hyperlipidemia, unspecified: Secondary | ICD-10-CM | POA: Diagnosis not present

## 2016-06-16 DIAGNOSIS — R569 Unspecified convulsions: Secondary | ICD-10-CM | POA: Diagnosis not present

## 2016-06-16 DIAGNOSIS — R531 Weakness: Secondary | ICD-10-CM | POA: Diagnosis not present

## 2016-06-16 DIAGNOSIS — M810 Age-related osteoporosis without current pathological fracture: Secondary | ICD-10-CM | POA: Diagnosis not present

## 2016-06-16 DIAGNOSIS — I739 Peripheral vascular disease, unspecified: Secondary | ICD-10-CM | POA: Diagnosis not present

## 2016-06-19 DIAGNOSIS — R569 Unspecified convulsions: Secondary | ICD-10-CM | POA: Diagnosis not present

## 2016-06-19 DIAGNOSIS — S72001D Fracture of unspecified part of neck of right femur, subsequent encounter for closed fracture with routine healing: Secondary | ICD-10-CM | POA: Diagnosis not present

## 2016-06-19 DIAGNOSIS — R531 Weakness: Secondary | ICD-10-CM | POA: Diagnosis not present

## 2016-06-19 DIAGNOSIS — I739 Peripheral vascular disease, unspecified: Secondary | ICD-10-CM | POA: Diagnosis not present

## 2016-06-19 DIAGNOSIS — E785 Hyperlipidemia, unspecified: Secondary | ICD-10-CM | POA: Diagnosis not present

## 2016-06-19 DIAGNOSIS — I10 Essential (primary) hypertension: Secondary | ICD-10-CM | POA: Diagnosis not present

## 2016-06-19 DIAGNOSIS — M810 Age-related osteoporosis without current pathological fracture: Secondary | ICD-10-CM | POA: Diagnosis not present

## 2016-06-21 DIAGNOSIS — R531 Weakness: Secondary | ICD-10-CM | POA: Diagnosis not present

## 2016-06-21 DIAGNOSIS — E785 Hyperlipidemia, unspecified: Secondary | ICD-10-CM | POA: Diagnosis not present

## 2016-06-21 DIAGNOSIS — I10 Essential (primary) hypertension: Secondary | ICD-10-CM | POA: Diagnosis not present

## 2016-06-21 DIAGNOSIS — M810 Age-related osteoporosis without current pathological fracture: Secondary | ICD-10-CM | POA: Diagnosis not present

## 2016-06-21 DIAGNOSIS — R569 Unspecified convulsions: Secondary | ICD-10-CM | POA: Diagnosis not present

## 2016-06-21 DIAGNOSIS — S72001D Fracture of unspecified part of neck of right femur, subsequent encounter for closed fracture with routine healing: Secondary | ICD-10-CM | POA: Diagnosis not present

## 2016-06-21 DIAGNOSIS — I739 Peripheral vascular disease, unspecified: Secondary | ICD-10-CM | POA: Diagnosis not present

## 2016-06-27 DIAGNOSIS — I10 Essential (primary) hypertension: Secondary | ICD-10-CM | POA: Diagnosis not present

## 2016-06-27 DIAGNOSIS — R531 Weakness: Secondary | ICD-10-CM | POA: Diagnosis not present

## 2016-06-27 DIAGNOSIS — M810 Age-related osteoporosis without current pathological fracture: Secondary | ICD-10-CM | POA: Diagnosis not present

## 2016-06-27 DIAGNOSIS — I739 Peripheral vascular disease, unspecified: Secondary | ICD-10-CM | POA: Diagnosis not present

## 2016-06-27 DIAGNOSIS — E785 Hyperlipidemia, unspecified: Secondary | ICD-10-CM | POA: Diagnosis not present

## 2016-06-27 DIAGNOSIS — S72001D Fracture of unspecified part of neck of right femur, subsequent encounter for closed fracture with routine healing: Secondary | ICD-10-CM | POA: Diagnosis not present

## 2016-06-27 DIAGNOSIS — R569 Unspecified convulsions: Secondary | ICD-10-CM | POA: Diagnosis not present

## 2016-06-30 DIAGNOSIS — R569 Unspecified convulsions: Secondary | ICD-10-CM | POA: Diagnosis not present

## 2016-06-30 DIAGNOSIS — S72001D Fracture of unspecified part of neck of right femur, subsequent encounter for closed fracture with routine healing: Secondary | ICD-10-CM | POA: Diagnosis not present

## 2016-06-30 DIAGNOSIS — M810 Age-related osteoporosis without current pathological fracture: Secondary | ICD-10-CM | POA: Diagnosis not present

## 2016-06-30 DIAGNOSIS — R531 Weakness: Secondary | ICD-10-CM | POA: Diagnosis not present

## 2016-06-30 DIAGNOSIS — I739 Peripheral vascular disease, unspecified: Secondary | ICD-10-CM | POA: Diagnosis not present

## 2016-06-30 DIAGNOSIS — E785 Hyperlipidemia, unspecified: Secondary | ICD-10-CM | POA: Diagnosis not present

## 2016-06-30 DIAGNOSIS — I10 Essential (primary) hypertension: Secondary | ICD-10-CM | POA: Diagnosis not present

## 2016-07-03 DIAGNOSIS — R569 Unspecified convulsions: Secondary | ICD-10-CM | POA: Diagnosis not present

## 2016-07-03 DIAGNOSIS — S72001D Fracture of unspecified part of neck of right femur, subsequent encounter for closed fracture with routine healing: Secondary | ICD-10-CM | POA: Diagnosis not present

## 2016-07-03 DIAGNOSIS — R531 Weakness: Secondary | ICD-10-CM | POA: Diagnosis not present

## 2016-07-03 DIAGNOSIS — I739 Peripheral vascular disease, unspecified: Secondary | ICD-10-CM | POA: Diagnosis not present

## 2016-07-03 DIAGNOSIS — E785 Hyperlipidemia, unspecified: Secondary | ICD-10-CM | POA: Diagnosis not present

## 2016-07-03 DIAGNOSIS — I10 Essential (primary) hypertension: Secondary | ICD-10-CM | POA: Diagnosis not present

## 2016-07-03 DIAGNOSIS — M810 Age-related osteoporosis without current pathological fracture: Secondary | ICD-10-CM | POA: Diagnosis not present

## 2016-07-07 DIAGNOSIS — E785 Hyperlipidemia, unspecified: Secondary | ICD-10-CM | POA: Diagnosis not present

## 2016-07-07 DIAGNOSIS — R531 Weakness: Secondary | ICD-10-CM | POA: Diagnosis not present

## 2016-07-07 DIAGNOSIS — I739 Peripheral vascular disease, unspecified: Secondary | ICD-10-CM | POA: Diagnosis not present

## 2016-07-07 DIAGNOSIS — R569 Unspecified convulsions: Secondary | ICD-10-CM | POA: Diagnosis not present

## 2016-07-07 DIAGNOSIS — M810 Age-related osteoporosis without current pathological fracture: Secondary | ICD-10-CM | POA: Diagnosis not present

## 2016-07-07 DIAGNOSIS — I10 Essential (primary) hypertension: Secondary | ICD-10-CM | POA: Diagnosis not present

## 2016-07-07 DIAGNOSIS — S72001D Fracture of unspecified part of neck of right femur, subsequent encounter for closed fracture with routine healing: Secondary | ICD-10-CM | POA: Diagnosis not present

## 2016-07-11 DIAGNOSIS — Z4889 Encounter for other specified surgical aftercare: Secondary | ICD-10-CM | POA: Diagnosis not present

## 2016-08-04 ENCOUNTER — Telehealth: Payer: Self-pay | Admitting: Internal Medicine

## 2016-08-04 NOTE — Telephone Encounter (Signed)
I wasn't aware that the post office would do this on a standard route. Please confirm they will before I write the letter

## 2016-08-04 NOTE — Telephone Encounter (Signed)
Carney BernJean (daughter) called wanting to get a letter from dr to have pt mailed delivered to her home Carney BernJean found her the other day in the road with her walker getting her mail

## 2016-08-10 NOTE — Telephone Encounter (Signed)
Christina CushingBill Pulido (do not see DPR) left v/m requesting letter to have mail box placed at pts door instead of at street since pt had fx hip 3 months ago and cannot collect mail at street.

## 2016-08-10 NOTE — Telephone Encounter (Signed)
I tried to call the post office. There was a 45 - 50 min wait for a representative to answer. I believe it would not hurt to go ahead and write the letter. Hopefully, the pt and her family has already contacted the [employer].

## 2016-08-11 ENCOUNTER — Encounter: Payer: Self-pay | Admitting: Internal Medicine

## 2016-08-11 NOTE — Telephone Encounter (Signed)
Spoke to [place]. Letter up front

## 2016-08-11 NOTE — Telephone Encounter (Signed)
Christina [name] came in asking about the letter. She stated they have contacted the post office and they have to have a letter before [employer] will consider doing this. They will then send someone to the house before moving it.

## 2016-08-11 NOTE — Telephone Encounter (Signed)
Letter done No charge 

## 2016-08-23 ENCOUNTER — Other Ambulatory Visit: Payer: Self-pay

## 2016-08-23 MED ORDER — NITROGLYCERIN 0.4 MG SL SUBL: 0.4000 mg | SUBLINGUAL_TABLET | SUBLINGUAL | 2 refills | Status: DC | PRN

## 2016-08-28 DIAGNOSIS — H3412 Central retinal artery occlusion, left eye: Secondary | ICD-10-CM | POA: Diagnosis not present

## 2016-10-09 ENCOUNTER — Ambulatory Visit: Payer: Medicare Other | Admitting: Internal Medicine

## 2016-10-16 ENCOUNTER — Ambulatory Visit: Payer: Medicare Other | Admitting: Internal Medicine

## 2016-10-30 ENCOUNTER — Ambulatory Visit (INDEPENDENT_AMBULATORY_CARE_PROVIDER_SITE_OTHER): Payer: Medicare Other | Admitting: Cardiovascular Disease

## 2016-10-30 ENCOUNTER — Encounter: Payer: Self-pay | Admitting: Cardiovascular Disease

## 2016-10-30 VITALS — BP 136/68 | HR 59 | Ht 60.0 in | Wt 112.5 lb

## 2016-10-30 DIAGNOSIS — E7800 Pure hypercholesterolemia, unspecified: Secondary | ICD-10-CM

## 2016-10-30 DIAGNOSIS — M461 Sacroiliitis, not elsewhere classified: Secondary | ICD-10-CM | POA: Diagnosis not present

## 2016-10-30 DIAGNOSIS — I25119 Atherosclerotic heart disease of native coronary artery with unspecified angina pectoris: Secondary | ICD-10-CM

## 2016-10-30 DIAGNOSIS — I779 Disorder of arteries and arterioles, unspecified: Secondary | ICD-10-CM | POA: Diagnosis not present

## 2016-10-30 DIAGNOSIS — I739 Peripheral vascular disease, unspecified: Principal | ICD-10-CM

## 2016-10-30 DIAGNOSIS — S72141D Displaced intertrochanteric fracture of right femur, subsequent encounter for closed fracture with routine healing: Secondary | ICD-10-CM | POA: Diagnosis not present

## 2016-10-30 DIAGNOSIS — I10 Essential (primary) hypertension: Secondary | ICD-10-CM

## 2016-10-30 NOTE — Progress Notes (Signed)
Cardiology Office Note   Date:  10/30/2016   ID:  Christina [name], DOB [birth year], MRN [id number]  PCP:  Tillman Abide, MD  Cardiologist:   Lorine Bears, MD   Chief Complaint  Patient presents with  . other    6 month follow up. Meds reviewed by the pt. verbally. Pt. c/o chest pain at times.       History of Present Illness: Christina [name] is a 82 y.o. female who presents for a followup visit regarding coronary artery disease. She had an MI in 5/08 with PCI to ramus and RCA.  She had an echocardiogram done in 2013 which showed normal LV systolic function with mild mitral and aortic insufficiency. Nuclear stress test in 2014 showed no evidence of ischemia with normal ejection fraction. There was a fixed basal lateral wall defect likely due to a prior small infarct. She was noted to have faint carotid bruits. Carotid duplex showed mild to moderate disease bilaterally.  She had recurrent falls since 2016 most recently in May 2017 which resulted in hip fracture. She underwent surgical repair and was discharged to rehabilitation. She is able to walk with a walker does not use it all the time. She frequently walks without any assistive devices. She denies any chest pain or shortness of breath. She continues to be active and lives by herself with her dog. She works around her yard and maintains a Art therapist garden.   Past Medical History:  Diagnosis Date  . CAD (coronary artery disease)    a. S/P previous MI;  b. 04/2007 Cath/PCI: Taxus DES' to mid/distal RCA and RPDA as well as Ramus;  c. myoview 8/09: EF 78%, normal perfusion  . Carotid stenosis    Bilateral, mild to moderate  . Chronic back pain   . Chronic shoulder pain   . Hyperlipidemia    Intolerant of many statins  . Hypertension   . Mild mitral and aortic regurgitation    a. 11/2011 Echo: EF 55-65%, No RWMA, Gr 1 DD, Mild AI/MR.  Marland Kitchen Neuropathy (HCC)   . Osteoarthritis   . Osteoporosis   . Pelvic fracture (HCC) 6/16  .  Peripheral vascular disease [facility])     Past Surgical History:  Procedure Laterality Date  . ADENOSINE MYOVIEW  01/2006   No ischemia, EF 70%  . BREAST BIOPSY     Bilateral  . CAROTID U/S  10/2006   0-39% bilaterally  . CATARACT EXTRACTION  09/2006   Right  . CORONARY ANGIOPLASTY  12/2002   Medical rx only  . CORONARY ANGIOPLASTY  01/2006   LAD/PAD disease  . CORONARY ANGIOPLASTY  03/2007   3 vessel disease  . CORONARY STENT PLACEMENT  2002   MI, [facility]  . CORONARY STENT PLACEMENT  04/2007   RCA/LCX multiple, [facility]  . CP ADMIT  09/2005   Stress myoview negative  . FEMORAL HERNIA REPAIR  05/2007   Incarcerated, right  . FRACTURE SURGERY  03/2009   Left leg  . INTRAMEDULLARY (IM) NAIL INTERTROCHANTERIC Right 05/18/2016   Procedure: INTRAMEDULLARY (IM) NAIL INTERTROCHANTRIC;  Surgeon: [name], MD;  Location: [facility] ORS;  Service: Orthopedics;  Laterality: Right;  . NM MYOVIEW LTD  11/2004   Stress, negative; NL EF  . VESICOVAGINAL FISTULA CLOSURE W/ TAH       Current Outpatient Prescriptions  Medication Sig Dispense Refill  . acetaminophen (TYLENOL) 325 MG tablet Take 2 tablets (650 mg total) by mouth every 6 (six) hours as needed  for mild pain (or Fever >/= 101). 60 tablet 6  . aspirin EC 81 MG tablet Take 81 mg by mouth daily.    . bisacodyl (DULCOLAX) 10 MG suppository Place 1 suppository (10 mg total) rectally daily as needed for moderate constipation. 12 suppository 0  . carvedilol (COREG) 3.125 MG tablet Take 1 tablet by mouth two  times daily 180 tablet 1  . gabapentin (NEURONTIN) 300 MG capsule Take 1 capsule (300 mg total) by mouth at bedtime. 30 capsule 0  . isosorbide mononitrate (IMDUR) 30 MG 24 hr tablet Take 1 tablet (30 mg total) by mouth daily. 90 tablet 3  . lisinopril (PRINIVIL,ZESTRIL) 10 MG tablet TAKE ONE TABLET BY MOUTH ONCE DAILY 30 tablet 3  . meclizine (ANTIVERT) 25 MG tablet Take 1 tablet (25 mg total) by mouth 3 (three) times daily as needed for  dizziness. 30 tablet 3  . MEGARED OMEGA-3 KRILL OIL 500 MG CAPS Take 1 capsule by mouth daily.     Marland Kitchen. menthol-cetylpyridinium (CEPACOL) 3 MG lozenge Take 1 lozenge (3 mg total) by mouth as needed for sore throat. 100 tablet 12  . Multiple Vitamins-Minerals (MULTIVITAMIN PO) Take 1 tablet by mouth daily.    . nitroGLYCERIN (NITROSTAT) 0.4 MG SL tablet Place 1 tablet (0.4 mg total) under the tongue every 5 (five) minutes as needed for chest pain. 25 tablet 2  . oxyCODONE (OXY IR/ROXICODONE) 5 MG immediate release tablet Take 1-2 tablets (5-10 mg total) by mouth every 4 (four) hours as needed for breakthrough pain ((for MODERATE breakthrough pain)). 30 tablet 0  . rosuvastatin (CRESTOR) 5 MG tablet Take 1 tablet by mouth  daily 90 tablet 1  . senna (SENOKOT) 8.6 MG TABS tablet Take 1 tablet (8.6 mg total) by mouth 2 (two) times daily. 120 each 0   No current facility-administered medications for this visit.     Allergies:   Atorvastatin; Diltiazem hcl; Dye fdc red [red dye]; Gemfibrozil; Niacin; and Sulfasalazine    Social History:  The patient  reports that she has never smoked. She has never used smokeless tobacco. She reports that she does not drink alcohol or use drugs.   Family History:  The patient's family history includes Cancer in her brother; Heart disease in her father, sister, and sister; Pulmonary embolism in her mother.    ROS:  Please see the history of present illness.   Otherwise, review of systems are positive for none.   All other systems are reviewed and negative.    PHYSICAL EXAM: VS:  BP 136/68 (BP Location: Left Arm, Patient Position: Sitting, Cuff Size: Normal)   Pulse (!) 59   Ht 5' (1.524 m)   Wt 112 lb 8 oz (51 kg)   BMI 21.97 kg/m  , BMI Body mass index is 21.97 kg/m. GEN: Well nourished, well developed, in no acute distress HEENT: normal Neck: no JVD, carotid bruits, or masses Cardiac: RRR; no murmurs, rubs, or gallops,no edema  Respiratory:  clear to  auscultation bilaterally, normal work of breathing GI: soft, nontender, nondistended, + BS MS: no deformity or atrophy Skin: warm and dry, no rash Neuro:  Strength and sensation are intact Psych: euthymic mood, full affect   EKG:  EKG is ordered today. The ekg ordered today demonstrates normal sinus rhythm with left ventricular hypertrophy.   Recent Labs: 05/18/2016: ALT 14; BUN 20; Potassium 3.7; Sodium 140 05/21/2016: Creatinine, Ser 0.87; Hemoglobin 9.3; Platelets 137    Lipid Panel    Component Value  Date/Time   CHOL 113 07/02/2015 0805   TRIG 102 07/02/2015 0805   HDL 39 (L) 07/02/2015 0805   CHOLHDL 2.9 07/02/2015 0805   VLDL 20 07/02/2015 0805   LDLCALC 54 07/02/2015 0805   LDLDIRECT 77.8 01/28/2010 1058      Wt Readings from Last 3 Encounters:  10/30/16 112 lb 8 oz (51 kg)  05/17/16 110 lb (49.9 kg)  05/01/16 114 lb 8 oz (51.9 kg)        ASSESSMENT AND PLAN:  1.  Coronary artery disease involving native coronary arteries with other forms of angina:   She has no chest pain but chronic exertional dyspnea. Her symptoms have been stable and she continues to be active. Continue medical therapy.  2. Carotid artery disease: Continue medical therapy. I only recommend revascularization if she is symptomatic. I'm not going to repeat carotid Doppler unless she develops symptoms.  3. Essential hypertension: Blood pressure is controlled on current medications.  4. Hyperlipidemia: Currently on small dose rosuvastatin 5 mg daily. Most recent LDL was 54.   Disposition:   FU with me in 6 months  Signed,  Lorine BearsMuhammad Maryalyce Sanjuan, MD  10/30/2016 2:49 PM    [facility]

## 2016-10-30 NOTE — Patient Instructions (Signed)
Medication Instructions: Continue same medications.   Labwork: None.   Procedures/Testing: None.   Follow-Up: 6 months with Dr. Makynleigh Breslin.   Any Additional Special Instructions Will Be Listed Below (If Applicable).     If you need a refill on your cardiac medications before your next appointment, please call your pharmacy.   

## 2016-11-02 NOTE — Telephone Encounter (Signed)
Opened in error

## 2016-11-13 ENCOUNTER — Encounter: Payer: Self-pay | Admitting: Internal Medicine

## 2016-11-13 ENCOUNTER — Ambulatory Visit (INDEPENDENT_AMBULATORY_CARE_PROVIDER_SITE_OTHER): Payer: Medicare Other | Admitting: Internal Medicine

## 2016-11-13 VITALS — BP 120/60 | HR 60 | Temp 97.4°F | Ht 59.0 in | Wt 113.0 lb

## 2016-11-13 DIAGNOSIS — F39 Unspecified mood [affective] disorder: Secondary | ICD-10-CM

## 2016-11-13 DIAGNOSIS — I779 Disorder of arteries and arterioles, unspecified: Secondary | ICD-10-CM

## 2016-11-13 DIAGNOSIS — Z0000 Encounter for general adult medical examination without abnormal findings: Secondary | ICD-10-CM

## 2016-11-13 DIAGNOSIS — Z7189 Other specified counseling: Secondary | ICD-10-CM

## 2016-11-13 DIAGNOSIS — I25119 Atherosclerotic heart disease of native coronary artery with unspecified angina pectoris: Secondary | ICD-10-CM

## 2016-11-13 DIAGNOSIS — I739 Peripheral vascular disease, unspecified: Secondary | ICD-10-CM

## 2016-11-13 DIAGNOSIS — M8000XD Age-related osteoporosis with current pathological fracture, unspecified site, subsequent encounter for fracture with routine healing: Secondary | ICD-10-CM | POA: Diagnosis not present

## 2016-11-13 DIAGNOSIS — E785 Hyperlipidemia, unspecified: Secondary | ICD-10-CM | POA: Diagnosis not present

## 2016-11-13 DIAGNOSIS — S72141D Displaced intertrochanteric fracture of right femur, subsequent encounter for closed fracture with routine healing: Secondary | ICD-10-CM

## 2016-11-13 DIAGNOSIS — Z23 Encounter for immunization: Secondary | ICD-10-CM

## 2016-11-13 LAB — COMPREHENSIVE METABOLIC PANEL
ALT: 12 U/L (ref 0–35)
AST: 15 U/L (ref 0–37)
Albumin: 4 g/dL (ref 3.5–5.2)
Alkaline Phosphatase: 64 U/L (ref 39–117)
BILIRUBIN TOTAL: 0.3 mg/dL (ref 0.2–1.2)
BUN: 19 mg/dL (ref 6–23)
CALCIUM: 9.9 mg/dL (ref 8.4–10.5)
CHLORIDE: 105 meq/L (ref 96–112)
CO2: 31 meq/L (ref 19–32)
CREATININE: 0.9 mg/dL (ref 0.40–1.20)
GFR: 61.87 mL/min (ref >60.00)
GLUCOSE: 87 mg/dL (ref 70–99)
Potassium: 4 mEq/L (ref 3.5–5.1)
SODIUM: 140 meq/L (ref 135–145)
Total Protein: 7.1 g/dL (ref 6.0–8.3)

## 2016-11-13 LAB — LIPID PANEL
CHOL/HDL RATIO: 3
Cholesterol: 142 mg/dL (ref 0–200)
HDL: 46.4 mg/dL (ref >39.00)
LDL CALC: 62 mg/dL (ref 0–99)
NonHDL: 95.4
TRIGLYCERIDES: 167 mg/dL — AB (ref 0.0–149.0)
VLDL: 33.4 mg/dL (ref 0.0–40.0)

## 2016-11-13 LAB — CBC WITH DIFFERENTIAL/PLATELET
BASOS ABS: 0 10*3/uL (ref 0.0–0.1)
BASOS PCT: 0.5 % (ref 0.0–3.0)
EOS ABS: 0.2 10*3/uL (ref 0.0–0.7)
Eosinophils Relative: 2 % (ref 0.0–5.0)
HEMATOCRIT: 37.7 % (ref 36.0–46.0)
Hemoglobin: 12.2 g/dL (ref 12.0–15.0)
LYMPHS PCT: 20.5 % (ref 12.0–46.0)
Lymphs Abs: 1.6 10*3/uL (ref 0.7–4.0)
MCHC: 32.4 g/dL (ref 30.0–36.0)
MCV: 95.2 fl (ref 78.0–100.0)
MONO ABS: 0.7 10*3/uL (ref 0.1–1.0)
Monocytes Relative: 8.2 % (ref 3.0–12.0)
NEUTROS ABS: 5.5 10*3/uL (ref 1.4–7.7)
Neutrophils Relative %: 68.8 % (ref 43.0–77.0)
PLATELETS: 213 10*3/uL (ref 150.0–400.0)
RBC: 3.96 Mil/uL (ref 3.87–5.11)
RDW: 15 % (ref 11.5–15.5)
WBC: 8 10*3/uL (ref 4.0–10.5)

## 2016-11-13 NOTE — Assessment & Plan Note (Signed)
Will check labs

## 2016-11-13 NOTE — Assessment & Plan Note (Signed)
Melancholy and lonely at times Some ongoing grieving Not appropriate for meds

## 2016-11-13 NOTE — Assessment & Plan Note (Signed)
Has DNR 

## 2016-11-13 NOTE — Assessment & Plan Note (Signed)
Still with stable angina pattern No med changes needed

## 2016-11-13 NOTE — Assessment & Plan Note (Signed)
No action unless she has neurologic event

## 2016-11-13 NOTE — Assessment & Plan Note (Signed)
Recommended alendronate--but she is reluctant. Will check DEXA

## 2016-11-13 NOTE — Addendum Note (Signed)
Addended by: Eual FinesBRIDGES, Theresia Pree P on: 11/13/2016 12:34 PM   Modules accepted: Orders

## 2016-11-13 NOTE — Progress Notes (Signed)
Pre visit review using our clinic review tool, if applicable. No additional management support is needed unless otherwise documented below in the visit note. 

## 2016-11-13 NOTE — Assessment & Plan Note (Signed)
I have personally reviewed the Medicare Annual Wellness questionnaire and have noted 1. The patient's medical and social history 2. Their use of alcohol, tobacco or illicit drugs 3. Their current medications and supplements 4. The patient's functional ability including ADL's, fall risks, home safety risks and hearing or visual             impairment. 5. Diet and physical activities 6. Evidence for depression or mood disorders  The patients weight, height, BMI and visual acuity have been recorded in the chart I have made referrals, counseling and provided education to the patient based review of the above and I have provided the pt with a written personalized care plan for preventive services.  I have provided you with a copy of your personalized plan for preventive services. Please take the time to review along with your updated medication list.  Flu vaccine today No cancer screening due to age Tries to stay active and still lives alone in her own home

## 2016-11-13 NOTE — Progress Notes (Signed)
Subjective:    Patient ID: Christina RenshawRuth J Feggins, female    DOB: 03-15-1920, 82 y.o.   MRN: [id number]  HPI Here with daughter Carney BernJean for Medicare wellness and follow up of chronic health conditions Reviewed form and advanced directives Reviewed other doctors No alcohol or tobacco Doesn't drive--but can do her instrumental ADLs. They shop for food together. Vision is fading some--more trouble reading Hearing is not so good. No functional trouble with this Did have fall and broken hip Does feel sad a lot--- still grieving for son and lack of freedom (no driver's license) Some loneliness also. No anhedonia Husband still at his daughter's home--- she visits intermittently (rarely comes to her house---but daughter there with him)  Has mostly healed up from hip fracture Larey SeatFell in garage---son's dog surprised her Only uses cane if she is out doing work in the yard Is on vitamin---calcium and vitamin D Did have DEXA--but many years ago  No new issues with heart Recent cardiology visit okay Still gets occasional chest pain--- NTG will relieve Breathing has been okay No palpitations No edema No neurologic symptoms  Current Outpatient Prescriptions on File Prior to Visit  Medication Sig Dispense Refill  . aspirin EC 81 MG tablet Take 81 mg by mouth daily.    . bisacodyl (DULCOLAX) 10 MG suppository Place 1 suppository (10 mg total) rectally daily as needed for moderate constipation. 12 suppository 0  . carvedilol (COREG) 3.125 MG tablet Take 1 tablet by mouth two  times daily 180 tablet 1  . gabapentin (NEURONTIN) 300 MG capsule Take 1 capsule (300 mg total) by mouth at bedtime. 30 capsule 0  . isosorbide mononitrate (IMDUR) 30 MG 24 hr tablet Take 1 tablet (30 mg total) by mouth daily. 90 tablet 3  . lisinopril (PRINIVIL,ZESTRIL) 10 MG tablet TAKE ONE TABLET BY MOUTH ONCE DAILY 30 tablet 3  . meclizine (ANTIVERT) 25 MG tablet Take 1 tablet (25 mg total) by mouth 3 (three) times daily as needed for  dizziness. 30 tablet 3  . MEGARED OMEGA-3 KRILL OIL 500 MG CAPS Take 1 capsule by mouth daily.     Marland Kitchen. menthol-cetylpyridinium (CEPACOL) 3 MG lozenge Take 1 lozenge (3 mg total) by mouth as needed for sore throat. 100 tablet 12  . Multiple Vitamins-Minerals (MULTIVITAMIN PO) Take 1 tablet by mouth daily.    . nitroGLYCERIN (NITROSTAT) 0.4 MG SL tablet Place 1 tablet (0.4 mg total) under the tongue every 5 (five) minutes as needed for chest pain. 25 tablet 2  . oxyCODONE (OXY IR/ROXICODONE) 5 MG immediate release tablet Take 1-2 tablets (5-10 mg total) by mouth every 4 (four) hours as needed for breakthrough pain ((for MODERATE breakthrough pain)). 30 tablet 0  . rosuvastatin (CRESTOR) 5 MG tablet Take 1 tablet by mouth  daily 90 tablet 1  . senna (SENOKOT) 8.6 MG TABS tablet Take 1 tablet (8.6 mg total) by mouth 2 (two) times daily. 120 each 0   No current facility-administered medications on file prior to visit.     Allergies  Allergen Reactions  . Atorvastatin Other (See Comments)    Reaction:  Blisters on feet   . Diltiazem Hcl Other (See Comments)    Reaction:  Unknown   . Dye Fdc Red [Red Dye] Other (See Comments)    Reaction:  Unknown   . Gemfibrozil Other (See Comments)    Reaction:  Unknown   . Niacin Other (See Comments)    Reaction:  Unknown   . Sulfasalazine Rash  Past Medical History:  Diagnosis Date  . CAD (coronary artery disease)    a. S/P previous MI;  b. 04/2007 Cath/PCI: Taxus DES' to mid/distal RCA and RPDA as well as Ramus;  c. myoview 8/09: EF 78%, normal perfusion  . Carotid stenosis    Bilateral, mild to moderate  . Chronic back pain   . Chronic shoulder pain   . Hyperlipidemia    Intolerant of many statins  . Hypertension   . Mild mitral and aortic regurgitation    a. 11/2011 Echo: EF 55-65%, No RWMA, Gr 1 DD, Mild AI/MR.  Marland Kitchen Neuropathy (HCC)   . Osteoarthritis   . Osteoporosis   . Pelvic fracture (HCC) 6/16  . Peripheral vascular disease [facility])      Past Surgical History:  Procedure Laterality Date  . ADENOSINE MYOVIEW  01/2006   No ischemia, EF 70%  . BREAST BIOPSY     Bilateral  . CAROTID U/S  10/2006   0-39% bilaterally  . CATARACT EXTRACTION  09/2006   Right  . CORONARY ANGIOPLASTY  12/2002   Medical rx only  . CORONARY ANGIOPLASTY  01/2006   LAD/PAD disease  . CORONARY ANGIOPLASTY  03/2007   3 vessel disease  . CORONARY STENT PLACEMENT  2002   MI, [facility]  . CORONARY STENT PLACEMENT  04/2007   RCA/LCX multiple, [facility]  . CP ADMIT  09/2005   Stress myoview negative  . FEMORAL HERNIA REPAIR  05/2007   Incarcerated, right  . FRACTURE SURGERY  03/2009   Left leg  . INTRAMEDULLARY (IM) NAIL INTERTROCHANTERIC Right 05/18/2016   Procedure: INTRAMEDULLARY (IM) NAIL INTERTROCHANTRIC;  Surgeon: [name], MD;  Location: [facility] ORS;  Service: Orthopedics;  Laterality: Right;  . NM MYOVIEW LTD  11/2004   Stress, negative; NL EF  . VESICOVAGINAL FISTULA CLOSURE W/ TAH      Family History  Problem Relation Age of Onset  . Pulmonary embolism Mother   . Heart disease Father   . Heart disease Sister   . Cancer Brother   . Heart disease Sister     Social History   Social History  . Marital status: Widowed    Spouse name: N/A  . Number of children: 3  . Years of education: N/A   Occupational History  . Not on file.   Social History Main Topics  . Smoking status: Never Smoker  . Smokeless tobacco: Never Used  . Alcohol use No  . Drug use: No  . Sexual activity: Not on file   Other Topics Concern  . Not on file   Social History Narrative   Widowed 2005 after long time care for husband after stroke   Remarried but seperated   Has 3 children      No formal living will   Daughter [name] should be her health care POA   Has DNR already   No feeding tube if cognitively unaware   Review of Systems Appetite is okay--but not great Weight is fairly stable Sleep is still not great. Will rest during day if she  is tired Wears seat belt Full upper and lower plates Bowels are fine. No blood in stool Voids okay--- some urge incontinence if she doesn't go regularly. No pad Thinks she has rash on face--- but nothing visible. No suspicious lesions    Objective:   Physical Exam  Constitutional: She is oriented to person, place, and time. She appears well-developed and well-nourished. No distress.  HENT:  Mouth/Throat: Oropharynx is  clear and moist. No oropharyngeal exudate.  Neck: Normal range of motion. Neck supple.  Cardiovascular: Normal rate, regular rhythm, normal heart sounds and intact distal pulses.  Exam reveals no gallop.   No murmur heard. Pulmonary/Chest: Effort normal and breath sounds normal. No respiratory distress. She has no wheezes. She has no rales.  Abdominal: Soft. There is no tenderness.  Musculoskeletal: She exhibits no edema or tenderness.  Lymphadenopathy:    She has no cervical adenopathy.  Neurological: She is alert and oriented to person, place, and time.  President-- "Trump, Obama, ?" 100-93-? D-o----No, I can't Recall 2/3  Skin: No rash noted. No erythema.  Psychiatric: She has a normal mood and affect. Her behavior is normal.          Assessment & Plan:

## 2016-11-18 ENCOUNTER — Other Ambulatory Visit: Payer: Self-pay | Admitting: Internal Medicine

## 2016-11-24 ENCOUNTER — Telehealth: Payer: Self-pay | Admitting: Internal Medicine

## 2016-11-24 NOTE — Telephone Encounter (Signed)
[facility] - Client TELEPHONE ADVICE RECORD [facility] Patient Name: Christina [name] DOB: [birth year] Initial Comment Caller states 82 yr old mother has occasional runny nose, wants to know what she can give otc. Nurse Assessment Nurse: Lane HackerHarley, RN, Elvin SoWindy Date/Time (Eastern Time): 11/24/2016 11:33:08 AM Confirm and document reason for call. If symptomatic, describe symptoms. ---Caller states that mother has had an occasional runny nose, wants to know what she can give OTC allergy med that wouldn't interact with her medications? S/S off/on this week. She was taking them every day and MD advised that she not do this. - Not with her mother right now. Does the patient have any new or worsening symptoms? ---Yes Will a triage be completed? ---No Select reason for no triage. ---Other Please document clinical information provided and list any resource used. ---She is not with her mother. RN looked up allergy med like Zyrtec and her meds per her current list. (per [employer] Drug interaction checker). Caller verbalized understanding. Aware not to take it all the time. Guidelines Guideline Title Affirmed Question Affirmed Notes Final Disposition User Clinical Call SpeedHarley, RN, MedtronicWindy

## 2016-11-24 NOTE — Telephone Encounter (Signed)
Yes---let her know if the runny nose is allergy-- cetirizine or loratadine 10mg  daily would both be fine

## 2016-11-24 NOTE — Telephone Encounter (Signed)
Spoke to Jean. 

## 2016-12-07 ENCOUNTER — Other Ambulatory Visit: Payer: Self-pay | Admitting: Cardiovascular Disease

## 2017-01-08 ENCOUNTER — Ambulatory Visit
Admission: RE | Admit: 2017-01-08 | Discharge: 2017-01-08 | Disposition: A | Discharge status: Home / Self Care | Payer: Medicare Other | Source: Ambulatory Visit | Attending: Internal Medicine | Admitting: Internal Medicine

## 2017-01-08 DIAGNOSIS — S72141D Displaced intertrochanteric fracture of right femur, subsequent encounter for closed fracture with routine healing: Secondary | ICD-10-CM

## 2017-01-08 DIAGNOSIS — M818 Other osteoporosis without current pathological fracture: Secondary | ICD-10-CM | POA: Insufficient documentation

## 2017-01-08 DIAGNOSIS — M810 Age-related osteoporosis without current pathological fracture: Secondary | ICD-10-CM | POA: Diagnosis not present

## 2017-01-08 DIAGNOSIS — M80051D Age-related osteoporosis with current pathological fracture, right femur, subsequent encounter for fracture with routine healing: Secondary | ICD-10-CM | POA: Diagnosis not present

## 2017-01-09 ENCOUNTER — Telehealth: Payer: Self-pay | Admitting: Internal Medicine

## 2017-01-09 NOTE — Telephone Encounter (Signed)
Jean returned your call

## 2017-01-09 NOTE — Telephone Encounter (Signed)
See Bone Density Results

## 2017-01-10 ENCOUNTER — Telehealth: Payer: Self-pay

## 2017-01-10 MED ORDER — IBANDRONATE SODIUM 150 MG PO TABS: 150.0000 mg | ORAL_TABLET | ORAL | 1 refills | Status: DC

## 2017-01-10 NOTE — Telephone Encounter (Signed)
See previous telephone encounter.

## 2017-01-10 NOTE — Addendum Note (Signed)
Addended by: Eual FinesBRIDGES, Crysten Kaman P on: 01/10/2017 02:51 PM   Modules accepted: Orders

## 2017-01-10 NOTE — Telephone Encounter (Signed)
Pt called regarding the boniva tablet, she said one tab [facility] is over $200. She said she'll leave it up to you on how many you wan to order. She's willing to try it. She said you might want to order 1-2 to start with.

## 2017-01-10 NOTE — Telephone Encounter (Signed)
Christina BernJean called back and said the Boniva was $200 a month. The Alendronate would be more. She said her mother was on the alendronate in the past and could not tolerate it. Said to go ahead and send in 1 or 2 and will see if she tolerates it.

## 2017-01-10 NOTE — Telephone Encounter (Signed)
Spoke to pt's daughter, Carney BernJean, about checking to find out which medication insurance will over better Boniva or Fosamax. She will call me back to let me know which one.

## 2017-01-10 NOTE — Telephone Encounter (Addendum)
Carney BernJean called back. She said Alendronate was a Tier 4. Sandrea HammondBoniva is a Tier 3. She will call me back to let me know where to send it.

## 2017-01-10 NOTE — Telephone Encounter (Signed)
Spoke to SheridanJean. We decided to send it to [facility] because it will be cheaper. I selected for it to go normal but the rx keeps printing. Will have Dr Alphonsus SiasLetvak sign it and [facility]

## 2017-01-16 ENCOUNTER — Telehealth: Payer: Self-pay

## 2017-01-16 NOTE — Telephone Encounter (Signed)
Christina [name] pts daughter left v/m; pt received the ibandronate # 2 and pt is concerned about taking the medication; pt is afraid the med will Kill her. Christina [name] wants to know what to tell her mom about the med that will alleviate pts concerns.

## 2017-01-17 NOTE — Telephone Encounter (Signed)
Spoke to daughter, Carney BernJean.

## 2017-01-17 NOTE — Telephone Encounter (Signed)
The only significant danger with the medication is irritation of the esophagus. This can be avoided by taking it on a full stomach with a large glass of liquid (like water) and not lying down for a long time after taking it (at least 30-60 minutes).  In general, it is a very safe medication

## 2017-01-26 ENCOUNTER — Other Ambulatory Visit: Payer: Self-pay | Admitting: Internal Medicine

## 2017-03-20 ENCOUNTER — Other Ambulatory Visit: Payer: Self-pay

## 2017-03-20 NOTE — Telephone Encounter (Signed)
V/M left requesting refill ibandronate to [facility]. Pt has had 2 tabs and had no problems with this med.Please advise. See 01/10/17 phone note.Please advise.

## 2017-03-21 MED ORDER — IBANDRONATE SODIUM 150 MG PO TABS: 150.0000 mg | ORAL_TABLET | ORAL | 3 refills | Status: DC

## 2017-03-21 NOTE — Telephone Encounter (Signed)
Approved: #3 x 3 

## 2017-06-18 ENCOUNTER — Encounter: Payer: Self-pay | Admitting: Emergency Medicine

## 2017-06-18 ENCOUNTER — Encounter
Admission: EM | Disposition: A | Discharge status: Skilled Nursing Facility | Payer: Self-pay | Source: Home / Self Care | Attending: Specialist

## 2017-06-18 ENCOUNTER — Inpatient Hospital Stay: Payer: Medicare Other | Admitting: Anesthesiology

## 2017-06-18 ENCOUNTER — Emergency Department: Payer: Medicare Other

## 2017-06-18 ENCOUNTER — Inpatient Hospital Stay
Admission: EM | Admit: 2017-06-18 | Discharge: 2017-06-20 | DRG: 482 | Disposition: A | Discharge status: Skilled Nursing Facility | Payer: Medicare Other | Attending: Specialist | Admitting: Specialist

## 2017-06-18 ENCOUNTER — Inpatient Hospital Stay: Payer: Medicare Other

## 2017-06-18 DIAGNOSIS — Z66 Do not resuscitate: Secondary | ICD-10-CM | POA: Diagnosis not present

## 2017-06-18 DIAGNOSIS — I25118 Atherosclerotic heart disease of native coronary artery with other forms of angina pectoris: Secondary | ICD-10-CM | POA: Diagnosis not present

## 2017-06-18 DIAGNOSIS — Z888 Allergy status to other drugs, medicaments and biological substances status: Secondary | ICD-10-CM | POA: Diagnosis not present

## 2017-06-18 DIAGNOSIS — E785 Hyperlipidemia, unspecified: Secondary | ICD-10-CM | POA: Diagnosis not present

## 2017-06-18 DIAGNOSIS — S72002A Fracture of unspecified part of neck of left femur, initial encounter for closed fracture: Secondary | ICD-10-CM

## 2017-06-18 DIAGNOSIS — G629 Polyneuropathy, unspecified: Secondary | ICD-10-CM | POA: Diagnosis present

## 2017-06-18 DIAGNOSIS — I739 Peripheral vascular disease, unspecified: Secondary | ICD-10-CM | POA: Diagnosis present

## 2017-06-18 DIAGNOSIS — M6281 Muscle weakness (generalized): Secondary | ICD-10-CM | POA: Diagnosis not present

## 2017-06-18 DIAGNOSIS — W1830XA Fall on same level, unspecified, initial encounter: Secondary | ICD-10-CM | POA: Diagnosis present

## 2017-06-18 DIAGNOSIS — Z9181 History of falling: Secondary | ICD-10-CM | POA: Diagnosis not present

## 2017-06-18 DIAGNOSIS — I080 Rheumatic disorders of both mitral and aortic valves: Secondary | ICD-10-CM | POA: Diagnosis not present

## 2017-06-18 DIAGNOSIS — M80052D Age-related osteoporosis with current pathological fracture, left femur, subsequent encounter for fracture with routine healing: Secondary | ICD-10-CM | POA: Diagnosis not present

## 2017-06-18 DIAGNOSIS — Z7982 Long term (current) use of aspirin: Secondary | ICD-10-CM | POA: Diagnosis not present

## 2017-06-18 DIAGNOSIS — M25552 Pain in left hip: Secondary | ICD-10-CM | POA: Diagnosis not present

## 2017-06-18 DIAGNOSIS — Z955 Presence of coronary angioplasty implant and graft: Secondary | ICD-10-CM

## 2017-06-18 DIAGNOSIS — S72009A Fracture of unspecified part of neck of unspecified femur, initial encounter for closed fracture: Secondary | ICD-10-CM | POA: Diagnosis not present

## 2017-06-18 DIAGNOSIS — S72142A Displaced intertrochanteric fracture of left femur, initial encounter for closed fracture: Principal | ICD-10-CM | POA: Diagnosis present

## 2017-06-18 DIAGNOSIS — S72001A Fracture of unspecified part of neck of right femur, initial encounter for closed fracture: Secondary | ICD-10-CM | POA: Diagnosis not present

## 2017-06-18 DIAGNOSIS — I10 Essential (primary) hypertension: Secondary | ICD-10-CM | POA: Diagnosis present

## 2017-06-18 DIAGNOSIS — I252 Old myocardial infarction: Secondary | ICD-10-CM | POA: Diagnosis not present

## 2017-06-18 DIAGNOSIS — T148XXA Other injury of unspecified body region, initial encounter: Secondary | ICD-10-CM

## 2017-06-18 DIAGNOSIS — R262 Difficulty in walking, not elsewhere classified: Secondary | ICD-10-CM | POA: Diagnosis not present

## 2017-06-18 DIAGNOSIS — I2510 Atherosclerotic heart disease of native coronary artery without angina pectoris: Secondary | ICD-10-CM | POA: Diagnosis not present

## 2017-06-18 DIAGNOSIS — M84459A Pathological fracture, hip, unspecified, initial encounter for fracture: Secondary | ICD-10-CM | POA: Diagnosis not present

## 2017-06-18 DIAGNOSIS — I25119 Atherosclerotic heart disease of native coronary artery with unspecified angina pectoris: Secondary | ICD-10-CM | POA: Diagnosis not present

## 2017-06-18 DIAGNOSIS — Z7401 Bed confinement status: Secondary | ICD-10-CM | POA: Diagnosis not present

## 2017-06-18 DIAGNOSIS — M1990 Unspecified osteoarthritis, unspecified site: Secondary | ICD-10-CM | POA: Diagnosis not present

## 2017-06-18 HISTORY — PX: INTRAMEDULLARY (IM) NAIL INTERTROCHANTERIC: SHX5875

## 2017-06-18 LAB — COMPREHENSIVE METABOLIC PANEL
ALBUMIN: 3.8 g/dL (ref 3.5–5.0)
ALK PHOS: 49 U/L (ref 38–126)
ALT: 13 U/L — AB (ref 14–54)
AST: 17 U/L (ref 15–41)
Anion gap: 6 (ref 5–15)
BUN: 19 mg/dL (ref 6–20)
CALCIUM: 9.8 mg/dL (ref 8.9–10.3)
CHLORIDE: 108 mmol/L (ref 101–111)
CO2: 27 mmol/L (ref 22–32)
CREATININE: 0.94 mg/dL (ref 0.44–1.00)
GFR calc non Af Amer: 50 mL/min — ABNORMAL LOW (ref >60)
GFR, EST AFRICAN AMERICAN: 58 mL/min — AB (ref >60)
GLUCOSE: 98 mg/dL (ref 65–99)
Potassium: 4.6 mmol/L (ref 3.5–5.1)
SODIUM: 141 mmol/L (ref 135–145)
Total Bilirubin: 0.7 mg/dL (ref 0.3–1.2)
Total Protein: 7 g/dL (ref 6.5–8.1)

## 2017-06-18 LAB — CBC
HEMATOCRIT: 36.2 % (ref 35.0–47.0)
HEMOGLOBIN: 12.3 g/dL (ref 12.0–16.0)
MCH: 31.7 pg (ref 26.0–34.0)
MCHC: 33.8 g/dL (ref 32.0–36.0)
MCV: 93.6 fL (ref 80.0–100.0)
Platelets: 232 10*3/uL (ref 150–440)
RBC: 3.87 MIL/uL (ref 3.80–5.20)
RDW: 13.8 % (ref 11.5–14.5)
WBC: 6.7 10*3/uL (ref 3.6–11.0)

## 2017-06-18 LAB — PROTIME-INR
INR: 0.96
Prothrombin Time: 12.8 seconds (ref 11.4–15.2)

## 2017-06-18 LAB — TYPE AND SCREEN
ABO/RH(D): O POS
Antibody Screen: NEGATIVE

## 2017-06-18 SURGERY — FIXATION, FRACTURE, INTERTROCHANTERIC, WITH INTRAMEDULLARY ROD
Anesthesia: General | Laterality: Left

## 2017-06-18 MED ORDER — CLINDAMYCIN PHOSPHATE 600 MG/50ML IV SOLN
600.0000 mg | INTRAVENOUS | Status: AC
  Administered 2017-06-18: 600 mg via INTRAVENOUS
  Filled 2017-06-18: qty 50

## 2017-06-18 MED ORDER — ONDANSETRON HCL 4 MG/2ML IJ SOLN
INTRAMUSCULAR | Status: AC
  Administered 2017-06-19: 4 mg
  Filled 2017-06-18: qty 2

## 2017-06-18 MED ORDER — ACETAMINOPHEN 325 MG PO TABS: 650.0000 mg | ORAL_TABLET | Freq: Four times a day (QID) | ORAL | Status: DC | PRN

## 2017-06-18 MED ORDER — PROPOFOL 10 MG/ML IV BOLUS
INTRAVENOUS | Status: AC
  Filled 2017-06-18: qty 20

## 2017-06-18 MED ORDER — CEFAZOLIN SODIUM-DEXTROSE 2-4 GM/100ML-% IV SOLN
2.0000 g | Freq: Once | INTRAVENOUS | Status: AC
  Administered 2017-06-18: 2 g via INTRAVENOUS
  Filled 2017-06-18: qty 100

## 2017-06-18 MED ORDER — NEOMYCIN-POLYMYXIN B GU 40-200000 IR SOLN
Status: AC
  Filled 2017-06-18: qty 2

## 2017-06-18 MED ORDER — SODIUM CHLORIDE 0.9 % IV SOLN
INTRAVENOUS | Status: DC
  Administered 2017-06-18: 16:00:00 via INTRAVENOUS

## 2017-06-18 MED ORDER — ONDANSETRON HCL 4 MG/2ML IJ SOLN
4.0000 mg | Freq: Once | INTRAMUSCULAR | Status: AC | PRN
  Administered 2017-06-18: 4 mg via INTRAVENOUS

## 2017-06-18 MED ORDER — NEOMYCIN-POLYMYXIN B GU 40-200000 IR SOLN
Status: DC | PRN
  Administered 2017-06-18: 2 mL

## 2017-06-18 MED ORDER — OMEGA-3-ACID ETHYL ESTERS 1 G PO CAPS: 1.0000 | ORAL_CAPSULE | Freq: Every day | ORAL | Status: DC

## 2017-06-18 MED ORDER — SENNA 8.6 MG PO TABS: 1.0000 | ORAL_TABLET | Freq: Two times a day (BID) | ORAL | Status: DC

## 2017-06-18 MED ORDER — NITROGLYCERIN 0.4 MG SL SUBL: 0.4000 mg | SUBLINGUAL_TABLET | SUBLINGUAL | Status: DC | PRN

## 2017-06-18 MED ORDER — BUPIVACAINE-EPINEPHRINE (PF) 0.25% -1:200000 IJ SOLN
INTRAMUSCULAR | Status: DC | PRN
  Administered 2017-06-18: 30 mL via PERINEURAL

## 2017-06-18 MED ORDER — ONDANSETRON HCL 4 MG PO TABS: 4.0000 mg | ORAL_TABLET | Freq: Four times a day (QID) | ORAL | Status: DC | PRN

## 2017-06-18 MED ORDER — SODIUM CHLORIDE 0.9 % IV SOLN
INTRAVENOUS | Status: DC
  Administered 2017-06-18 (×3): via INTRAVENOUS

## 2017-06-18 MED ORDER — BISACODYL 10 MG RE SUPP: 10.0000 mg | Freq: Every day | RECTAL | Status: DC | PRN

## 2017-06-18 MED ORDER — FENTANYL CITRATE (PF) 100 MCG/2ML IJ SOLN
INTRAMUSCULAR | Status: DC | PRN
  Administered 2017-06-18 (×4): 25 ug via INTRAVENOUS

## 2017-06-18 MED ORDER — FENTANYL CITRATE (PF) 100 MCG/2ML IJ SOLN
25.0000 ug | INTRAMUSCULAR | Status: DC | PRN
  Administered 2017-06-18 (×4): 25 ug via INTRAVENOUS

## 2017-06-18 MED ORDER — HYDRALAZINE HCL 20 MG/ML IJ SOLN
10.0000 mg | Freq: Four times a day (QID) | INTRAMUSCULAR | Status: DC | PRN
  Administered 2017-06-18: 10 mg via INTRAVENOUS
  Filled 2017-06-18: qty 1

## 2017-06-18 MED ORDER — POLYETHYLENE GLYCOL 3350 17 G PO PACK: 17.0000 g | PACK | Freq: Every day | ORAL | Status: DC | PRN

## 2017-06-18 MED ORDER — FENTANYL CITRATE (PF) 100 MCG/2ML IJ SOLN
25.0000 ug | Freq: Once | INTRAMUSCULAR | Status: AC
  Administered 2017-06-18: 25 ug via INTRAVENOUS
  Filled 2017-06-18: qty 2

## 2017-06-18 MED ORDER — MORPHINE SULFATE (PF) 2 MG/ML IV SOLN
2.0000 mg | INTRAVENOUS | Status: DC | PRN
  Administered 2017-06-18: 2 mg via INTRAVENOUS
  Filled 2017-06-18: qty 1

## 2017-06-18 MED ORDER — ROSUVASTATIN CALCIUM 5 MG PO TABS
5.0000 mg | ORAL_TABLET | Freq: Every day | ORAL | Status: DC
  Filled 2017-06-18: qty 1

## 2017-06-18 MED ORDER — ISOSORBIDE MONONITRATE ER 30 MG PO TB24: 30.0000 mg | ORAL_TABLET | Freq: Every day | ORAL | Status: DC

## 2017-06-18 MED ORDER — BUPIVACAINE-EPINEPHRINE (PF) 0.25% -1:200000 IJ SOLN
INTRAMUSCULAR | Status: AC
  Filled 2017-06-18: qty 30

## 2017-06-18 MED ORDER — FENTANYL CITRATE (PF) 100 MCG/2ML IJ SOLN
INTRAMUSCULAR | Status: AC
  Filled 2017-06-18: qty 2

## 2017-06-18 MED ORDER — TRANEXAMIC ACID 1000 MG/10ML IV SOLN
INTRAVENOUS | Status: AC
  Filled 2017-06-18: qty 10

## 2017-06-18 MED ORDER — MECLIZINE HCL 25 MG PO TABS
25.0000 mg | ORAL_TABLET | Freq: Three times a day (TID) | ORAL | Status: DC | PRN
Start: 2017-06-18 — End: 2017-06-19
  Filled 2017-06-18: qty 1

## 2017-06-18 MED ORDER — DOCUSATE SODIUM 100 MG PO CAPS: 100.0000 mg | ORAL_CAPSULE | Freq: Two times a day (BID) | ORAL | Status: DC

## 2017-06-18 MED ORDER — OXYCODONE HCL 5 MG PO TABS
5.0000 mg | ORAL_TABLET | ORAL | Status: DC | PRN
  Administered 2017-06-18: 10 mg via ORAL
  Filled 2017-06-18: qty 2

## 2017-06-18 MED ORDER — ACETAMINOPHEN 650 MG RE SUPP
650.0000 mg | Freq: Four times a day (QID) | RECTAL | Status: DC | PRN
Start: 2017-06-18 — End: 2017-06-19

## 2017-06-18 MED ORDER — CARVEDILOL 3.125 MG PO TABS
3.1250 mg | ORAL_TABLET | Freq: Two times a day (BID) | ORAL | Status: DC
  Administered 2017-06-18: 3.125 mg via ORAL
  Filled 2017-06-18: qty 1

## 2017-06-18 MED ORDER — ONDANSETRON HCL 4 MG/2ML IJ SOLN: 4.0000 mg | Freq: Four times a day (QID) | INTRAMUSCULAR | Status: DC | PRN

## 2017-06-18 MED ORDER — TRANEXAMIC ACID 1000 MG/10ML IV SOLN
INTRAVENOUS | Status: AC | PRN
  Administered 2017-06-18: 1000 mg via INTRAVENOUS

## 2017-06-18 MED ORDER — LISINOPRIL 10 MG PO TABS: 10.0000 mg | ORAL_TABLET | Freq: Every day | ORAL | Status: DC

## 2017-06-18 SURGICAL SUPPLY — 43 items
BIT DRILL SPINE 4.0MMX260 (BIT) ×1
BIT DRILL SPINE 4.0X260 (BIT) ×1 IMPLANT
BLADE HELICAL TI 85 STRL (Anchor) ×1 IMPLANT
BLADE HELICAL TI 85MM STRL (Anchor) ×1 IMPLANT
BNDG COHESIVE 4X5 TAN STRL (GAUZE/BANDAGES/DRESSINGS) ×3 IMPLANT
CANISTER SUCT 1200ML W/VALVE (MISCELLANEOUS) ×3 IMPLANT
CHLORAPREP W/TINT 26ML (MISCELLANEOUS) ×6 IMPLANT
DRAPE C-ARMOR (DRAPES) IMPLANT
DRAPE INCISE 23X17 IOBAN STRL (DRAPES) ×2
DRAPE INCISE 23X17 STRL (DRAPES) ×1 IMPLANT
DRAPE INCISE IOBAN 23X17 STRL (DRAPES) ×1 IMPLANT
DRESSING AQUACEL AG SP 3.5X10 (GAUZE/BANDAGES/DRESSINGS) IMPLANT
DRSG AQUACEL AG ADV 3.5X10 (GAUZE/BANDAGES/DRESSINGS) ×2 IMPLANT
DRSG AQUACEL AG ADV 3.5X14 (GAUZE/BANDAGES/DRESSINGS) IMPLANT
DRSG AQUACEL AG SP 3.5X10 (GAUZE/BANDAGES/DRESSINGS) ×3
ELECT REM PT RETURN 9FT ADLT (ELECTROSURGICAL) ×3
ELECTRODE REM PT RTRN 9FT ADLT (ELECTROSURGICAL) ×1 IMPLANT
GAUZE PETRO XEROFOAM 1X8 (MISCELLANEOUS) IMPLANT
GAUZE SPONGE 4X4 12PLY STRL (GAUZE/BANDAGES/DRESSINGS) ×3 IMPLANT
GLOVE INDICATOR 8.0 STRL GRN (GLOVE) ×5 IMPLANT
GLOVE SURG ORTHO 8.5 STRL (GLOVE) ×5 IMPLANT
GOWN STRL REUS W/ TWL LRG LVL3 (GOWN DISPOSABLE) ×1 IMPLANT
GOWN STRL REUS W/TWL LRG LVL3 (GOWN DISPOSABLE) ×3
GOWN STRL REUS W/TWL LRG LVL4 (GOWN DISPOSABLE) ×3 IMPLANT
KIT RM TURNOVER STRD PROC AR (KITS) ×3 IMPLANT
MAT BLUE FLOOR 46X72 FLO (MISCELLANEOUS) ×3 IMPLANT
NAIL TROCH FIXATION 11MM/130 (Orthopedic Implant) ×2 IMPLANT
NDL SPNL 18GX3.5 QUINCKE PK (NEEDLE) ×1 IMPLANT
NDL SPNL 20GX3.5 QUINCKE YW (NEEDLE) IMPLANT
NEEDLE SPNL 18GX3.5 QUINCKE PK (NEEDLE) ×3 IMPLANT
NEEDLE SPNL 20GX3.5 QUINCKE YW (NEEDLE) ×3 IMPLANT
NS IRRIG 500ML POUR BTL (IV SOLUTION) ×3 IMPLANT
PACK HIP COMPR (MISCELLANEOUS) ×3 IMPLANT
REAMER ROD DEEP FLUTE 2.5X950 (INSTRUMENTS) ×3 IMPLANT
SCREW LOCK TI 5.0X36 F/IM NAIL (Screw) ×2 IMPLANT
STAPLER SKIN PROX 35W (STAPLE) ×3 IMPLANT
SUCTION FRAZIER HANDLE 10FR (MISCELLANEOUS) ×2
SUCTION TUBE FRAZIER 10FR DISP (MISCELLANEOUS) ×1 IMPLANT
SUT VIC AB 0 CT1 36 (SUTURE) ×3 IMPLANT
SUT VIC AB 2-0 CT1 27 (SUTURE) ×3
SUT VIC AB 2-0 CT1 TAPERPNT 27 (SUTURE) ×1 IMPLANT
SYR 10ML LL (SYRINGE) ×2 IMPLANT
SYR 30ML LL (SYRINGE) ×3 IMPLANT

## 2017-06-18 NOTE — H&P (Signed)
THE PATIENT WAS SEEN PRIOR TO SURGERY TODAY.  HISTORY, ALLERGIES, HOME MEDICATIONS AND OPERATIVE PROCEDURE WERE REVIEWED. RISKS AND BENEFITS OF SURGERY DISCUSSED WITH PATIENT AGAIN.  NO CHANGES FROM INITIAL HISTORY AND PHYSICAL NOTED.    

## 2017-06-18 NOTE — Consult Note (Signed)
ORTHOPAEDIC CONSULTATION  REQUESTING PHYSICIAN: Enid Baas, MD  Chief Complaint: Left hip pain  HPI: Christina [name] is a 83 y.o. female who complains of  left hip pain after falling in her garden this afternoon.  The patient was brought to the emergency room where exam and x-rays revealed a comminuted left intertrochanteric hip fracture.  She was cleared for surgery by the medical service.  She will had a similar fracture a year ago, which was pinned by Dr. Martha Clan.  Risks and benefits of surgery versus nonoperative treatment were discussed with the patient and have daughter at length.  They wish to proceed with surgery today.  Past Medical History:  Diagnosis Date  . CAD (coronary artery disease)    a. S/P previous MI;  b. 04/2007 Cath/PCI: Taxus DES' to mid/distal RCA and RPDA as well as Ramus;  c. myoview 8/09: EF 78%, normal perfusion  . Carotid stenosis    Bilateral, mild to moderate  . Chronic back pain   . Chronic shoulder pain   . Hyperlipidemia    Intolerant of many statins  . Hypertension   . Mild mitral and aortic regurgitation    a. 11/2011 Echo: EF 55-65%, No RWMA, Gr 1 DD, Mild AI/MR.  Marland Kitchen Neuropathy   . Osteoarthritis   . Osteoporosis   . Pelvic fracture (HCC) 6/16  . Peripheral vascular disease [facility])    Past Surgical History:  Procedure Laterality Date  . ADENOSINE MYOVIEW  01/2006   No ischemia, EF 70%  . BREAST BIOPSY     Bilateral  . CAROTID U/S  10/2006   0-39% bilaterally  . CATARACT EXTRACTION  09/2006   Right  . CORONARY ANGIOPLASTY  12/2002   Medical rx only  . CORONARY ANGIOPLASTY  01/2006   LAD/PAD disease  . CORONARY ANGIOPLASTY  03/2007   3 vessel disease  . CORONARY STENT PLACEMENT  2002   MI, [facility]  . CORONARY STENT PLACEMENT  04/2007   RCA/LCX multiple, [facility]  . CP ADMIT  09/2005   Stress myoview negative  . FEMORAL HERNIA REPAIR  05/2007   Incarcerated, right  . FRACTURE SURGERY  03/2009   Left leg  . INTRAMEDULLARY (IM)  NAIL INTERTROCHANTERIC Right 05/18/2016   Procedure: INTRAMEDULLARY (IM) NAIL INTERTROCHANTRIC;  Surgeon: [name], MD;  Location: [facility] ORS;  Service: Orthopedics;  Laterality: Right;  . NM MYOVIEW LTD  11/2004   Stress, negative; NL EF  . VESICOVAGINAL FISTULA CLOSURE W/ TAH     Social History   Social History  . Marital status: Widowed    Spouse name: N/A  . Number of children: 3  . Years of education: N/A   Social History Main Topics  . Smoking status: Never Smoker  . Smokeless tobacco: Never Used  . Alcohol use No  . Drug use: No  . Sexual activity: Not Asked   Other Topics Concern  . 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 Pulmonary embolism Mother   . Heart disease Father   . Heart disease Sister   . Cancer Brother   . Heart disease Sister  Allergies  Allergen Reactions  . Atorvastatin Other (See Comments)    Reaction:  Blisters on feet   . Diltiazem Hcl Other (See Comments)    Reaction:  Unknown   . Dye Fdc Red [Red Dye] Other (See Comments)    Reaction:  Unknown   . Gemfibrozil Other (See Comments)    Reaction:  Unknown   . Niacin Other (See Comments)    Reaction:  Unknown   . Sulfasalazine Rash   Prior to Admission medications   Medication Sig Start Date End Date Taking? Authorizing Provider  aspirin EC 81 MG tablet Take 81 mg by mouth daily.   Yes [provider]  carvedilol (COREG) 3.125 MG tablet TAKE 1 TABLET BY MOUTH TWO  TIMES DAILY 01/26/17  Yes Karie Schwalbe, MD  ibandronate (BONIVA) 150 MG tablet Take 1 tablet (150 mg total) by mouth every 30 (thirty) days. Take in the morning with a full glass of water, on an empty stomach, and do not take  anything else by mouth or lie down for the next 30 min. 03/21/17  Yes Tillman Abide I, MD  isosorbide mononitrate (IMDUR) 30 MG 24 hr tablet TAKE 1 TABLET BY MOUTH  DAILY 12/07/16  Yes Iran Ouch, MD  lisinopril (PRINIVIL,ZESTRIL) 10 MG tablet TAKE 1 TABLET BY MOUTH  DAILY 01/26/17  Yes Karie Schwalbe, MD  MEGARED OMEGA-3 KRILL OIL 500 MG CAPS Take 1 capsule by mouth daily.    Yes [provider]  Multiple Vitamins-Minerals (MULTIVITAMIN PO) Take 1 tablet by mouth daily.   Yes [provider]  rosuvastatin (CRESTOR) 5 MG tablet TAKE 1 TABLET BY MOUTH  DAILY 11/20/16  Yes Karie Schwalbe, MD  senna (SENOKOT) 8.6 MG TABS tablet Take 1 tablet (8.6 mg total) by mouth 2 (two) times daily. 05/22/16  Yes Gouru, Deanna Artis, MD  acetaminophen (TYLENOL 8 HOUR) 650 MG CR tablet Take 650 mg by mouth every 8 (eight) hours as needed for pain. 1 tablet at bedtime and as needed during the day    [provider]  bisacodyl (DULCOLAX) 10 MG suppository Place 1 suppository (10 mg total) rectally daily as needed for moderate constipation. 05/22/16   Ramonita Lab, MD  gabapentin (NEURONTIN) 300 MG capsule Take 1 capsule (300 mg total) by mouth at bedtime. Patient not taking: Reported on 06/18/2017 05/22/16   Ramonita Lab, MD  meclizine (ANTIVERT) 25 MG tablet Take 1 tablet (25 mg total) by mouth 3 (three) times daily as needed for dizziness. 05/01/16   Iran Ouch, MD  menthol-cetylpyridinium (CEPACOL) 3 MG lozenge Take 1 lozenge (3 mg total) by mouth as needed for sore throat. 05/22/16   Gouru, Deanna Artis, MD  nitroGLYCERIN (NITROSTAT) 0.4 MG SL tablet Place 1 tablet (0.4 mg total) under the tongue every 5 (five) minutes as needed for chest pain. 08/23/16   Iran Ouch, MD  oxyCODONE (OXY IR/ROXICODONE) 5 MG immediate release tablet Take 1-2 tablets (5-10 mg total) by mouth every 4 (four) hours as needed for breakthrough pain ((for MODERATE breakthrough pain)). 05/22/16   Ramonita Lab, MD   Dg Hip  Unilat With Pelvis 2-3 Views Left  Result Date: 06/18/2017 CLINICAL DATA:  Status post fall after working in the garden sepsis afternoon with left hip pain. EXAM: DG HIP (WITH OR WITHOUT PELVIS) 2-3V LEFT COMPARISON:  June 22nd 2016 FINDINGS: There is comminuted displaced fracture of the intertrochanteric left femur. Patient status post prior fixation of the right femur without malalignment.  IMPRESSION: Comminuted displaced fracture of the intertrochanteric left femur. Electronically Signed   By: Sherian ReinWei-Chen  Lin M.D.   On: 06/18/2017 14:59    Positive ROS: All other systems have been reviewed and were otherwise negative with the exception of those mentioned in the HPI and as above.  Physical Exam: General: Alert, no acute distress Cardiovascular: No pedal edema Respiratory: No cyanosis, no use of accessory musculature GI: No organomegaly, abdomen is soft and non-tender Skin: No lesions in the area of chief complaint Neurologic: Sensation intact distally Psychiatric: Patient is competent for consent with normal mood and affect Lymphatic: No axillary or cervical lymphadenopathy  MUSCULOSKELETAL: Patient is alert and oriented.  The left leg is shortened and slightly rotated.  There is pain with range of motion.  Neurovascular status is good.  The other hip has good motion.  No other orthopedic injuries are noted.  Skin is intact.  Assessment: Intertrochanteric fracture left hip with comminution  Plan: Left hip nailing.    Valinda HoarMILLER,Amarya Kuehl E, MD [phone]   06/18/2017 9:05 PM

## 2017-06-18 NOTE — ED Provider Notes (Addendum)
[facility] Emergency Department Provider Note   ____________________________________________    I have reviewed the triage vital signs and the nursing notes.   HISTORY  Chief Complaint Fall and Hip Pain     HPI Christina [name] is a 83 y.o. female who presents today after a fall. Patient reports she was in her guarding preventing her flowers and stood up and felt that her legs were trembling and she fell onto her left hip. She complains of pain in the left lateral buttock and some of the groin as well. Pain is worse when she attempts to move her left leg. She denies back pain. No chest pain or abdominal pain. No neck pain or head injury. She is not on blood thinners.   Past Medical History:  Diagnosis Date  . CAD (coronary artery disease)    a. S/P previous MI;  b. 04/2007 Cath/PCI: Taxus DES' to mid/distal RCA and RPDA as well as Ramus;  c. myoview 8/09: EF 78%, normal perfusion  . Carotid stenosis    Bilateral, mild to moderate  . Chronic back pain   . Chronic shoulder pain   . Hyperlipidemia    Intolerant of many statins  . Hypertension   . Mild mitral and aortic regurgitation    a. 11/2011 Echo: EF 55-65%, No RWMA, Gr 1 DD, Mild AI/MR.  Marland Kitchen. Neuropathy   . Osteoarthritis   . Osteoporosis   . Pelvic fracture (HCC) 6/16  . Peripheral vascular disease [facility](HCC)     Patient Active Problem List   Diagnosis Date Noted  . Mild malnutrition (HCC) 03/06/2016  . Mood disorder (HCC) 09/06/2015  . Back pain without sciatica 07/26/2015  . Osteoarthritis, multiple sites 03/04/2015  . Advanced directives, counseling/discussion 09/04/2014  . Mild cognitive impairment 09/04/2014  . Routine general medical examination at a health care facility 08/26/2013  . Carotid arterial disease (HCC)   . Hyperlipidemia 12/04/2007  . Essential hypertension 06/04/2007  . Coronary artery disease with angina pectoris (HCC) 06/04/2007  . Osteoporosis 06/04/2007    Past  Surgical History:  Procedure Laterality Date  . ADENOSINE MYOVIEW  01/2006   No ischemia, EF 70%  . BREAST BIOPSY     Bilateral  . CAROTID U/S  10/2006   0-39% bilaterally  . CATARACT EXTRACTION  09/2006   Right  . CORONARY ANGIOPLASTY  12/2002   Medical rx only  . CORONARY ANGIOPLASTY  01/2006   LAD/PAD disease  . CORONARY ANGIOPLASTY  03/2007   3 vessel disease  . CORONARY STENT PLACEMENT  2002   MI, [facility]  . CORONARY STENT PLACEMENT  04/2007   RCA/LCX multiple, [facility]  . CP ADMIT  09/2005   Stress myoview negative  . FEMORAL HERNIA REPAIR  05/2007   Incarcerated, right  . FRACTURE SURGERY  03/2009   Left leg  . INTRAMEDULLARY (IM) NAIL INTERTROCHANTERIC Right 05/18/2016   Procedure: INTRAMEDULLARY (IM) NAIL INTERTROCHANTRIC;  Surgeon: Juanell FairlyKevin Krasinski, MD;  Location: [facility] ORS;  Service: Orthopedics;  Laterality: Right;  . NM MYOVIEW LTD  11/2004   Stress, negative; NL EF  . VESICOVAGINAL FISTULA CLOSURE W/ TAH      Prior to Admission medications   Medication Sig Start Date End Date Taking? Authorizing Provider  acetaminophen (TYLENOL 8 HOUR) 650 MG CR tablet Take 650 mg by mouth every 8 (eight) hours as needed for pain. 1 tablet at bedtime and as needed during the day    [provider]  aspirin EC  81 MG tablet Take 81 mg by mouth daily.    [provider]  bisacodyl (DULCOLAX) 10 MG suppository Place 1 suppository (10 mg total) rectally daily as needed for moderate constipation. 05/22/16   Gouru, Deanna Artis, MD  carvedilol (COREG) 3.125 MG tablet TAKE 1 TABLET BY MOUTH TWO  TIMES DAILY 01/26/17   Karie Schwalbe, MD  gabapentin (NEURONTIN) 300 MG capsule Take 1 capsule (300 mg total) by mouth at bedtime. 05/22/16   Gouru, Deanna Artis, MD  ibandronate (BONIVA) 150 MG tablet Take 1 tablet (150 mg total) by mouth every 30 (thirty) days. Take in the morning with a full glass of water, on an empty stomach, and do not take anything else by mouth or lie down for the next 30 min.  03/21/17   Karie Schwalbe, MD  isosorbide mononitrate (IMDUR) 30 MG 24 hr tablet TAKE 1 TABLET BY MOUTH  DAILY 12/07/16   Iran Ouch, MD  lisinopril (PRINIVIL,ZESTRIL) 10 MG tablet TAKE 1 TABLET BY MOUTH  DAILY 01/26/17   Karie Schwalbe, MD  meclizine (ANTIVERT) 25 MG tablet Take 1 tablet (25 mg total) by mouth 3 (three) times daily as needed for dizziness. 05/01/16   Iran Ouch, MD  MEGARED OMEGA-3 KRILL OIL 500 MG CAPS Take 1 capsule by mouth daily.     [provider]  menthol-cetylpyridinium (CEPACOL) 3 MG lozenge Take 1 lozenge (3 mg total) by mouth as needed for sore throat. 05/22/16   Ramonita Lab, MD  Multiple Vitamins-Minerals (MULTIVITAMIN PO) Take 1 tablet by mouth daily.    [provider]  nitroGLYCERIN (NITROSTAT) 0.4 MG SL tablet Place 1 tablet (0.4 mg total) under the tongue every 5 (five) minutes as needed for chest pain. 08/23/16   Iran Ouch, MD  oxyCODONE (OXY IR/ROXICODONE) 5 MG immediate release tablet Take 1-2 tablets (5-10 mg total) by mouth every 4 (four) hours as needed for breakthrough pain ((for MODERATE breakthrough pain)). 05/22/16   Gouru, Deanna Artis, MD  rosuvastatin (CRESTOR) 5 MG tablet TAKE 1 TABLET BY MOUTH  DAILY 11/20/16   Karie Schwalbe, MD  senna (SENOKOT) 8.6 MG TABS tablet Take 1 tablet (8.6 mg total) by mouth 2 (two) times daily. 05/22/16   Ramonita Lab, MD     Allergies Atorvastatin; Diltiazem hcl; Dye fdc red [red dye]; Gemfibrozil; Niacin; and Sulfasalazine  Family History  Problem Relation Age of Onset  . Pulmonary embolism Mother   . Heart disease Father   . Heart disease Sister   . Cancer Brother   . Heart disease Sister     Social History Social History  Substance Use Topics  . Smoking status: Never Smoker  . Smokeless tobacco: Never Used  . Alcohol use No    Review of Systems  Constitutional: No fever/chills Eyes: No visual changes.  ENT: No sore throat. Cardiovascular: Denies chest  pain. Respiratory: Denies shortness of breath. Gastrointestinal: No abdominal pain.  No nausea, no vomiting.   Genitourinary: Negative for dysuria. Musculoskeletal:As above Skin: Negative for rash. Neurological: Negative for headaches or weakness   ____________________________________________   PHYSICAL EXAM:  VITAL SIGNS: ED Triage Vitals  Enc Vitals Group     BP 06/18/17 1350 (!) 150/65     Pulse Rate 06/18/17 1350 70     Resp 06/18/17 1350 15     Temp 06/18/17 1350 98.6 F (37 C)     Temp Source 06/18/17 1350 Oral     SpO2 06/18/17 1350 94 %  Weight 06/18/17 1351 51.3 kg (113 lb)     Height 06/18/17 1351 1.524 m (5')     Head Circumference --      Peak Flow --      Pain Score 06/18/17 1347 5     Pain Loc --      Pain Edu? --      Excl. in GC? --     Constitutional: Alert and oriented. No acute distress. Pleasant and interactive Eyes: Conjunctivae are normal.  Head: Atraumatic. Nose: No congestion/rhinnorhea. Mouth/Throat: Mucous membranes are moist.   Neck:  Painless ROM, No vertebral tenderness to palpation Cardiovascular: Normal rate, regular rhythm. Grossly normal heart sounds.  Good peripheral circulation. Respiratory: Normal respiratory effort.  No retractions. Lungs CTAB. Gastrointestinal: Soft and nontender. No distention.  No CVA tenderness. Genitourinary: deferred Musculoskeletal: Left leg is possibly slightly shortened, unable to move the leg without significant pain. Pain with axial load. 2+ distal pulses bilaterally. No other extremity injuries noted. No lumbar or vertebral tenderness to palpation. Neurologic:  Normal speech and language. No gross focal neurologic deficits are appreciated.  Skin:  Skin is warm, dry and intact. No rash noted. Psychiatric: Mood and affect are normal. Speech and behavior are normal.  ____________________________________________   LABS (all labs ordered are listed, but only abnormal results are displayed)  Labs  Reviewed  COMPREHENSIVE METABOLIC PANEL - Abnormal; Notable for the following:       Result Value   ALT 13 (*)    GFR calc non Af Amer 50 (*)    GFR calc Af Amer 58 (*)    All other components within normal limits  CBC  PROTIME-INR  URINALYSIS, COMPLETE (UACMP) WITH MICROSCOPIC  CBG MONITORING, ED  TYPE AND SCREEN   ____________________________________________  EKG  ED ECG REPORT I, Jene Every, the attending physician, personally viewed and interpreted this ECG.  Date: 06/18/2017 EKG Time: 1:51 PM Rate: 72 Rhythm: normal sinus rhythm QRS Axis: normal Intervals: normal ST/T Wave abnormalities: normal Narrative Interpretation: unremarkable  ____________________________________________  RADIOLOGY  Hip and pelvis x-rays pending X-ray demonstrates a left intertrochanteric fracture ____________________________________________   PROCEDURES  Procedure(s) performed: No    Critical Care performed: No ____________________________________________   INITIAL IMPRESSION / ASSESSMENT AND PLAN / ED COURSE  Pertinent labs & imaging results that were available during my care of the patient were reviewed by me and considered in my medical decision making (see chart for details).  Patient status post fall, suspicious for left hip fracture. X-rays pending.  of fentanyl given.  X-rays consistent with left intertrochanteric fracture. Will discuss with Dr. Hyacinth Meeker and admit to the hospitalist service.  Dr. Hyacinth Meeker would like to do surgery this afternoon if medically cleared for surgery. RN has been attempting to contact daughter but unable to reach her thus far.     ____________________________________________   FINAL CLINICAL IMPRESSION(S) / ED DIAGNOSES  Final diagnoses:  Closed fracture of left hip, initial encounter (HCC)      NEW MEDICATIONS STARTED DURING THIS VISIT:  New Prescriptions   No medications on file     Note:  This document was prepared  using Dragon voice recognition software and may include unintentional dictation errors.    Jene Every, MD 06/18/17 1512    Jene Every, MD 06/18/17 [phone]

## 2017-06-18 NOTE — Anesthesia Post-op Follow-up Note (Cosign Needed)
Anesthesia QCDR form completed.        

## 2017-06-18 NOTE — H&P (Signed)
[facility] [place] [facility]   PATIENT NAME: Christina [name]    MR#:  [id number]  DATE OF BIRTH:  [birth year]  DATE OF ADMISSION:  06/18/2017  PRIMARY CARE PHYSICIAN: Karie SchwalbeLetvak, Richard I, MD   REQUESTING/REFERRING PHYSICIAN: Dr. Jene Everyobert Kinner  CHIEF COMPLAINT:   Chief Complaint  Patient presents with  . Fall  . Hip Pain    HISTORY OF PRESENT ILLNESS:  Christina [name]  is a 83 y.o. female with a known history of CAD status post stents in 2008, chronic back pain, hypertension, hyperlipidemia and osteoporosis presents to hospital after a fall and left hip pain. Patient has been in her normal state of health this morning, did not eat her breakfast but went outside to work in her garden. When she got up after watering her plants she felt unsteady in her legs with some cramping pain and fell towards her left side. She denies any chest pain or syncopal episode. No dizziness or lightheadedness. She had a fall and right hip fracture pelvis fixed last year. She has been seeing her cardiologist every 6 months and has been stable. Last stress test in 2014 was negative. She has occasional stable angina for which she takes a nitroglycerin tablet which relieves the pain. No further workup was planned. Chronic exertional dyspnea present. No recent chest pains and has been stable otherwise. No evidence of any fluid overload. Denies any recent illness, nausea or vomiting, no fevers or chills.  PAST MEDICAL HISTORY:   Past Medical History:  Diagnosis Date  . CAD (coronary artery disease)    a. S/P previous MI;  b. 04/2007 Cath/PCI: Taxus DES' to mid/distal RCA and RPDA as well as Ramus;  c. myoview 8/09: EF 78%, normal perfusion  . Carotid stenosis    Bilateral, mild to moderate  . Chronic back pain   . Chronic shoulder pain   . Hyperlipidemia    Intolerant of many statins  . Hypertension   . Mild mitral and aortic regurgitation    a. 11/2011 Echo: EF 55-65%, No RWMA, Gr 1 DD, Mild  AI/MR.  Marland Kitchen. Neuropathy   . Osteoarthritis   . Osteoporosis   . Pelvic fracture (HCC) 6/16  . Peripheral vascular disease (HCC)     PAST SURGICAL HISTORY:   Past Surgical History:  Procedure Laterality Date  . ADENOSINE MYOVIEW  01/2006   No ischemia, EF 70%  . BREAST BIOPSY     Bilateral  . CAROTID U/S  10/2006   0-39% bilaterally  . CATARACT EXTRACTION  09/2006   Right  . CORONARY ANGIOPLASTY  12/2002   Medical rx only  . CORONARY ANGIOPLASTY  01/2006   LAD/PAD disease  . CORONARY ANGIOPLASTY  03/2007   3 vessel disease  . CORONARY STENT PLACEMENT  2002   MI, [facility]  . CORONARY STENT PLACEMENT  04/2007   RCA/LCX multiple, [facility]  . CP ADMIT  09/2005   Stress myoview negative  . FEMORAL HERNIA REPAIR  05/2007   Incarcerated, right  . FRACTURE SURGERY  03/2009   Left leg  . INTRAMEDULLARY (IM) NAIL INTERTROCHANTERIC Right 05/18/2016   Procedure: INTRAMEDULLARY (IM) NAIL INTERTROCHANTRIC;  Surgeon: Juanell FairlyKevin Krasinski, MD;  Location: [facility] ORS;  Service: Orthopedics;  Laterality: Right;  . NM MYOVIEW LTD  11/2004   Stress, negative; NL EF  . VESICOVAGINAL FISTULA CLOSURE W/ TAH      SOCIAL HISTORY:   Social History  Substance Use Topics  . Smoking status: Never Smoker  .  Smokeless tobacco: Never Used  . Alcohol use No    FAMILY HISTORY:   Family History  Problem Relation Age of Onset  . Pulmonary embolism Mother   . Heart disease Father   . Heart disease Sister   . Cancer Brother   . Heart disease Sister     DRUG ALLERGIES:   Allergies  Allergen Reactions  . Atorvastatin Other (See Comments)    Reaction:  Blisters on feet   . Diltiazem Hcl Other (See Comments)    Reaction:  Unknown   . Dye Fdc Red [Red Dye] Other (See Comments)    Reaction:  Unknown   . Gemfibrozil Other (See Comments)    Reaction:  Unknown   . Niacin Other (See Comments)    Reaction:  Unknown   . Sulfasalazine Rash    REVIEW OF SYSTEMS:   Review of Systems  Constitutional:  Negative for chills, fever, malaise/fatigue and weight loss.  HENT: Negative for ear discharge, ear pain, hearing loss and nosebleeds.   Eyes: Negative for blurred vision, double vision and photophobia.  Respiratory: Negative for cough, hemoptysis, shortness of breath and wheezing.   Cardiovascular: Negative for chest pain, palpitations, orthopnea and leg swelling.  Gastrointestinal: Negative for abdominal pain, constipation, diarrhea, heartburn, melena, nausea and vomiting.  Genitourinary: Negative for dysuria, frequency and urgency.  Musculoskeletal: Positive for joint pain. Negative for back pain, myalgias and neck pain.  Skin: Negative for rash.  Neurological: Negative for dizziness, tingling, tremors, sensory change, speech change, focal weakness and headaches.  Endo/Heme/Allergies: Does not bruise/bleed easily.  Psychiatric/Behavioral: Negative for depression.    MEDICATIONS AT HOME:   Prior to Admission medications   Medication Sig Start Date End Date Taking? Authorizing Provider  aspirin EC 81 MG tablet Take 81 mg by mouth daily.   Yes [provider]  carvedilol (COREG) 3.125 MG tablet TAKE 1 TABLET BY MOUTH TWO  TIMES DAILY 01/26/17  Yes Karie Schwalbe, MD  ibandronate (BONIVA) 150 MG tablet Take 1 tablet (150 mg total) by mouth every 30 (thirty) days. Take in the morning with a full glass of water, on an empty stomach, and do not take anything else by mouth or lie down for the next 30 min. 03/21/17  Yes Tillman Abide I, MD  isosorbide mononitrate (IMDUR) 30 MG 24 hr tablet TAKE 1 TABLET BY MOUTH  DAILY 12/07/16  Yes Iran Ouch, MD  lisinopril (PRINIVIL,ZESTRIL) 10 MG tablet TAKE 1 TABLET BY MOUTH  DAILY 01/26/17  Yes Karie Schwalbe, MD  MEGARED OMEGA-3 KRILL OIL 500 MG CAPS Take 1 capsule by mouth daily.    Yes [provider]  Multiple Vitamins-Minerals (MULTIVITAMIN PO) Take 1 tablet by mouth daily.   Yes [provider]  rosuvastatin  (CRESTOR) 5 MG tablet TAKE 1 TABLET BY MOUTH  DAILY 11/20/16  Yes Karie Schwalbe, MD  senna (SENOKOT) 8.6 MG TABS tablet Take 1 tablet (8.6 mg total) by mouth 2 (two) times daily. 05/22/16  Yes Gouru, Deanna Artis, MD  acetaminophen (TYLENOL 8 HOUR) 650 MG CR tablet Take 650 mg by mouth every 8 (eight) hours as needed for pain. 1 tablet at bedtime and as needed during the day    [provider]  bisacodyl (DULCOLAX) 10 MG suppository Place 1 suppository (10 mg total) rectally daily as needed for moderate constipation. 05/22/16   Ramonita Lab, MD  gabapentin (NEURONTIN) 300 MG capsule Take 1 capsule (300 mg total) by mouth at bedtime. Patient not  taking: Reported on 06/18/2017 05/22/16   Ramonita Lab, MD  meclizine (ANTIVERT) 25 MG tablet Take 1 tablet (25 mg total) by mouth 3 (three) times daily as needed for dizziness. 05/01/16   Iran Ouch, MD  menthol-cetylpyridinium (CEPACOL) 3 MG lozenge Take 1 lozenge (3 mg total) by mouth as needed for sore throat. 05/22/16   Gouru, Deanna Artis, MD  nitroGLYCERIN (NITROSTAT) 0.4 MG SL tablet Place 1 tablet (0.4 mg total) under the tongue every 5 (five) minutes as needed for chest pain. 08/23/16   Iran Ouch, MD  oxyCODONE (OXY IR/ROXICODONE) 5 MG immediate release tablet Take 1-2 tablets (5-10 mg total) by mouth every 4 (four) hours as needed for breakthrough pain ((for MODERATE breakthrough pain)). 05/22/16   Gouru, Deanna Artis, MD      VITAL SIGNS:  Blood pressure (!) 176/82, pulse (!) 55, temperature 98.6 F (37 C), temperature source Oral, resp. rate 17, height 5' (1.524 m), weight 51.3 kg (113 lb), SpO2 96 %.  PHYSICAL EXAMINATION:   Physical Exam  GENERAL:  83 y.o.-year-old Very pleasant elderly patient lying in the bed with no acute distress.  EYES: Pupils equal, round, reactive to light and accommodation. No scleral icterus. Extraocular muscles intact.  HEENT: Head atraumatic, normocephalic. Oropharynx and nasopharynx clear.  NECK:  Supple, no jugular  venous distention. No thyroid enlargement, no tenderness.  LUNGS: Normal breath sounds bilaterally, no wheezing, rales,rhonchi or crepitation. No use of accessory muscles of respiration.  CARDIOVASCULAR: S1, S2 normal. No rubs, or gallops. 2/6 systolic murmur present ABDOMEN: Soft, nontender, nondistended. Bowel sounds present. No organomegaly or mass.  EXTREMITIES: No pedal edema, cyanosis, or clubbing. Left leg is abducted and externally rotated NEUROLOGIC: Cranial nerves II through XII are intact. Muscle strength 5/5 in all extremities except the left leg due to fracture and pain.. Sensation intact. Gait not checked.  PSYCHIATRIC: The patient is alert and oriented x 3.  SKIN: No obvious rash, lesion, or ulcer.   LABORATORY PANEL:   CBC  Recent Labs Lab 06/18/17 1350  WBC 6.7  HGB 12.3  HCT 36.2  PLT 232   ------------------------------------------------------------------------------------------------------------------  Chemistries   Recent Labs Lab 06/18/17 1401  NA 141  K 4.6  CL 108  CO2 27  GLUCOSE 98  BUN 19  CREATININE 0.94  CALCIUM 9.8  AST 17  ALT 13*  ALKPHOS 49  BILITOT 0.7   ------------------------------------------------------------------------------------------------------------------  Cardiac Enzymes No results for input(s): TROPONINI in the last 168 hours. ------------------------------------------------------------------------------------------------------------------  RADIOLOGY:  Dg Hip Unilat With Pelvis 2-3 Views Left  Result Date: 06/18/2017 CLINICAL DATA:  Status post fall after working in the garden sepsis afternoon with left hip pain. EXAM: DG HIP (WITH OR WITHOUT PELVIS) 2-3V LEFT COMPARISON:  June 22nd 2016 FINDINGS: There is comminuted displaced fracture of the intertrochanteric left femur. Patient status post prior fixation of the right femur without malalignment. IMPRESSION: Comminuted displaced fracture of the intertrochanteric left  femur. Electronically Signed   By: Sherian Rein M.D.   On: 06/18/2017 14:59    EKG:   Orders placed or performed during the hospital encounter of 06/18/17  . ED EKG  . ED EKG  . EKG 12-Lead  . EKG 12-Lead    IMPRESSION AND PLAN:   Christina [name]  is a 83 y.o. female with a known history of CAD status post stents in 2008, chronic back pain, hypertension, hyperlipidemia and osteoporosis presents to hospital after a fall and left hip pain.  #1 left hip fracture-x-ray with  comminuted displaced left intertrochanteric fracture -stable cardiac history. -Mild to moderate risk for noncardiac surgery. No recent cardiac symptoms, no new changes. -Orthopedics consulted. Keep her nothing by mouth for possible surgery later today. -Hold aspirin. -Pain medications, Foley catheter. Physical therapy and DVT prophylaxis will be started after surgery.  #2 CAD-stable. Follows with cardiologist every 6 months and has an upcoming appointment this month. -Hold aspirin for now. Continue Coreg, Imdur and lisinopril. Also on statin.  #3 hypertension-on Imdur, lisinopril and Coreg. Extensive-blood pressure is elevated due to pain and anxiety at this time. Continues IV hydralazine as needed.  #4 DVT prophylaxis- will be started after surgery    All the records are reviewed and case discussed with ED provider. Management plans discussed with the patient, family and they are in agreement.  CODE STATUS: DO NOT RESUSCITATE  TOTAL TIME TAKING CARE OF THIS PATIENT: 50 minutes.    Sharnelle Cappelli M.D on 06/18/2017 at 4:01 PM  Between 7am to 6pm - Pager - [phone]  After 6pm go to [email or web address] - Social research officer, government  [facility]  Office  [phone]  CC: Primary care physician; Karie Schwalbe, MD

## 2017-06-18 NOTE — Anesthesia Procedure Notes (Signed)
Procedure Name: LMA Insertion Date/Time: 06/18/2017 9:46 PM Performed by: Waldo LaineJUSTIS, Mercy Malena Pre-anesthesia Checklist: Patient identified, Patient being monitored, Timeout performed, Emergency Drugs available and Suction available Patient Re-evaluated:Patient Re-evaluated prior to inductionOxygen Delivery Method: Circle system utilized Preoxygenation: Pre-oxygenation with 100% oxygen Intubation Type: IV induction Ventilation: Mask ventilation without difficulty LMA: LMA inserted Tube type: Oral Number of attempts: 1 Placement Confirmation: positive ETCO2 and breath sounds checked- equal and bilateral Tube secured with: Tape Dental Injury: Teeth and Oropharynx as per pre-operative assessment

## 2017-06-18 NOTE — Transfer of Care (Signed)
Immediate Anesthesia Transfer of Care Note  Patient: Christina RenshawRuth J Eslinger  Procedure(s) Performed: Procedure(s): INTRAMEDULLARY (IM) NAIL INTERTROCHANTRIC (Left)  Patient Location: PACU  Anesthesia Type:General  Level of Consciousness: awake and patient cooperative  Airway & Oxygen Therapy: Patient Spontanous Breathing and Patient connected to face mask oxygen  Post-op Assessment: Report given to RN and Post -op Vital signs reviewed and stable  Post vital signs: Reviewed and stable  Last Vitals:  Vitals:   06/18/17 1730 06/18/17 1803  BP: (!) 180/72 (!) 200/60  Pulse: (!) 31 63  Resp: 18 18  Temp:      Last Pain:  Vitals:   06/18/17 1915  TempSrc:   PainSc: 4       Patients Stated Pain Goal: 2 (06/18/17 1816)  Complications: No apparent anesthesia complications

## 2017-06-18 NOTE — ED Notes (Signed)
This rn and karen rn attempted to get foley. Due to anatomy unable to get foley. Did place external female catheter.

## 2017-06-18 NOTE — ED Notes (Signed)
Patient gave this RN permission to contact daughter Carney BernJean regarding her care.

## 2017-06-18 NOTE — Op Note (Signed)
DATE OF SURGERY:  06/18/2017  TIME: 11:03 PM  PATIENT NAME:  Christina [name]  AGE: 83 y.o.  PRE-OPERATIVE DIAGNOSIS:  LEFT HIP FRACTURE INTERTROCHANTERIC  POST-OPERATIVE DIAGNOSIS:  SAME  PROCEDURE:  INTRAMEDULLARY (IM) NAIL INTERTROCHANTRIC LEFT HIP FRACTURE  SURGEON:  Eames Dibiasio E  EBL:  200 cc  COMPLICATIONS:  NONE  OPERATIVE IMPLANTS: Synthes trochanteric femoral nail  130*/11MM  with interlocking helical blade  85 mm and distal locking screw  36 mm.  PREOPERATIVE INDICATIONS:  Christina [name] is a 83 y.o. year old who fell and suffered a hip fracture. She was brought into the ER and then admitted and optimized and then elected for surgical intervention.    The risks benefits and alternatives were discussed with the patient including but not limited to the risks of nonoperative treatment, versus surgical intervention including infection, bleeding, nerve injury, malunion, nonunion, hardware prominence, hardware failure, need for hardware removal, blood clots, cardiopulmonary complications, morbidity, mortality, among others, and they were willing to proceed.    OPERATIVE PROCEDURE:  The patient was brought to the operating room and placed in the supine position.  GENERAL LMA anesthesia was administered, with a foley. She was placed on the fracture table.  Closed reduction was performed under C-arm guidance. The length of the femur was also measured using fluoroscopy. Time out was then performed after sterile prep and drape. She received preoperative antibiotics.  Incision was made proximal to the greater trochanter. A guidewire was placed in the appropriate position. Confirmation was made on AP and lateral views. The above-named nail was opened. I opened the proximal femur with a reamer. I then placed the nail by hand easily down. I did not need to ream the femur.  Once the nail was completely seated, I placed a guidepin into the femoral head into the center center position through a  second incision.  I measured the length, and then reamed the lateral cortex and up into the head. I then placed the helical blade. Slight compression was applied. Anatomic fixation achieved. Bone quality was mediocre.  I then secured the proximal interlock.  The distal locking screw was then placed and after confirming the position of the fracture fragments and hardware I then removed the instruments, and took final C-arm pictures AP and lateral the entire length of the leg. Anatomic reconstruction was achieved, and the wounds were irrigated copiously and closed with Vicryl  followed by staples and dry sterile dressing. Sponge and needle count were correct.   The patient was awakened and returned to PACU in stable and satisfactory condition. There no complications and the patient tolerated the procedure well.  She will be partial weightbearing as tolerated, and will be on Lovenox  For DVT prophylaxis.     Valinda HoarHoward E Lennis Rader, M.D.

## 2017-06-18 NOTE — ED Triage Notes (Addendum)
Patient comes in via [facility] from home with a fall and left hip pain. Per EMS patient was out in the garden weeding out weeds, her legs got weak and she fell. Patient complains of left hip pain with movement. Patient has a hx of right hip fracture, pelvis fracture, 7 cardiac stents. Per EMS patient is under increased stress, her husband of 12 years left her and they are moving out his stuff today. Patient a/o x4. Denies LOC. Patient states she was outside less than an hour, and laid on the ground about 10 minutes per patient.

## 2017-06-18 NOTE — Anesthesia Preprocedure Evaluation (Signed)
Anesthesia Evaluation  Patient identified by MRN, date of birth, ID band Patient awake    Reviewed: Allergy & Precautions, NPO status , Patient's Chart, lab work & pertinent test results  History of Anesthesia Complications Negative for: history of anesthetic complications  Airway Mallampati: II       Dental   Pulmonary neg pulmonary ROS,           Cardiovascular hypertension, Pt. on medications + CAD, + Past MI and + Cardiac Stents       Neuro/Psych CVA (optic nerve)    GI/Hepatic negative GI ROS, Neg liver ROS,   Endo/Other  negative endocrine ROS  Renal/GU negative Renal ROS     Musculoskeletal  (+) Arthritis ,   Abdominal   Peds  Hematology negative hematology ROS (+)   Anesthesia Other Findings   Reproductive/Obstetrics                             Anesthesia Physical Anesthesia Plan  ASA: III  Anesthesia Plan: General   Post-op Pain Management:    Induction:   PONV Risk Score and Plan:   Airway Management Planned: LMA  Additional Equipment:   Intra-op Plan:   Post-operative Plan:   Informed Consent: I have reviewed the patients History and Physical, chart, labs and discussed the procedure including the risks, benefits and alternatives for the proposed anesthesia with the patient or authorized representative who has indicated his/her understanding and acceptance.     Plan Discussed with:   Anesthesia Plan Comments:         Anesthesia Quick Evaluation

## 2017-06-18 NOTE — Progress Notes (Signed)
Pt is alert and oriented, able to verbalize needs. Daughter at bedside. Wedding rings could not be removed from finger. IVF started. CHG bath given.   Suzan SlickAlison L Parveen Freehling

## 2017-06-18 NOTE — ED Notes (Addendum)
Spoke with patients husbands daughter ([phone]), per patients request. She is going to try to get a hold of daughter. I also left a voice mail on daughters phone to call me. I have called daughter 4 times.

## 2017-06-19 ENCOUNTER — Encounter: Payer: Self-pay | Admitting: Specialist

## 2017-06-19 ENCOUNTER — Encounter
Admission: RE | Admit: 2017-06-19 | Discharge: 2017-06-19 | Disposition: A | Discharge status: Home / Self Care | Payer: Medicare Other | Source: Ambulatory Visit | Attending: Internal Medicine | Admitting: Internal Medicine

## 2017-06-19 LAB — CBC
HCT: 30.9 % — ABNORMAL LOW (ref 35.0–47.0)
HEMOGLOBIN: 10.2 g/dL — AB (ref 12.0–16.0)
MCH: 31.1 pg (ref 26.0–34.0)
MCHC: 33 g/dL (ref 32.0–36.0)
MCV: 94.4 fL (ref 80.0–100.0)
Platelets: 190 10*3/uL (ref 150–440)
RBC: 3.27 MIL/uL — AB (ref 3.80–5.20)
RDW: 13.6 % (ref 11.5–14.5)
WBC: 13 10*3/uL — ABNORMAL HIGH (ref 3.6–11.0)

## 2017-06-19 LAB — BASIC METABOLIC PANEL
Anion gap: 5 (ref 5–15)
BUN: 16 mg/dL (ref 6–20)
CALCIUM: 8.2 mg/dL — AB (ref 8.9–10.3)
CHLORIDE: 108 mmol/L (ref 101–111)
CO2: 25 mmol/L (ref 22–32)
CREATININE: 0.88 mg/dL (ref 0.44–1.00)
GFR, EST NON AFRICAN AMERICAN: 54 mL/min — AB (ref >60)
Glucose, Bld: 160 mg/dL — ABNORMAL HIGH (ref 65–99)
Potassium: 3.8 mmol/L (ref 3.5–5.1)
SODIUM: 138 mmol/L (ref 135–145)

## 2017-06-19 MED ORDER — NITROGLYCERIN 0.4 MG SL SUBL: 0.4000 mg | SUBLINGUAL_TABLET | SUBLINGUAL | Status: DC | PRN

## 2017-06-19 MED ORDER — CARVEDILOL 3.125 MG PO TABS
3.1250 mg | ORAL_TABLET | Freq: Two times a day (BID) | ORAL | Status: DC
  Administered 2017-06-19 – 2017-06-20 (×2): 3.125 mg via ORAL
  Filled 2017-06-19 (×2): qty 1

## 2017-06-19 MED ORDER — ASPIRIN EC 81 MG PO TBEC
81.0000 mg | DELAYED_RELEASE_TABLET | Freq: Every day | ORAL | Status: DC
  Administered 2017-06-20: 81 mg via ORAL
  Filled 2017-06-19: qty 1

## 2017-06-19 MED ORDER — SODIUM CHLORIDE 0.45 % IV SOLN
INTRAVENOUS | Status: DC
  Administered 2017-06-19 (×2): via INTRAVENOUS

## 2017-06-19 MED ORDER — MAGNESIUM HYDROXIDE 400 MG/5ML PO SUSP
30.0000 mL | Freq: Every day | ORAL | Status: DC | PRN
  Administered 2017-06-20: 30 mL via ORAL
  Filled 2017-06-19: qty 30

## 2017-06-19 MED ORDER — OXYCODONE HCL 5 MG PO TABS: 5.0000 mg | ORAL_TABLET | ORAL | 0 refills | Status: DC | PRN

## 2017-06-19 MED ORDER — FLEET ENEMA 7-19 GM/118ML RE ENEM: 1.0000 | ENEMA | Freq: Once | RECTAL | Status: DC | PRN

## 2017-06-19 MED ORDER — ACETAMINOPHEN 325 MG PO TABS
650.0000 mg | ORAL_TABLET | Freq: Four times a day (QID) | ORAL | Status: DC | PRN
  Filled 2017-06-19: qty 2

## 2017-06-19 MED ORDER — BISACODYL 10 MG RE SUPP
10.0000 mg | Freq: Every day | RECTAL | Status: DC | PRN
  Administered 2017-06-20: 10 mg via RECTAL
  Filled 2017-06-19: qty 1

## 2017-06-19 MED ORDER — MENTHOL 3 MG MT LOZG
1.0000 | LOZENGE | OROMUCOSAL | Status: DC | PRN
  Filled 2017-06-19: qty 9

## 2017-06-19 MED ORDER — FERROUS SULFATE 325 (65 FE) MG PO TABS
325.0000 mg | ORAL_TABLET | Freq: Every day | ORAL | Status: DC
  Administered 2017-06-19 – 2017-06-20 (×2): 325 mg via ORAL
  Filled 2017-06-19 (×2): qty 1

## 2017-06-19 MED ORDER — ACETAMINOPHEN 500 MG PO TABS
1000.0000 mg | ORAL_TABLET | Freq: Four times a day (QID) | ORAL | Status: AC | PRN
Start: 2017-06-19 — End: 2017-06-20

## 2017-06-19 MED ORDER — METOCLOPRAMIDE HCL 5 MG/ML IJ SOLN
5.0000 mg | Freq: Three times a day (TID) | INTRAMUSCULAR | Status: DC | PRN
  Administered 2017-06-19: 10 mg via INTRAVENOUS
  Filled 2017-06-19: qty 2

## 2017-06-19 MED ORDER — ONDANSETRON HCL 4 MG/2ML IJ SOLN
4.0000 mg | Freq: Four times a day (QID) | INTRAMUSCULAR | Status: DC | PRN
  Filled 2017-06-19: qty 2

## 2017-06-19 MED ORDER — ENOXAPARIN SODIUM 30 MG/0.3ML ~~LOC~~ SOLN: 30.0000 mg | SUBCUTANEOUS | Status: DC

## 2017-06-19 MED ORDER — ONDANSETRON HCL 4 MG PO TABS: 4.0000 mg | ORAL_TABLET | Freq: Four times a day (QID) | ORAL | Status: DC | PRN

## 2017-06-19 MED ORDER — CEFAZOLIN SODIUM-DEXTROSE 2-4 GM/100ML-% IV SOLN
2.0000 g | Freq: Three times a day (TID) | INTRAVENOUS | Status: AC
  Administered 2017-06-19 (×3): 2 g via INTRAVENOUS
  Filled 2017-06-19 (×3): qty 100

## 2017-06-19 MED ORDER — CLINDAMYCIN PHOSPHATE 600 MG/50ML IV SOLN
600.0000 mg | Freq: Three times a day (TID) | INTRAVENOUS | Status: AC
  Administered 2017-06-19 (×3): 600 mg via INTRAVENOUS
  Filled 2017-06-19 (×3): qty 50

## 2017-06-19 MED ORDER — PHENOL 1.4 % MT LIQD
1.0000 | OROMUCOSAL | Status: DC | PRN
  Filled 2017-06-19: qty 177

## 2017-06-19 MED ORDER — ACETAMINOPHEN 650 MG RE SUPP: 650.0000 mg | Freq: Four times a day (QID) | RECTAL | Status: DC | PRN

## 2017-06-19 MED ORDER — METOCLOPRAMIDE HCL 10 MG PO TABS: 5.0000 mg | ORAL_TABLET | Freq: Three times a day (TID) | ORAL | Status: DC | PRN

## 2017-06-19 MED ORDER — SENNA 8.6 MG PO TABS
1.0000 | ORAL_TABLET | Freq: Two times a day (BID) | ORAL | Status: DC
  Administered 2017-06-19 – 2017-06-20 (×3): 8.6 mg via ORAL
  Filled 2017-06-19 (×3): qty 1

## 2017-06-19 MED ORDER — HYDROCODONE-ACETAMINOPHEN 5-325 MG PO TABS
1.0000 | ORAL_TABLET | Freq: Four times a day (QID) | ORAL | Status: DC | PRN
  Administered 2017-06-19 – 2017-06-20 (×3): 1 via ORAL
  Filled 2017-06-19 (×3): qty 1

## 2017-06-19 MED ORDER — ENOXAPARIN SODIUM 30 MG/0.3ML ~~LOC~~ SOLN
30.0000 mg | SUBCUTANEOUS | Status: DC
  Administered 2017-06-19 – 2017-06-20 (×2): 30 mg via SUBCUTANEOUS
  Filled 2017-06-19 (×2): qty 0.3

## 2017-06-19 MED ORDER — ALUM & MAG HYDROXIDE-SIMETH 200-200-20 MG/5ML PO SUSP: 30.0000 mL | ORAL | Status: DC | PRN

## 2017-06-19 MED ORDER — ZOLPIDEM TARTRATE 5 MG PO TABS: 5.0000 mg | ORAL_TABLET | Freq: Every evening | ORAL | Status: DC | PRN

## 2017-06-19 MED ORDER — MORPHINE SULFATE (PF) 2 MG/ML IV SOLN: 1.0000 mg | INTRAVENOUS | Status: DC | PRN

## 2017-06-19 MED ORDER — ROSUVASTATIN CALCIUM 10 MG PO TABS
5.0000 mg | ORAL_TABLET | Freq: Every day | ORAL | Status: DC
  Administered 2017-06-20: 5 mg via ORAL
  Filled 2017-06-19: qty 1

## 2017-06-19 NOTE — Progress Notes (Signed)
Subjective: 1 Day Post-Op Procedure(s) (LRB): INTRAMEDULLARY (IM) NAIL INTERTROCHANTRIC (Left)   Patient reports pain as mild.  Objective: Alert and oriented.  csm good.  Rom of hip good.  hgb stable. Has been oob in chair and started PT  VITALS:   Vitals:   06/19/17 0723 06/19/17 1122  BP: (!) 130/46 (!) 130/44  Pulse: 72 73  Resp: 18 18  Temp: 98 F (36.7 C) 97.8 F (36.6 C)    Neurologically intact ABD soft Neurovascular intact Sensation intact distally Intact pulses distally Dorsiflexion/Plantar flexion intact Incision: scant drainage  LABS  Recent Labs  06/18/17 1350 06/19/17 0717  HGB 12.3 10.2*  HCT 36.2 30.9*  WBC 6.7 13.0*  PLT 232 190     Recent Labs  06/18/17 1401 06/19/17 0717  NA 141 138  K 4.6 3.8  BUN 19 16  CREATININE 0.94 0.88  GLUCOSE 98 160*     Recent Labs  06/18/17 1401  INR 0.96     Assessment/Plan: 1 Day Post-Op Procedure(s) (LRB): INTRAMEDULLARY (IM) NAIL INTERTROCHANTRIC (Left)   Advance diet Up with therapy D/C IV fluids Discharge to SNF

## 2017-06-19 NOTE — Anesthesia Postprocedure Evaluation (Signed)
Anesthesia Post Note  Patient: Christina RenshawRuth J [name]  Procedure(s) Performed: Procedure(s) (LRB): INTRAMEDULLARY (IM) NAIL INTERTROCHANTRIC (Left)  Patient location during evaluation: PACU Anesthesia Type: General Level of consciousness: awake and alert Pain management: pain level controlled Vital Signs Assessment: post-procedure vital signs reviewed and stable Respiratory status: spontaneous breathing and respiratory function stable Cardiovascular status: stable Anesthetic complications: no     Last Vitals:  Vitals:   06/18/17 2345 06/19/17 0000  BP: (!) 93/56 (!) 113/57  Pulse: 73 76  Resp: 17 20  Temp:  36.3 C    Last Pain:  Vitals:   06/19/17 0000  TempSrc:   PainSc: 0-No pain                 KEPHART,WILLIAM K

## 2017-06-19 NOTE — Evaluation (Signed)
Physical Therapy Evaluation Patient Details Name: Christina [name] MRN: [id number] DOB: 1920-08-22 Today's Date: 06/19/2017   History of Present Illness  83 y/o female s/p a L hip fx after a fall. Pt fell yesterday (06/18/17) while gardening. When she stood up, her legs became very weak. She attempted to reach for her rollator, but was unable to get there in time to stop her from falling. Dr. Hyacinth Meeker performed an internal fixation surgery (IM nail) in the late evening of 06/18/17. PMH includes CAD, stents (2008), chronic back pain, HTN, HLD, OA, osteoporosis, and neuropathy.    Clinical Impression  Pt is a pleasant 83 year old admitted for a L hip fx s/p a fall. She is now POD 1 from a L hip internal fixation surgery. This session limited due to pt's pain and caution moving/weight bearing through LLE. Pt performs bed mobility and transfers with mod assist. Ambulation deferred due to pt's pain and apprehension weight bearing through LLE. Attempted transfer from EOB to recliner with RW 2x, but pt unsuccessful with this strategy. Pt tolerated standing with BUE support on RW before requesting to sit back down. With bed/chair rails dropped and HOB flattened, pt able to scoot toward [facility] and perform squat pivot transfer with mod-max assist to transfer to recliner. Pt states her pain was a 4/10 once settled in the chair, but her pain peaked at 8/10 this session. RN notified pt requesting pain medication. Pt demonstrates deficits in strength, activity tolerance, and pain. Would benefit from skilled PT to address above deficits and promote optimal return to PLOF. Pt is motivated to participate in therapy despite intense pain and caution with LLE movement. PT recommends dc to SNF given pt's deficits described above.     Follow Up Recommendations SNF    Equipment Recommendations  3in1 (PT)    Recommendations for Other Services       Precautions / Restrictions Precautions Precautions: Fall Restrictions Weight  Bearing Restrictions: Yes LLE Weight Bearing: Partial weight bearing      Mobility  Bed Mobility Overal bed mobility: Needs Assistance Bed Mobility: Supine to Sit     Supine to sit: Mod assist;HOB elevated     General bed mobility comments: Heavy verbal cueing for sequencing and hand placement. PT provided mod assist for both LLE management and to pull pt to seated position. No dizziness noted once EOB. Mod assist required due to pain and caution moving LLE.  Transfers Overall transfer level: Needs assistance Equipment used: Rolling walker (2 wheeled) Transfers: Sit to/from Stand Sit to Stand: Mod assist         General transfer comment: Verbal cueing for hand placement. Pt unable to put any weight through LLE due to pain. No dizziness noted once standing.   Ambulation/Gait             General Gait Details: Deferred due to pt's pain and inability to weight bear through LLE or utilize RW to offload her LLE for ambulation from EOB to recliner.  Stairs            Wheelchair Mobility    Modified Rankin (Stroke Patients Only)       Balance Overall balance assessment: No apparent balance deficits (not formally assessed);History of Falls (Stood with BUE support on RW. No LOB or unsteadiness.)  Pertinent Vitals/Pain Pain Assessment: 0-10 Pain Score: 5  Pain Location: L hip Pain Descriptors / Indicators: Aching;Discomfort;Guarding;Grimacing;Operative site guarding Pain Intervention(s): Limited activity within patient's tolerance;Monitored during session;Premedicated before session;Repositioned;Patient requesting pain meds-RN notified    Home Living Family/patient expects to be discharged to:: Private residence Living Arrangements: Alone Available Help at Discharge: Family;Available PRN/intermittently Type of Home: House Home Access: Stairs to enter Entrance Stairs-Rails: Can reach both Entrance  Stairs-Number of Steps: 4 Home Layout: One level Home Equipment: Environmental consultant - 2 wheels (rollator)      Prior Function Level of Independence: Independent with assistive device(s)         Comments: Pt states she "stays in her home", but is independent in all her ADL's, cooking, gardening, etc. Pt ambulates with a rollator device at all times.      Hand Dominance        Extremity/Trunk Assessment   Upper Extremity Assessment Upper Extremity Assessment: Generalized weakness (4/5 B for grip and elbow flex/ext)    Lower Extremity Assessment Lower Extremity Assessment: Generalized weakness (RLE grossly 4+/5 for PF/DF, knee flex/ext. Did not test LLE.)       Communication   Communication: No difficulties  Cognition Arousal/Alertness: Awake/alert Behavior During Therapy: WFL for tasks assessed/performed Overall Cognitive Status: Within Functional Limits for tasks assessed                                 General Comments: Pt very fearful and cautious to move/weight bear through her LLE due to pain.      General Comments      Exercises Other Exercises Other Exercises: Sit to/from stand - mod assist provided with RW and verbal cueing for hand placement. Pt able to put some weight through LLE. Pt tolerated standing position for roughly before requesting to sit back down at EOB. Pt unable to transfer to recliner with RW. Pt then able to scoot toward [facility] with supervision and perform squat pivot transfer with mod-max assist from PT to transfer from EOB to recliner. Pt states she was dizzy after this transfer, but this settled within . Pt states her pain was a 4/10 once settled in recliner.  Other Exercises: Supine ther-ex x10 included: (RLE: ankle pumps, SLR's) and (LLE: ankle pumps, hip abd). Performed with supervision and verbal cueing for encouragement and sequencing.   Assessment/Plan    PT Assessment Patient needs continued PT services  PT Problem List  Decreased strength;Decreased activity tolerance;Decreased balance;Pain       PT Treatment Interventions Gait training;Stair training;Therapeutic activities;Therapeutic exercise;Balance training;Patient/family education    PT Goals (Current goals can be found in the Care Plan section)  Acute Rehab PT Goals Patient Stated Goal: to have less pain PT Goal Formulation: With patient Time For Goal Achievement: 07/03/17 Potential to Achieve Goals: Good    Frequency BID   Barriers to discharge        Co-evaluation               AM-PAC PT "6 Clicks" Daily Activity  Outcome Measure Difficulty turning over in bed (including adjusting bedclothes, sheets and blankets)?: A Lot Difficulty moving from lying on back to sitting on the side of the bed? : Total Difficulty sitting down on and standing up from a chair with arms (e.g., wheelchair, bedside commode, etc,.)?: Total Help needed moving to and from a bed to chair (including a wheelchair)?: A Lot Help needed walking in hospital  room?: Total Help needed climbing 3-5 steps with a railing? : Total 6 Click Score: 8    End of Session Equipment Utilized During Treatment: Gait belt Activity Tolerance: Patient limited by pain Patient left: in chair;with call bell/phone within reach;with SCD's reapplied Nurse Communication: Mobility status;Patient requests pain meds PT Visit Diagnosis: Unsteadiness on feet (R26.81);Muscle weakness (generalized) (M62.81);History of falling (Z91.81);Pain Pain - Right/Left: Left Pain - part of body: Hip    Time: 4098-11910919-0958 PT Time Calculation (min) (ACUTE ONLY): 39 min   Charges:         PT G Codes:        Mabeline CarasMichelle C Magaly Pollina, PT, SPT  Diesha Rostad 06/19/2017, 10:22 AM

## 2017-06-19 NOTE — Progress Notes (Signed)
Physical Therapy Treatment Patient Details Name: Christina [name] MRN: [id number] DOB: February 16, 1920 Today's Date: 06/19/2017    History of Present Illness 83 y/o female s/p a L hip fx after a fall. Pt fell yesterday (06/18/17) while gardening. When she stood up, her legs became very weak. She attempted to reach for her rollator, but was unable to get there in time to stop her from falling. Dr. Hyacinth Meeker performed an internal fixation surgery (IM nail) in the late evening of 06/18/17. PMH includes CAD, stents (2008), chronic back pain, HTN, HLD, OA, osteoporosis, and neuropathy.    PT Comments    This session limited due to pt's L hip pain and apprehension to move out of bed. Pt able to perform there-ex (LE and UE) with supervision and verbal cueing for encouragement. Bed mobility was attempted (supine to EOB), but pt refused to proceed due to LLE pain and complaints of nausea (secondary to pain). Pt states she wants to rest this afternoon and eat lunch, which had arrived to room roughly into this session. Will continue to progress.    Follow Up Recommendations  SNF     Equipment Recommendations  3in1 (PT)    Recommendations for Other Services       Precautions / Restrictions Precautions Precautions: Fall Restrictions Weight Bearing Restrictions: Yes LLE Weight Bearing: Partial weight bearing    Mobility  Bed Mobility Overal bed mobility: Needs Assistance Bed Mobility: Supine to Sit     Supine to sit: Mod assist;HOB elevated     General bed mobility comments: Deferred due to pt's pain and refusal to move LLE out of bed this session.   Transfers Overall transfer level: Needs assistance Equipment used: Rolling walker (2 wheeled) Transfers: Sit to/from Stand Sit to Stand: Mod assist         General transfer comment: Deferred due to pt's pain and refusal to move LLE out of bed this session.   Ambulation/Gait             General Gait Details: Deferred due to pt's pain and  refusal to move LLE out of bed this session.    Stairs            Wheelchair Mobility    Modified Rankin (Stroke Patients Only)       Balance Overall balance assessment: No apparent balance deficits (not formally assessed);History of Falls                                          Cognition Arousal/Alertness: Awake/alert Behavior During Therapy: WFL for tasks assessed/performed Overall Cognitive Status: Within Functional Limits for tasks assessed                                 General Comments: Pt apprehensive about moving LLE in bed due to pain.      Exercises Other Exercises Other Exercises: Supine ther-ex x 12 B included: ankle pumps, glute squeezes, SLR's (RLE), shoulder flexion, and elbow flexion/ext. Performed with supervision and verbal cueing for sequencing and encouragement. Quad sets deferred due to pt's pain on LLE. Other Exercises: Supine ther-ex x10 included: (RLE: ankle pumps, SLR's) and (LLE: ankle pumps, hip abd). Performed with supervision and verbal cueing for encouragement and sequencing.    General Comments        Pertinent Vitals/Pain Pain  Assessment: Faces Pain Score: 5  Faces Pain Scale: Hurts even more Pain Location: L hip Pain Descriptors / Indicators: Aching;Discomfort;Guarding;Grimacing;Operative site guarding Pain Intervention(s): Limited activity within patient's tolerance;Monitored during session;Premedicated before session;Repositioned;Patient requesting pain meds-RN notified    Home Living Family/patient expects to be discharged to:: Private residence Living Arrangements: Alone Available Help at Discharge: Family;Available PRN/intermittently Type of Home: House Home Access: Stairs to enter Entrance Stairs-Rails: Can reach both Home Layout: One level Home Equipment: Environmental consultantWalker - 2 wheels (rollator)      Prior Function Level of Independence: Independent with assistive device(s)      Comments: Pt  states she "stays in her home", but is independent in all her ADL's, cooking, gardening, etc. Pt ambulates with a rollator device at all times.    PT Goals (current goals can now be found in the care plan section) Acute Rehab PT Goals Patient Stated Goal: to have less pain PT Goal Formulation: With patient Time For Goal Achievement: 07/03/17 Potential to Achieve Goals: Good Progress towards PT goals: PT to reassess next treatment    Frequency    BID      PT Plan Current plan remains appropriate    Co-evaluation              AM-PAC PT "6 Clicks" Daily Activity  Outcome Measure  Difficulty turning over in bed (including adjusting bedclothes, sheets and blankets)?: A Lot Difficulty moving from lying on back to sitting on the side of the bed? : Total Difficulty sitting down on and standing up from a chair with arms (e.g., wheelchair, bedside commode, etc,.)?: Total Help needed moving to and from a bed to chair (including a wheelchair)?: A Lot Help needed walking in hospital room?: Total Help needed climbing 3-5 steps with a railing? : Total 6 Click Score: 8    End of Session Equipment Utilized During Treatment: Other (comment) (none) Activity Tolerance: Patient limited by pain Patient left: in bed;with call bell/phone within reach;with bed alarm set;with family/visitor present;with SCD's reapplied Nurse Communication: Mobility status PT Visit Diagnosis: Unsteadiness on feet (R26.81);Muscle weakness (generalized) (M62.81);History of falling (Z91.81);Pain Pain - Right/Left: Left Pain - part of body: Hip     Time: [id number] PT Time Calculation (min) (ACUTE ONLY): 15 min  Charges:  $Therapeutic Exercise: 8-22 mins $Therapeutic Activity: 8-22 mins                    G Codes:       Mabeline CarasMichelle C Zyanya Glaza, PT, SPT  [place] S[place] G[place] 06/19/2017, 1:41 PM

## 2017-06-19 NOTE — Clinical Social Work Note (Signed)
Clinical Social Work Assessment  Patient Details  Name: Christina [name] MRN: [id number] Date of Birth: 09-04-1920  Date of referral:  06/19/17               Reason for consult:  Facility Placement                Permission sought to share information with:  Chartered certified accountant granted to share information::  Yes, Verbal Permission Granted  Name::      [place]::   [place]   Relationship::     Contact Information:     Housing/Transportation Living arrangements for the past 2 months:  [place] of Information:  Patient Patient Interpreter Needed:  None Criminal Activity/Legal Involvement Pertinent to Current Situation/Hospitalization:  No - Comment as needed Significant Relationships:  Adult Children, Other Family Members Lives with:  Self Do you feel safe going back to the place where you live?  Yes Need for family participation in patient care:  Yes (Comment)  Care giving concerns:  Patient lives in [place] alone and has 2 adult children and several grandchildren.    Social Worker assessment / plan:  Holiday representative (CSW) received SNF consult. PT is recommending SNF. CSW met with patient and her grandson Christina [name] was at bedside. Patient was alert and oriented X4 and was laying in the bed. CSW introduced self and explained role of CSW department. Patient reported that she lives alone in [place]. CSW explained SNF process. Patient is agreeable to SNF search and reported that she has been to [facility] before and had a shared room with her husband. FL2 complete and faxed out.   CSW presented bed offers to patient and grandson and they chose [facility] admissions coordinator at [facility] is aware of accepted bed offer.   Employment status:  Retired Nurse, adult PT Recommendations:  [place] / Referral to community resources:  [place]  Patient/Family's Response to care:  Patient is agreeable to D/C to a shared room at [facility].   Patient/Family's Understanding of and Emotional Response to Diagnosis, Current Treatment, and Prognosis:  Patient and her grandson were very pleasant and thanked CSW for visit.   Emotional Assessment Appearance:  Appears stated age Attitude/Demeanor/Rapport:    Affect (typically observed):  Accepting, Adaptable, Pleasant Orientation:  Oriented to Self, Oriented to Place, Oriented to  Time, Oriented to Situation Alcohol / Substance use:  Not Applicable Psych involvement (Current and /or in the community):  No (Comment)  Discharge Needs  Concerns to be addressed:  Discharge Planning Concerns Readmission within the last 30 days:  No Current discharge risk:  Dependent with Mobility Barriers to Discharge:  Continued Medical Work up   [employer], Veronia Beets, LCSW 06/19/2017, 12:22 PM

## 2017-06-19 NOTE — Progress Notes (Signed)
[facility] -  at [facility]   PATIENT NAME: Christina [name]    MR#:  [id number]  DATE OF BIRTH:  22-Nov-1920  SUBJECTIVE:   Patient here after a mechanical fall and noted to have a left hip fracture. Patient is status post intramedullary pain postop day #1 today. No acute events overnight.   REVIEW OF SYSTEMS:    Review of Systems  Constitutional: Negative for chills and fever.  HENT: Negative for congestion and tinnitus.   Eyes: Negative for blurred vision and double vision.  Respiratory: Negative for cough, shortness of breath and wheezing.   Cardiovascular: Negative for chest pain, orthopnea and PND.  Gastrointestinal: Negative for abdominal pain, diarrhea, nausea and vomiting.  Genitourinary: Negative for dysuria and hematuria.  Neurological: Negative for dizziness, sensory change and focal weakness.  All other systems reviewed and are negative.   Nutrition: Regular Tolerating Diet: Yes Tolerating PT: Eval noted.    DRUG ALLERGIES:   Allergies  Allergen Reactions  . Atorvastatin Other (See Comments)    Reaction:  Blisters on feet   . Diltiazem Hcl Other (See Comments)    Reaction:  Unknown   . Dye Fdc Red [Red Dye] Other (See Comments)    Reaction:  Unknown   . Gemfibrozil Other (See Comments)    Reaction:  Unknown   . Niacin Other (See Comments)    Reaction:  Unknown   . Sulfasalazine Rash    VITALS:  Blood pressure (!) 130/44, pulse 73, temperature 97.8 F (36.6 C), temperature source Oral, resp. rate 18, height 5' (1.524 m), weight 51.3 kg (113 lb), SpO2 98 %.  PHYSICAL EXAMINATION:   Physical Exam  GENERAL:  83 y.o.-year-old patient lying in bed in no acute distress.  EYES: Pupils equal, round, reactive to light and accommodation. No scleral icterus. Extraocular muscles intact.  HEENT: Head atraumatic, normocephalic. Oropharynx and nasopharynx clear.  NECK:  Supple, no jugular venous distention. No thyroid enlargement, no tenderness.   LUNGS: Normal breath sounds bilaterally, no wheezing, rales, rhonchi. No use of accessory muscles of respiration.  CARDIOVASCULAR: S1, S2 normal. No murmurs, rubs, or gallops.  ABDOMEN: Soft, nontender, nondistended. Bowel sounds present. No organomegaly or mass.  EXTREMITIES: No cyanosis, clubbing or edema b/l. Left hip dressing in place with no acute bleeding.     NEUROLOGIC: Cranial nerves II through XII are intact. No focal Motor or sensory deficits b/l.   PSYCHIATRIC: The patient is alert and oriented x 3.  SKIN: No obvious rash, lesion, or ulcer.    LABORATORY PANEL:   CBC  Recent Labs Lab 06/19/17 0717  WBC 13.0*  HGB 10.2*  HCT 30.9*  PLT 190   ------------------------------------------------------------------------------------------------------------------  Chemistries   Recent Labs Lab 06/18/17 1401 06/19/17 0717  NA 141 138  K 4.6 3.8  CL 108 108  CO2 27 25  GLUCOSE 98 160*  BUN 19 16  CREATININE 0.94 0.88  CALCIUM 9.8 8.2*  AST 17  --   ALT 13*  --   ALKPHOS 49  --   BILITOT 0.7  --    ------------------------------------------------------------------------------------------------------------------  Cardiac Enzymes No results for input(s): TROPONINI in the last 168 hours. ------------------------------------------------------------------------------------------------------------------  RADIOLOGY:  Dg C-arm 1-60 Min  Result Date: 06/18/2017 CLINICAL DATA:  Internal fixation of left hip fracture. Initial encounter. EXAM: OPERATIVE LEFT HIP (WITH PELVIS IF PERFORMED) 2 VIEWS TECHNIQUE: Fluoroscopic spot image(s) were submitted for interpretation post-operatively. COMPARISON:  Left hip radiographs performed earlier today at 2:36 p.m. FINDINGS: Five  fluoroscopic C-arm images are provided from the OR, demonstrating placement of a dynamic left hip screw, transfixing the patient's left femoral intertrochanteric fracture in near anatomic alignment. No new  fractures are seen. Overlying postoperative soft tissue air is noted. IMPRESSION: Status post internal fixation of left femoral intertrochanteric fracture in near anatomic alignment. Electronically Signed   By: Roanna RaiderJeffery  Chang M.D.   On: 06/18/2017 23:10   Dg Hip Operative Unilat With Pelvis Left  Result Date: 06/18/2017 CLINICAL DATA:  Internal fixation of left hip fracture. Initial encounter. EXAM: OPERATIVE LEFT HIP (WITH PELVIS IF PERFORMED) 2 VIEWS TECHNIQUE: Fluoroscopic spot image(s) were submitted for interpretation post-operatively. COMPARISON:  Left hip radiographs performed earlier today at 2:36 p.m. FINDINGS: Five fluoroscopic C-arm images are provided from the OR, demonstrating placement of a dynamic left hip screw, transfixing the patient's left femoral intertrochanteric fracture in near anatomic alignment. No new fractures are seen. Overlying postoperative soft tissue air is noted. IMPRESSION: Status post internal fixation of left femoral intertrochanteric fracture in near anatomic alignment. Electronically Signed   By: Roanna RaiderJeffery  Chang M.D.   On: 06/18/2017 23:10   Dg Hip Unilat With Pelvis 2-3 Views Left  Result Date: 06/18/2017 CLINICAL DATA:  Status post fall after working in the garden sepsis afternoon with left hip pain. EXAM: DG HIP (WITH OR WITHOUT PELVIS) 2-3V LEFT COMPARISON:  June 22nd 2016 FINDINGS: There is comminuted displaced fracture of the intertrochanteric left femur. Patient status post prior fixation of the right femur without malalignment. IMPRESSION: Comminuted displaced fracture of the intertrochanteric left femur. Electronically Signed   By: Sherian ReinWei-Chen  Lin M.D.   On: 06/18/2017 14:59     ASSESSMENT AND PLAN:   Christina [name]  is a 83 y.o. female with a known history of CAD status post stents in 2008, chronic back pain, hypertension, hyperlipidemia and osteoporosis presents to hospital after a fall and left hip pain.  #1 left hip fracture-x-ray with comminuted displaced  left intertrochanteric fracture -Seen by orthopedics and status post left-sided intramedullary pinning. Postop day #1 today. -Continue pain control and physical therapy as tolerated. - Lovenox for DVT prophylaxis.   #2 CAD-stable. No acute chest pain.  - cont. ASA, Coreg, Crestor.   #3 hypertension- BP stable.  - will resume Coreg and hold Imdur, Lisinopril.  Will resume other anti-HTN tomorrow.   #4 Hyperlipidemia - cont. Crestor  All the records are reviewed and case discussed with Care Management/Social Worker. Management plans discussed with the patient, family and they are in agreement.  CODE STATUS: DNR  DVT Prophylaxis: Lovenox  TOTAL TIME TAKING CARE OF THIS PATIENT: 30 minutes.   POSSIBLE D/C IN 1-2 DAYS, DEPENDING ON CLINICAL CONDITION.   [place] SirenSAINANI,Meryle Pugmire J M.D on 06/19/2017 at 2:44 PM  Between 7am to 6pm - Pager - [phone]  After 6pm go to [email or web address] - Social research officer, governmentpassword [email or web address] [email or web address]  [facility] [place] [facility]  Office  [phone]  CC: Primary care physician; Karie SchwalbeLetvak, Richard I, MD

## 2017-06-19 NOTE — Discharge Summary (Addendum)
[facility] - [place] [facility]   PATIENT NAME: Caliana Spires    MR#:  [id number]  DATE OF BIRTH:  Dec 03, 1920  DATE OF ADMISSION:  06/18/2017 ADMITTING PHYSICIAN: Enid Baas, MD  DATE OF DISCHARGE: 06/21/17  PRIMARY CARE PHYSICIAN: Karie Schwalbe, MD    ADMISSION DIAGNOSIS:  Closed fracture of left hip, initial encounter (HCC) [S72.002A]  DISCHARGE DIAGNOSIS:  Active Problems:   Hip fracture (HCC)   SECONDARY DIAGNOSIS:   Past Medical History:  Diagnosis Date  . CAD (coronary artery disease)    a. S/P previous MI;  b. 04/2007 Cath/PCI: Taxus DES' to mid/distal RCA and RPDA as well as Ramus;  c. myoview 8/09: EF 78%, normal perfusion  . Carotid stenosis    Bilateral, mild to moderate  . Chronic back pain   . Chronic shoulder pain   . Hyperlipidemia    Intolerant of many statins  . Hypertension   . Mild mitral and aortic regurgitation    a. 11/2011 Echo: EF 55-65%, No RWMA, Gr 1 DD, Mild AI/MR.  Marland Kitchen Neuropathy   . Osteoarthritis   . Osteoporosis   . Pelvic fracture (HCC) 6/16  . Peripheral vascular disease [facility])     HOSPITAL COURSE:   Azana Kiesler a 83 y.o. femalewith a known history of CAD status post stents in 2008, chronic back pain, hypertension, hyperlipidemia and osteoporosis presents to hospital after a fall and left hip pain.  #1 left hip fracture-x-ray with comminuted displaced left intertrochanteric fracture -Seen by orthopedics and status post left-sided intramedullary pinning. Postop day #2 today. - pt's pain is well controlled with oral Oxycodone.  She is tolerating PT well.  - will d/c to SNF for ongoing rehab.  Cont. Lovenox for 14 days for DVT prophylaxis.  - follow up with Orthopedics as outpatient.  - Lovenox for DVT prophylaxis.   #2 CAD-stable. No acute chest pain while in the hospital.   - cont. ASA, Coreg, Crestor.   #3 hypertension- BP stable.  - pt. Will resume her Coreg, Imdur, Lisinopril upon discharge.    #4 Hyperlipidemia - she will cont. Crestor  #5 Vertigo/dizziness - pt. Will cont. Her Meclizine as needed.   DISCHARGE CONDITIONS:   Stable  CONSULTS OBTAINED:  Treatment Team:  Deeann Saint, MD  DRUG ALLERGIES:   Allergies  Allergen Reactions  . Atorvastatin Other (See Comments)    Reaction:  Blisters on feet   . Diltiazem Hcl Other (See Comments)    Reaction:  Unknown   . Dye Fdc Red [Red Dye] Other (See Comments)    Reaction:  Unknown   . Gemfibrozil Other (See Comments)    Reaction:  Unknown   . Niacin Other (See Comments)    Reaction:  Unknown   . Sulfasalazine Rash    DISCHARGE MEDICATIONS:   Allergies as of 06/19/2017      Reactions   Atorvastatin Other (See Comments)   Reaction:  Blisters on feet    Diltiazem Hcl Other (See Comments)   Reaction:  Unknown    Dye Fdc Red [red Dye] Other (See Comments)   Reaction:  Unknown    Gemfibrozil Other (See Comments)   Reaction:  Unknown    Niacin Other (See Comments)   Reaction:  Unknown    Sulfasalazine Rash      Medication List    TAKE these medications   aspirin EC 81 MG tablet Take 81 mg by mouth daily.   bisacodyl 10 MG suppository Commonly known  as:  DULCOLAX Place 1 suppository (10 mg total) rectally daily as needed for moderate constipation.   carvedilol 3.125 MG tablet Commonly known as:  COREG TAKE 1 TABLET BY MOUTH TWO  TIMES DAILY   enoxaparin 30 MG/0.3ML injection Commonly known as:  LOVENOX Inject 0.3 mLs (30 mg total) into the skin daily. Start taking on:  06/20/2017   ibandronate 150 MG tablet Commonly known as:  BONIVA Take 1 tablet (150 mg total) by mouth every 30 (thirty) days. Take in the morning with a full glass of water, on an empty stomach, and do not take anything else by mouth or lie down for the next 30 min.   isosorbide mononitrate 30 MG 24 hr tablet Commonly known as:  IMDUR TAKE 1 TABLET BY MOUTH  DAILY   lisinopril 10 MG tablet Commonly known as:   PRINIVIL,ZESTRIL TAKE 1 TABLET BY MOUTH  DAILY   meclizine 25 MG tablet Commonly known as:  ANTIVERT Take 1 tablet (25 mg total) by mouth 3 (three) times daily as needed for dizziness.   MEGARED OMEGA-3 KRILL OIL 500 MG Caps Take 1 capsule by mouth daily.   menthol-cetylpyridinium 3 MG lozenge Commonly known as:  CEPACOL Take 1 lozenge (3 mg total) by mouth as needed for sore throat.   MULTIVITAMIN PO Take 1 tablet by mouth daily.   nitroGLYCERIN 0.4 MG SL tablet Commonly known as:  NITROSTAT Place 1 tablet (0.4 mg total) under the tongue every 5 (five) minutes as needed for chest pain.   oxyCODONE 5 MG immediate release tablet Commonly known as:  Oxy IR/ROXICODONE Take 1-2 tablets (5-10 mg total) by mouth every 4 (four) hours as needed for breakthrough pain ((for MODERATE breakthrough pain)).   rosuvastatin 5 MG tablet Commonly known as:  CRESTOR TAKE 1 TABLET BY MOUTH  DAILY   senna 8.6 MG Tabs tablet Commonly known as:  SENOKOT Take 1 tablet (8.6 mg total) by mouth 2 (two) times daily.   TYLENOL 8 HOUR 650 MG CR tablet Generic drug:  acetaminophen Take 650 mg by mouth every 8 (eight) hours as needed for pain. 1 tablet at bedtime and as needed during the day         DISCHARGE INSTRUCTIONS:   DIET:  Cardiac diet  DISCHARGE CONDITION:  Stable  ACTIVITY:  Activity as tolerated  OXYGEN:  Home Oxygen: No.   Oxygen Delivery: room air  DISCHARGE LOCATION:  nursing home   If you experience worsening of your admission symptoms, develop shortness of breath, life threatening emergency, suicidal or homicidal thoughts you must seek medical attention immediately by calling 911 or calling your MD immediately  if symptoms less severe.  You Must read complete instructions/literature along with all the possible adverse reactions/side effects for all the Medicines you take and that have been prescribed to you. Take any new Medicines after you have completely understood  and accpet all the possible adverse reactions/side effects.   Please note  You were cared for by a hospitalist during your hospital stay. If you have any questions about your discharge medications or the care you received while you were in the hospital after you are discharged, you can call the unit and asked to speak with the hospitalist on call if the hospitalist that took care of you is not available. Once you are discharged, your primary care physician will handle any further medical issues. Please note that NO REFILLS for any discharge medications will be authorized once you are discharged, as  it is imperative that you return to your primary care physician (or establish a relationship with a primary care physician if you do not have one) for your aftercare needs so that they can reassess your need for medications and monitor your lab values.     Today   No acute events overnight.  S/p left hip intramedullary pinning. Postop day #2. Denies any pain, nausea, vomiting. Constipated and getting stool softeners  VITAL SIGNS:  Blood pressure (!) 130/44, pulse 73, temperature 97.8 F (36.6 C), temperature source Oral, resp. rate 18, height 5' (1.524 m), weight 51.3 kg (113 lb), SpO2 98 %.  I/O:   Intake/Output Summary (Last 24 hours) at 06/19/17 1500 Last data filed at 06/19/17 0900  Gross per 24 hour  Intake             1140 ml  Output              600 ml  Net              540 ml    PHYSICAL EXAMINATION:   GENERAL:  83 y.o.-year-old patient lying in bed in no acute distress.  EYES: Pupils equal, round, reactive to light and accommodation. No scleral icterus. Extraocular muscles intact.  HEENT: Head atraumatic, normocephalic. Oropharynx and nasopharynx clear.  NECK:  Supple, no jugular venous distention. No thyroid enlargement, no tenderness.  LUNGS: Normal breath sounds bilaterally, no wheezing, rales, rhonchi. No use of accessory muscles of respiration.  CARDIOVASCULAR: S1, S2  normal. No murmurs, rubs, or gallops.  ABDOMEN: Soft, nontender, nondistended. Bowel sounds present. No organomegaly or mass.  EXTREMITIES: No cyanosis, clubbing or edema b/l. Left hip dressing in place with no acute bleeding. Left hip dressing in place with no acute drainage or bleeding.   NEUROLOGIC: Cranial nerves II through XII are intact. No focal Motor or sensory deficits b/l.   PSYCHIATRIC: The patient is alert and oriented x 3.  SKIN: No obvious rash, lesion, or ulcer.   DATA REVIEW:   CBC  Recent Labs Lab 06/19/17 0717  WBC 13.0*  HGB 10.2*  HCT 30.9*  PLT 190    Chemistries   Recent Labs Lab 06/18/17 1401 06/19/17 0717  NA 141 138  K 4.6 3.8  CL 108 108  CO2 27 25  GLUCOSE 98 160*  BUN 19 16  CREATININE 0.94 0.88  CALCIUM 9.8 8.2*  AST 17  --   ALT 13*  --   ALKPHOS 49  --   BILITOT 0.7  --     Cardiac Enzymes No results for input(s): TROPONINI in the last 168 hours.   RADIOLOGY:  Dg C-arm 1-60 Min  Result Date: 06/18/2017 CLINICAL DATA:  Internal fixation of left hip fracture. Initial encounter. EXAM: OPERATIVE LEFT HIP (WITH PELVIS IF PERFORMED) 2 VIEWS TECHNIQUE: Fluoroscopic spot image(s) were submitted for interpretation post-operatively. COMPARISON:  Left hip radiographs performed earlier today at 2:36 p.m. FINDINGS: Five fluoroscopic C-arm images are provided from the OR, demonstrating placement of a dynamic left hip screw, transfixing the patient's left femoral intertrochanteric fracture in near anatomic alignment. No new fractures are seen. Overlying postoperative soft tissue air is noted. IMPRESSION: Status post internal fixation of left femoral intertrochanteric fracture in near anatomic alignment. Electronically Signed   By: Roanna Raider M.D.   On: 06/18/2017 23:10   Dg Hip Operative Unilat With Pelvis Left  Result Date: 06/18/2017 CLINICAL DATA:  Internal fixation of left hip fracture. Initial encounter. EXAM: OPERATIVE LEFT HIP (  WITH PELVIS  IF PERFORMED) 2 VIEWS TECHNIQUE: Fluoroscopic spot image(s) were submitted for interpretation post-operatively. COMPARISON:  Left hip radiographs performed earlier today at 2:36 p.m. FINDINGS: Five fluoroscopic C-arm images are provided from the OR, demonstrating placement of a dynamic left hip screw, transfixing the patient's left femoral intertrochanteric fracture in near anatomic alignment. No new fractures are seen. Overlying postoperative soft tissue air is noted. IMPRESSION: Status post internal fixation of left femoral intertrochanteric fracture in near anatomic alignment. Electronically Signed   By: Roanna Raider M.D.   On: 06/18/2017 23:10   Dg Hip Unilat With Pelvis 2-3 Views Left  Result Date: 06/18/2017 CLINICAL DATA:  Status post fall after working in the garden sepsis afternoon with left hip pain. EXAM: DG HIP (WITH OR WITHOUT PELVIS) 2-3V LEFT COMPARISON:  June 22nd 2016 FINDINGS: There is comminuted displaced fracture of the intertrochanteric left femur. Patient status post prior fixation of the right femur without malalignment. IMPRESSION: Comminuted displaced fracture of the intertrochanteric left femur. Electronically Signed   By: Sherian Rein M.D.   On: 06/18/2017 14:59      Management plans discussed with the patient, family and they are in agreement.  CODE STATUS:     Code Status Orders        Start     Ordered   06/18/17 1544  Do not attempt resuscitation (DNR)  Continuous    Question Answer Comment  In the event of cardiac or respiratory ARREST Do not call a "code blue"   In the event of cardiac or respiratory ARREST Do not perform Intubation, CPR, defibrillation or ACLS   In the event of cardiac or respiratory ARREST Use medication by any route, position, wound care, and other measures to relive pain and suffering. May use oxygen, suction and manual treatment of airway obstruction as needed for comfort.      06/18/17 1543        Advance Directive Documentation      Most Recent Value  Type of Advance Directive  Healthcare Power of Attorney  Pre-existing out of facility DNR order (yellow form or pink MOST form)  -  "MOST" Form in Place?  -      TOTAL TIME TAKING CARE OF THIS PATIENT: 40 minutes.    Houston Siren M.D on 06/19/2017 at 3:00 PM  Between 7am to 6pm - Pager - [phone]  After 6pm go to [email or web address] - Social research officer, government  [facility] [place] [facility]  Office  [phone]  CC: Primary care physician; Karie Schwalbe, MD

## 2017-06-19 NOTE — [place] FL2 (Signed)
[place] MEDICAID FL2 LEVEL OF CARE SCREENING TOOL     IDENTIFICATION  Patient Name: Christina RenshawRuth J [name] Birthdate: 10-07-1920 Sex: female Admission Date (Current Location): 06/18/2017  [place]   Facility and Address:  [facility], [address], [place], [place] [address]      Provider Number: [id number]  Attending Physician Name and Address:  Houston SirenSainani, Vivek J, MD  Relative Name and Phone Number:       Current Level of Care: Hospital Recommended Level of Care: Skilled Nursing Facility Prior Approval Number:    Date Approved/Denied:   PASRR Number:  [id number])  Discharge Plan: SNF    Current Diagnoses: Patient Active Problem List   Diagnosis Date Noted  . Hip fracture (HCC) 06/18/2017  . Mild malnutrition (HCC) 03/06/2016  . Mood disorder (HCC) 09/06/2015  . Back pain without sciatica 07/26/2015  . Osteoarthritis, multiple sites 03/04/2015  . Advanced directives, counseling/discussion 09/04/2014  . Mild cognitive impairment 09/04/2014  . Routine general medical examination at a health care facility 08/26/2013  . Carotid arterial disease (HCC)   . Hyperlipidemia 12/04/2007  . Essential hypertension 06/04/2007  . Coronary artery disease with angina pectoris (HCC) 06/04/2007  . Osteoporosis 06/04/2007    Orientation RESPIRATION BLADDER Height & Weight     Self, Place  Normal Incontinent Weight: 113 lb (51.3 kg) Height:  5' (152.4 cm)  BEHAVIORAL SYMPTOMS/MOOD NEUROLOGICAL BOWEL NUTRITION STATUS   (none)  (none) Continent Diet (Diet: Clear Liquid to be advanced.  )  AMBULATORY STATUS COMMUNICATION OF NEEDS Skin   Extensive Assist Verbally Surgical wounds (Incision: Left hip. )                       Personal Care Assistance Level of Assistance  Bathing, Feeding, Dressing Bathing Assistance: Limited assistance Feeding assistance: Independent Dressing Assistance: Limited assistance     Functional  Limitations Info  Sight, Hearing, Speech Sight Info: Impaired Hearing Info: Adequate Speech Info: Adequate    SPECIAL CARE FACTORS FREQUENCY  PT (By licensed PT), OT (By licensed OT)     PT Frequency:  (5) OT Frequency:  (5)            Contractures      Additional Factors Info  Code Status, Allergies Code Status Info:  (DNR ) Allergies Info:  (Atorvastatin, Diltiazem Hcl, Dye Fdc Red Red Dye, Gemfibrozil, Niacin, Sulfasalazine)           Current Medications (06/19/2017):  This is the current hospital active medication list Current Facility-Administered Medications  Medication Dose Route Frequency Provider Last Rate Last Dose  . 0.45 % sodium chloride infusion   Intravenous Continuous Deeann SaintMiller, Howard, MD 75 mL/hr at 06/19/17 0059    . acetaminophen (TYLENOL) tablet 650 mg  650 mg Oral Q6H PRN Deeann SaintMiller, Howard, MD       Or  . acetaminophen (TYLENOL) suppository 650 mg  650 mg Rectal Q6H PRN Deeann SaintMiller, Howard, MD      . acetaminophen (TYLENOL) tablet 1,000 mg  1,000 mg Oral Q6H PRN Deeann SaintMiller, Howard, MD      . alum & mag hydroxide-simeth (MAALOX/MYLANTA) 200-200-20 MG/5ML suspension 30 mL  30 mL Oral Q4H PRN Deeann SaintMiller, Howard, MD      . bisacodyl (DULCOLAX) suppository 10 mg  10 mg Rectal Daily PRN Deeann SaintMiller, Howard, MD      . ceFAZolin (ANCEF) IVPB 2g/100 mL premix  2 g Intravenous Q8H Deeann SaintMiller, Howard, MD   Stopped  at 06/19/17 0614  . clindamycin (CLEOCIN) IVPB 600 mg  600 mg Intravenous Epimenio Foot, MD   Stopped at 06/19/17 0518  . enoxaparin (LOVENOX) injection 30 mg  30 mg Subcutaneous Q24H Deeann Saint, MD   30 mg at 06/19/17 0844  . fentaNYL (SUBLIMAZE) 100 MCG/2ML injection           . ferrous sulfate tablet 325 mg  325 mg Oral Q breakfast Deeann Saint, MD   325 mg at 06/19/17 0843  . HYDROcodone-acetaminophen (NORCO/VICODIN) 5-325 MG per tablet 1-2 tablet  1-2 tablet Oral Q6H PRN Deeann Saint, MD   1 tablet at 06/19/17 [phone]  . magnesium hydroxide (MILK OF MAGNESIA)  suspension 30 mL  30 mL Oral Daily PRN Deeann Saint, MD      . menthol-cetylpyridinium (CEPACOL) lozenge 3 mg  1 lozenge Oral PRN Deeann Saint, MD       Or  . phenol (CHLORASEPTIC) mouth spray 1 spray  1 spray Mouth/Throat PRN Deeann Saint, MD      . metoCLOPramide (REGLAN) tablet 5-10 mg  5-10 mg Oral Q8H PRN Deeann Saint, MD       Or  . metoCLOPramide (REGLAN) injection 5-10 mg  5-10 mg Intravenous Q8H PRN Deeann Saint, MD   10 mg at 06/19/17 0254  . morphine 2 MG/ML injection 1 mg  1 mg Intravenous Q2H PRN Deeann Saint, MD      . ondansetron [facility]) tablet 4 mg  4 mg Oral Q6H PRN Deeann Saint, MD       Or  . ondansetron [facility]) injection 4 mg  4 mg Intravenous Q6H PRN Deeann Saint, MD      . senna [facility]) tablet 8.6 mg  1 tablet Oral BID Deeann Saint, MD   8.6 mg at 06/19/17 0844  . sodium phosphate (FLEET) 7-19 GM/118ML enema 1 enema  1 enema Rectal Once PRN Deeann Saint, MD      . zolpidem Remus Loffler) tablet 5 mg  5 mg Oral QHS PRN Deeann Saint, MD         Discharge Medications: Please see discharge summary for a list of discharge medications.  Relevant Imaging Results:  Relevant Lab Results:   Additional Information  (SSN: [id number])  Federick Levene, Darleen Crocker, LCSW

## 2017-06-19 NOTE — Progress Notes (Signed)
Plan is for patient to [facility] room 204-A pending medical clearance. RN will call report at [phone]. Clinical Child psychotherapistocial Worker (CSW) sent D/C summary to [facility] today. [facility] can come tomorrow. Patient is aware of above. Patient's grandson Trip is at bedside and aware of above. CSW will continue to follow and assist as needed.   Baker Hughes IncorporatedBailey Shandiin Eisenbeis, LCSW [phone]

## 2017-06-19 NOTE — Evaluation (Signed)
Occupational Therapy Evaluation Patient Details Name: Christina RenshawRuth J [name] MRN: [id number] DOB: Jun 16, 1920 Today's Date: 06/19/2017    History of Present Illness Pt. is a 83 y.o. female who was [facility] to [facility] for IM Nailing repair of a Left Hip Fracture sustained during a fall in her garden. Pt. PMHx incldes: Right Hip Fracture approximately one year ago, CAD, Stents, Chronic Back Pain, HTN, HLD, OA, Osteoporosis, Neuropathy.   Clinical Impression   Pt. Presents with weakness, pain, limited functional mobility, and decreased activity tolerance which hinders her ability to complete ADL, and IADL tasks. Pt. education was provided about A/E use to assist with LE ADLs. Pt. Reports having A/E with the exception of the sockaide from a right hip fracture sustained last year. Pt. Is aware of how to use them, however could benefit from additional practice. Pt. Could benefit from skilled OT services to improve ADL performance and assist in regaining independence. Pt. Plans to go to SNF upon discharge.    Follow Up Recommendations  SNF    Equipment Recommendations       Recommendations for Other Services       Precautions / Restrictions Precautions Precautions: Fall Restrictions Weight Bearing Restrictions: Yes LLE Weight Bearing: Partial weight bearing      Mobility Bed Mobility               General bed mobility comments: Deferred due to pt's pain and refusal to move LLE out of bed this session.   Transfers                 General transfer comment: Deferred due to pt's pain and refusal to move LLE out of bed this session.     Balance Overall balance assessment: No apparent balance deficits (not formally assessed);History of Falls                                         ADL either performed or assessed with clinical judgement   ADL Overall ADL's : Needs assistance/impaired Eating/Feeding: Set up   Grooming: Set up       Lower Body Bathing: Maximal  assistance   Upper Body Dressing : Maximal assistance   Lower Body Dressing: Maximal assistance               Functional mobility during ADLs: Moderate assistance;Maximal assistance General ADL Comments: Pt. education was provided about A/E use for LE ADLs.     Vision Patient Visual Report: No change from baseline       Perception     Praxis      Pertinent Vitals/Pain Pain Assessment: 0-10 Pain Score: 5  Faces Pain Scale: Hurts even more Pain Location: Left Hip Fracture Pain Descriptors / Indicators: Aching Pain Intervention(s): Monitored during session;Limited activity within patient's tolerance;Premedicated before session;Patient requesting pain meds-RN notified     Hand Dominance     Extremity/Trunk Assessment Upper Extremity Assessment Upper Extremity Assessment: Generalized weakness           Communication     Cognition Arousal/Alertness: Awake/alert Behavior During Therapy: WFL for tasks assessed/performed Overall Cognitive Status: Within Functional Limits for tasks assessed                                 General Comments: Pt apprehensive about moving LLE in bed due to pain.  General Comments       Exercises Exercises: Other exercises Other Exercises Other Exercises: Supine ther-ex x 12 B included: ankle pumps, glute squeezes, SLR's (RLE), shoulder flexion, and elbow flexion/ext. Performed with supervision and verbal cueing for sequencing and encouragement. Quad sets deferred due to pt's pain on LLE.   Shoulder Instructions      Home Living Family/patient expects to be discharged to:: Private residence Living Arrangements: Alone Available Help at Discharge: Family;Available 24 hours/day;Available PRN/intermittently Type of Home: House Home Access: Stairs to enter [employer] of Steps: 4 Entrance Stairs-Rails: Can reach both Home Layout: One level     Bathroom Shower/Tub: Curtain         Home Equipment:  Bedside commode;Shower seat;Walker - 2 wheels;Walker - 4 wheels;Grab bars - tub/shower          Prior Functioning/Environment Level of Independence: Independent with assistive device(s)        Comments: Independent with ADLs, and IADLs.        OT Problem List: Decreased strength;Pain;Decreased cognition;Decreased knowledge of use of DME or AE;Impaired UE functional use      OT Treatment/Interventions: Self-care/ADL training;Therapeutic exercise;Therapeutic activities;Patient/family education;Energy conservation;DME and/or AE instruction    OT Goals(Current goals can be found in the care plan section) Acute Rehab OT Goals Patient Stated Goal: to have less pain  OT Frequency: Min 1X/week   Barriers to D/C:            Co-evaluation              AM-PAC PT "6 Clicks" Daily Activity     Outcome Measure Help from another person eating meals?: A Little Help from another person taking care of personal grooming?: A Little Help from another person toileting, which includes using toliet, bedpan, or urinal?: A Lot Help from another person bathing (including washing, rinsing, drying)?: A Lot Help from another person to put on and taking off regular upper body clothing?: Total Help from another person to put on and taking off regular lower body clothing?: Total 6 Click Score: 12   End of Session    Activity Tolerance: Patient tolerated treatment well;Patient limited by pain Patient left: in bed  OT Visit Diagnosis: Muscle weakness (generalized) (M62.81);History of falling (Z91.81);Pain                Time: 1511-1535 OT Time Calculation (min): 24 min Charges:  OT General Charges $OT Visit: 1 Procedure OT Evaluation $OT Eval Moderate Complexity: 1 Procedure G-Codes:     Olegario Messier, MS, OTR/L   Olegario Messier, MS, OTR/L 06/19/2017, 4:07 PM

## 2017-06-19 NOTE — Progress Notes (Signed)
Patient transferred back to bed from chair. Aquacell dressing in place on left hip with minimal drainage. No Complaints at this time, grandson at bedside. Continue to monitor.

## 2017-06-19 NOTE — Care Management Note (Signed)
Case Management Note  Patient Details  Name: Christina RenshawRuth J [name] MRN: [id number] Date of Birth: 1920/05/21  Subjective/Objective:  RNCM consult for discharge planning. PT recommending SNF. CSW following. Will sign off.                   Action/Plan:   Expected Discharge Date:  06/21/17               Expected Discharge Plan:  Skilled Nursing Facility  In-House Referral:  Clinical Social Work  Discharge planning Services  CM Consult  Post Acute Care Choice:    Choice offered to:     DME Arranged:    DME Agency:     HH Arranged:    HH Agency:     Status of Service:  Completed, signed off  If discussed at [employer] Meetings, dates discussed:    Additional Comments:  Marily MemosLisa M Ashling Roane, RN 06/19/2017, 10:57 AM

## 2017-06-19 NOTE — Clinical Social Work Placement (Signed)
   CLINICAL SOCIAL WORK PLACEMENT  NOTE  Date:  06/19/2017  Patient Details  Name: Christina [name] MRN: [id number] Date of Birth: 16-Jun-1920  Clinical Social Work is seeking post-discharge placement for this patient at the Skilled  Nursing Facility level of care (*CSW will initial, date and re-position this form in  chart as items are completed):  Yes   Patient/family provided with [facility] Clinical Social Work Department's list of facilities offering this level of care within the geographic area requested by the patient (or if unable, by the patient's family).  Yes   Patient/family informed of their freedom to choose among providers that offer the needed level of care, that participate in Medicare, Medicaid or managed care program needed by the patient, have an available bed and are willing to accept the patient.  Yes   Patient/family informed of [facility]'s ownership interest in [facility] and [facility], as well as of the fact that they are under no obligation to receive care at these facilities.  PASRR submitted to EDS on       PASRR number received on       Existing PASRR number confirmed on 06/19/17     FL2 transmitted to all facilities in geographic area requested by pt/family on 06/19/17     FL2 transmitted to all facilities within larger geographic area on       Patient informed that his/her managed care company has contracts with or will negotiate with certain facilities, including the following:        Yes   Patient/family informed of bed offers received.  Patient chooses bed at  [facility]     Physician recommends and patient chooses bed at      Patient to be transferred to   on  .  Patient to be transferred to facility by       Patient family notified on   of transfer.  Name of family member notified:        PHYSICIAN       Additional Comment:    _______________________________________________ [name], [name], LCSW 06/19/2017, 12:19  PM

## 2017-06-19 NOTE — Progress Notes (Signed)
Pt is alert, calm and pleasant and has no s/s of distress. VS and orders assessed and report called to Maureen RalphsVivian, RN via telephone. RN [employer]ell RubbermaidHouse Supervisor has transferred pt to the floor at this time. Will continue to monitor and tx pt according to MD orders.

## 2017-06-20 DIAGNOSIS — M1990 Unspecified osteoarthritis, unspecified site: Secondary | ICD-10-CM | POA: Diagnosis not present

## 2017-06-20 DIAGNOSIS — K5900 Constipation, unspecified: Secondary | ICD-10-CM | POA: Diagnosis not present

## 2017-06-20 DIAGNOSIS — M84459A Pathological fracture, hip, unspecified, initial encounter for fracture: Secondary | ICD-10-CM | POA: Diagnosis not present

## 2017-06-20 DIAGNOSIS — D649 Anemia, unspecified: Secondary | ICD-10-CM | POA: Diagnosis not present

## 2017-06-20 DIAGNOSIS — R262 Difficulty in walking, not elsewhere classified: Secondary | ICD-10-CM | POA: Diagnosis not present

## 2017-06-20 DIAGNOSIS — Z7982 Long term (current) use of aspirin: Secondary | ICD-10-CM | POA: Diagnosis not present

## 2017-06-20 DIAGNOSIS — R634 Abnormal weight loss: Secondary | ICD-10-CM | POA: Diagnosis not present

## 2017-06-20 DIAGNOSIS — M8000XD Age-related osteoporosis with current pathological fracture, unspecified site, subsequent encounter for fracture with routine healing: Secondary | ICD-10-CM | POA: Diagnosis not present

## 2017-06-20 DIAGNOSIS — M6281 Muscle weakness (generalized): Secondary | ICD-10-CM | POA: Diagnosis not present

## 2017-06-20 DIAGNOSIS — I739 Peripheral vascular disease, unspecified: Secondary | ICD-10-CM | POA: Diagnosis not present

## 2017-06-20 DIAGNOSIS — E559 Vitamin D deficiency, unspecified: Secondary | ICD-10-CM | POA: Diagnosis not present

## 2017-06-20 DIAGNOSIS — I25119 Atherosclerotic heart disease of native coronary artery with unspecified angina pectoris: Secondary | ICD-10-CM | POA: Diagnosis not present

## 2017-06-20 DIAGNOSIS — S72002D Fracture of unspecified part of neck of left femur, subsequent encounter for closed fracture with routine healing: Secondary | ICD-10-CM | POA: Diagnosis not present

## 2017-06-20 DIAGNOSIS — S72142A Displaced intertrochanteric fracture of left femur, initial encounter for closed fracture: Secondary | ICD-10-CM | POA: Diagnosis not present

## 2017-06-20 DIAGNOSIS — R112 Nausea with vomiting, unspecified: Secondary | ICD-10-CM | POA: Diagnosis not present

## 2017-06-20 DIAGNOSIS — I25118 Atherosclerotic heart disease of native coronary artery with other forms of angina pectoris: Secondary | ICD-10-CM | POA: Diagnosis not present

## 2017-06-20 DIAGNOSIS — G629 Polyneuropathy, unspecified: Secondary | ICD-10-CM | POA: Diagnosis not present

## 2017-06-20 DIAGNOSIS — W19XXXA Unspecified fall, initial encounter: Secondary | ICD-10-CM | POA: Diagnosis not present

## 2017-06-20 DIAGNOSIS — E876 Hypokalemia: Secondary | ICD-10-CM | POA: Diagnosis not present

## 2017-06-20 DIAGNOSIS — G9340 Encephalopathy, unspecified: Secondary | ICD-10-CM | POA: Diagnosis not present

## 2017-06-20 DIAGNOSIS — Z9181 History of falling: Secondary | ICD-10-CM | POA: Diagnosis not present

## 2017-06-20 DIAGNOSIS — I2510 Atherosclerotic heart disease of native coronary artery without angina pectoris: Secondary | ICD-10-CM | POA: Diagnosis not present

## 2017-06-20 DIAGNOSIS — I080 Rheumatic disorders of both mitral and aortic valves: Secondary | ICD-10-CM | POA: Diagnosis not present

## 2017-06-20 DIAGNOSIS — A0472 Enterocolitis due to Clostridium difficile, not specified as recurrent: Secondary | ICD-10-CM | POA: Diagnosis not present

## 2017-06-20 DIAGNOSIS — I10 Essential (primary) hypertension: Secondary | ICD-10-CM | POA: Diagnosis not present

## 2017-06-20 DIAGNOSIS — S72002A Fracture of unspecified part of neck of left femur, initial encounter for closed fracture: Secondary | ICD-10-CM | POA: Diagnosis not present

## 2017-06-20 DIAGNOSIS — Z01812 Encounter for preprocedural laboratory examination: Secondary | ICD-10-CM | POA: Diagnosis not present

## 2017-06-20 DIAGNOSIS — E538 Deficiency of other specified B group vitamins: Secondary | ICD-10-CM | POA: Diagnosis not present

## 2017-06-20 DIAGNOSIS — Z7401 Bed confinement status: Secondary | ICD-10-CM | POA: Diagnosis not present

## 2017-06-20 DIAGNOSIS — E785 Hyperlipidemia, unspecified: Secondary | ICD-10-CM | POA: Diagnosis not present

## 2017-06-20 DIAGNOSIS — M549 Dorsalgia, unspecified: Secondary | ICD-10-CM | POA: Diagnosis not present

## 2017-06-20 DIAGNOSIS — M80052D Age-related osteoporosis with current pathological fracture, left femur, subsequent encounter for fracture with routine healing: Secondary | ICD-10-CM | POA: Diagnosis not present

## 2017-06-20 DIAGNOSIS — R197 Diarrhea, unspecified: Secondary | ICD-10-CM | POA: Diagnosis not present

## 2017-06-20 DIAGNOSIS — S70222A Blister (nonthermal), left hip, initial encounter: Secondary | ICD-10-CM | POA: Diagnosis not present

## 2017-06-20 DIAGNOSIS — M545 Low back pain: Secondary | ICD-10-CM | POA: Diagnosis not present

## 2017-06-20 LAB — BASIC METABOLIC PANEL
ANION GAP: 6 (ref 5–15)
BUN: 12 mg/dL (ref 6–20)
CALCIUM: 8.7 mg/dL — AB (ref 8.9–10.3)
CO2: 27 mmol/L (ref 22–32)
CREATININE: 0.86 mg/dL (ref 0.44–1.00)
Chloride: 107 mmol/L (ref 101–111)
GFR, EST NON AFRICAN AMERICAN: 56 mL/min — AB (ref >60)
Glucose, Bld: 127 mg/dL — ABNORMAL HIGH (ref 65–99)
Potassium: 3.4 mmol/L — ABNORMAL LOW (ref 3.5–5.1)
SODIUM: 140 mmol/L (ref 135–145)

## 2017-06-20 LAB — CBC
HEMATOCRIT: 29.2 % — AB (ref 35.0–47.0)
Hemoglobin: 9.5 g/dL — ABNORMAL LOW (ref 12.0–16.0)
MCH: 31 pg (ref 26.0–34.0)
MCHC: 32.7 g/dL (ref 32.0–36.0)
MCV: 94.8 fL (ref 80.0–100.0)
PLATELETS: 176 10*3/uL (ref 150–440)
RBC: 3.08 MIL/uL — ABNORMAL LOW (ref 3.80–5.20)
RDW: 14.1 % (ref 11.5–14.5)
WBC: 12 10*3/uL — AB (ref 3.6–11.0)

## 2017-06-20 MED ORDER — POTASSIUM CHLORIDE 20 MEQ PO PACK
40.0000 meq | PACK | Freq: Once | ORAL | Status: AC
  Administered 2017-06-20: 40 meq via ORAL
  Filled 2017-06-20: qty 2

## 2017-06-20 NOTE — Progress Notes (Signed)
Report called to [facility]. Pt had BM this shift. No reports of pain at this time. VS stable. PIV removed. Belongings packed up and EMS called for transport.   Suzan SlickAlison L Karenna Romanoff, RN

## 2017-06-20 NOTE — Progress Notes (Signed)
Patient is medically stable for [facility]. [facility] today to room 204-A. RN will call report at [phone] and arrange EMS for transport. Clinical Child psychotherapistocial Worker (CSW) sent [facility] orders [facility] via [employer]. Patient is aware of above. Patient's daughter Rubbie BattiestJean Allred is at bedside and aware of above. Please reconsult if future social work needs arise. CSW signing off.   [employer] Hughes IncorporatedBailey Stark [place], LCSW [phone]

## 2017-06-20 NOTE — Progress Notes (Signed)
Physical Therapy Treatment Patient Details Name: Christina RenshawRuth J [name] MRN: [id number] DOB: Oct 12, 1920 Today's Date: 06/20/2017    History of Present Illness Pt. is a 83 y.o. female who [facility] for IM Nailing repair of a Left Hip Fracture sustained during a fall in her garden. Pt. PMHx incldes: Right Hip Fracture approximately one year ago, CAD, Stents, Chronic Back Pain, HTN, HLD, OA, Osteoporosis, Neuropathy.    PT Comments    Participated in exercises as described below.   Pt with overall improved mobility with less fear of movement today.  Was able to transfer to chair with +1 min/mod assist.  Mobility limited by pain, weakness.  Pt's family asking if they could transport her in private car to SNF.  Educated on mobility status and limitations.  SNF remains appropriate discharge plan at this time.   Follow Up Recommendations  SNF     Equipment Recommendations       Recommendations for Other Services       Precautions / Restrictions Precautions Precautions: Fall Restrictions Weight Bearing Restrictions: Yes LLE Weight Bearing: Partial weight bearing    Mobility  Bed Mobility Overal bed mobility: Needs Assistance Bed Mobility: Supine to Sit     Supine to sit: Mod assist;HOB elevated     General bed mobility comments: Gerneral improvement in activity tolerance today.    Transfers Overall transfer level: Needs assistance Equipment used: Rolling walker (2 wheeled) Transfers: Sit to/from Stand Sit to Stand: Mod assist         General transfer comment: Able to stand x 2 at bedside and step in place x 10.  she was able to transfer to recliner at bedside.  Ambulation/Gait Ambulation/Gait assistance: Min assist;Mod assist Ambulation Distance (Feet): 3 Feet Assistive device: Rolling walker (2 wheeled) Gait Pattern/deviations: Step-to pattern         Stairs            Wheelchair Mobility    Modified Rankin (Stroke Patients Only)       Balance  Overall balance assessment: Needs assistance;History of Falls Sitting-balance support: Feet supported Sitting balance-Leahy Scale: Fair     Standing balance support: Bilateral upper extremity supported Standing balance-Leahy Scale: Fair                              Cognition Arousal/Alertness: Awake/alert Behavior During Therapy: WFL for tasks assessed/performed Overall Cognitive Status: Within Functional Limits for tasks assessed                                        Exercises Other Exercises Other Exercises: Supine ther-ex x 10 B included: ankle pumps, glute squeezes, SLR's (RLE), shoulder flexion, and elbow flexion/ext.     General Comments        Pertinent Vitals/Pain Pain Assessment: 0-10 Pain Score: 5  Pain Location: Left Hip Fracture Pain Descriptors / Indicators: Aching Pain Intervention(s): Monitored during session    Home Living                      Prior Function            PT Goals (current goals can now be found in the care plan section) Progress towards PT goals: Progressing toward goals    Frequency    BID      PT Plan Current  plan remains appropriate    Co-evaluation              AM-PAC PT "6 Clicks" Daily Activity  Outcome Measure  Difficulty turning over in bed (including adjusting bedclothes, sheets and blankets)?: Total Difficulty moving from lying on back to sitting on the side of the bed? : Total Difficulty sitting down on and standing up from a chair with arms (e.g., wheelchair, bedside commode, etc,.)?: Total Help needed moving to and from a bed to chair (including a wheelchair)?: Total Help needed walking in hospital room?: A Lot Help needed climbing 3-5 steps with a railing? : Total 6 Click Score: 7    End of Session Equipment Utilized During Treatment: Gait belt Activity Tolerance: Patient tolerated treatment well Patient left: in chair;with chair alarm set;with call bell/phone  within reach Nurse Communication: Mobility status Pain - Right/Left: Left Pain - part of body: Hip     Time: 1140-1157 PT Time Calculation (min) (ACUTE ONLY): 17 min  Charges:  $Therapeutic Exercise: 8-22 mins                    G Codes:       Danielle Dess, PTA 06/20/17, 1:38 PM

## 2017-06-20 NOTE — Progress Notes (Signed)
Subjective:  Postoperative day #2 status post intramedullary fixation for left intertrochanteric hip fracture. Patient reports left hip pain as mild at rest.  Her family is at the bedside. Patient is up out of bed to a chair. She denies any shortness of breath or chest pain.   Objective:   VITALS:   Vitals:   06/19/17 1122 06/19/17 1647 06/19/17 2131 06/20/17 0712  BP: (!) 130/44 (!) 148/59 (!) 144/51 (!) 154/49  Pulse: 73 79 83 89  Resp: 18  18 18   Temp: 97.8 F (36.6 C) 98.7 F (37.1 C) 98.6 F (37 C) 98.9 F (37.2 C)  TempSrc: Oral Oral Oral Oral  SpO2: 98% 94% 97% 93%  Weight:      Height:        PHYSICAL EXAM:  Left lower extremity: Neurovascular intact Sensation intact distally Intact pulses distally Dorsiflexion/Plantar flexion intact Incision: scant drainage No cellulitis present Compartment soft  LABS  Results for orders placed or performed during the hospital encounter of 06/18/17 (from the past 24 hour(s))  CBC     Status: Abnormal   Collection Time: 06/20/17  3:25 AM  Result Value Ref Range   WBC 12.0 (H) 3.6 - 11.0 K/uL   RBC 3.08 (L) 3.80 - 5.20 MIL/uL   Hemoglobin 9.5 (L) 12.0 - 16.0 g/dL   HCT 11.929.2 (L) 14.735.0 - 82.947.0 %   MCV 94.8 80.0 - 100.0 fL   MCH 31.0 26.0 - 34.0 pg   MCHC 32.7 32.0 - 36.0 g/dL   RDW 56.214.1 13.011.5 - 86.514.5 %   Platelets 176 150 - 440 K/uL  Basic metabolic panel     Status: Abnormal   Collection Time: 06/20/17  3:25 AM  Result Value Ref Range   Sodium 140 135 - 145 mmol/L   Potassium 3.4 (L) 3.5 - 5.1 mmol/L   Chloride 107 101 - 111 mmol/L   CO2 27 22 - 32 mmol/L   Glucose, Bld 127 (H) 65 - 99 mg/dL   BUN 12 6 - 20 mg/dL   Creatinine, Ser 7.840.86 0.44 - 1.00 mg/dL   Calcium 8.7 (L) 8.9 - 10.3 mg/dL   GFR calc non Af Amer 56 (L) >60 mL/min   GFR calc Af Amer >60 >60 mL/min   Anion gap 6 5 - 15    Dg C-arm 1-60 Min  Result Date: 06/18/2017 CLINICAL DATA:  Internal fixation of left hip fracture. Initial encounter. EXAM:  OPERATIVE LEFT HIP (WITH PELVIS IF PERFORMED) 2 VIEWS TECHNIQUE: Fluoroscopic spot image(s) were submitted for interpretation post-operatively. COMPARISON:  Left hip radiographs performed earlier today at 2:36 p.m. FINDINGS: Five fluoroscopic C-arm images are provided from the OR, demonstrating placement of a dynamic left hip screw, transfixing the patient's left femoral intertrochanteric fracture in near anatomic alignment. No new fractures are seen. Overlying postoperative soft tissue air is noted. IMPRESSION: Status post internal fixation of left femoral intertrochanteric fracture in near anatomic alignment. Electronically Signed   By: Roanna RaiderJeffery  Chang M.D.   On: 06/18/2017 23:10   Dg Hip Operative Unilat With Pelvis Left  Result Date: 06/18/2017 CLINICAL DATA:  Internal fixation of left hip fracture. Initial encounter. EXAM: OPERATIVE LEFT HIP (WITH PELVIS IF PERFORMED) 2 VIEWS TECHNIQUE: Fluoroscopic spot image(s) were submitted for interpretation post-operatively. COMPARISON:  Left hip radiographs performed earlier today at 2:36 p.m. FINDINGS: Five fluoroscopic C-arm images are provided from the OR, demonstrating placement of a dynamic left hip screw, transfixing the patient's left femoral intertrochanteric fracture in near anatomic  alignment. No new fractures are seen. Overlying postoperative soft tissue air is noted. IMPRESSION: Status post internal fixation of left femoral intertrochanteric fracture in near anatomic alignment. Electronically Signed   By: Roanna Raider M.D.   On: 06/18/2017 23:10   Dg Hip Unilat With Pelvis 2-3 Views Left  Result Date: 06/18/2017 CLINICAL DATA:  Status post fall after working in the garden sepsis afternoon with left hip pain. EXAM: DG HIP (WITH OR WITHOUT PELVIS) 2-3V LEFT COMPARISON:  June 22nd 2016 FINDINGS: There is comminuted displaced fracture of the intertrochanteric left femur. Patient status post prior fixation of the right femur without malalignment.  IMPRESSION: Comminuted displaced fracture of the intertrochanteric left femur. Electronically Signed   By: Sherian Rein M.D.   On: 06/18/2017 14:59    Assessment/Plan: 2 Days Post-Op   Active Problems:   Hip fracture (HCC)  Patient is stable postop.  Her hemoglobin and hematocrit remained stable.  I've ordered to replete her potassium which is 3.4 today before discharge. Patient is been discharged to rehabilitation by the medical service. She is doing well from orthopedic standpoint. She will follow up with Dr. Hyacinth Meeker in our office in 7-10 days.   [name] , MD 06/20/2017, 12:13 PM

## 2017-06-20 NOTE — Clinical Social Work Placement (Signed)
   CLINICAL SOCIAL WORK PLACEMENT  NOTE  Date:  06/20/2017  Patient Details  Name: Christina [name] MRN: [id number] Date of Birth: October 04, 1920  Clinical Social Work is seeking post-discharge placement for this patient at the Skilled  Nursing Facility level of care (*CSW will initial, date and re-position this form in  chart as items are completed):  Yes   Patient/family provided with [facility] Clinical Social Work Department's list of facilities offering this level of care within the geographic area requested by the patient (or if unable, by the patient's family).  Yes   Patient/family informed of their freedom to choose among providers that offer the needed level of care, that participate in Medicare, Medicaid or managed care program needed by the patient, have an available bed and are willing to accept the patient.  Yes   Patient/family informed of [facility] ownership interest in [facility] and [facility], as well as of the fact that they are under no obligation to receive care at these facilities.  PASRR submitted to EDS on       PASRR number received on       Existing PASRR number confirmed on 06/19/17     FL2 transmitted to all facilities in geographic area requested by pt/family on 06/19/17     FL2 transmitted to all facilities within larger geographic area on       Patient informed that his/her managed care company has contracts with or will negotiate with certain facilities, including the following:        Yes   Patient/family informed of bed offers received.  Patient [facility] bed at  [facility]     Physician recommends and patient chooses bed at      Patient to be transferred to  [facility] on 06/20/17.  Patient to be transferred to facility by  [facility]     Patient family notified on 06/20/17 of transfer.  Name of family member notified:   (Patient's daughter Christina [name] is at bedside and aware of D/C today. )     PHYSICIAN     Additional Comment:    _______________________________________________ Torunn Chancellor, [name], LCSW 06/20/2017, 11:46 AM

## 2017-06-21 DIAGNOSIS — I25118 Atherosclerotic heart disease of native coronary artery with other forms of angina pectoris: Secondary | ICD-10-CM | POA: Diagnosis not present

## 2017-06-21 DIAGNOSIS — G9340 Encephalopathy, unspecified: Secondary | ICD-10-CM | POA: Diagnosis not present

## 2017-06-21 DIAGNOSIS — I10 Essential (primary) hypertension: Secondary | ICD-10-CM | POA: Diagnosis not present

## 2017-06-21 DIAGNOSIS — M8000XD Age-related osteoporosis with current pathological fracture, unspecified site, subsequent encounter for fracture with routine healing: Secondary | ICD-10-CM | POA: Diagnosis not present

## 2017-06-22 ENCOUNTER — Other Ambulatory Visit
Admission: RE | Admit: 2017-06-22 | Discharge: 2017-06-22 | Disposition: A | Discharge status: Home / Self Care | Payer: Medicare Other | Source: Skilled Nursing Facility | Attending: Gerontology | Admitting: Gerontology

## 2017-06-22 DIAGNOSIS — D649 Anemia, unspecified: Secondary | ICD-10-CM | POA: Insufficient documentation

## 2017-06-22 LAB — CBC WITH DIFFERENTIAL/PLATELET
BASOS PCT: 0 %
Basophils Absolute: 0 10*3/uL (ref 0–0.1)
Eosinophils Absolute: 0.3 10*3/uL (ref 0–0.7)
Eosinophils Relative: 3 %
HEMATOCRIT: 27.1 % — AB (ref 35.0–47.0)
HEMOGLOBIN: 9.2 g/dL — AB (ref 12.0–16.0)
LYMPHS PCT: 7 %
Lymphs Abs: 0.7 10*3/uL — ABNORMAL LOW (ref 1.0–3.6)
MCH: 32.5 pg (ref 26.0–34.0)
MCHC: 33.8 g/dL (ref 32.0–36.0)
MCV: 96 fL (ref 80.0–100.0)
MONOS PCT: 7 %
Monocytes Absolute: 0.7 10*3/uL (ref 0.2–0.9)
NEUTROS ABS: 8.2 10*3/uL — AB (ref 1.4–6.5)
NEUTROS PCT: 83 %
Platelets: 223 10*3/uL (ref 150–440)
RBC: 2.83 MIL/uL — ABNORMAL LOW (ref 3.80–5.20)
RDW: 13.8 % (ref 11.5–14.5)
WBC: 10 10*3/uL (ref 3.6–11.0)

## 2017-06-22 LAB — COMPREHENSIVE METABOLIC PANEL
ALK PHOS: 43 U/L (ref 38–126)
ALT: 9 U/L — ABNORMAL LOW (ref 14–54)
ANION GAP: 4 — AB (ref 5–15)
AST: 17 U/L (ref 15–41)
Albumin: 2.6 g/dL — ABNORMAL LOW (ref 3.5–5.0)
BILIRUBIN TOTAL: 0.8 mg/dL (ref 0.3–1.2)
BUN: 20 mg/dL (ref 6–20)
CALCIUM: 8.8 mg/dL — AB (ref 8.9–10.3)
CO2: 29 mmol/L (ref 22–32)
Chloride: 105 mmol/L (ref 101–111)
Creatinine, Ser: 0.74 mg/dL (ref 0.44–1.00)
GFR calc Af Amer: >60 mL/min (ref >60)
GFR calc non Af Amer: >60 mL/min (ref >60)
Glucose, Bld: 143 mg/dL — ABNORMAL HIGH (ref 65–99)
Potassium: 3.8 mmol/L (ref 3.5–5.1)
Sodium: 138 mmol/L (ref 135–145)
TOTAL PROTEIN: 6 g/dL — AB (ref 6.5–8.1)

## 2017-06-27 ENCOUNTER — Non-Acute Institutional Stay (SKILLED_NURSING_FACILITY): Payer: Medicare Other | Admitting: Gerontology

## 2017-06-27 DIAGNOSIS — K5900 Constipation, unspecified: Secondary | ICD-10-CM

## 2017-06-27 DIAGNOSIS — S72002D Fracture of unspecified part of neck of left femur, subsequent encounter for closed fracture with routine healing: Secondary | ICD-10-CM | POA: Diagnosis not present

## 2017-06-28 ENCOUNTER — Encounter: Payer: Self-pay | Admitting: Gerontology

## 2017-06-28 NOTE — Progress Notes (Signed)
Location:   [facility] Room Number: [phone] Place of Service:  SNF [phone]) Provider:  Lorenso Quarry, NP-C  Karie Schwalbe, MD  Patient Care Team: Karie Schwalbe, MD as PCP - General Iran Ouch, MD as Consulting Physician (Cardiology)  Extended Emergency Contact Information Primary Emergency Contact: Zoila Shutter [place] Home Phone: [phone] Relation: Daughter  Code Status:  DNR Goals of care: Advanced Directive information Advanced Directives 06/27/2017  Does Patient Have a Medical Advance Directive? Yes  Type of Advance Directive Out of facility DNR (pink MOST or yellow form)  Does patient want to make changes to medical advance directive? No - Patient declined  Copy of Healthcare Power of Attorney in Chart? -  Would patient like information on creating a medical advance directive? -     Chief Complaint  Patient presents with  . Medical Management of Chronic Issues    Routine Visit    HPI:  Pt is a 83 y.o. female seen today for follow up. Pt was admitted to the facility for rehab following hospitalization for Left Hip Fracture with IM nailing. Pt has been participating in PT and OT. She is doing well. Pt reports her pain is controlled on current regimen. She reports her appetite is good but is not having regular BM's. Pt denies n/v/d/f/c/cp/sob/ha/abd pain/dizziness/cough. VSS. No other complaints.      Past Medical History:  Diagnosis Date  . CAD (coronary artery disease)    a. S/P previous MI;  b. 04/2007 Cath/PCI: Taxus DES' to mid/distal RCA and RPDA as well as Ramus;  c. myoview 8/09: EF 78%, normal perfusion  . Carotid stenosis    Bilateral, mild to moderate  . Chronic back pain   . Chronic shoulder pain   . Hyperlipidemia    Intolerant of many statins  . Hypertension   . Mild mitral and aortic regurgitation    a. 11/2011 Echo: EF 55-65%, No RWMA, Gr 1 DD, Mild AI/MR.  Marland Kitchen Neuropathy   . Osteoarthritis     . Osteoporosis   . Pelvic fracture (HCC) 6/16  . Peripheral vascular disease [facility])    Past Surgical History:  Procedure Laterality Date  . ADENOSINE MYOVIEW  01/2006   No ischemia, EF 70%  . BREAST BIOPSY     Bilateral  . CAROTID U/S  10/2006   0-39% bilaterally  . CATARACT EXTRACTION  09/2006   Right  . CORONARY ANGIOPLASTY  12/2002   Medical rx only  . CORONARY ANGIOPLASTY  01/2006   LAD/PAD disease  . CORONARY ANGIOPLASTY  03/2007   3 vessel disease  . CORONARY STENT PLACEMENT  2002   MI, [facility]  . CORONARY STENT PLACEMENT  04/2007   RCA/LCX multiple, [facility]  . CP ADMIT  09/2005   Stress myoview negative  . FEMORAL HERNIA REPAIR  05/2007   Incarcerated, right  . FRACTURE SURGERY  03/2009   Left leg  . INTRAMEDULLARY (IM) NAIL INTERTROCHANTERIC Right 05/18/2016   Procedure: INTRAMEDULLARY (IM) NAIL INTERTROCHANTRIC;  Surgeon: [name], MD;  Location: [facility] ORS;  Service: Orthopedics;  Laterality: Right;  . INTRAMEDULLARY (IM) NAIL INTERTROCHANTERIC Left 06/18/2017   Procedure: INTRAMEDULLARY (IM) NAIL INTERTROCHANTRIC;  Surgeon: Deeann Saint, MD;  Location: [facility] ORS;  Service: Orthopedics;  Laterality: Left;  . NM MYOVIEW LTD  11/2004   Stress, negative; NL EF  . VESICOVAGINAL FISTULA CLOSURE W/ TAH      Allergies  Allergen Reactions  . Atorvastatin Other (See  Comments)    Reaction:  Blisters on feet   . Diltiazem Hcl Other (See Comments)    Reaction:  Unknown   . Dye Fdc Red [Red Dye] Other (See Comments)    Reaction:  Unknown   . Gemfibrozil Other (See Comments)    Reaction:  Unknown   . Niacin Other (See Comments)    Reaction:  Unknown   . Sulfasalazine Rash    Allergies as of 06/27/2017      Reactions   Atorvastatin Other (See Comments)   Reaction:  Blisters on feet    Diltiazem Hcl Other (See Comments)   Reaction:  Unknown    Dye Fdc Red [red Dye] Other (See Comments)   Reaction:  Unknown    Gemfibrozil Other (See Comments)   Reaction:  Unknown     Niacin Other (See Comments)   Reaction:  Unknown    Sulfasalazine Rash      Medication List       Accurate as of 06/27/17 11:59 PM. Always use your most recent med list.          aspirin EC 81 MG tablet Take 81 mg by mouth daily.   bisacodyl 10 MG suppository Commonly known as:  DULCOLAX Place 1 suppository (10 mg total) rectally daily as needed for moderate constipation.   carvedilol 3.125 MG tablet Commonly known as:  COREG TAKE 1 TABLET BY MOUTH TWO  TIMES DAILY   CEPACOL SORE THROAT 15-3.6 MG Lozg Generic drug:  Benzocaine-Menthol Use as directed 1 lozenge in the mouth or throat as needed.   enoxaparin 30 MG/0.3ML injection Commonly known as:  LOVENOX Inject 0.3 mLs (30 mg total) into the skin daily.   ENSURE ENLIVE PO Take 1 Bottle by mouth 2 (two) times daily between meals.   feeding supplement (PRO-STAT SUGAR FREE 64) Liqd Take 30 mLs by mouth 2 (two) times daily between meals.   isosorbide mononitrate 30 MG 24 hr tablet Commonly known as:  IMDUR TAKE 1 TABLET BY MOUTH  DAILY   lisinopril 10 MG tablet Commonly known as:  PRINIVIL,ZESTRIL TAKE 1 TABLET BY MOUTH  DAILY   meclizine 25 MG tablet Commonly known as:  ANTIVERT Take 1 tablet (25 mg total) by mouth 3 (three) times daily as needed for dizziness.   MEGARED OMEGA-3 KRILL OIL 500 MG Caps Take 1 capsule by mouth daily.   MULTIVITAL tablet Take 1 tablet by mouth daily.   nitroGLYCERIN 0.4 MG SL tablet Commonly known as:  NITROSTAT Place 1 tablet (0.4 mg total) under the tongue every 5 (five) minutes as needed for chest pain.   oxyCODONE 5 MG immediate release tablet Commonly known as:  Oxy IR/ROXICODONE Take 1-2 tablets (5-10 mg total) by mouth every 4 (four) hours as needed for breakthrough pain ((for MODERATE breakthrough pain)).   rosuvastatin 5 MG tablet Commonly known as:  CRESTOR TAKE 1 TABLET BY MOUTH  DAILY   senna 8.6 MG Tabs tablet Commonly known as:  SENOKOT Take 1 tablet  (8.6 mg total) by mouth 2 (two) times daily.   TYLENOL 8 HOUR 650 MG CR tablet Generic drug:  acetaminophen Take 650 mg by mouth every 8 (eight) hours as needed for pain. Do not exceed 3000 mg / 24 hrs of acetaminophen. Consider all sources       Review of Systems  Constitutional: Negative for activity change, appetite change, chills, diaphoresis and fever.  HENT: Negative for congestion, sneezing, sore throat, trouble swallowing and voice change.  Respiratory: Negative for apnea, cough, choking, chest tightness, shortness of breath and wheezing.   Cardiovascular: Negative for chest pain, palpitations and leg swelling.  Gastrointestinal: Positive for constipation. Negative for abdominal distention, abdominal pain, diarrhea and nausea.  Genitourinary: Negative for difficulty urinating, dysuria, frequency and urgency.  Musculoskeletal: Positive for arthralgias (typical arthritis) and gait problem. Negative for back pain and myalgias.  Skin: Positive for wound. Negative for color change, pallor and rash.  Neurological: Negative for dizziness, tremors, syncope, speech difficulty, weakness, numbness and headaches.  Psychiatric/Behavioral: Negative for agitation and behavioral problems.  All other systems reviewed and are negative.   Immunization History  Administered Date(s) Administered  . Influenza Whole 11/05/2007, 09/15/2009, 09/15/2010  . Influenza,inj,Quad PF,36+ Mos 08/26/2013, 09/04/2014, 09/06/2015, 11/13/2016  . Pneumococcal Conjugate-13 09/04/2014  . Pneumococcal Polysaccharide-23 12/18/1986  . Td 12/18/1993, 08/26/2013   Pertinent  Health Maintenance Due  Topic Date Due  . PNA vac Low Risk Adult (2 of 2 - PPSV23) 09/05/2015  . INFLUENZA VACCINE  07/18/2017  . DEXA SCAN  Completed   Fall Risk  11/13/2016 09/06/2015 09/04/2014  Falls in the past year? Yes Yes Yes  Number falls in past yr: 1 2 or more 2 or more  Injury with Fall? Yes Yes -  Risk Factor Category  - High  Fall Risk High Fall Risk  Risk for fall due to : - History of fall(s);Impaired balance/gait;Impaired mobility History of fall(s);Impaired balance/gait;Impaired mobility  Follow up Education provided Falls prevention discussed;Falls evaluation completed -   Functional Status Survey:    Vitals:   06/27/17 1023  BP: (!) 127/48  Pulse: 73  Resp: 18  Temp: 98.2 F (36.8 C)  SpO2: 97%  Weight: 109 lb 11.2 oz (49.8 kg)  Height: 5' (1.524 m)   Body mass index is 21.42 kg/m. Physical Exam  Constitutional: She is oriented to person, place, and time. Vital signs are normal. She appears well-developed and well-nourished. She is active and cooperative. She does not appear ill. No distress.  HENT:  Head: Normocephalic and atraumatic.  Mouth/Throat: Uvula is midline, oropharynx is clear and moist and mucous membranes are normal. Mucous membranes are not pale, not dry and not cyanotic.  Eyes: Pupils are equal, round, and reactive to light. Conjunctivae, EOM and lids are normal.  Neck: Trachea normal, normal range of motion and full passive range of motion without pain. Neck supple. No JVD present. No tracheal deviation, no edema and no erythema present. No thyromegaly present.  Cardiovascular: Normal rate, regular rhythm, normal heart sounds, intact distal pulses and normal pulses.  Exam reveals no gallop, no distant heart sounds and no friction rub.   No murmur heard. Pulses:      Dorsalis pedis pulses are 2+ on the right side, and 2+ on the left side.  No edema  Pulmonary/Chest: Effort normal and breath sounds normal. No accessory muscle usage. No respiratory distress. She has no decreased breath sounds. She has no wheezes. She has no rhonchi. She has no rales. She exhibits no tenderness.  Abdominal: Normal appearance and bowel sounds are normal. She exhibits no distension and no ascites. There is no tenderness.  Musculoskeletal: She exhibits no edema.       Left hip: She exhibits decreased  range of motion, decreased strength, tenderness and laceration (lincision).  Expected osteoarthritis, stiffness. Calves soft, supple. Negative Homan's sign  Neurological: She is alert and oriented to person, place, and time. She has normal strength.  Skin: Skin is warm and dry.  Laceration (left hip incision) noted. No rash noted. She is not diaphoretic. No cyanosis or erythema. No pallor. Nails show no clubbing.  Psychiatric: She has a normal mood and affect. Her speech is normal and behavior is normal. Judgment and thought content normal. Cognition and memory are normal.  Nursing note and vitals reviewed.   Labs reviewed:  Recent Labs  06/19/17 0717 06/20/17 0325 06/22/17 1020  NA 138 140 138  K 3.8 3.4* 3.8  CL 108 107 105  CO2 25 27 29   GLUCOSE 160* 127* 143*  BUN 16 12 20   CREATININE 0.88 0.86 0.74  CALCIUM 8.2* 8.7* 8.8*    Recent Labs  11/13/16 1210 06/18/17 1401 06/22/17 1020  AST 15 17 17   ALT 12 13* 9*  ALKPHOS 64 49 43  BILITOT 0.3 0.7 0.8  PROT 7.1 7.0 6.0*  ALBUMIN 4.0 3.8 2.6*    Recent Labs  11/13/16 1210  06/19/17 0717 06/20/17 0325 06/22/17 1020  WBC 8.0  < > 13.0* 12.0* 10.0  NEUTROABS 5.5  --   --   --  8.2*  HGB 12.2  < > 10.2* 9.5* 9.2*  HCT 37.7  < > 30.9* 29.2* 27.1*  MCV 95.2  < > 94.4 94.8 96.0  PLT 213.0  < > 190 176 223  < > = values in this interval not displayed. Lab Results  Component Value Date   TSH 1.63 08/26/2013   No results found for: HGBA1C Lab Results  Component Value Date   CHOL 142 11/13/2016   HDL 46.40 11/13/2016   LDLCALC 62 11/13/2016   LDLDIRECT 77.8 01/28/2010   TRIG 167.0 (H) 11/13/2016   CHOLHDL 3 11/13/2016    Significant Diagnostic Results in last 30 days:  Dg C-arm 1-60 Min  Result Date: 06/18/2017 CLINICAL DATA:  Internal fixation of left hip fracture. Initial encounter. EXAM: OPERATIVE LEFT HIP (WITH PELVIS IF PERFORMED) 2 VIEWS TECHNIQUE: Fluoroscopic spot image(s) were submitted for  interpretation post-operatively. COMPARISON:  Left hip radiographs performed earlier today at 2:36 p.m. FINDINGS: Five fluoroscopic C-arm images are provided from the OR, demonstrating placement of a dynamic left hip screw, transfixing the patient's left femoral intertrochanteric fracture in near anatomic alignment. No new fractures are seen. Overlying postoperative soft tissue air is noted. IMPRESSION: Status post internal fixation of left femoral intertrochanteric fracture in near anatomic alignment. Electronically Signed   By: Roanna Raider M.D.   On: 06/18/2017 23:10   Dg Hip Operative Unilat With Pelvis Left  Result Date: 06/18/2017 CLINICAL DATA:  Internal fixation of left hip fracture. Initial encounter. EXAM: OPERATIVE LEFT HIP (WITH PELVIS IF PERFORMED) 2 VIEWS TECHNIQUE: Fluoroscopic spot image(s) were submitted for interpretation post-operatively. COMPARISON:  Left hip radiographs performed earlier today at 2:36 p.m. FINDINGS: Five fluoroscopic C-arm images are provided from the OR, demonstrating placement of a dynamic left hip screw, transfixing the patient's left femoral intertrochanteric fracture in near anatomic alignment. No new fractures are seen. Overlying postoperative soft tissue air is noted. IMPRESSION: Status post internal fixation of left femoral intertrochanteric fracture in near anatomic alignment. Electronically Signed   By: Roanna Raider M.D.   On: 06/18/2017 23:10   Dg Hip Unilat With Pelvis 2-3 Views Left  Result Date: 06/18/2017 CLINICAL DATA:  Status post fall after working in the garden sepsis afternoon with left hip pain. EXAM: DG HIP (WITH OR WITHOUT PELVIS) 2-3V LEFT COMPARISON:  June 22nd 2016 FINDINGS: There is comminuted displaced fracture of the intertrochanteric left femur. Patient status  post prior fixation of the right femur without malalignment. IMPRESSION: Comminuted displaced fracture of the intertrochanteric left femur. Electronically Signed   By: Sherian Rein  M.D.   On: 06/18/2017 14:59    Assessment/Plan 1. Closed fracture of left hip with routine healing, subsequent encounter  Continue PT/OT  Continue exercises as taught by PT/OT  Ice to the hip prn  Dressing changes/ wound care per protocol  Continue Lovenox 30 mg SQ Q Day  Continue Oxycodone 5 mg 1-2 tablets PO Q 4 hours prn  Continue Tylenol 650 mg ER po Q 8 hours prn  2. Constipation  Senna S- 2 tablets PO BID  Increase po fluid intake  Family/ staff Communication:   Total Time:  Documentation:  Face to Face:  Family/Phone:   Labs/tests ordered:    Medication list reviewed and assessed for continued appropriateness. Monthly medication orders reviewed and signed.  Brynda Rim, NP-C Geriatrics [facility] [phone] [address], [place] [address] Cell Phone (Mon-Fri 8am-5pm):  [phone] On Call:  [phone] & follow prompts after 5pm & weekends Office Phone:  [phone] Office Fax:  [phone]

## 2017-06-29 DIAGNOSIS — S72142A Displaced intertrochanteric fracture of left femur, initial encounter for closed fracture: Secondary | ICD-10-CM | POA: Diagnosis not present

## 2017-07-02 ENCOUNTER — Ambulatory Visit: Payer: Medicare Other | Admitting: Cardiovascular Disease

## 2017-07-05 ENCOUNTER — Non-Acute Institutional Stay (SKILLED_NURSING_FACILITY): Payer: Medicare Other | Admitting: Gerontology

## 2017-07-05 ENCOUNTER — Encounter: Payer: Self-pay | Admitting: Gerontology

## 2017-07-05 DIAGNOSIS — S72002D Fracture of unspecified part of neck of left femur, subsequent encounter for closed fracture with routine healing: Secondary | ICD-10-CM

## 2017-07-05 DIAGNOSIS — S70222A Blister (nonthermal), left hip, initial encounter: Secondary | ICD-10-CM | POA: Diagnosis not present

## 2017-07-05 DIAGNOSIS — K5900 Constipation, unspecified: Secondary | ICD-10-CM | POA: Diagnosis not present

## 2017-07-05 NOTE — Progress Notes (Signed)
Location:   [facility] Room Number: [phone] Place of Service:  SNF [phone]) Provider:  Lorenso Quarry, NP-C  Karie Schwalbe, MD  Patient Care Team: Karie Schwalbe, MD as PCP - General Iran Ouch, MD as Consulting Physician (Cardiology)  Extended Emergency Contact Information Primary Emergency Contact: Zoila Shutter [place] Home Phone: [phone] Relation: Daughter  Code Status:  DNR Goals of care: Advanced Directive information Advanced Directives 07/05/2017  Does Patient Have a Medical Advance Directive? Yes  Type of Advance Directive Out of facility DNR (pink MOST or yellow form)  Does patient want to make changes to medical advance directive? No - Patient declined  Copy of Healthcare Power of Attorney in Chart? -  Would patient like information on creating a medical advance directive? -     Chief Complaint  Patient presents with  . Medical Management of Chronic Issues    Routine Visit    HPI:  Pt is a 83 y.o. female seen today for follow up. Pt was admitted to the facility for rehab following hospitalization for Left Hip Fracture with IM nailing. Pt has been participating in PT and OT. She is doing well. Pt reports her pain is controlled on current regimen. She reports her appetite is good but is not having regular BM's. Pt denies n/v/d/f/c/cp/sob/ha/abd pain/dizziness/cough. She does have a small serous filled blister on the lateral thigh near the incision. Appears to be an adhesive blister. No redness, no warmth, nontender. VSS. No other complaints.      Past Medical History:  Diagnosis Date  . CAD (coronary artery disease)    a. S/P previous MI;  b. 04/2007 Cath/PCI: Taxus DES' to mid/distal RCA and RPDA as well as Ramus;  c. myoview 8/09: EF 78%, normal perfusion  . Carotid stenosis    Bilateral, mild to moderate  . Chronic back pain   . Chronic shoulder pain   . Hyperlipidemia    Intolerant of many statins  .  Hypertension   . Mild mitral and aortic regurgitation    a. 11/2011 Echo: EF 55-65%, No RWMA, Gr 1 DD, Mild AI/MR.  Marland Kitchen Neuropathy   . Osteoarthritis   . Osteoporosis   . Pelvic fracture (HCC) 6/16  . Peripheral vascular disease [facility])    Past Surgical History:  Procedure Laterality Date  . ADENOSINE MYOVIEW  01/2006   No ischemia, EF 70%  . BREAST BIOPSY     Bilateral  . CAROTID U/S  10/2006   0-39% bilaterally  . CATARACT EXTRACTION  09/2006   Right  . CORONARY ANGIOPLASTY  12/2002   Medical rx only  . CORONARY ANGIOPLASTY  01/2006   LAD/PAD disease  . CORONARY ANGIOPLASTY  03/2007   3 vessel disease  . CORONARY STENT PLACEMENT  2002   MI, [facility]  . CORONARY STENT PLACEMENT  04/2007   RCA/LCX multiple, [facility]  . CP ADMIT  09/2005   Stress myoview negative  . FEMORAL HERNIA REPAIR  05/2007   Incarcerated, right  . FRACTURE SURGERY  03/2009   Left leg  . INTRAMEDULLARY (IM) NAIL INTERTROCHANTERIC Right 05/18/2016   Procedure: INTRAMEDULLARY (IM) NAIL INTERTROCHANTRIC;  Surgeon: [name], MD;  Location: [facility] ORS;  Service: Orthopedics;  Laterality: Right;  . INTRAMEDULLARY (IM) NAIL INTERTROCHANTERIC Left 06/18/2017   Procedure: INTRAMEDULLARY (IM) NAIL INTERTROCHANTRIC;  Surgeon: Deeann Saint, MD;  Location: [facility] ORS;  Service: Orthopedics;  Laterality: Left;  . NM MYOVIEW LTD  11/2004  Stress, negative; NL EF  . VESICOVAGINAL FISTULA CLOSURE W/ TAH      Allergies  Allergen Reactions  . Atorvastatin Other (See Comments)    Reaction:  Blisters on feet   . Diltiazem Hcl Other (See Comments)    Reaction:  Unknown   . Dye Fdc Red [Red Dye] Other (See Comments)    Reaction:  Unknown   . Gemfibrozil Other (See Comments)    Reaction:  Unknown   . Niacin Other (See Comments)    Reaction:  Unknown   . Sulfasalazine Rash    Allergies as of 07/05/2017      Reactions   Atorvastatin Other (See Comments)   Reaction:  Blisters on feet    Diltiazem Hcl Other (See  Comments)   Reaction:  Unknown    Dye Fdc Red [red Dye] Other (See Comments)   Reaction:  Unknown    Gemfibrozil Other (See Comments)   Reaction:  Unknown    Niacin Other (See Comments)   Reaction:  Unknown    Sulfasalazine Rash      Medication List       Accurate as of 07/05/17  9:50 AM. Always use your most recent med list.          aspirin EC 81 MG tablet Take 81 mg by mouth daily.   bisacodyl 10 MG suppository Commonly known as:  DULCOLAX Place 1 suppository (10 mg total) rectally daily as needed for moderate constipation.   carvedilol 3.125 MG tablet Commonly known as:  COREG TAKE 1 TABLET BY MOUTH TWO  TIMES DAILY   CEPACOL SORE THROAT 15-3.6 MG Lozg Generic drug:  Benzocaine-Menthol Use as directed 1 lozenge in the mouth or throat as needed.   enoxaparin 30 MG/0.3ML injection Commonly known as:  LOVENOX Inject 0.3 mLs (30 mg total) into the skin daily.   ENSURE ENLIVE PO Take 1 Bottle by mouth 2 (two) times daily between meals.   feeding supplement (PRO-STAT SUGAR FREE 64) Liqd Take 30 mLs by mouth 2 (two) times daily between meals.   isosorbide mononitrate 30 MG 24 hr tablet Commonly known as:  IMDUR TAKE 1 TABLET BY MOUTH  DAILY   lisinopril 10 MG tablet Commonly known as:  PRINIVIL,ZESTRIL TAKE 1 TABLET BY MOUTH  DAILY   meclizine 25 MG tablet Commonly known as:  ANTIVERT Take 1 tablet (25 mg total) by mouth 3 (three) times daily as needed for dizziness.   MEGARED OMEGA-3 KRILL OIL 500 MG Caps Take 1 capsule by mouth daily.   MULTIVITAL tablet Take 1 tablet by mouth daily.   nitroGLYCERIN 0.4 MG SL tablet Commonly known as:  NITROSTAT Place 1 tablet (0.4 mg total) under the tongue every 5 (five) minutes as needed for chest pain.   oxyCODONE 5 MG immediate release tablet Commonly known as:  Oxy IR/ROXICODONE Take 1-2 tablets (5-10 mg total) by mouth every 4 (four) hours as needed for breakthrough pain ((for MODERATE breakthrough pain)).     rosuvastatin 5 MG tablet Commonly known as:  CRESTOR TAKE 1 TABLET BY MOUTH  DAILY   senna 8.6 MG Tabs tablet Commonly known as:  SENOKOT Take 1 tablet (8.6 mg total) by mouth 2 (two) times daily.   TYLENOL 8 HOUR 650 MG CR tablet Generic drug:  acetaminophen Take 650 mg by mouth every 8 (eight) hours as needed for pain. Do not exceed 3000 mg / 24 hrs of acetaminophen. Consider all sources       Review of  Systems  Constitutional: Negative for activity change, appetite change, chills, diaphoresis and fever.  HENT: Negative for congestion, sneezing, sore throat, trouble swallowing and voice change.   Respiratory: Negative for apnea, cough, choking, chest tightness, shortness of breath and wheezing.   Cardiovascular: Negative for chest pain, palpitations and leg swelling.  Gastrointestinal: Positive for constipation. Negative for abdominal distention, abdominal pain, diarrhea and nausea.  Genitourinary: Negative for difficulty urinating, dysuria, frequency and urgency.  Musculoskeletal: Positive for arthralgias (typical arthritis) and gait problem. Negative for back pain and myalgias.  Skin: Positive for wound. Negative for color change, pallor and rash.  Neurological: Negative for dizziness, tremors, syncope, speech difficulty, weakness, numbness and headaches.  Psychiatric/Behavioral: Negative for agitation and behavioral problems.  All other systems reviewed and are negative.   Immunization History  Administered Date(s) Administered  . Influenza Whole 11/05/2007, 09/15/2009, 09/15/2010  . Influenza,inj,Quad PF,36+ Mos 08/26/2013, 09/04/2014, 09/06/2015, 11/13/2016  . Pneumococcal Conjugate-13 09/04/2014  . Pneumococcal Polysaccharide-23 12/18/1986  . Td 12/18/1993, 08/26/2013   Pertinent  Health Maintenance Due  Topic Date Due  . PNA vac Low Risk Adult (2 of 2 - PPSV23) 09/05/2015  . INFLUENZA VACCINE  07/18/2017  . DEXA SCAN  Completed   Fall Risk  11/13/2016 09/06/2015  09/04/2014  Falls in the past year? Yes Yes Yes  Number falls in past yr: 1 2 or more 2 or more  Injury with Fall? Yes Yes -  Risk Factor Category  - High Fall Risk High Fall Risk  Risk for fall due to : - History of fall(s);Impaired balance/gait;Impaired mobility History of fall(s);Impaired balance/gait;Impaired mobility  Follow up Education provided Falls prevention discussed;Falls evaluation completed -   Functional Status Survey:    Vitals:   07/05/17 0941  BP: (!) 141/48  Pulse: 62  Resp: 18  Temp: (!) 97 F (36.1 C)  SpO2: 100%  Weight: 107 lb 8 oz (48.8 kg)  Height: 5' (1.524 m)   Body mass index is 20.99 kg/m. Physical Exam  Constitutional: She is oriented to person, place, and time. Vital signs are normal. She appears well-developed and well-nourished. She is active and cooperative. She does not appear ill. No distress.  HENT:  Head: Normocephalic and atraumatic.  Mouth/Throat: Uvula is midline, oropharynx is clear and moist and mucous membranes are normal. Mucous membranes are not pale, not dry and not cyanotic.  Eyes: Pupils are equal, round, and reactive to light. Conjunctivae, EOM and lids are normal.  Neck: Trachea normal, normal range of motion and full passive range of motion without pain. Neck supple. No JVD present. No tracheal deviation, no edema and no erythema present. No thyromegaly present.  Cardiovascular: Normal rate, regular rhythm, normal heart sounds, intact distal pulses and normal pulses.  Exam reveals no gallop, no distant heart sounds and no friction rub.   No murmur heard. Pulses:      Dorsalis pedis pulses are 2+ on the right side, and 2+ on the left side.  No edema  Pulmonary/Chest: Effort normal and breath sounds normal. No accessory muscle usage. No respiratory distress. She has no decreased breath sounds. She has no wheezes. She has no rhonchi. She has no rales. She exhibits no tenderness.  Abdominal: Normal appearance and bowel sounds are  normal. She exhibits no distension and no ascites. There is no tenderness.  Musculoskeletal: She exhibits no edema.       Left hip: She exhibits decreased range of motion, decreased strength, tenderness and laceration (lincision).  Expected osteoarthritis, stiffness.  Calves soft, supple. Negative Homan's sign  Neurological: She is alert and oriented to person, place, and time. She has normal strength.  Skin: Skin is warm and dry. Laceration (left hip incision) noted. She is not diaphoretic. No cyanosis or erythema. No pallor. Nails show no clubbing.     Psychiatric: She has a normal mood and affect. Her speech is normal and behavior is normal. Judgment and thought content normal. Cognition and memory are normal.  Nursing note and vitals reviewed.   Labs reviewed:  Recent Labs  06/19/17 0717 06/20/17 0325 06/22/17 1020  NA 138 140 138  K 3.8 3.4* 3.8  CL 108 107 105  CO2 25 27 29   GLUCOSE 160* 127* 143*  BUN 16 12 20   CREATININE 0.88 0.86 0.74  CALCIUM 8.2* 8.7* 8.8*    Recent Labs  11/13/16 1210 06/18/17 1401 06/22/17 1020  AST 15 17 17   ALT 12 13* 9*  ALKPHOS 64 49 43  BILITOT 0.3 0.7 0.8  PROT 7.1 7.0 6.0*  ALBUMIN 4.0 3.8 2.6*    Recent Labs  11/13/16 1210  06/19/17 0717 06/20/17 0325 06/22/17 1020  WBC 8.0  < > 13.0* 12.0* 10.0  NEUTROABS 5.5  --   --   --  8.2*  HGB 12.2  < > 10.2* 9.5* 9.2*  HCT 37.7  < > 30.9* 29.2* 27.1*  MCV 95.2  < > 94.4 94.8 96.0  PLT 213.0  < > 190 176 223  < > = values in this interval not displayed. Lab Results  Component Value Date   TSH 1.63 08/26/2013   No results found for: HGBA1C Lab Results  Component Value Date   CHOL 142 11/13/2016   HDL 46.40 11/13/2016   LDLCALC 62 11/13/2016   LDLDIRECT 77.8 01/28/2010   TRIG 167.0 (H) 11/13/2016   CHOLHDL 3 11/13/2016    Significant Diagnostic Results in last 30 days:  Dg C-arm 1-60 Min  Result Date: 06/18/2017 CLINICAL DATA:  Internal fixation of left hip fracture.  Initial encounter. EXAM: OPERATIVE LEFT HIP (WITH PELVIS IF PERFORMED) 2 VIEWS TECHNIQUE: Fluoroscopic spot image(s) were submitted for interpretation post-operatively. COMPARISON:  Left hip radiographs performed earlier today at 2:36 p.m. FINDINGS: Five fluoroscopic C-arm images are provided from the OR, demonstrating placement of a dynamic left hip screw, transfixing the patient's left femoral intertrochanteric fracture in near anatomic alignment. No new fractures are seen. Overlying postoperative soft tissue air is noted. IMPRESSION: Status post internal fixation of left femoral intertrochanteric fracture in near anatomic alignment. Electronically Signed   By: Roanna Raider M.D.   On: 06/18/2017 23:10   Dg Hip Operative Unilat With Pelvis Left  Result Date: 06/18/2017 CLINICAL DATA:  Internal fixation of left hip fracture. Initial encounter. EXAM: OPERATIVE LEFT HIP (WITH PELVIS IF PERFORMED) 2 VIEWS TECHNIQUE: Fluoroscopic spot image(s) were submitted for interpretation post-operatively. COMPARISON:  Left hip radiographs performed earlier today at 2:36 p.m. FINDINGS: Five fluoroscopic C-arm images are provided from the OR, demonstrating placement of a dynamic left hip screw, transfixing the patient's left femoral intertrochanteric fracture in near anatomic alignment. No new fractures are seen. Overlying postoperative soft tissue air is noted. IMPRESSION: Status post internal fixation of left femoral intertrochanteric fracture in near anatomic alignment. Electronically Signed   By: Roanna Raider M.D.   On: 06/18/2017 23:10   Dg Hip Unilat With Pelvis 2-3 Views Left  Result Date: 06/18/2017 CLINICAL DATA:  Status post fall after working in the garden sepsis afternoon with left  hip pain. EXAM: DG HIP (WITH OR WITHOUT PELVIS) 2-3V LEFT COMPARISON:  June 22nd 2016 FINDINGS: There is comminuted displaced fracture of the intertrochanteric left femur. Patient status post prior fixation of the right femur without  malalignment. IMPRESSION: Comminuted displaced fracture of the intertrochanteric left femur. Electronically Signed   By: Sherian Rein M.D.   On: 06/18/2017 14:59    Assessment/Plan 1. Closed fracture of left hip with routine healing, subsequent encounter  Continue PT/OT  Continue exercises as taught by PT/OT  Ice to the hip prn  Dressing changes/ wound care per protocol  Continue Lovenox 30 mg SQ Q Day  Continue Oxycodone 5 mg 1-2 tablets PO Q 4 hours prn  Continue Tylenol 650 mg ER po Q 8 hours prn  2. Constipation  Senna S- 2 tablets PO BID  Increase po fluid intake  3. Blister of hip, left, initial encounter  Cover with Allevyn dressing, change Q 5 days  Family/ staff Communication:   Total Time:  Documentation:  Face to Face:  Family/Phone:   Labs/tests ordered:    Medication list reviewed and assessed for continued appropriateness. Monthly medication orders reviewed and signed.  Brynda Rim, NP-C Geriatrics [facility] [phone], [place] [address] Cell Phone (Mon-Fri 8am-5pm):  [phone] On Call:  [phone] & follow prompts after 5pm & weekends Office Phone:  [phone] Office Fax:  [phone]

## 2017-07-11 ENCOUNTER — Telehealth: Payer: Self-pay

## 2017-07-11 NOTE — Telephone Encounter (Signed)
Christina [name] (DPR signed) said [facility] after fx hip. When pt was admitted on 06/20/17 wt was 112.8 and on 07/10/17 pt weighed 96.6 lbs. Christina BernJean is concerned about wt loss and pt is not eating a lot and when pt does eat she has diarrhea. Pt has had  diarrhea for over one week(there is some form to stool). Pt has had nausea also. No fever. Christina BernJean understands Christina [name] is out of office but Christina BernJean [facility] call [facility] and advise what to do for pt about diarrhea. Christina BernJean said she seldom sees a Engineer, civil (consulting)nurse and has not spoken to the nurses about this problem and Christina [name] has only seen pt one time on 06/21/17 after pts admission. Pt is to be discharged sometime this week. Christina BernJean request cb.

## 2017-07-11 NOTE — Telephone Encounter (Signed)
If pt has a doctor following her during rehab admission, this needs to go through them as if any orders are needed, they need to order them. Pt may need stool studies to find cause of diarrhea.  Looks like she may be taking laxative twice daily (from latest nursing home note)?

## 2017-07-11 NOTE — Telephone Encounter (Signed)
Spoke to Al[place] and advised per Dr GReece Agar

## 2017-07-12 ENCOUNTER — Other Ambulatory Visit
Admission: RE | Admit: 2017-07-12 | Discharge: 2017-07-12 | Disposition: A | Discharge status: Home / Self Care | Payer: Medicare Other | Source: Ambulatory Visit | Attending: Gerontology | Admitting: Gerontology

## 2017-07-12 ENCOUNTER — Other Ambulatory Visit: Payer: Self-pay

## 2017-07-12 ENCOUNTER — Non-Acute Institutional Stay (SKILLED_NURSING_FACILITY): Payer: Medicare Other | Admitting: Gerontology

## 2017-07-12 DIAGNOSIS — E559 Vitamin D deficiency, unspecified: Secondary | ICD-10-CM | POA: Diagnosis not present

## 2017-07-12 DIAGNOSIS — E538 Deficiency of other specified B group vitamins: Secondary | ICD-10-CM | POA: Diagnosis not present

## 2017-07-12 DIAGNOSIS — R112 Nausea with vomiting, unspecified: Secondary | ICD-10-CM | POA: Insufficient documentation

## 2017-07-12 DIAGNOSIS — A0472 Enterocolitis due to Clostridium difficile, not specified as recurrent: Secondary | ICD-10-CM

## 2017-07-12 DIAGNOSIS — R634 Abnormal weight loss: Secondary | ICD-10-CM | POA: Insufficient documentation

## 2017-07-12 DIAGNOSIS — R197 Diarrhea, unspecified: Secondary | ICD-10-CM | POA: Insufficient documentation

## 2017-07-12 LAB — CBC WITH DIFFERENTIAL/PLATELET
Basophils Absolute: 0 10*3/uL (ref 0–0.1)
Basophils Relative: 1 %
Eosinophils Absolute: 0.3 10*3/uL (ref 0–0.7)
Eosinophils Relative: 5 %
HEMATOCRIT: 27.8 % — AB (ref 35.0–47.0)
HEMOGLOBIN: 9.3 g/dL — AB (ref 12.0–16.0)
LYMPHS ABS: 1.4 10*3/uL (ref 1.0–3.6)
LYMPHS PCT: 23 %
MCH: 31.7 pg (ref 26.0–34.0)
MCHC: 33.5 g/dL (ref 32.0–36.0)
MCV: 94.6 fL (ref 80.0–100.0)
Monocytes Absolute: 0.6 10*3/uL (ref 0.2–0.9)
Monocytes Relative: 10 %
NEUTROS ABS: 3.6 10*3/uL (ref 1.4–6.5)
Neutrophils Relative %: 61 %
Platelets: 271 10*3/uL (ref 150–440)
RBC: 2.94 MIL/uL — AB (ref 3.80–5.20)
RDW: 14.3 % (ref 11.5–14.5)
WBC: 5.8 10*3/uL (ref 3.6–11.0)

## 2017-07-12 LAB — LIPID PANEL
Cholesterol: 111 mg/dL (ref 0–200)
HDL: 35 mg/dL — ABNORMAL LOW (ref >40)
LDL CALC: 54 mg/dL (ref 0–99)
TRIGLYCERIDES: 112 mg/dL (ref <150)
Total CHOL/HDL Ratio: 3.2 RATIO
VLDL: 22 mg/dL (ref 0–40)

## 2017-07-12 LAB — COMPREHENSIVE METABOLIC PANEL
ALK PHOS: 113 U/L (ref 38–126)
ALT: 9 U/L — AB (ref 14–54)
AST: 13 U/L — ABNORMAL LOW (ref 15–41)
Albumin: 2.9 g/dL — ABNORMAL LOW (ref 3.5–5.0)
Anion gap: 6 (ref 5–15)
BUN: 19 mg/dL (ref 6–20)
CALCIUM: 9.4 mg/dL (ref 8.9–10.3)
CO2: 27 mmol/L (ref 22–32)
CREATININE: 0.7 mg/dL (ref 0.44–1.00)
Chloride: 109 mmol/L (ref 101–111)
Glucose, Bld: 86 mg/dL (ref 65–99)
Potassium: 3.1 mmol/L — ABNORMAL LOW (ref 3.5–5.1)
SODIUM: 142 mmol/L (ref 135–145)
Total Bilirubin: 0.6 mg/dL (ref 0.3–1.2)
Total Protein: 6.1 g/dL — ABNORMAL LOW (ref 6.5–8.1)

## 2017-07-12 LAB — C DIFFICILE QUICK SCREEN W PCR REFLEX
C DIFFICLE (CDIFF) ANTIGEN: POSITIVE — AB
C Diff toxin: NEGATIVE

## 2017-07-12 LAB — MAGNESIUM: MAGNESIUM: 2.1 mg/dL (ref 1.7–2.4)

## 2017-07-12 LAB — VITAMIN B12: VITAMIN B 12: 399 pg/mL (ref 180–914)

## 2017-07-12 LAB — TSH: TSH: 1.482 u[IU]/mL (ref 0.350–4.500)

## 2017-07-12 LAB — CLOSTRIDIUM DIFFICILE BY PCR: Toxigenic C. Difficile by PCR: POSITIVE — AB

## 2017-07-12 MED ORDER — OXYCODONE HCL 5 MG PO TABS: 5.0000 mg | ORAL_TABLET | ORAL | 0 refills | Status: DC | PRN

## 2017-07-12 NOTE — Telephone Encounter (Signed)
Rx sent to [facility] phone : [phone] , fax : [phone]  

## 2017-07-13 ENCOUNTER — Encounter: Payer: Self-pay | Admitting: Gerontology

## 2017-07-13 LAB — VITAMIN D 25 HYDROXY (VIT D DEFICIENCY, FRACTURES): Vit D, 25-Hydroxy: 29.7 ng/mL — ABNORMAL LOW (ref 30.0–100.0)

## 2017-07-13 NOTE — Progress Notes (Signed)
Location:   [facility] Room Number: [phone] Place of Service:  [phone]) Provider:  Toni Arthurs, NP-C  Venia Carbon, MD  Patient Care Team: Venia Carbon, MD as PCP - General Wellington Hampshire, MD as Consulting Physician (Cardiology)  Extended Emergency Contact Information Primary Emergency Contact: Cristy Friedlander [place] of [place] Phone: [phone] Work Phone: [phone] Mobile Phone: [phone] Relation: Daughter Secondary Emergency Contact: Lorelee New [place] Phone: [phone] Relation: Son  Code Status:  DNR Goals of care: Advanced Directive information Advanced Directives 07/13/2017  Does Patient Have a Medical Advance Directive? Yes  Type of Advance Directive Out of facility DNR (pink MOST or yellow form)  Does patient want to make changes to medical advance directive? No - Patient declined  Copy of [place] in Chart? -  Would patient like information on creating a medical advance directive? -     Chief Complaint  Patient presents with  . Acute Visit    Follow up on weight loss    HPI:  Pt is a 83 y.o. female seen today for an acute visit for weight loss. Pt also c/o N/V/D. She then says she has not actually vomited but has felt nauseated and unable to eat d/t the nausea. When asked, she reports the diarrhea. Says she has had it for several days, but has not told anyone about it. When asked to describe the diarrhea, pt endorses it is yellowish, very soft- but not liquid, and very foul smelling. Pt reports she has had 3 episodes of diarrhea this morning. No episodes of incontinence. Pt denies fever, chills, headache, dizziness, cough, congestion. Does endorse some abdominal pain/ cramping and weakness. VSS. No other complaints.     Past Medical History:  Diagnosis Date  . CAD (coronary artery disease)    a. S/P previous MI;  b. 04/2007 Cath/PCI: Taxus DES' to mid/distal  RCA and RPDA as well as Ramus;  c. myoview 8/09: EF 78%, normal perfusion  . Carotid stenosis    Bilateral, mild to moderate  . Chronic back pain   . Chronic shoulder pain   . Hyperlipidemia    Intolerant of many statins  . Hypertension   . Mild mitral and aortic regurgitation    a. 11/2011 Echo: EF 55-65%, No RWMA, Gr 1 DD, Mild AI/MR.  Marland Kitchen Neuropathy   . Osteoarthritis   . Osteoporosis   . Pelvic fracture ([place]) 6/16  . Peripheral vascular disease [facility])    Past Surgical History:  Procedure Laterality Date  . ADENOSINE MYOVIEW  01/2006   No ischemia, EF 70%  . BREAST BIOPSY     Bilateral  . CAROTID U/S  10/2006   0-39% bilaterally  . CATARACT EXTRACTION  09/2006   Right  . CORONARY ANGIOPLASTY  12/2002   Medical rx only  . CORONARY ANGIOPLASTY  01/2006   LAD/PAD disease  . CORONARY ANGIOPLASTY  03/2007   3 vessel disease  . CORONARY STENT PLACEMENT  2002   MI, [facility]  . CORONARY STENT PLACEMENT  04/2007   RCA/LCX multiple, [facility]  . CP ADMIT  09/2005   Stress myoview negative  . FEMORAL HERNIA REPAIR  05/2007   Incarcerated, right  . FRACTURE SURGERY  03/2009   Left leg  . INTRAMEDULLARY (IM) NAIL INTERTROCHANTERIC Right 05/18/2016   Procedure: INTRAMEDULLARY (IM) NAIL INTERTROCHANTRIC;  Surgeon: Thornton Park, MD;  Location: [facility] ORS;  Service: Orthopedics;  Laterality: Right;  .  INTRAMEDULLARY (IM) NAIL INTERTROCHANTERIC Left 06/18/2017   Procedure: INTRAMEDULLARY (IM) NAIL INTERTROCHANTRIC;  Surgeon: Earnestine Leys, MD;  Location: [facility] ORS;  Service: Orthopedics;  Laterality: Left;  . NM MYOVIEW LTD  11/2004   Stress, negative; NL EF  . VESICOVAGINAL FISTULA CLOSURE W/ TAH      Allergies  Allergen Reactions  . Atorvastatin Other (See Comments)    Reaction:  Blisters on feet   . Diltiazem Hcl Other (See Comments)    Reaction:  Unknown   . Dye Fdc Red [Red Dye] Other (See Comments)    Reaction:  Unknown   . Gemfibrozil Other (See Comments)    Reaction:  Unknown    . Niacin Other (See Comments)    Reaction:  Unknown   . Sulfasalazine Rash    Allergies as of 07/12/2017      Reactions   Atorvastatin Other (See Comments)   Reaction:  Blisters on feet    Diltiazem Hcl Other (See Comments)   Reaction:  Unknown    Dye Fdc Red [red Dye] Other (See Comments)   Reaction:  Unknown    Gemfibrozil Other (See Comments)   Reaction:  Unknown    Niacin Other (See Comments)   Reaction:  Unknown    Sulfasalazine Rash      Medication List       Accurate as of 07/12/17 11:59 PM. Always use your most recent med list.          aspirin EC 81 MG tablet Take 81 mg by mouth daily.   bisacodyl 10 MG suppository Commonly known as:  DULCOLAX Place 1 suppository (10 mg total) rectally daily as needed for moderate constipation.   carvedilol 3.125 MG tablet Commonly known as:  COREG TAKE 1 TABLET BY MOUTH TWO  TIMES DAILY   CEPACOL SORE THROAT 15-3.6 MG Lozg Generic drug:  Benzocaine-Menthol Use as directed 1 lozenge in the mouth or throat as needed.   cyanocobalamin 1000 MCG tablet Take 1,000 mcg by mouth daily.   feeding supplement (PRO-STAT SUGAR FREE 64) Liqd Take 30 mLs by mouth 2 (two) times daily between meals.   FIRST-VANCOMYCIN 50 50 MG/ML Soln Take 125 mg by mouth 4 (four) times daily.   isosorbide mononitrate 30 MG 24 hr tablet Commonly known as:  IMDUR TAKE 1 TABLET BY MOUTH  DAILY   lisinopril 10 MG tablet Commonly known as:  PRINIVIL,ZESTRIL TAKE 1 TABLET BY MOUTH  DAILY   meclizine 25 MG tablet Commonly known as:  ANTIVERT Take 1 tablet (25 mg total) by mouth 3 (three) times daily as needed for dizziness.   MEGARED OMEGA-3 KRILL OIL 500 MG Caps Take 1 capsule by mouth daily.   mirtazapine 7.5 MG tablet Commonly known as:  REMERON Take 7.5 mg by mouth at bedtime.   MULTIVITAL tablet Take 1 tablet by mouth daily.   nitroGLYCERIN 0.4 MG SL tablet Commonly known as:  NITROSTAT Place 1 tablet (0.4 mg total) under the tongue  every 5 (five) minutes as needed for chest pain.   ondansetron 8 MG tablet Commonly known as:  ZOFRAN Take 8 mg by mouth every 6 (six) hours as needed for nausea or vomiting.   oxyCODONE 5 MG immediate release tablet Commonly known as:  Oxy IR/ROXICODONE Take 1-2 tablets (5-10 mg total) by mouth every 4 (four) hours as needed for breakthrough pain ((for MODERATE breakthrough pain)).   RISA-BID PROBIOTIC Tabs Take 1 tablet by mouth 2 (two) times daily.   rosuvastatin 5 MG  tablet Commonly known as:  CRESTOR TAKE 1 TABLET BY MOUTH  DAILY   senna 8.6 MG Tabs tablet Commonly known as:  SENOKOT Take 1 tablet (8.6 mg total) by mouth 2 (two) times daily.   TYLENOL 8 HOUR 650 MG CR tablet Generic drug:  acetaminophen Take 650 mg by mouth every 8 (eight) hours as needed for pain. Do not exceed 3000 mg / 24 hrs of acetaminophen. Consider all sources       Review of Systems  Constitutional: Positive for appetite change and fatigue. Negative for activity change, chills, diaphoresis and fever.  HENT: Negative for congestion, sneezing, sore throat, trouble swallowing and voice change.   Respiratory: Negative for apnea, cough, choking, chest tightness, shortness of breath and wheezing.   Cardiovascular: Negative for chest pain, palpitations and leg swelling.  Gastrointestinal: Positive for diarrhea and nausea. Negative for abdominal distention, abdominal pain and constipation.  Genitourinary: Negative.  Negative for difficulty urinating, dysuria, frequency and urgency.  Musculoskeletal: Positive for arthralgias (typical arthritis) and gait problem. Negative for back pain and myalgias.  Skin: Positive for wound. Negative for color change, pallor and rash.  Neurological: Positive for weakness. Negative for dizziness, tremors, syncope, speech difficulty, numbness and headaches.  Psychiatric/Behavioral: Negative for agitation and behavioral problems.  All other systems reviewed and are  negative.   Immunization History  Administered Date(s) Administered  . Influenza Whole 11/05/2007, 09/15/2009, 09/15/2010  . Influenza,inj,Quad PF,36+ Mos 08/26/2013, 09/04/2014, 09/06/2015, 11/13/2016  . Pneumococcal Conjugate-13 09/04/2014  . Pneumococcal Polysaccharide-23 12/18/1986  . Td 12/18/1993, 08/26/2013   Pertinent  Health Maintenance Due  Topic Date Due  . PNA vac Low Risk Adult (2 of 2 - PPSV23) 09/05/2015  . INFLUENZA VACCINE  07/18/2017  . DEXA SCAN  Completed   Fall Risk  11/13/2016 09/06/2015 09/04/2014  Falls in the past year? Yes Yes Yes  Number falls in past yr: 1 2 or more 2 or more  Injury with Fall? Yes Yes -  Risk Factor Category  - High Fall Risk High Fall Risk  Risk for fall due to : - History of fall(s);Impaired balance/gait;Impaired mobility History of fall(s);Impaired balance/gait;Impaired mobility  Follow up Education provided Falls prevention discussed;Falls evaluation completed -   Functional Status Survey:    Vitals:   07/12/17 1414  BP: (!) 145/62  Pulse: 81  Resp: 18  Temp: 98.1 F (36.7 C)  SpO2: 98%  Weight: 96 lb 14.4 oz (44 kg)  Height: 5' (1.524 m)   Body mass index is 18.92 kg/m. Physical Exam  Constitutional: She is oriented to person, place, and time. Vital signs are normal. She appears well-developed and well-nourished. She is active and cooperative. She does not appear ill. No distress.  HENT:  Head: Normocephalic and atraumatic.  Mouth/Throat: Uvula is midline, oropharynx is clear and moist and mucous membranes are normal. Mucous membranes are not pale, not dry and not cyanotic.  Eyes: Pupils are equal, round, and reactive to light. Conjunctivae, EOM and lids are normal.  Neck: Trachea normal, normal range of motion and full passive range of motion without pain. Neck supple. No JVD present. No tracheal deviation, no edema and no erythema present. No thyromegaly present.  Cardiovascular: Normal rate, regular rhythm, normal  heart sounds, intact distal pulses and normal pulses.  Exam reveals no gallop, no distant heart sounds and no friction rub.   No murmur heard. Pulses:      Dorsalis pedis pulses are 2+ on the right side, and 2+ on the  left side.  No edema  Pulmonary/Chest: Effort normal and breath sounds normal. No accessory muscle usage. No respiratory distress. She has no decreased breath sounds. She has no wheezes. She has no rhonchi. She has no rales. She exhibits no tenderness.  Abdominal: Normal appearance and bowel sounds are normal. She exhibits no distension and no ascites. There is no tenderness.  Musculoskeletal: She exhibits no edema.       Left hip: She exhibits decreased range of motion, decreased strength, tenderness and laceration (lincision).  Expected osteoarthritis, stiffness. Calves soft, supple. Negative Homan's sign  Neurological: She is alert and oriented to person, place, and time. She has normal strength.  Skin: Skin is warm and dry. Laceration (left hip incision) noted. No rash noted. She is not diaphoretic. No cyanosis or erythema. No pallor. Nails show no clubbing.  Psychiatric: She has a normal mood and affect. Her speech is normal and behavior is normal. Judgment and thought content normal. Cognition and memory are normal.  Nursing note and vitals reviewed.   Labs reviewed:  Recent Labs  06/20/17 0325 06/22/17 1020 07/12/17 0700  NA 140 138 142  K 3.4* 3.8 3.1*  CL 107 105 109  CO2 _0 GLUCOSE 127* 143* 86  BUN _1 CREATININE 0.86 0.74 0.70  CALCIUM 8.7* 8.8* 9.4  MG  --   --  2.1    Recent Labs  06/18/17 1401 06/22/17 1020 07/12/17 0700  AST 17 17 13*  ALT 13* 9* 9*  ALKPHOS 49 43 113  BILITOT 0.7 0.8 0.6  PROT 7.0 6.0* 6.1*  ALBUMIN 3.8 2.6* 2.9*    Recent Labs  11/13/16 1210  06/20/17 0325 06/22/17 1020 07/12/17 0700  WBC 8.0  < > 12.0* 10.0 5.8  NEUTROABS 5.5  --   --  8.2* 3.6  HGB 12.2  < > 9.5* 9.2* 9.3*  HCT 37.7  < > 29.2*  27.1* 27.8*  MCV 95.2  < > 94.8 96.0 94.6  PLT 213.0  < > 176 223 271  < > = values in this interval not displayed. Lab Results  Component Value Date   TSH 1.482 07/12/2017   No results found for: HGBA1C Lab Results  Component Value Date   CHOL 111 07/12/2017   HDL 35 (L) 07/12/2017   LDLCALC 54 07/12/2017   LDLDIRECT 77.8 01/28/2010   TRIG 112 07/12/2017   CHOLHDL 3.2 07/12/2017    Significant Diagnostic Results in last 30 days:  Dg C-arm 1-60 Min  Result Date: 06/18/2017 CLINICAL DATA:  Internal fixation of left hip fracture. Initial encounter. EXAM: OPERATIVE LEFT HIP (WITH PELVIS IF PERFORMED) 2 VIEWS TECHNIQUE: Fluoroscopic spot image(s) were submitted for interpretation post-operatively. COMPARISON:  Left hip radiographs performed earlier today at 2:36 p.m. FINDINGS: Five fluoroscopic C-arm images are provided from the OR, demonstrating placement of a dynamic left hip screw, transfixing the patient's left femoral intertrochanteric fracture in near anatomic alignment. No new fractures are seen. Overlying postoperative soft tissue air is noted. IMPRESSION: Status post internal fixation of left femoral intertrochanteric fracture in near anatomic alignment. Electronically Signed   By: Garald Balding M.D.   On: 06/18/2017 23:10   Dg Hip Operative Unilat With Pelvis Left  Result Date: 06/18/2017 CLINICAL DATA:  Internal fixation of left hip fracture. Initial encounter. EXAM: OPERATIVE LEFT HIP (WITH PELVIS IF PERFORMED) 2 VIEWS TECHNIQUE: Fluoroscopic spot image(s) were submitted for interpretation post-operatively. COMPARISON:  Left hip radiographs performed earlier today at 2:36  p.m. FINDINGS: Five fluoroscopic C-arm images are provided from the OR, demonstrating placement of a dynamic left hip screw, transfixing the patient's left femoral intertrochanteric fracture in near anatomic alignment. No new fractures are seen. Overlying postoperative soft tissue air is noted. IMPRESSION: Status  post internal fixation of left femoral intertrochanteric fracture in near anatomic alignment. Electronically Signed   By: Garald Balding M.D.   On: 06/18/2017 23:10   Dg Hip Unilat With Pelvis 2-3 Views Left  Result Date: 06/18/2017 CLINICAL DATA:  Status post fall after working in the garden sepsis afternoon with left hip pain. EXAM: DG HIP (WITH OR WITHOUT PELVIS) 2-3V LEFT COMPARISON:  June 22nd 2016 FINDINGS: There is comminuted displaced fracture of the intertrochanteric left femur. Patient status post prior fixation of the right femur without malalignment. IMPRESSION: Comminuted displaced fracture of the intertrochanteric left femur. Electronically Signed   By: Abelardo Diesel M.D.   On: 06/18/2017 14:59    Assessment/Plan 1. Clostridium difficile colitis  Vancomycin 125 mg/ 5 mL- Take 5 mL po QID x 14 days  Risa-Bid 1 tablet po BID  Recheck stool sample for c-diff day after antibiotics complete  Enteric/Contact precautions. Pt is to remain on precautions until stool negative for cdiff  2. Weight loss  Mirtazapine 7.5 mg po Q HS  Continue Pro-Stat 30 mL po BID  Continue MVI po Q Day  Pt refusing Ensure  3. Vitamin D Deficiency  Cholecalciferol 2,000 units po Q Day  4. Vitamin B12 deficiency  Cyanocobalamin 1,000 units po Q Day  Family/ staff Communication:   Total Time:  Documentation:  Face to Face:  Family/Phone:   Labs/tests ordered:  Cbc, met c, lipid panel, Vitamin D, Vitamin B12, mag+, tsh, stool for c-diff  Medication list reviewed and assessed for continued appropriateness.  Vikki Ports, NP-C [facility]iatric[facility] [phone], [place] [address] Cell Phone (Mon-Fri 8am-5pm):  [phone] On Call:  [phone] & follow prompts after 5pm & weekends Office Phone:  [phone] Office Fax:  [phone]

## 2017-07-17 ENCOUNTER — Ambulatory Visit: Payer: Medicare Other | Admitting: Internal Medicine

## 2017-07-18 ENCOUNTER — Encounter
Admission: RE | Admit: 2017-07-18 | Discharge: 2017-07-18 | Disposition: A | Discharge status: Home / Self Care | Payer: Medicare Other | Source: Ambulatory Visit | Attending: Internal Medicine | Admitting: Internal Medicine

## 2017-07-18 ENCOUNTER — Encounter: Payer: Self-pay | Admitting: Gerontology

## 2017-07-18 ENCOUNTER — Non-Acute Institutional Stay (SKILLED_NURSING_FACILITY): Payer: Medicare Other | Admitting: Gerontology

## 2017-07-18 DIAGNOSIS — M545 Low back pain, unspecified: Secondary | ICD-10-CM

## 2017-07-18 DIAGNOSIS — W19XXXA Unspecified fall, initial encounter: Secondary | ICD-10-CM | POA: Diagnosis not present

## 2017-07-18 DIAGNOSIS — A0472 Enterocolitis due to Clostridium difficile, not specified as recurrent: Secondary | ICD-10-CM

## 2017-07-18 NOTE — Progress Notes (Signed)
Location:  [facility] Room Number: [phone] Place of Service:  SNF [id number]) Provider:  Lorenso Quarry, NP-C  Karie Schwalbe, MD  Patient Care Team: Karie Schwalbe, MD as PCP - General Iran Ouch, MD as Consulting Physician (Cardiology)  Extended Emergency Contact Information Primary Emergency Contact: Zoila Shutter [place] Home Phone: [phone] Work Phone: [phone] Mobile Phone: [phone] Relation: Daughter Secondary Emergency Contact: Marjie Skiff [place] Home Phone: [phone] Relation: Son  Code Status:  DNR Goals of care: Advanced Directive information Advanced Directives 07/18/2017  Does Patient Have a Medical Advance Directive? Yes  Type of Advance Directive Out of facility DNR (pink MOST or yellow form)  Does patient want to make changes to medical advance directive? No - Patient declined  Copy of Healthcare Power of Attorney in Chart? -  Would patient like information on creating a medical advance directive? -     Chief Complaint  Patient presents with  . Acute Visit    Follow up on back pain    HPI:  Pt is a 83 y.o. female seen today for an acute visit for back pain. Pt had gotten up during the night to go to the BR unassisted and fell onto her gluteus. She was initially telling staff she was fine and not hurting. She is now c/o pain in her lower back/ sacrum area. No bruising or discoloration seen. No crepitus or structural abnormalities felt. Pt reports h/o lower back pain. No urinary or fecal incontinence. Pt also reports the symptoms of C-diff have resolved. Pt denies n/v/d/f/c/cp/sob/ha/abd pain/dizziness/cough. VSS. No other complaints.    Past Medical History:  Diagnosis Date  . CAD (coronary artery disease)    a. S/P previous MI;  b. 04/2007 Cath/PCI: Taxus DES' to mid/distal RCA and RPDA as well as Ramus;  c. myoview 8/09: EF 78%, normal perfusion  . Carotid stenosis    Bilateral, mild to moderate  . Chronic back pain   . Chronic shoulder pain   . Hyperlipidemia    Intolerant of many statins  . Hypertension   . Mild mitral and aortic regurgitation    a. 11/2011 Echo: EF 55-65%, No RWMA, Gr 1 DD, Mild AI/MR.  Marland Kitchen Neuropathy   . Osteoarthritis   . Osteoporosis   . Pelvic fracture (HCC) 6/16  . Peripheral vascular disease [facility])    Past Surgical History:  Procedure Laterality Date  . ADENOSINE MYOVIEW  01/2006   No ischemia, EF 70%  . BREAST BIOPSY     Bilateral  . CAROTID U/S  10/2006   0-39% bilaterally  . CATARACT EXTRACTION  09/2006   Right  . CORONARY ANGIOPLASTY  12/2002   Medical rx only  . CORONARY ANGIOPLASTY  01/2006   LAD/PAD disease  . CORONARY ANGIOPLASTY  03/2007   3 vessel disease  . CORONARY STENT PLACEMENT  2002   MI, [facility]  . CORONARY STENT PLACEMENT  04/2007   RCA/LCX multiple, [facility]  . CP ADMIT  09/2005   Stress myoview negative  . FEMORAL HERNIA REPAIR  05/2007   Incarcerated, right  . FRACTURE SURGERY  03/2009   Left leg  . INTRAMEDULLARY (IM) NAIL INTERTROCHANTERIC Right 05/18/2016   Procedure: INTRAMEDULLARY (IM) NAIL INTERTROCHANTRIC;  Surgeon: [name], MD;  Location: [facility] ORS;  Service: Orthopedics;  Laterality: Right;  . INTRAMEDULLARY (IM) NAIL INTERTROCHANTERIC Left 06/18/2017   Procedure: INTRAMEDULLARY (IM) NAIL INTERTROCHANTRIC;  Surgeon: Deeann Saint, MD;  Location: [facility] ORS;  Service: Orthopedics;  Laterality: Left;  . NM MYOVIEW LTD  11/2004   Stress, negative; NL EF  . VESICOVAGINAL FISTULA CLOSURE W/ TAH      Allergies  Allergen Reactions  . Atorvastatin Other (See Comments)    Reaction:  Blisters on feet   . Diltiazem Hcl Other (See Comments)    Reaction:  Unknown   . Dye Fdc Red [Red Dye] Other (See Comments)    Reaction:  Unknown   . Gemfibrozil Other (See Comments)    Reaction:  Unknown   . Niacin Other (See Comments)    Reaction:  Unknown   . Sulfasalazine Rash    Allergies as  of 07/18/2017      Reactions   Atorvastatin Other (See Comments)   Reaction:  Blisters on feet    Diltiazem Hcl Other (See Comments)   Reaction:  Unknown    Dye Fdc Red [red Dye] Other (See Comments)   Reaction:  Unknown    Gemfibrozil Other (See Comments)   Reaction:  Unknown    Niacin Other (See Comments)   Reaction:  Unknown    Sulfasalazine Rash      Medication List       Accurate as of 07/18/17  4:03 PM. Always use your most recent med list.          aspirin EC 81 MG tablet Take 81 mg by mouth daily.   bisacodyl 10 MG suppository Commonly known as:  DULCOLAX Place 1 suppository (10 mg total) rectally daily as needed for moderate constipation.   carvedilol 3.125 MG tablet Commonly known as:  COREG TAKE 1 TABLET BY MOUTH TWO  TIMES DAILY   CEPACOL SORE THROAT 15-3.6 MG Lozg Generic drug:  Benzocaine-Menthol Use as directed 1 lozenge in the mouth or throat as needed.   cyanocobalamin 1000 MCG tablet Take 1,000 mcg by mouth daily.   feeding supplement (PRO-STAT SUGAR FREE 64) Liqd Take 30 mLs by mouth 2 (two) times daily between meals.   FIRST-VANCOMYCIN 50 50 MG/ML Soln Take 125 mg by mouth 4 (four) times daily.   isosorbide mononitrate 30 MG 24 hr tablet Commonly known as:  IMDUR TAKE 1 TABLET BY MOUTH  DAILY   lisinopril 10 MG tablet Commonly known as:  PRINIVIL,ZESTRIL TAKE 1 TABLET BY MOUTH  DAILY   meclizine 25 MG tablet Commonly known as:  ANTIVERT Take 1 tablet (25 mg total) by mouth 3 (three) times daily as needed for dizziness.   MEGARED OMEGA-3 KRILL OIL 500 MG Caps Take 1 capsule by mouth daily.   mirtazapine 7.5 MG tablet Commonly known as:  REMERON Take 7.5 mg by mouth at bedtime.   MULTIVITAL tablet Take 1 tablet by mouth daily.   nitroGLYCERIN 0.4 MG SL tablet Commonly known as:  NITROSTAT Place 1 tablet (0.4 mg total) under the tongue every 5 (five) minutes as needed for chest pain.   ondansetron 8 MG tablet Commonly known as:   ZOFRAN Take 8 mg by mouth every 6 (six) hours as needed for nausea or vomiting.   oxyCODONE 5 MG immediate release tablet Commonly known as:  Oxy IR/ROXICODONE Take 1-2 tablets (5-10 mg total) by mouth every 4 (four) hours as needed for breakthrough pain ((for MODERATE breakthrough pain)).   RISA-BID PROBIOTIC Tabs Take 1 tablet by mouth 2 (two) times daily.   rosuvastatin 5 MG tablet Commonly known as:  CRESTOR TAKE 1 TABLET BY MOUTH  DAILY   sennosides-docusate sodium 8.6-50 MG tablet Commonly known  as:  SENOKOT-S Take 2 tablets by mouth 2 (two) times daily.   TYLENOL 8 HOUR 650 MG CR tablet Generic drug:  acetaminophen Take 650 mg by mouth every 8 (eight) hours as needed for pain. Do not exceed 3000 mg / 24 hrs of acetaminophen. Consider all sources       Review of Systems  Constitutional: Negative for activity change, appetite change, chills, diaphoresis, fatigue and fever.  HENT: Negative for congestion, sneezing, sore throat, trouble swallowing and voice change.   Respiratory: Negative for apnea, cough, choking, chest tightness, shortness of breath and wheezing.   Cardiovascular: Negative for chest pain, palpitations and leg swelling.  Gastrointestinal: Negative for abdominal distention, abdominal pain, constipation, diarrhea and nausea.  Genitourinary: Negative.  Negative for difficulty urinating, dysuria, frequency and urgency.  Musculoskeletal: Positive for arthralgias (typical arthritis) and gait problem. Negative for back pain and myalgias.  Skin: Positive for wound. Negative for color change, pallor and rash.  Neurological: Positive for weakness. Negative for dizziness, tremors, syncope, speech difficulty, numbness and headaches.  Psychiatric/Behavioral: Negative for agitation and behavioral problems.  All other systems reviewed and are negative.   Immunization History  Administered Date(s) Administered  . Influenza Whole 11/05/2007, 09/15/2009, 09/15/2010  .  Influenza,inj,Quad PF,36+ Mos 08/26/2013, 09/04/2014, 09/06/2015, 11/13/2016  . Pneumococcal Conjugate-13 09/04/2014  . Pneumococcal Polysaccharide-23 12/18/1986  . Td 12/18/1993, 08/26/2013   Pertinent  Health Maintenance Due  Topic Date Due  . PNA vac Low Risk Adult (2 of 2 - PPSV23) 09/05/2015  . INFLUENZA VACCINE  07/18/2017  . DEXA SCAN  Completed   Fall Risk  11/13/2016 09/06/2015 09/04/2014  Falls in the past year? Yes Yes Yes  Number falls in past yr: 1 2 or more 2 or more  Injury with Fall? Yes Yes -  Risk Factor Category  - High Fall Risk High Fall Risk  Risk for fall due to : - History of fall(s);Impaired balance/gait;Impaired mobility History of fall(s);Impaired balance/gait;Impaired mobility  Follow up Education provided Falls prevention discussed;Falls evaluation completed -   Functional Status Survey:    Vitals:   07/18/17 1601  BP: (!) 179/67  Pulse: 67  Resp: 18  Temp: (!) 97.5 F (36.4 C)  SpO2: 99%  Weight: 104 lb 14.4 oz (47.6 kg)  Height: 5' (1.524 m)   Body mass index is 20.49 kg/m. Physical Exam  Constitutional: She is oriented to person, place, and time. Vital signs are normal. She appears well-developed and well-nourished. She is active and cooperative. She does not appear ill. No distress.  HENT:  Head: Normocephalic and atraumatic.  Mouth/Throat: Uvula is midline, oropharynx is clear and moist and mucous membranes are normal. Mucous membranes are not pale, not dry and not cyanotic.  Eyes: Pupils are equal, round, and reactive to light. Conjunctivae, EOM and lids are normal.  Neck: Trachea normal, normal range of motion and full passive range of motion without pain. Neck supple. No JVD present. No tracheal deviation, no edema and no erythema present. No thyromegaly present.  Cardiovascular: Normal rate, regular rhythm, normal heart sounds, intact distal pulses and normal pulses.  Exam reveals no gallop, no distant heart sounds and no friction rub.     No murmur heard. Pulses:      Dorsalis pedis pulses are 2+ on the right side, and 2+ on the left side.  No edema  Pulmonary/Chest: Effort normal and breath sounds normal. No accessory muscle usage. No respiratory distress. She has no decreased breath sounds. She has no  wheezes. She has no rhonchi. She has no rales. She exhibits no tenderness.  Abdominal: Normal appearance and bowel sounds are normal. She exhibits no distension and no ascites. There is no tenderness.  Musculoskeletal: She exhibits no edema.       Left hip: She exhibits decreased range of motion, decreased strength, tenderness and laceration (lincision).       Lumbar back: She exhibits bony tenderness and pain.  Expected osteoarthritis, stiffness. Calves soft, supple. Negative Homan's sign  Neurological: She is alert and oriented to person, place, and time. She has normal strength.  Skin: Skin is warm and dry. Laceration (left hip incision) noted. No rash noted. She is not diaphoretic. No cyanosis or erythema. No pallor. Nails show no clubbing.  Psychiatric: She has a normal mood and affect. Her speech is normal and behavior is normal. Judgment and thought content normal. Cognition and memory are normal.  Nursing note and vitals reviewed.   Labs reviewed:  Recent Labs  06/20/17 0325 06/22/17 1020 07/12/17 0700  NA 140 138 142  K 3.4* 3.8 3.1*  CL 107 105 109  CO2 27 29 27   GLUCOSE 127* 143* 86  BUN 12 20 19   CREATININE 0.86 0.74 0.70  CALCIUM 8.7* 8.8* 9.4  MG  --   --  2.1    Recent Labs  06/18/17 1401 06/22/17 1020 07/12/17 0700  AST 17 17 13*  ALT 13* 9* 9*  ALKPHOS 49 43 113  BILITOT 0.7 0.8 0.6  PROT 7.0 6.0* 6.1*  ALBUMIN 3.8 2.6* 2.9*    Recent Labs  11/13/16 1210  06/20/17 0325 06/22/17 1020 07/12/17 0700  WBC 8.0  < > 12.0* 10.0 5.8  NEUTROABS 5.5  --   --  8.2* 3.6  HGB 12.2  < > 9.5* 9.2* 9.3*  HCT 37.7  < > 29.2* 27.1* 27.8*  MCV 95.2  < > 94.8 96.0 94.6  PLT 213.0  < > 176 223  271  < > = values in this interval not displayed. Lab Results  Component Value Date   TSH 1.482 07/12/2017   No results found for: HGBA1C Lab Results  Component Value Date   CHOL 111 07/12/2017   HDL 35 (L) 07/12/2017   LDLCALC 54 07/12/2017   LDLDIRECT 77.8 01/28/2010   TRIG 112 07/12/2017   CHOLHDL 3.2 07/12/2017    Significant Diagnostic Results in last 30 days:  Dg C-arm 1-60 Min  Result Date: 06/18/2017 CLINICAL DATA:  Internal fixation of left hip fracture. Initial encounter. EXAM: OPERATIVE LEFT HIP (WITH PELVIS IF PERFORMED) 2 VIEWS TECHNIQUE: Fluoroscopic spot image(s) were submitted for interpretation post-operatively. COMPARISON:  Left hip radiographs performed earlier today at 2:36 p.m. FINDINGS: Five fluoroscopic C-arm images are provided from the OR, demonstrating placement of a dynamic left hip screw, transfixing the patient's left femoral intertrochanteric fracture in near anatomic alignment. No new fractures are seen. Overlying postoperative soft tissue air is noted. IMPRESSION: Status post internal fixation of left femoral intertrochanteric fracture in near anatomic alignment. Electronically Signed   By: Roanna RaiderJeffery  Chang M.D.   On: 06/18/2017 23:10   Dg Hip Operative Unilat With Pelvis Left  Result Date: 06/18/2017 CLINICAL DATA:  Internal fixation of left hip fracture. Initial encounter. EXAM: OPERATIVE LEFT HIP (WITH PELVIS IF PERFORMED) 2 VIEWS TECHNIQUE: Fluoroscopic spot image(s) were submitted for interpretation post-operatively. COMPARISON:  Left hip radiographs performed earlier today at 2:36 p.m. FINDINGS: Five fluoroscopic C-arm images are provided from the OR, demonstrating placement of a  dynamic left hip screw, transfixing the patient's left femoral intertrochanteric fracture in near anatomic alignment. No new fractures are seen. Overlying postoperative soft tissue air is noted. IMPRESSION: Status post internal fixation of left femoral intertrochanteric fracture in  near anatomic alignment. Electronically Signed   By: Roanna Raider M.D.   On: 06/18/2017 23:10    Assessment/Plan 1. Acute midline low back pain without sciatica  Xrays  Acetaminophen 650 mg po QID scheduled  Oxycodone 5 mg po QID scheduled  Oxycodone 5 mg 1-2 tablets po Q 4 hours prn  Continue Effexor 75 mg po Q Day  2. Fall, initial encounter  Safety precautions  Continue working with PT/OT  Encouraged pt to call for assistance when needed  3. Clostridium difficile colitis  Improved  Asymptomatic    Vancomycin course to be completed today  Recheck stool for c-diff tomorrow  Family/ staff Communication:   Total Time:  Documentation:  Face to Face:  Family/Phone:   Labs/tests ordered: T-spine, L-spine and S-spine Xrays  Medication list reviewed and assessed for continued appropriateness.  Brynda Rim, NP-C Geriatrics [facility] [phone] [address], [place] [address] Cell Phone (Mon-Fri 8am-5pm):  [phone] On Call:  [phone] & follow prompts after 5pm & weekends Office Phone:  [phone] Office Fax:  [phone]

## 2017-07-19 ENCOUNTER — Other Ambulatory Visit
Admission: RE | Admit: 2017-07-19 | Discharge: 2017-07-19 | Disposition: A | Discharge status: Home / Self Care | Payer: Medicare Other | Source: Ambulatory Visit | Attending: Gerontology | Admitting: Gerontology

## 2017-07-19 DIAGNOSIS — E876 Hypokalemia: Secondary | ICD-10-CM | POA: Insufficient documentation

## 2017-07-19 DIAGNOSIS — D649 Anemia, unspecified: Secondary | ICD-10-CM | POA: Insufficient documentation

## 2017-07-19 LAB — CBC WITH DIFFERENTIAL/PLATELET
BASOS ABS: 0 10*3/uL (ref 0–0.1)
Basophils Relative: 1 %
Eosinophils Absolute: 0.4 10*3/uL (ref 0–0.7)
Eosinophils Relative: 6 %
HEMATOCRIT: 29.5 % — AB (ref 35.0–47.0)
Hemoglobin: 9.8 g/dL — ABNORMAL LOW (ref 12.0–16.0)
LYMPHS ABS: 1.2 10*3/uL (ref 1.0–3.6)
LYMPHS PCT: 17 %
MCH: 31.8 pg (ref 26.0–34.0)
MCHC: 33.3 g/dL (ref 32.0–36.0)
MCV: 95.7 fL (ref 80.0–100.0)
MONO ABS: 0.6 10*3/uL (ref 0.2–0.9)
Monocytes Relative: 9 %
NEUTROS ABS: 4.8 10*3/uL (ref 1.4–6.5)
Neutrophils Relative %: 67 %
Platelets: 174 10*3/uL (ref 150–440)
RBC: 3.09 MIL/uL — ABNORMAL LOW (ref 3.80–5.20)
RDW: 14 % (ref 11.5–14.5)
WBC: 7.1 10*3/uL (ref 3.6–11.0)

## 2017-07-19 LAB — COMPREHENSIVE METABOLIC PANEL
ALT: 12 U/L — AB (ref 14–54)
AST: 15 U/L (ref 15–41)
Albumin: 3 g/dL — ABNORMAL LOW (ref 3.5–5.0)
Alkaline Phosphatase: 103 U/L (ref 38–126)
Anion gap: 5 (ref 5–15)
BILIRUBIN TOTAL: 0.5 mg/dL (ref 0.3–1.2)
BUN: 21 mg/dL — AB (ref 6–20)
CO2: 29 mmol/L (ref 22–32)
CREATININE: 0.8 mg/dL (ref 0.44–1.00)
Calcium: 9.7 mg/dL (ref 8.9–10.3)
Chloride: 107 mmol/L (ref 101–111)
GFR calc Af Amer: >60 mL/min (ref >60)
Glucose, Bld: 92 mg/dL (ref 65–99)
POTASSIUM: 4.1 mmol/L (ref 3.5–5.1)
Sodium: 141 mmol/L (ref 135–145)
TOTAL PROTEIN: 6.1 g/dL — AB (ref 6.5–8.1)

## 2017-07-23 DIAGNOSIS — E559 Vitamin D deficiency, unspecified: Secondary | ICD-10-CM | POA: Insufficient documentation

## 2017-07-23 DIAGNOSIS — E538 Deficiency of other specified B group vitamins: Secondary | ICD-10-CM | POA: Insufficient documentation

## 2017-07-25 ENCOUNTER — Non-Acute Institutional Stay (SKILLED_NURSING_FACILITY): Payer: Medicare Other | Admitting: Gerontology

## 2017-07-25 DIAGNOSIS — S70222A Blister (nonthermal), left hip, initial encounter: Secondary | ICD-10-CM | POA: Diagnosis not present

## 2017-07-25 DIAGNOSIS — A0472 Enterocolitis due to Clostridium difficile, not specified as recurrent: Secondary | ICD-10-CM

## 2017-07-25 DIAGNOSIS — K5900 Constipation, unspecified: Secondary | ICD-10-CM | POA: Diagnosis not present

## 2017-07-25 DIAGNOSIS — S72002D Fracture of unspecified part of neck of left femur, subsequent encounter for closed fracture with routine healing: Secondary | ICD-10-CM

## 2017-07-26 ENCOUNTER — Encounter: Payer: Self-pay | Admitting: Gerontology

## 2017-07-26 NOTE — Progress Notes (Signed)
Location:   [facility] Room Number: [phone] Place of Service:  SNF [phone]) Provider:  Lorenso Quarry, NP-C  Karie Schwalbe, MD  Patient Care Team: Karie Schwalbe, MD as PCP - General Iran Ouch, MD as Consulting Physician (Cardiology)  Extended Emergency Contact Information Primary Emergency Contact: Zoila Shutter [place] Home Phone: [phone] Work Phone: [phone] Mobile Phone: [phone] Relation: Daughter Secondary Emergency Contact: Marjie Skiff [place] Home Phone: [phone] Relation: Son  Code Status:  DNR Goals of care: Advanced Directive information Advanced Directives 07/26/2017  Does Patient Have a Medical Advance Directive? Yes  Type of Advance Directive Out of facility DNR (pink MOST or yellow form)  Does patient want to make changes to medical advance directive? No - Patient declined  Copy of Healthcare Power of Attorney in Chart? -  Would patient like information on creating a medical advance directive? -     Chief Complaint  Patient presents with  . Medical Management of Chronic Issues    Routine Visit    HPI:  Pt is a 83 y.o. female seen today for follow up. Pt was admitted to the facility for rehab following hospitalization for Left Hip Fracture with IM nailing. Pt has been participating in PT and OT. She is doing well. Pt reports her pain is controlled on current regimen. She reports her appetite is good but is not having regular BM's. Pt denies n/v/d/f/c/cp/sob/ha/abd pain/dizziness/cough. She is close to  completing her antibiotics for C-Diff. Pt reports complete resolution of symptoms and says she is feeling much better. VSS. No other complaints.      Past Medical History:  Diagnosis Date  . CAD (coronary artery disease)    a. S/P previous MI;  b. 04/2007 Cath/PCI: Taxus DES' to mid/distal RCA and RPDA as well as Ramus;  c. myoview 8/09: EF 78%, normal perfusion  . Carotid  stenosis    Bilateral, mild to moderate  . Chronic back pain   . Chronic shoulder pain   . Hyperlipidemia    Intolerant of many statins  . Hypertension   . Mild mitral and aortic regurgitation    a. 11/2011 Echo: EF 55-65%, No RWMA, Gr 1 DD, Mild AI/MR.  Marland Kitchen Neuropathy   . Osteoarthritis   . Osteoporosis   . Pelvic fracture (HCC) 6/16  . Peripheral vascular disease [facility])    Past Surgical History:  Procedure Laterality Date  . ADENOSINE MYOVIEW  01/2006   No ischemia, EF 70%  . BREAST BIOPSY     Bilateral  . CAROTID U/S  10/2006   0-39% bilaterally  . CATARACT EXTRACTION  09/2006   Right  . CORONARY ANGIOPLASTY  12/2002   Medical rx only  . CORONARY ANGIOPLASTY  01/2006   LAD/PAD disease  . CORONARY ANGIOPLASTY  03/2007   3 vessel disease  . CORONARY STENT PLACEMENT  2002   MI, [facility]  . CORONARY STENT PLACEMENT  04/2007   RCA/LCX multiple, [facility]  . CP ADMIT  09/2005   Stress myoview negative  . FEMORAL HERNIA REPAIR  05/2007   Incarcerated, right  . FRACTURE SURGERY  03/2009   Left leg  . INTRAMEDULLARY (IM) NAIL INTERTROCHANTERIC Right 05/18/2016   Procedure: INTRAMEDULLARY (IM) NAIL INTERTROCHANTRIC;  Surgeon: [name], MD;  Location: [facility] ORS;  Service: Orthopedics;  Laterality: Right;  . INTRAMEDULLARY (IM) NAIL INTERTROCHANTERIC Left 06/18/2017   Procedure: INTRAMEDULLARY (IM) NAIL INTERTROCHANTRIC;  Surgeon: Deeann Saint, MD;  Location:  [facility] ORS;  Service: Orthopedics;  Laterality: Left;  . NM MYOVIEW LTD  11/2004   Stress, negative; NL EF  . VESICOVAGINAL FISTULA CLOSURE W/ TAH      Allergies  Allergen Reactions  . Atorvastatin Other (See Comments)    Reaction:  Blisters on feet   . Diltiazem Hcl Other (See Comments)    Reaction:  Unknown   . Dye Fdc Red [Red Dye] Other (See Comments)    Reaction:  Unknown   . Gemfibrozil Other (See Comments)    Reaction:  Unknown   . Niacin Other (See Comments)    Reaction:  Unknown   . Sulfasalazine Rash     Allergies as of 07/25/2017      Reactions   Atorvastatin Other (See Comments)   Reaction:  Blisters on feet    Diltiazem Hcl Other (See Comments)   Reaction:  Unknown    Dye Fdc Red [red Dye] Other (See Comments)   Reaction:  Unknown    Gemfibrozil Other (See Comments)   Reaction:  Unknown    Niacin Other (See Comments)   Reaction:  Unknown    Sulfasalazine Rash      Medication List       Accurate as of 07/25/17 11:59 PM. Always use your most recent med list.          ASPERCREME LIDOCAINE 4 % Ptch Generic drug:  Lidocaine Apply 1 patch topically daily. Apply to lower back/sacrum for pain. Remove after 12 hours.   aspirin EC 81 MG tablet Take 81 mg by mouth daily.   bisacodyl 10 MG suppository Commonly known as:  DULCOLAX Place 1 suppository (10 mg total) rectally daily as needed for moderate constipation.   carvedilol 3.125 MG tablet Commonly known as:  COREG TAKE 1 TABLET BY MOUTH TWO  TIMES DAILY   CEPACOL SORE THROAT 15-3.6 MG Lozg Generic drug:  Benzocaine-Menthol Use as directed 1 lozenge in the mouth or throat as needed.   cyanocobalamin 1000 MCG tablet Take 1,000 mcg by mouth daily.   feeding supplement (PRO-STAT SUGAR FREE 64) Liqd Take 30 mLs by mouth 2 (two) times daily between meals.   FIRST-VANCOMYCIN 50 50 MG/ML Soln Take 125 mg by mouth 4 (four) times daily.   isosorbide mononitrate 30 MG 24 hr tablet Commonly known as:  IMDUR TAKE 1 TABLET BY MOUTH  DAILY   lisinopril 10 MG tablet Commonly known as:  PRINIVIL,ZESTRIL TAKE 1 TABLET BY MOUTH  DAILY   meclizine 25 MG tablet Commonly known as:  ANTIVERT Take 1 tablet (25 mg total) by mouth 3 (three) times daily as needed for dizziness.   MEGARED OMEGA-3 KRILL OIL 500 MG Caps Take 1 capsule by mouth daily.   mirtazapine 7.5 MG tablet Commonly known as:  REMERON Take 7.5 mg by mouth at bedtime.   MULTIVITAL tablet Take 1 tablet by mouth daily.   nitroGLYCERIN 0.4 MG SL  tablet Commonly known as:  NITROSTAT Place 1 tablet (0.4 mg total) under the tongue every 5 (five) minutes as needed for chest pain.   ondansetron 8 MG tablet Commonly known as:  ZOFRAN Take 8 mg by mouth every 6 (six) hours as needed for nausea or vomiting.   oxyCODONE 5 MG immediate release tablet Commonly known as:  Oxy IR/ROXICODONE Take 1-2 tablets (5-10 mg total) by mouth every 4 (four) hours as needed for breakthrough pain ((for MODERATE breakthrough pain)).   RISA-BID PROBIOTIC Tabs Take 1 tablet by mouth 2 (two) times  daily.   TYLENOL 8 HOUR 650 MG CR tablet Generic drug:  acetaminophen Take 650 mg by mouth every 8 (eight) hours as needed for pain. Do not exceed 3000 mg / 24 hrs of acetaminophen. Consider all sources   venlafaxine XR 37.5 MG 24 hr capsule Commonly known as:  EFFEXOR-XR Take 37.5 mg by mouth daily with breakfast.   Vitamin D3 2000 units capsule Take 2,000 Units by mouth daily.       Review of Systems  Constitutional: Negative for activity change, appetite change, chills, diaphoresis and fever.  HENT: Negative for congestion, sneezing, sore throat, trouble swallowing and voice change.   Respiratory: Negative for apnea, cough, choking, chest tightness, shortness of breath and wheezing.   Cardiovascular: Negative for chest pain, palpitations and leg swelling.  Gastrointestinal: Negative for abdominal distention, abdominal pain, constipation, diarrhea and nausea.  Genitourinary: Negative for difficulty urinating, dysuria, frequency and urgency.  Musculoskeletal: Positive for arthralgias (typical arthritis) and gait problem. Negative for back pain and myalgias.  Skin: Positive for wound. Negative for color change, pallor and rash.  Neurological: Negative for dizziness, tremors, syncope, speech difficulty, weakness, numbness and headaches.  Psychiatric/Behavioral: Negative for agitation and behavioral problems.  All other systems reviewed and are  negative.   Immunization History  Administered Date(s) Administered  . Influenza Whole 11/05/2007, 09/15/2009, 09/15/2010  . Influenza,inj,Quad PF,36+ Mos 08/26/2013, 09/04/2014, 09/06/2015, 11/13/2016  . Pneumococcal Conjugate-13 09/04/2014  . Pneumococcal Polysaccharide-23 12/18/1986  . Td 12/18/1993, 08/26/2013   Pertinent  Health Maintenance Due  Topic Date Due  . PNA vac Low Risk Adult (2 of 2 - PPSV23) 09/05/2015  . INFLUENZA VACCINE  07/18/2017  . DEXA SCAN  Completed   Fall Risk  11/13/2016 09/06/2015 09/04/2014  Falls in the past year? Yes Yes Yes  Number falls in past yr: 1 2 or more 2 or more  Injury with Fall? Yes Yes -  Risk Factor Category  - High Fall Risk High Fall Risk  Risk for fall due to : - History of fall(s);Impaired balance/gait;Impaired mobility History of fall(s);Impaired balance/gait;Impaired mobility  Follow up Education provided Falls prevention discussed;Falls evaluation completed -   Functional Status Survey:    Vitals:   07/25/17 0913  BP: (!) 134/58  Pulse: 63  Resp: 16  Temp: 97.9 F (36.6 C)  SpO2: 95%  Weight: 106 lb 6.4 oz (48.3 kg)  Height: 5' (1.524 m)   Body mass index is 20.78 kg/m. Physical Exam  Constitutional: She is oriented to person, place, and time. Vital signs are normal. She appears well-developed and well-nourished. She is active and cooperative. She does not appear ill. No distress.  HENT:  Head: Normocephalic and atraumatic.  Mouth/Throat: Uvula is midline, oropharynx is clear and moist and mucous membranes are normal. Mucous membranes are not pale, not dry and not cyanotic.  Eyes: Pupils are equal, round, and reactive to light. Conjunctivae, EOM and lids are normal.  Neck: Trachea normal, normal range of motion and full passive range of motion without pain. Neck supple. No JVD present. No tracheal deviation, no edema and no erythema present. No thyromegaly present.  Cardiovascular: Normal rate, regular rhythm, normal  heart sounds, intact distal pulses and normal pulses.  Exam reveals no gallop, no distant heart sounds and no friction rub.   No murmur heard. Pulses:      Dorsalis pedis pulses are 2+ on the right side, and 2+ on the left side.  No edema  Pulmonary/Chest: Effort normal and breath sounds  normal. No accessory muscle usage. No respiratory distress. She has no decreased breath sounds. She has no wheezes. She has no rhonchi. She has no rales. She exhibits no tenderness.  Abdominal: Normal appearance and bowel sounds are normal. She exhibits no distension and no ascites. There is no tenderness.  Musculoskeletal: She exhibits no edema.       Left hip: She exhibits decreased range of motion, decreased strength, tenderness and laceration (lincision).       Lumbar back: She exhibits bony tenderness and pain.  Expected osteoarthritis, stiffness. Calves soft, supple. Negative Homan's sign  Neurological: She is alert and oriented to person, place, and time. She has normal strength.  Skin: Skin is warm and dry. Laceration (left hip incision) noted. No rash noted. She is not diaphoretic. No cyanosis or erythema. No pallor. Nails show no clubbing.  Psychiatric: She has a normal mood and affect. Her speech is normal and behavior is normal. Judgment and thought content normal. Cognition and memory are normal.  Nursing note and vitals reviewed.   Labs reviewed:  Recent Labs  06/22/17 1020 07/12/17 0700 07/19/17 0527  NA 138 142 141  K 3.8 3.1* 4.1  CL 105 109 107  CO2 29 27 29   GLUCOSE 143* 86 92  BUN 20 19 21*  CREATININE 0.74 0.70 0.80  CALCIUM 8.8* 9.4 9.7  MG  --  2.1  --     Recent Labs  06/22/17 1020 07/12/17 0700 07/19/17 0527  AST 17 13* 15  ALT 9* 9* 12*  ALKPHOS 43 113 103  BILITOT 0.8 0.6 0.5  PROT 6.0* 6.1* 6.1*  ALBUMIN 2.6* 2.9* 3.0*    Recent Labs  06/22/17 1020 07/12/17 0700 07/19/17 0527  WBC 10.0 5.8 7.1  NEUTROABS 8.2* 3.6 4.8  HGB 9.2* 9.3* 9.8*  HCT 27.1*  27.8* 29.5*  MCV 96.0 94.6 95.7  PLT 223 271 174   Lab Results  Component Value Date   TSH 1.482 07/12/2017   No results found for: HGBA1C Lab Results  Component Value Date   CHOL 111 07/12/2017   HDL 35 (L) 07/12/2017   LDLCALC 54 07/12/2017   LDLDIRECT 77.8 01/28/2010   TRIG 112 07/12/2017   CHOLHDL 3.2 07/12/2017    Significant Diagnostic Results in last 30 days:  No results found.  Assessment/Plan 1. Closed fracture of left hip with routine healing, subsequent encounter  Continue PT/OT  Continue exercises as taught by PT/OT  Ice to the hip prn  Dressing changes/ wound care per protocol  Continue Oxycodone 5 mg 1-2 tablets PO Q 4 hours prn  Continue Tylenol 650 mg ER po Q 8 hours prn  2. Constipation  Hold Senna S- 2 tablets PO BID  Increase po fluid intake  3. Blister of hip, left, initial encounter  Resolved  4. Clostridium difficile colitis  Continue Vancomycin 125 mg po QID x 4 more days  Recheck stool sample at completion of treatment for cdiff  Family/ staff Communication:   Total Time:  Documentation:  Face to Face:  Family/Phone:   Labs/tests ordered:    Medication list reviewed and assessed for continued appropriateness. Monthly medication orders reviewed and signed.  Brynda Rim, NP-C Geriatrics [facility] [phone] [address], [place] [address] Cell Phone (Mon-Fri 8am-5pm):  [phone] On Call:  [phone] & follow prompts after 5pm & weekends Office Phone:  [phone] Office Fax:  [phone]

## 2017-07-27 ENCOUNTER — Telehealth: Payer: Self-pay

## 2017-07-27 NOTE — Telephone Encounter (Signed)
Spoke to pt's daughter, Carney BernJean. She said she is leaning more towards a home visit, but may need to try and come in for pain management. She will call back Monday and let us know since she is not sure how her mom will do over the weekend just returning home.

## 2017-07-27 NOTE — Telephone Encounter (Signed)
Christina KnockOkay Find out if she can make it in for a follow up after her rehab stay or if she will need a home visit

## 2017-07-27 NOTE — Telephone Encounter (Signed)
Christina [name] with [facility] left v/m; pt is being discharged [facility] and request order that pt continue palliative care at home for symptom management and goals of care. Christina KidneyDebra will [facility] to [facility]; fax # [phone].

## 2017-07-29 ENCOUNTER — Other Ambulatory Visit
Admission: RE | Admit: 2017-07-29 | Discharge: 2017-07-29 | Disposition: A | Discharge status: Home / Self Care | Payer: Medicare Other | Source: Skilled Nursing Facility | Attending: Gerontology | Admitting: Gerontology

## 2017-07-29 DIAGNOSIS — Z01812 Encounter for preprocedural laboratory examination: Secondary | ICD-10-CM | POA: Insufficient documentation

## 2017-07-29 LAB — CLOSTRIDIUM DIFFICILE BY PCR: Toxigenic C. Difficile by PCR: POSITIVE — AB

## 2017-07-29 LAB — C DIFFICILE QUICK SCREEN W PCR REFLEX
C DIFFICILE (CDIFF) TOXIN: NEGATIVE
C DIFFICLE (CDIFF) ANTIGEN: POSITIVE — AB

## 2017-07-31 DIAGNOSIS — S72142A Displaced intertrochanteric fracture of left femur, initial encounter for closed fracture: Secondary | ICD-10-CM | POA: Diagnosis not present

## 2017-07-31 DIAGNOSIS — M25551 Pain in right hip: Secondary | ICD-10-CM | POA: Diagnosis not present

## 2017-08-01 DIAGNOSIS — S72142D Displaced intertrochanteric fracture of left femur, subsequent encounter for closed fracture with routine healing: Secondary | ICD-10-CM | POA: Diagnosis not present

## 2017-08-01 DIAGNOSIS — E785 Hyperlipidemia, unspecified: Secondary | ICD-10-CM | POA: Diagnosis not present

## 2017-08-01 DIAGNOSIS — M549 Dorsalgia, unspecified: Secondary | ICD-10-CM | POA: Diagnosis not present

## 2017-08-01 DIAGNOSIS — I10 Essential (primary) hypertension: Secondary | ICD-10-CM | POA: Diagnosis not present

## 2017-08-01 DIAGNOSIS — I2510 Atherosclerotic heart disease of native coronary artery without angina pectoris: Secondary | ICD-10-CM | POA: Diagnosis not present

## 2017-08-02 NOTE — Telephone Encounter (Signed)
Dr Alphonsus SiasLetvak spoke to [place] today. Making a home visit next week.

## 2017-08-03 DIAGNOSIS — I2510 Atherosclerotic heart disease of native coronary artery without angina pectoris: Secondary | ICD-10-CM | POA: Diagnosis not present

## 2017-08-06 DIAGNOSIS — S72142D Displaced intertrochanteric fracture of left femur, subsequent encounter for closed fracture with routine healing: Secondary | ICD-10-CM | POA: Diagnosis not present

## 2017-08-06 DIAGNOSIS — E785 Hyperlipidemia, unspecified: Secondary | ICD-10-CM | POA: Diagnosis not present

## 2017-08-06 DIAGNOSIS — I10 Essential (primary) hypertension: Secondary | ICD-10-CM | POA: Diagnosis not present

## 2017-08-06 DIAGNOSIS — I2510 Atherosclerotic heart disease of native coronary artery without angina pectoris: Secondary | ICD-10-CM | POA: Diagnosis not present

## 2017-08-06 DIAGNOSIS — M549 Dorsalgia, unspecified: Secondary | ICD-10-CM | POA: Diagnosis not present

## 2017-08-07 DIAGNOSIS — E785 Hyperlipidemia, unspecified: Secondary | ICD-10-CM | POA: Diagnosis not present

## 2017-08-07 DIAGNOSIS — S72142D Displaced intertrochanteric fracture of left femur, subsequent encounter for closed fracture with routine healing: Secondary | ICD-10-CM | POA: Diagnosis not present

## 2017-08-07 DIAGNOSIS — M549 Dorsalgia, unspecified: Secondary | ICD-10-CM | POA: Diagnosis not present

## 2017-08-07 DIAGNOSIS — I2510 Atherosclerotic heart disease of native coronary artery without angina pectoris: Secondary | ICD-10-CM | POA: Diagnosis not present

## 2017-08-07 DIAGNOSIS — I10 Essential (primary) hypertension: Secondary | ICD-10-CM | POA: Diagnosis not present

## 2017-08-08 ENCOUNTER — Encounter: Payer: Self-pay | Admitting: Internal Medicine

## 2017-08-08 ENCOUNTER — Ambulatory Visit: Payer: Medicare Other | Admitting: Internal Medicine

## 2017-08-08 ENCOUNTER — Telehealth: Payer: Self-pay

## 2017-08-08 VITALS — BP 122/60 | HR 64 | Resp 16

## 2017-08-08 DIAGNOSIS — M549 Dorsalgia, unspecified: Secondary | ICD-10-CM | POA: Diagnosis not present

## 2017-08-08 DIAGNOSIS — Z8781 Personal history of (healed) traumatic fracture: Secondary | ICD-10-CM | POA: Diagnosis not present

## 2017-08-08 DIAGNOSIS — I10 Essential (primary) hypertension: Secondary | ICD-10-CM | POA: Diagnosis not present

## 2017-08-08 DIAGNOSIS — E785 Hyperlipidemia, unspecified: Secondary | ICD-10-CM | POA: Diagnosis not present

## 2017-08-08 DIAGNOSIS — E441 Mild protein-calorie malnutrition: Secondary | ICD-10-CM | POA: Diagnosis not present

## 2017-08-08 DIAGNOSIS — F39 Unspecified mood [affective] disorder: Secondary | ICD-10-CM | POA: Diagnosis not present

## 2017-08-08 DIAGNOSIS — S72142D Displaced intertrochanteric fracture of left femur, subsequent encounter for closed fracture with routine healing: Secondary | ICD-10-CM | POA: Diagnosis not present

## 2017-08-08 DIAGNOSIS — A0472 Enterocolitis due to Clostridium difficile, not specified as recurrent: Secondary | ICD-10-CM | POA: Diagnosis not present

## 2017-08-08 DIAGNOSIS — I2510 Atherosclerotic heart disease of native coronary artery without angina pectoris: Secondary | ICD-10-CM | POA: Diagnosis not present

## 2017-08-08 DIAGNOSIS — I25119 Atherosclerotic heart disease of native coronary artery with unspecified angina pectoris: Secondary | ICD-10-CM

## 2017-08-08 NOTE — Assessment & Plan Note (Signed)
On mirtazapine for appetite and now on venlafaxine Not sure she will need extended Rx--- as this is mostly reactive Mood is better back home again

## 2017-08-08 NOTE — Progress Notes (Signed)
Subjective:    Patient ID: Christina [name], female    DOB: 11-01-1920, 83 y.o.   MRN: [id number]  HPI Home visit for follow up of hip fracture hospital/surgery and then rehab   Just had a left hip fracture Repaired and went to [facility] for rehab Home 9 days  Put on ibandronate Asked her to restart vitamin D--hasn't been taking this  Daughter [name] is now staying with her again --will leave her for short periods Having home /OT/nursing Left hip pain is controlled---now with more pain in her right hip (after fall at rehab)--tramadol does help some. Uses tylenol at times as well Pelvic pain is finally  Walking with walker independently to bathroom Dressing herself-- just doing sponge bath (is having aide tomorrow to help with shower---has seat and hand held sprayer)  Had increased mood problems with this injury Started pm venlafaxine at rehab due to this On mirtazapine--- ?appetite stimulant --while there also  No appetite  Has lost more weight  No recent chest pain Breathing is okay No edema Some dizziness when she gets   Had C diff while in rehab Got 14 days of antibioticvancomycin. Done with this now Bowels formed again  Current Outpatient Prescriptions on File Prior to Visit  Medication Sig Dispense Refill  . acetaminophen (TYLENOL 8 HOUR) 650 MG CR tablet Take 650 mg by mouth every 8 (eight) hours as needed for pain. Do not exceed 3000 mg / 24 hrs of acetaminophen. Consider all sources    . aspirin EC 81 MG tablet Take 81 mg by mouth daily.     . carvedilol (COREG) 3.125 MG tablet TAKE 1 TABLET BY MOUTH TWO  TIMES DAILY 180 tablet 3  . Cholecalciferol (VITAMIN D3) 2000 units capsule Take 2,000 Units by mouth daily.    . isosorbide mononitrate (IMDUR) 30 MG 24 hr tablet TAKE 1 TABLET BY MOUTH  DAILY 90 tablet 3  . Lidocaine (ASPERCREME LIDOCAINE) 4 % PTCH Apply 1 patch topically daily. Apply to lower back/sacrum for pain. Remove after 12 hours.    Marland Kitchen lisinopril  (PRINIVIL,ZESTRIL) 10 MG tablet TAKE 1 TABLET BY MOUTH  DAILY 90 tablet 3  . meclizine (ANTIVERT) 25 MG tablet Take 1 tablet (25 mg total) by mouth 3 (three) times daily as needed for dizziness. 30 tablet 3  . MEGARED OMEGA-3 KRILL OIL 500 MG CAPS Take 1 capsule by mouth daily.     . mirtazapine (REMERON) 7.5 MG tablet Take 7.5 mg by mouth at bedtime.     . Multiple Vitamins-Minerals (MULTIVITAL) tablet Take 1 tablet by mouth daily.    . nitroGLYCERIN (NITROSTAT) 0.4 MG SL tablet Place 1 tablet (0.4 mg total) under the tongue every 5 (five) minutes as needed for chest pain. 25 tablet 2  . ondansetron (ZOFRAN) 8 MG tablet Take 8 mg by mouth every 6 (six) hours as needed for nausea or vomiting.    . Probiotic Product (RISA-BID PROBIOTIC) TABS Take 1 tablet by mouth 2 (two) times daily.    Marland Kitchen venlafaxine XR (EFFEXOR-XR) 37.5 MG 24 hr capsule Take 37.5 mg by mouth daily with breakfast.     No current facility-administered medications on file prior to visit.     Allergies  Allergen Reactions  . Atorvastatin Other (See Comments)    Reaction:  Blisters on feet   . Diltiazem Hcl Other (See Comments)    Reaction:  Unknown   . Dye Fdc Red [Red Dye] Other (See Comments)    Reaction:  Unknown   . Gemfibrozil Other (See Comments)    Reaction:  Unknown   . Niacin Other (See Comments)    Reaction:  Unknown   . Sulfasalazine Rash    Past Medical History:  Diagnosis Date  . CAD (coronary artery disease)    a. S/P previous MI;  b. 04/2007 Cath/PCI: Taxus DES' to mid/distal RCA and RPDA as well as Ramus;  c. myoview 8/09: EF 78%, normal perfusion  . Carotid stenosis    Bilateral, mild to moderate  . Chronic back pain   . Chronic shoulder pain   . Hyperlipidemia    Intolerant of many statins  . Hypertension   . Mild mitral and aortic regurgitation    a. 11/2011 Echo: EF 55-65%, No RWMA, Gr 1 DD, Mild AI/MR.  Marland Kitchen Neuropathy   . Osteoarthritis   . Osteoporosis   . Pelvic fracture (HCC) 6/16  .  Peripheral vascular disease [facility])     Past Surgical History:  Procedure Laterality Date  . ADENOSINE MYOVIEW  01/2006   No ischemia, EF 70%  . BREAST BIOPSY     Bilateral  . CAROTID U/S  10/2006   0-39% bilaterally  . CATARACT EXTRACTION  09/2006   Right  . CORONARY ANGIOPLASTY  12/2002   Medical rx only  . CORONARY ANGIOPLASTY  01/2006   LAD/PAD disease  . CORONARY ANGIOPLASTY  03/2007   3 vessel disease  . CORONARY STENT PLACEMENT  2002   MI, [facility]  . CORONARY STENT PLACEMENT  04/2007   RCA/LCX multiple, [facility]  . CP ADMIT  09/2005   Stress myoview negative  . FEMORAL HERNIA REPAIR  05/2007   Incarcerated, right  . FRACTURE SURGERY  03/2009   Left leg  . INTRAMEDULLARY (IM) NAIL INTERTROCHANTERIC Right 05/18/2016   Procedure: INTRAMEDULLARY (IM) NAIL INTERTROCHANTRIC;  Surgeon: [name], MD;  Location: [facility] ORS;  Service: Orthopedics;  Laterality: Right;  . INTRAMEDULLARY (IM) NAIL INTERTROCHANTERIC Left 06/18/2017   Procedure: INTRAMEDULLARY (IM) NAIL INTERTROCHANTRIC;  Surgeon: Deeann Saint, MD;  Location: [facility] ORS;  Service: Orthopedics;  Laterality: Left;  . NM MYOVIEW LTD  11/2004   Stress, negative; NL EF  . VESICOVAGINAL FISTULA CLOSURE W/ TAH      Family History  Problem Relation Age of Onset  . Pulmonary embolism Mother   . Heart disease Father   . Heart disease Sister   . Cancer Brother   . Heart disease Sister     Social History   Social History  . Marital status: Widowed    Spouse name: N/A  . Number of children: 3  . Years of education: 12   Occupational History  .      retired from [employer]   Social History Main Topics  . Smoking status: Never Smoker  . Smokeless tobacco: Never Used  . Alcohol use No  . Drug use: No  . Sexual activity: Not on file   Other Topics Concern  . Not on file   Social History Narrative   Widowed 2005 after long time care for husband after stroke   Remarried but seperated   Has 3 children      No  formal living will   Daughter [name] should be her health care POA   Has DNR already   No feeding tube if cognitively unaware   Review of Systems Sleeping okay Husband had stroke--- back living with his daughter though. She does see him fairly reguarly     Objective:  Physical Exam  Constitutional: No distress.  Neck: No thyromegaly present.  Cardiovascular: Normal rate and normal heart sounds.   Pulmonary/Chest: Effort normal. No respiratory distress. She has no wheezes. She has no rales.  Abdominal: Soft. She exhibits no distension. There is no tenderness. There is no rebound and no guarding.  Musculoskeletal: She exhibits no edema.  Lymphadenopathy:    She has no cervical adenopathy.  Psychiatric: She has a normal mood and affect. Her behavior is normal.          Assessment & Plan:

## 2017-08-08 NOTE — Telephone Encounter (Signed)
Connie OT with [facility] left v/m requesting verbal orders for HH OT 2 x a week for 6 weeks. 

## 2017-08-08 NOTE — Assessment & Plan Note (Signed)
Lost weight in hospital and rehab On mirtazapine Not much appetite still Discussed high calorie density foods and supplements

## 2017-08-08 NOTE — Assessment & Plan Note (Signed)
No recent angina on isosorbide Continue beta blocker and ACEI

## 2017-08-08 NOTE — Assessment & Plan Note (Signed)
Now home from rehab Continues with home therapy Daughter living with her for support Ongoing pain--but more in right hip! Now on actonel again

## 2017-08-08 NOTE — Assessment & Plan Note (Signed)
Done with the vancomycin Diarrhea is gone now

## 2017-08-09 DIAGNOSIS — E785 Hyperlipidemia, unspecified: Secondary | ICD-10-CM | POA: Diagnosis not present

## 2017-08-09 DIAGNOSIS — M549 Dorsalgia, unspecified: Secondary | ICD-10-CM | POA: Diagnosis not present

## 2017-08-09 DIAGNOSIS — S72142D Displaced intertrochanteric fracture of left femur, subsequent encounter for closed fracture with routine healing: Secondary | ICD-10-CM | POA: Diagnosis not present

## 2017-08-09 DIAGNOSIS — I2510 Atherosclerotic heart disease of native coronary artery without angina pectoris: Secondary | ICD-10-CM | POA: Diagnosis not present

## 2017-08-09 DIAGNOSIS — I10 Essential (primary) hypertension: Secondary | ICD-10-CM | POA: Diagnosis not present

## 2017-08-09 NOTE — Telephone Encounter (Signed)
Christina [name] returned call and I spoke with her and informed her of physician's message. She understood and had no additional questions at this time. Nothing further is needed

## 2017-08-09 NOTE — Telephone Encounter (Signed)
Left message on machine to call back to [facility] to inform of Dr. Karle Starch previous message

## 2017-08-09 NOTE — Telephone Encounter (Signed)
That is fine 

## 2017-08-10 ENCOUNTER — Telehealth: Payer: Self-pay

## 2017-08-10 DIAGNOSIS — S72142D Displaced intertrochanteric fracture of left femur, subsequent encounter for closed fracture with routine healing: Secondary | ICD-10-CM | POA: Diagnosis not present

## 2017-08-10 DIAGNOSIS — I2510 Atherosclerotic heart disease of native coronary artery without angina pectoris: Secondary | ICD-10-CM | POA: Diagnosis not present

## 2017-08-10 DIAGNOSIS — M549 Dorsalgia, unspecified: Secondary | ICD-10-CM | POA: Diagnosis not present

## 2017-08-10 DIAGNOSIS — I10 Essential (primary) hypertension: Secondary | ICD-10-CM | POA: Diagnosis not present

## 2017-08-10 DIAGNOSIS — E785 Hyperlipidemia, unspecified: Secondary | ICD-10-CM | POA: Diagnosis not present

## 2017-08-10 NOTE — Telephone Encounter (Signed)
Christina [name] PT with [facility] left v/m requesting verbal orders for [facility] PT 2 x a week for 8 weeks.

## 2017-08-10 NOTE — Telephone Encounter (Signed)
Spoke to [employer]. Verbal order given.

## 2017-08-10 NOTE — Telephone Encounter (Signed)
That is fine 

## 2017-08-13 DIAGNOSIS — E785 Hyperlipidemia, unspecified: Secondary | ICD-10-CM | POA: Diagnosis not present

## 2017-08-13 DIAGNOSIS — S72142D Displaced intertrochanteric fracture of left femur, subsequent encounter for closed fracture with routine healing: Secondary | ICD-10-CM | POA: Diagnosis not present

## 2017-08-13 DIAGNOSIS — M549 Dorsalgia, unspecified: Secondary | ICD-10-CM | POA: Diagnosis not present

## 2017-08-13 DIAGNOSIS — I10 Essential (primary) hypertension: Secondary | ICD-10-CM | POA: Diagnosis not present

## 2017-08-13 DIAGNOSIS — I2510 Atherosclerotic heart disease of native coronary artery without angina pectoris: Secondary | ICD-10-CM | POA: Diagnosis not present

## 2017-08-14 DIAGNOSIS — I10 Essential (primary) hypertension: Secondary | ICD-10-CM | POA: Diagnosis not present

## 2017-08-14 DIAGNOSIS — E785 Hyperlipidemia, unspecified: Secondary | ICD-10-CM | POA: Diagnosis not present

## 2017-08-14 DIAGNOSIS — I2510 Atherosclerotic heart disease of native coronary artery without angina pectoris: Secondary | ICD-10-CM | POA: Diagnosis not present

## 2017-08-14 DIAGNOSIS — M549 Dorsalgia, unspecified: Secondary | ICD-10-CM | POA: Diagnosis not present

## 2017-08-14 DIAGNOSIS — S72142D Displaced intertrochanteric fracture of left femur, subsequent encounter for closed fracture with routine healing: Secondary | ICD-10-CM | POA: Diagnosis not present

## 2017-08-15 DIAGNOSIS — S72142D Displaced intertrochanteric fracture of left femur, subsequent encounter for closed fracture with routine healing: Secondary | ICD-10-CM | POA: Diagnosis not present

## 2017-08-15 DIAGNOSIS — E785 Hyperlipidemia, unspecified: Secondary | ICD-10-CM | POA: Diagnosis not present

## 2017-08-15 DIAGNOSIS — I10 Essential (primary) hypertension: Secondary | ICD-10-CM | POA: Diagnosis not present

## 2017-08-15 DIAGNOSIS — M549 Dorsalgia, unspecified: Secondary | ICD-10-CM | POA: Diagnosis not present

## 2017-08-15 DIAGNOSIS — I2510 Atherosclerotic heart disease of native coronary artery without angina pectoris: Secondary | ICD-10-CM | POA: Diagnosis not present

## 2017-08-16 DIAGNOSIS — I2510 Atherosclerotic heart disease of native coronary artery without angina pectoris: Secondary | ICD-10-CM | POA: Diagnosis not present

## 2017-08-16 DIAGNOSIS — I10 Essential (primary) hypertension: Secondary | ICD-10-CM | POA: Diagnosis not present

## 2017-08-16 DIAGNOSIS — E785 Hyperlipidemia, unspecified: Secondary | ICD-10-CM | POA: Diagnosis not present

## 2017-08-16 DIAGNOSIS — S72142D Displaced intertrochanteric fracture of left femur, subsequent encounter for closed fracture with routine healing: Secondary | ICD-10-CM | POA: Diagnosis not present

## 2017-08-16 DIAGNOSIS — M549 Dorsalgia, unspecified: Secondary | ICD-10-CM | POA: Diagnosis not present

## 2017-08-21 ENCOUNTER — Telehealth: Payer: Self-pay

## 2017-08-21 DIAGNOSIS — S72142D Displaced intertrochanteric fracture of left femur, subsequent encounter for closed fracture with routine healing: Secondary | ICD-10-CM | POA: Diagnosis not present

## 2017-08-21 DIAGNOSIS — I10 Essential (primary) hypertension: Secondary | ICD-10-CM | POA: Diagnosis not present

## 2017-08-21 DIAGNOSIS — I2510 Atherosclerotic heart disease of native coronary artery without angina pectoris: Secondary | ICD-10-CM | POA: Diagnosis not present

## 2017-08-21 DIAGNOSIS — E785 Hyperlipidemia, unspecified: Secondary | ICD-10-CM | POA: Diagnosis not present

## 2017-08-21 DIAGNOSIS — M549 Dorsalgia, unspecified: Secondary | ICD-10-CM | POA: Diagnosis not present

## 2017-08-21 NOTE — Telephone Encounter (Signed)
Christina [name] with [facility] left v/m; pts daughter requesting referral for [facility] social worker to help pt with options of community resources.Please advise.

## 2017-08-22 DIAGNOSIS — I10 Essential (primary) hypertension: Secondary | ICD-10-CM | POA: Diagnosis not present

## 2017-08-22 DIAGNOSIS — I2510 Atherosclerotic heart disease of native coronary artery without angina pectoris: Secondary | ICD-10-CM | POA: Diagnosis not present

## 2017-08-22 DIAGNOSIS — M549 Dorsalgia, unspecified: Secondary | ICD-10-CM | POA: Diagnosis not present

## 2017-08-22 DIAGNOSIS — E785 Hyperlipidemia, unspecified: Secondary | ICD-10-CM | POA: Diagnosis not present

## 2017-08-22 DIAGNOSIS — S72142D Displaced intertrochanteric fracture of left femur, subsequent encounter for closed fracture with routine healing: Secondary | ICD-10-CM | POA: Diagnosis not present

## 2017-08-22 NOTE — Telephone Encounter (Signed)
That is fine 

## 2017-08-22 NOTE — Telephone Encounter (Signed)
Left detailed message with verbal approval for [facility]

## 2017-08-23 DIAGNOSIS — I2510 Atherosclerotic heart disease of native coronary artery without angina pectoris: Secondary | ICD-10-CM | POA: Diagnosis not present

## 2017-08-23 DIAGNOSIS — E785 Hyperlipidemia, unspecified: Secondary | ICD-10-CM | POA: Diagnosis not present

## 2017-08-23 DIAGNOSIS — S72142D Displaced intertrochanteric fracture of left femur, subsequent encounter for closed fracture with routine healing: Secondary | ICD-10-CM | POA: Diagnosis not present

## 2017-08-23 DIAGNOSIS — M549 Dorsalgia, unspecified: Secondary | ICD-10-CM | POA: Diagnosis not present

## 2017-08-23 DIAGNOSIS — I10 Essential (primary) hypertension: Secondary | ICD-10-CM | POA: Diagnosis not present

## 2017-08-24 ENCOUNTER — Encounter: Payer: Self-pay | Admitting: *Deleted

## 2017-08-24 ENCOUNTER — Emergency Department: Payer: Medicare Other

## 2017-08-24 ENCOUNTER — Inpatient Hospital Stay
Admission: EM | Admit: 2017-08-24 | Discharge: 2017-08-27 | DRG: 281 | Disposition: A | Discharge status: Home / Self Care | Payer: Medicare Other | Attending: Internal Medicine | Admitting: Internal Medicine

## 2017-08-24 DIAGNOSIS — Z7982 Long term (current) use of aspirin: Secondary | ICD-10-CM | POA: Diagnosis not present

## 2017-08-24 DIAGNOSIS — K219 Gastro-esophageal reflux disease without esophagitis: Secondary | ICD-10-CM | POA: Diagnosis not present

## 2017-08-24 DIAGNOSIS — I214 Non-ST elevation (NSTEMI) myocardial infarction: Principal | ICD-10-CM | POA: Diagnosis present

## 2017-08-24 DIAGNOSIS — M1991 Primary osteoarthritis, unspecified site: Secondary | ICD-10-CM | POA: Diagnosis not present

## 2017-08-24 DIAGNOSIS — I2510 Atherosclerotic heart disease of native coronary artery without angina pectoris: Secondary | ICD-10-CM | POA: Diagnosis not present

## 2017-08-24 DIAGNOSIS — Z66 Do not resuscitate: Secondary | ICD-10-CM | POA: Diagnosis not present

## 2017-08-24 DIAGNOSIS — M549 Dorsalgia, unspecified: Secondary | ICD-10-CM | POA: Diagnosis not present

## 2017-08-24 DIAGNOSIS — G629 Polyneuropathy, unspecified: Secondary | ICD-10-CM | POA: Diagnosis present

## 2017-08-24 DIAGNOSIS — M25519 Pain in unspecified shoulder: Secondary | ICD-10-CM | POA: Diagnosis present

## 2017-08-24 DIAGNOSIS — G8929 Other chronic pain: Secondary | ICD-10-CM | POA: Diagnosis present

## 2017-08-24 DIAGNOSIS — I471 Supraventricular tachycardia: Secondary | ICD-10-CM | POA: Diagnosis not present

## 2017-08-24 DIAGNOSIS — Z955 Presence of coronary angioplasty implant and graft: Secondary | ICD-10-CM

## 2017-08-24 DIAGNOSIS — Z79899 Other long term (current) drug therapy: Secondary | ICD-10-CM

## 2017-08-24 DIAGNOSIS — R296 Repeated falls: Secondary | ICD-10-CM | POA: Diagnosis present

## 2017-08-24 DIAGNOSIS — R54 Age-related physical debility: Secondary | ICD-10-CM | POA: Diagnosis present

## 2017-08-24 DIAGNOSIS — E785 Hyperlipidemia, unspecified: Secondary | ICD-10-CM | POA: Diagnosis not present

## 2017-08-24 DIAGNOSIS — R0789 Other chest pain: Secondary | ICD-10-CM | POA: Diagnosis not present

## 2017-08-24 DIAGNOSIS — Z888 Allergy status to other drugs, medicaments and biological substances status: Secondary | ICD-10-CM | POA: Diagnosis not present

## 2017-08-24 DIAGNOSIS — Z8249 Family history of ischemic heart disease and other diseases of the circulatory system: Secondary | ICD-10-CM

## 2017-08-24 DIAGNOSIS — I10 Essential (primary) hypertension: Secondary | ICD-10-CM | POA: Diagnosis not present

## 2017-08-24 DIAGNOSIS — I472 Ventricular tachycardia: Secondary | ICD-10-CM | POA: Diagnosis not present

## 2017-08-24 DIAGNOSIS — I252 Old myocardial infarction: Secondary | ICD-10-CM

## 2017-08-24 DIAGNOSIS — E875 Hyperkalemia: Secondary | ICD-10-CM | POA: Diagnosis not present

## 2017-08-24 DIAGNOSIS — S72142D Displaced intertrochanteric fracture of left femur, subsequent encounter for closed fracture with routine healing: Secondary | ICD-10-CM | POA: Diagnosis not present

## 2017-08-24 DIAGNOSIS — M810 Age-related osteoporosis without current pathological fracture: Secondary | ICD-10-CM | POA: Diagnosis not present

## 2017-08-24 DIAGNOSIS — D72829 Elevated white blood cell count, unspecified: Secondary | ICD-10-CM | POA: Diagnosis not present

## 2017-08-24 DIAGNOSIS — I739 Peripheral vascular disease, unspecified: Secondary | ICD-10-CM | POA: Diagnosis not present

## 2017-08-24 DIAGNOSIS — I371 Nonrheumatic pulmonary valve insufficiency: Secondary | ICD-10-CM | POA: Diagnosis not present

## 2017-08-24 DIAGNOSIS — I4891 Unspecified atrial fibrillation: Secondary | ICD-10-CM | POA: Diagnosis not present

## 2017-08-24 DIAGNOSIS — I255 Ischemic cardiomyopathy: Secondary | ICD-10-CM | POA: Diagnosis not present

## 2017-08-24 DIAGNOSIS — I480 Paroxysmal atrial fibrillation: Secondary | ICD-10-CM | POA: Diagnosis not present

## 2017-08-24 DIAGNOSIS — R1013 Epigastric pain: Secondary | ICD-10-CM | POA: Diagnosis not present

## 2017-08-24 DIAGNOSIS — R079 Chest pain, unspecified: Secondary | ICD-10-CM | POA: Diagnosis not present

## 2017-08-24 LAB — CBC
HCT: 39.4 % (ref 35.0–47.0)
HEMOGLOBIN: 13.1 g/dL (ref 12.0–16.0)
MCH: 30.2 pg (ref 26.0–34.0)
MCHC: 33.2 g/dL (ref 32.0–36.0)
MCV: 90.8 fL (ref 80.0–100.0)
Platelets: 275 10*3/uL (ref 150–440)
RBC: 4.34 MIL/uL (ref 3.80–5.20)
RDW: 14.3 % (ref 11.5–14.5)
WBC: 20.6 10*3/uL — ABNORMAL HIGH (ref 3.6–11.0)

## 2017-08-24 LAB — COMPREHENSIVE METABOLIC PANEL
ALBUMIN: 3.5 g/dL (ref 3.5–5.0)
ALK PHOS: 156 U/L — AB (ref 38–126)
ALT: 73 U/L — AB (ref 14–54)
AST: 148 U/L — ABNORMAL HIGH (ref 15–41)
Anion gap: 11 (ref 5–15)
BUN: 20 mg/dL (ref 6–20)
CALCIUM: 10.2 mg/dL (ref 8.9–10.3)
CO2: 24 mmol/L (ref 22–32)
CREATININE: 1.04 mg/dL — AB (ref 0.44–1.00)
Chloride: 105 mmol/L (ref 101–111)
GFR calc non Af Amer: 44 mL/min — ABNORMAL LOW (ref >60)
GFR, EST AFRICAN AMERICAN: 51 mL/min — AB (ref >60)
Glucose, Bld: 172 mg/dL — ABNORMAL HIGH (ref 65–99)
Potassium: 5.4 mmol/L — ABNORMAL HIGH (ref 3.5–5.1)
SODIUM: 140 mmol/L (ref 135–145)
Total Bilirubin: 0.8 mg/dL (ref 0.3–1.2)
Total Protein: 7.5 g/dL (ref 6.5–8.1)

## 2017-08-24 LAB — TROPONIN I: TROPONIN I: 1.1 ng/mL — AB (ref <0.03)

## 2017-08-24 LAB — MAGNESIUM: MAGNESIUM: 2.3 mg/dL (ref 1.7–2.4)

## 2017-08-24 MED ORDER — POLYETHYLENE GLYCOL 3350 17 G PO PACK: 17.0000 g | PACK | Freq: Every day | ORAL | Status: DC | PRN

## 2017-08-24 MED ORDER — ISOSORBIDE MONONITRATE ER 30 MG PO TB24
30.0000 mg | ORAL_TABLET | Freq: Every day | ORAL | Status: DC
  Administered 2017-08-24 – 2017-08-27 (×4): 30 mg via ORAL
  Filled 2017-08-24 (×4): qty 1

## 2017-08-24 MED ORDER — ASPIRIN EC 81 MG PO TBEC
81.0000 mg | DELAYED_RELEASE_TABLET | Freq: Every day | ORAL | Status: DC
  Administered 2017-08-25 – 2017-08-27 (×3): 81 mg via ORAL
  Filled 2017-08-24 (×3): qty 1

## 2017-08-24 MED ORDER — NITROGLYCERIN 0.4 MG SL SUBL
0.4000 mg | SUBLINGUAL_TABLET | SUBLINGUAL | Status: DC | PRN
Start: 2017-08-24 — End: 2017-08-27

## 2017-08-24 MED ORDER — ACETAMINOPHEN 325 MG PO TABS: 650.0000 mg | ORAL_TABLET | Freq: Four times a day (QID) | ORAL | Status: DC | PRN

## 2017-08-24 MED ORDER — ACETAMINOPHEN 650 MG RE SUPP: 650.0000 mg | Freq: Four times a day (QID) | RECTAL | Status: DC | PRN

## 2017-08-24 MED ORDER — MIRTAZAPINE 15 MG PO TABS
7.5000 mg | ORAL_TABLET | Freq: Every day | ORAL | Status: DC
  Administered 2017-08-24 – 2017-08-26 (×3): 7.5 mg via ORAL
  Filled 2017-08-24 (×3): qty 1

## 2017-08-24 MED ORDER — LISINOPRIL 10 MG PO TABS
10.0000 mg | ORAL_TABLET | Freq: Every day | ORAL | Status: DC
  Administered 2017-08-25 – 2017-08-27 (×3): 10 mg via ORAL
  Filled 2017-08-24 (×3): qty 1

## 2017-08-24 MED ORDER — ONDANSETRON HCL 4 MG/2ML IJ SOLN: 4.0000 mg | Freq: Four times a day (QID) | INTRAMUSCULAR | Status: DC | PRN

## 2017-08-24 MED ORDER — ALBUTEROL SULFATE (2.5 MG/3ML) 0.083% IN NEBU: 2.5000 mg | INHALATION_SOLUTION | RESPIRATORY_TRACT | Status: DC | PRN

## 2017-08-24 MED ORDER — ASPIRIN 81 MG PO CHEW: 324.0000 mg | CHEWABLE_TABLET | Freq: Once | ORAL | Status: DC

## 2017-08-24 MED ORDER — ENOXAPARIN SODIUM 40 MG/0.4ML ~~LOC~~ SOLN: 40.0000 mg | SUBCUTANEOUS | Status: DC

## 2017-08-24 MED ORDER — ENOXAPARIN SODIUM 40 MG/0.4ML ~~LOC~~ SOLN
30.0000 mg | SUBCUTANEOUS | Status: DC
  Administered 2017-08-24: 30 mg via SUBCUTANEOUS
  Filled 2017-08-24: qty 0.4

## 2017-08-24 MED ORDER — CARVEDILOL 3.125 MG PO TABS
3.1250 mg | ORAL_TABLET | Freq: Two times a day (BID) | ORAL | Status: DC
  Administered 2017-08-24 – 2017-08-25 (×2): 3.125 mg via ORAL
  Filled 2017-08-24 (×2): qty 1

## 2017-08-24 MED ORDER — TRAMADOL HCL 50 MG PO TABS: 50.0000 mg | ORAL_TABLET | Freq: Four times a day (QID) | ORAL | Status: DC | PRN

## 2017-08-24 MED ORDER — ROSUVASTATIN CALCIUM 5 MG PO TABS
5.0000 mg | ORAL_TABLET | Freq: Every day | ORAL | Status: DC
  Administered 2017-08-25 – 2017-08-27 (×3): 5 mg via ORAL
  Filled 2017-08-24 (×3): qty 1

## 2017-08-24 MED ORDER — LIDOCAINE 5 % EX PTCH
1.0000 | MEDICATED_PATCH | Freq: Every day | CUTANEOUS | Status: DC
Start: 2017-08-24 — End: 2017-08-27
  Administered 2017-08-24 – 2017-08-25 (×2): 1 via TRANSDERMAL
  Filled 2017-08-24 (×4): qty 1

## 2017-08-24 MED ORDER — VENLAFAXINE HCL ER 37.5 MG PO CP24
37.5000 mg | ORAL_CAPSULE | Freq: Every day | ORAL | Status: DC
  Administered 2017-08-25 – 2017-08-27 (×3): 37.5 mg via ORAL
  Filled 2017-08-24 (×3): qty 1

## 2017-08-24 MED ORDER — ONDANSETRON HCL 4 MG PO TABS: 4.0000 mg | ORAL_TABLET | Freq: Four times a day (QID) | ORAL | Status: DC | PRN

## 2017-08-24 NOTE — ED Notes (Signed)
EDP at bedside, pts daughter states pt was complaining of a "knot" in her stomach, daughter gave her Prilosec pta thinking that would help

## 2017-08-24 NOTE — ED Notes (Signed)
Dr. York CeriseForbach notified of critical trop of 1.10

## 2017-08-24 NOTE — Progress Notes (Signed)
advance care planning  Discussed with patient, daughter and granddaughter at bedside. Discussed regarding non-ST elevation MI, advanced age and prognosis. Patient patient has had a hip fracture in July. Was walking independently and living alone prior to that. Now she is dependent on her daughter to take her out and ambulates with a walker. She is limited with her going outside the house. Unhappy about the situation. Now with the non-ST elevation MI she has chosen to pursue conservative treatment without any aggressive cardiac catheterization. Discussed regarding CODE STATUS and patient is DO NOT RESUSCITATE. Order is entered.   Time spent 20 minutes.

## 2017-08-24 NOTE — ED Provider Notes (Signed)
[facility] Emergency Department Provider Note  ____________________________________________   First MD Initiated Contact with Patient 08/24/17 1347     (approximate)  I have reviewed the triage vital signs and the nursing notes.   HISTORY  Chief Complaint Chest Pain    HPI Christina [name] is a 83 y.o. female With extensive past medical history but who is still active at her age in spite of a recent hip fracture and all her chronic medical issues.  She presents by EMS for evaluation of upper abdominal versus chest pain.  Reportedly she had a good day for the last 2 days.  yesterday evening her daughter was working with her on her dentures and the patient became acutely nauseated.  She thought maybe it was because of working with the dentures and having things in her mouth so her daughter gave her Prilosec. the symptom persisted with some burning in her upper abdomen radiating into her chest but she was able to get a good night sleep.  The symptoms are still present this morning with some nausea and epigastric burning.  She does have a history of acid reflux but the onset of it was fairly acutely yesterday evening and it has been persistent.  She received aspirin and Zofran prior to arrival and reportedly the patient was orthostatic but has been awake and alert and able to give a good history.  Initially EMS was concerned about some ST elevation and frequent PVCs on the EKG and the twelve-lead was sent to Dr. Clayborn Bigness at the catheter lab, but he did not fill it met criteria.  I reviewed the strip and agree that it does not meet criteria for STEMI.  The patient denies chest pain and points at the lower part of her chest or upper part of her abdomen and states that the burning discomfort is there.  It feels a little bit better than it did previously.  Nothing in particular makes the patient's symptoms better nor worse.    She denies shortness of breath, lower abdominal pain,  and dysuria.she says it is mild.   Past Medical History:  Diagnosis Date  . CAD (coronary artery disease)    a. S/P previous MI;  b. 04/2007 Cath/PCI: Taxus DES' to mid/distal RCA and RPDA as well as Ramus;  c. myoview 8/09: EF 78%, normal perfusion  . Carotid stenosis    Bilateral, mild to moderate  . Chronic back pain   . Chronic shoulder pain   . Hyperlipidemia    Intolerant of many statins  . Hypertension   . Mild mitral and aortic regurgitation    a. 11/2011 Echo: EF 55-65%, No RWMA, Gr 1 DD, Mild AI/MR.  Marland Kitchen Neuropathy   . Osteoarthritis   . Osteoporosis   . Pelvic fracture ([place]) 6/16  . Peripheral vascular disease [facility])     Patient Active Problem List   Diagnosis Date Noted  . S/p left hip fracture 08/08/2017  . Clostridium difficile colitis 08/08/2017  . Vitamin D deficiency 07/23/2017  . Vitamin B12 deficiency 07/23/2017  . Hip fracture ([place]) 06/18/2017  . Mild malnutrition ([place]) 03/06/2016  . Mood disorder ([place]) 09/06/2015  . Back pain without sciatica 07/26/2015  . Osteoarthritis, multiple sites 03/04/2015  . Advanced directives, counseling/discussion 09/04/2014  . Mild cognitive impairment 09/04/2014  . Routine general medical examination at a health care facility 08/26/2013  . Carotid arterial disease ([place])   . Hyperlipidemia 12/04/2007  . Essential hypertension 06/04/2007  . Coronary artery  disease with angina pectoris ([place]) 06/04/2007  . Osteoporosis 06/04/2007    Past Surgical History:  Procedure Laterality Date  . ADENOSINE MYOVIEW  01/2006   No ischemia, EF 70%  . BREAST BIOPSY     Bilateral  . CAROTID U/S  10/2006   0-39% bilaterally  . CATARACT EXTRACTION  09/2006   Right  . CORONARY ANGIOPLASTY  12/2002   Medical rx only  . CORONARY ANGIOPLASTY  01/2006   LAD/PAD disease  . CORONARY ANGIOPLASTY  03/2007   3 vessel disease  . CORONARY STENT PLACEMENT  2002   MI, [facility]  . CORONARY STENT PLACEMENT  04/2007   RCA/LCX multiple, [facility]  . CP  ADMIT  09/2005   Stress myoview negative  . FEMORAL HERNIA REPAIR  05/2007   Incarcerated, right  . FRACTURE SURGERY  03/2009   Left leg  . INTRAMEDULLARY (IM) NAIL INTERTROCHANTERIC Right 05/18/2016   Procedure: INTRAMEDULLARY (IM) NAIL INTERTROCHANTRIC;  Surgeon: Thornton Park, MD;  Location: [facility] ORS;  Service: Orthopedics;  Laterality: Right;  . INTRAMEDULLARY (IM) NAIL INTERTROCHANTERIC Left 06/18/2017   Procedure: INTRAMEDULLARY (IM) NAIL INTERTROCHANTRIC;  Surgeon: Earnestine Leys, MD;  Location: [facility] ORS;  Service: Orthopedics;  Laterality: Left;  . NM MYOVIEW LTD  11/2004   Stress, negative; NL EF  . VESICOVAGINAL FISTULA CLOSURE W/ TAH      Prior to Admission medications   Medication Sig Start Date End Date Taking? Authorizing Provider  aspirin EC 81 MG tablet Take 81 mg by mouth daily.    Yes [provider]  carvedilol (COREG) 3.125 MG tablet TAKE 1 TABLET BY MOUTH TWO  TIMES DAILY 01/26/17  Yes Venia Carbon, MD  Cholecalciferol (VITAMIN D3) 2000 units capsule Take 2,000 Units by mouth daily.   Yes [provider]  isosorbide mononitrate (IMDUR) 30 MG 24 hr tablet TAKE 1 TABLET BY MOUTH  DAILY 12/07/16  Yes Wellington Hampshire, MD  lisinopril (PRINIVIL,ZESTRIL) 10 MG tablet TAKE 1 TABLET BY MOUTH  DAILY 01/26/17  Yes Venia Carbon, MD  MEGARED OMEGA-3 KRILL OIL 500 MG CAPS Take 1 capsule by mouth daily.    Yes [provider]  mirtazapine (REMERON) 7.5 MG tablet Take 7.5 mg by mouth at bedtime.    Yes [provider]  Multiple Vitamins-Minerals (MULTIVITAL) tablet Take 1 tablet by mouth daily.   Yes [provider]  rosuvastatin (CRESTOR) 5 MG tablet Take 5 mg by mouth daily. 06/01/17  Yes [provider]  venlafaxine XR (EFFEXOR-XR) 37.5 MG 24 hr capsule Take 37.5 mg by mouth daily with breakfast.   Yes [provider]  acetaminophen (TYLENOL 8 HOUR) 650 MG CR tablet Take 650 mg by mouth every 8 (eight) hours as  needed for pain. Do not exceed 3000 mg / 24 hrs of acetaminophen. Consider all sources    [provider]  ibandronate (BONIVA) 150 MG tablet Take 150 mg by mouth every 30 (thirty) days. Take in the morning with a full glass of water, on an empty stomach, and do not take anything else by mouth or lie down for the next 30 min.    [provider]  Lidocaine (ASPERCREME LIDOCAINE) 4 % PTCH Apply 1 patch topically daily. Apply to lower back/sacrum for pain. Remove after 12 hours.    [provider]  meclizine (ANTIVERT) 25 MG tablet Take 1 tablet (25 mg total) by mouth 3 (three) times daily as needed for dizziness. Patient not taking: Reported on 08/24/2017 05/01/16  Wellington Hampshire, MD  nitroGLYCERIN (NITROSTAT) 0.4 MG SL tablet Place 1 tablet (0.4 mg total) under the tongue every 5 (five) minutes as needed for chest pain. 08/23/16   Wellington Hampshire, MD  traMADol (ULTRAM) 50 MG tablet Take by mouth every 6 (six) hours as needed.    [provider]    Allergies Atorvastatin; Diltiazem hcl; Dye fdc red [red dye]; Gemfibrozil; Niacin; and Sulfasalazine  Family History  Problem Relation Age of Onset  . Pulmonary embolism Mother   . Heart disease Father   . Heart disease Sister   . Cancer Brother   . Heart disease Sister     Social History Social History  Substance Use Topics  . Smoking status: Never Smoker  . Smokeless tobacco: Never Used  . Alcohol use No    Review of Systems Constitutional: No fever/chills Eyes: No visual changes. ENT: No sore throat. Cardiovascular: lower chest versus upper abdominal burning pain Respiratory: Denies shortness of breath. Gastrointestinal: lower chest versus upper abdominal burning pain.  nausea, no vomiting.  No diarrhea.  No constipation. Genitourinary: Negative for dysuria. Musculoskeletal: Negative for neck pain.  Negative for back pain. Integumentary: Negative for rash. Neurological: Negative for headaches,  focal weakness or numbness.   ____________________________________________   PHYSICAL EXAM:  VITAL SIGNS: ED Triage Vitals [08/24/17 1341]  Enc Vitals Group     BP (!) 142/83     Pulse Rate 89     Resp 18     Temp 98.3 F (36.8 C)     Temp Source Oral     SpO2 97 %     Weight 45.4 kg (100 lb)     Height 1.549 m (5' 1" )     Head Circumference      Peak Flow      Pain Score 3     Pain Loc      Pain Edu?      Excl. in [place]?     Constitutional: Alert and oriented. Elderly but generally Well appearing and in no acute distress. Eyes: Conjunctivae are normal.  Head: Atraumatic. Nose: No congestion/rhinnorhea. Mouth/Throat: Mucous membranes are moist. Neck: No stridor.  No meningeal signs.   Cardiovascular: Normal rate, regular rhythm. Good peripheral circulation. Grossly normal heart sounds. Respiratory: Normal respiratory effort.  No retractions. Lungs CTAB. Gastrointestinal: Soft and nontender. No distention.  Musculoskeletal: No lower extremity tenderness nor edema. No gross deformities of extremities. Neurologic:  Normal speech and language. No gross focal neurologic deficits are appreciated.  Skin:  Skin is warm, dry and intact. No rash noted. Psychiatric: Mood and affect are normal. Speech and behavior are normal. ____________________________________________   LABS (all labs ordered are listed, but only abnormal results are displayed)  Labs Reviewed  CBC - Abnormal; Notable for the following:       Result Value   WBC 20.6 (*)    All other components within normal limits  COMPREHENSIVE METABOLIC PANEL - Abnormal; Notable for the following:    Potassium 5.4 (*)    Glucose, Bld 172 (*)    Creatinine, Ser 1.04 (*)    AST 148 (*)    ALT 73 (*)    Alkaline Phosphatase 156 (*)    GFR calc non Af Amer 44 (*)    GFR calc Af Amer 51 (*)    All other components within normal limits  TROPONIN I - Abnormal; Notable for the following:    Troponin I 1.10 (*)    All other  components within normal limits  MAGNESIUM   ____________________________________________  EKG  ED ECG REPORT I, Evann Erazo, the attending physician, personally viewed and interpreted this ECG.  Date: 08/24/2017 EKG Time: 13:34 Rate: 84 Rhythm: normal sinus rhythm with artifact and a 4 beat run of an ectopic rhythm / PVCs QRS Axis: normal Intervals: normal ST/T Wave abnormalities: Non-specific ST segment / T-wave changes, but no evidence of acute ischemia. Narrative Interpretation: no evidence of acute ischemia   ____________________________________________  RADIOLOGY   Dg Chest Portable 1 View  Result Date: 08/24/2017 CLINICAL DATA:  Chest pain EXAM: PORTABLE CHEST 1 VIEW COMPARISON:  05/17/2016 FINDINGS: No acute consolidation or pleural effusion. Stable mild cardiomegaly. Minimal atherosclerotic calcifications at the arch. No pneumothorax. IMPRESSION: 1. No radiographic evidence for acute cardiopulmonary abnormality 2. Stable mild cardiomegaly Electronically Signed   By: Donavan Foil M.D.   On: 08/24/2017 14:19    ____________________________________________   PROCEDURES  Critical Care performed: No   Procedure(s) performed:   Procedures   ____________________________________________   INITIAL IMPRESSION / ASSESSMENT AND PLAN / ED COURSE  Pertinent labs & imaging results that were available during my care of the patient were reviewed by me and considered in my medical decision making (see chart for details).  my assumption is that the patient most likely is having some epigastric pain related to acid reflux.  However given her extensive cardiac history we will evaluate with a troponin, in addition to a chest x-ray and standard lab work.  At this point she is asymptomatic, alert and oriented, in no acute distress.  We will monitor carefully.  Her EKG, will not completely normal, also does not meet STEMI criteria.   Clinical Course as of Aug 24 1546  Fri Aug 24, 2017  1545 Troponin is elevated at greater than 1.  I checked on the patient again and she feels fine at this time.  Dr. Fletcher Anon is her cardiologist.  She takes a baby aspirin.  Given her age and comorbidities I am giving her a full dose aspirin but will hold off on heparin even though it does appear she is having an NSTEMI.  I will defer to cardiology at hospital's recommendations.  She is not having ongoing pain at this time with an EKG that does not meet STEMI criteria.  I updated the patient and family and discussed the case with the hospitalist and they will admit.  [CF]    Clinical Course User Index [CF] Hinda Kehr, MD    ____________________________________________  FINAL CLINICAL IMPRESSION(S) / ED DIAGNOSES  Final diagnoses:  NSTEMI (non-ST elevated myocardial infarction) ([place])  Atypical chest pain     MEDICATIONS GIVEN DURING THIS VISIT:  Medications  aspirin chewable tablet 324 mg (not administered)     NEW OUTPATIENT MEDICATIONS STARTED DURING THIS VISIT:  New Prescriptions   No medications on file    Modified Medications   No medications on file    Discontinued Medications   ONDANSETRON (ZOFRAN) 8 MG TABLET    Take 8 mg by mouth every 6 (six) hours as needed for nausea or vomiting.   PROBIOTIC PRODUCT (RISA-BID PROBIOTIC) TABS    Take 1 tablet by mouth 2 (two) times daily.     Note:  This document was prepared using Dragon voice recognition software and may include unintentional dictation errors.    Hinda Kehr, MD 08/24/17 [phone]

## 2017-08-24 NOTE — ED Triage Notes (Signed)
Pt arrives via EMS from home, states chest burning that began last night worsening today, EMS gave 243 ASa and 4 mg zofran pta, per EMS pt BP was orthostatic, upon arrival pt awake and alert, states 3/10 burning pain in her epigastric area

## 2017-08-24 NOTE — ED Notes (Signed)
Pt resting in bed, aware of pending admission, family at bedside

## 2017-08-24 NOTE — H&P (Signed)
[facility] [place] [facility]   PATIENT NAME: Christina [name]    MR#:  [id number]  DATE OF BIRTH:  1920-03-12  DATE OF ADMISSION:  08/24/2017  PRIMARY CARE PHYSICIAN: Karie Schwalbe, MD   REQUESTING/REFERRING PHYSICIAN: Dr. York Cerise  CHIEF COMPLAINT:   Chief Complaint  Patient presents with  . Chest Pain    HISTORY OF PRESENT ILLNESS:  Christina [name]  is a 83 y.o. female with a known history of CAD, hypertension, recent right hip fracture who ambulates with a walker at baseline present it to the hospital after she complained of some lower chest and epigastric pain. Initially EMS was concerned that patient was having an ST elevation MI. The [facility] with cardiology was contacted who thought the EKG did not show MI. Later patient's troponin returned at 1.1. At this time patient and family after discussing with Dr. York Cerise in the emergency room have decided regarding conservative medical management and not moving ahead with catheterization. No heparin drip. She still complains of mild left chest pain. No shortness of breath or vomiting.  PAST MEDICAL HISTORY:   Past Medical History:  Diagnosis Date  . CAD (coronary artery disease)    a. S/P previous MI;  b. 04/2007 Cath/PCI: Taxus DES' to mid/distal RCA and RPDA as well as Ramus;  c. myoview 8/09: EF 78%, normal perfusion  . Carotid stenosis    Bilateral, mild to moderate  . Chronic back pain   . Chronic shoulder pain   . Hyperlipidemia    Intolerant of many statins  . Hypertension   . Mild mitral and aortic regurgitation    a. 11/2011 Echo: EF 55-65%, No RWMA, Gr 1 DD, Mild AI/MR.  Marland Kitchen Neuropathy   . Osteoarthritis   . Osteoporosis   . Pelvic fracture ([facility]) 6/16  . Peripheral vascular disease ([facility])     PAST SURGICAL HISTORY:   Past Surgical History:  Procedure Laterality Date  . ADENOSINE MYOVIEW  01/2006   No ischemia, EF 70%  . BREAST BIOPSY     Bilateral  . CAROTID U/S  10/2006   0-39% bilaterally  .  CATARACT EXTRACTION  09/2006   Right  . CORONARY ANGIOPLASTY  12/2002   Medical rx only  . CORONARY ANGIOPLASTY  01/2006   LAD/PAD disease  . CORONARY ANGIOPLASTY  03/2007   3 vessel disease  . CORONARY STENT PLACEMENT  2002   MI, [facility]  . CORONARY STENT PLACEMENT  04/2007   RCA/LCX multiple, [facility]  . CP ADMIT  09/2005   Stress myoview negative  . FEMORAL HERNIA REPAIR  05/2007   Incarcerated, right  . FRACTURE SURGERY  03/2009   Left leg  . INTRAMEDULLARY (IM) NAIL INTERTROCHANTERIC Right 05/18/2016   Procedure: INTRAMEDULLARY (IM) NAIL INTERTROCHANTRIC;  Surgeon: [name], MD;  Location: [facility] ORS;  Service: Orthopedics;  Laterality: Right;  . INTRAMEDULLARY (IM) NAIL INTERTROCHANTERIC Left 06/18/2017   Procedure: INTRAMEDULLARY (IM) NAIL INTERTROCHANTRIC;  Surgeon: Deeann Saint, MD;  Location: [facility] ORS;  Service: Orthopedics;  Laterality: Left;  . NM MYOVIEW LTD  11/2004   Stress, negative; NL EF  . VESICOVAGINAL FISTULA CLOSURE W/ TAH      SOCIAL HISTORY:   Social History  Substance Use Topics  . Smoking status: Never Smoker  . Smokeless tobacco: Never Used  . Alcohol use No    FAMILY HISTORY:   Family History  Problem Relation Age of Onset  . Pulmonary embolism Mother   . Heart disease Father   .  Heart disease Sister   . Cancer Brother   . Heart disease Sister     DRUG ALLERGIES:   Allergies  Allergen Reactions  . Atorvastatin Other (See Comments)    Reaction:  Blisters on feet   . Diltiazem Hcl Other (See Comments)    Reaction:  Unknown   . Dye Fdc Red [Red Dye] Other (See Comments)    Reaction:  Unknown   . Gemfibrozil Other (See Comments)    Reaction:  Unknown   . Niacin Other (See Comments)    Reaction:  Unknown   . Sulfasalazine Rash    REVIEW OF SYSTEMS:   Review of Systems  Constitutional: Positive for malaise/fatigue. Negative for chills, fever and weight loss.  HENT: Negative for hearing loss and nosebleeds.   Eyes: Negative for  blurred vision, double vision and pain.  Respiratory: Negative for cough, hemoptysis, sputum production, shortness of breath and wheezing.   Cardiovascular: Positive for chest pain. Negative for palpitations, orthopnea and leg swelling.  Gastrointestinal: Negative for abdominal pain, constipation, diarrhea, nausea and vomiting.  Genitourinary: Negative for dysuria and hematuria.  Musculoskeletal: Negative for back pain, falls and myalgias.  Skin: Negative for rash.  Neurological: Negative for dizziness, tremors, sensory change, speech change, focal weakness, seizures and headaches.  Endo/Heme/Allergies: Does not bruise/bleed easily.  Psychiatric/Behavioral: Negative for depression and memory loss. The patient is not nervous/anxious.    MEDICATIONS AT HOME:   Prior to Admission medications   Medication Sig Start Date End Date Taking? Authorizing Provider  aspirin EC 81 MG tablet Take 81 mg by mouth daily.    Yes [provider]  carvedilol (COREG) 3.125 MG tablet TAKE 1 TABLET BY MOUTH TWO  TIMES DAILY 01/26/17  Yes Karie SchwalbeLetvak, Richard I, MD  Cholecalciferol (VITAMIN D3) 2000 units capsule Take 2,000 Units by mouth daily.   Yes [provider]  isosorbide mononitrate (IMDUR) 30 MG 24 hr tablet TAKE 1 TABLET BY MOUTH  DAILY 12/07/16  Yes Iran OuchArida, Muhammad A, MD  lisinopril (PRINIVIL,ZESTRIL) 10 MG tablet TAKE 1 TABLET BY MOUTH  DAILY 01/26/17  Yes Karie SchwalbeLetvak, Richard I, MD  MEGARED OMEGA-3 KRILL OIL 500 MG CAPS Take 1 capsule by mouth daily.    Yes [provider]  mirtazapine (REMERON) 7.5 MG tablet Take 7.5 mg by mouth at bedtime.    Yes [provider]  Multiple Vitamins-Minerals (MULTIVITAL) tablet Take 1 tablet by mouth daily.   Yes [provider]  rosuvastatin (CRESTOR) 5 MG tablet Take 5 mg by mouth daily. 06/01/17  Yes [provider]  venlafaxine XR (EFFEXOR-XR) 37.5 MG 24 hr capsule Take 37.5 mg by mouth daily with breakfast.   Yes [provider]  acetaminophen (TYLENOL 8 HOUR) 650 MG CR tablet Take 650 mg by mouth every 8 (eight) hours as needed for pain. Do not exceed 3000 mg / 24 hrs of acetaminophen. Consider all sources    [provider]  ibandronate (BONIVA) 150 MG tablet Take 150 mg by mouth every 30 (thirty) days. Take in the morning with a full glass of water, on an empty stomach, and do not take anything else by mouth or lie down for the next 30 min.    [provider]  Lidocaine (ASPERCREME LIDOCAINE) 4 % PTCH Apply 1 patch topically daily. Apply to lower back/sacrum for pain. Remove after 12 hours.    [provider]  meclizine (ANTIVERT) 25 MG tablet Take 1 tablet (25 mg total) by mouth 3 (three)  times daily as needed for dizziness. Patient not taking: Reported on 08/24/2017 05/01/16   Iran Ouch, MD  nitroGLYCERIN (NITROSTAT) 0.4 MG SL tablet Place 1 tablet (0.4 mg total) under the tongue every 5 (five) minutes as needed for chest pain. 08/23/16   Iran Ouch, MD  traMADol (ULTRAM) 50 MG tablet Take by mouth every 6 (six) hours as needed.    [provider]     VITAL SIGNS:  Blood pressure (!) 157/89, pulse 95, temperature 98.3 F (36.8 C), temperature source Oral, resp. rate 19, height  (1.549 m), weight 45.4 kg (100 lb), SpO2 96 %.  PHYSICAL EXAMINATION:  Physical Exam  GENERAL:  83 y.o.-year-old patient lying in the bed with no acute distress.  EYES: Pupils equal, round, reactive to light and accommodation. No scleral icterus. Extraocular muscles intact.  HEENT: Head atraumatic, normocephalic. Oropharynx and nasopharynx clear. No oropharyngeal erythema, moist oral mucosa  NECK:  Supple, no jugular venous distention. No thyroid enlargement, no tenderness.  LUNGS: Normal breath sounds bilaterally, no wheezing, rales, rhonchi. No use of accessory muscles of respiration.  CARDIOVASCULAR: S1, S2 normal. No murmurs, rubs, or gallops.  ABDOMEN: Soft, nontender,  nondistended. Bowel sounds present. No organomegaly or mass.  EXTREMITIES: No pedal edema, cyanosis, or clubbing. + 2 pedal & radial pulses b/l.   NEUROLOGIC: Cranial nerves II through XII are intact. No focal Motor or sensory deficits appreciated b/l PSYCHIATRIC: The patient is alert and oriented x 3. Good affect.  SKIN: No obvious rash, lesion, or ulcer.   LABORATORY PANEL:   CBC  Recent Labs Lab 08/24/17 1428  WBC 20.6*  HGB 13.1  HCT 39.4  PLT 275   ------------------------------------------------------------------------------------------------------------------  Chemistries   Recent Labs Lab 08/24/17 1457  NA 140  K 5.4*  CL 105  CO2 24  GLUCOSE 172*  BUN 20  CREATININE 1.04*  CALCIUM 10.2  MG 2.3  AST 148*  ALT 73*  ALKPHOS 156*  BILITOT 0.8   ------------------------------------------------------------------------------------------------------------------  Cardiac Enzymes  Recent Labs Lab 08/24/17 1457  TROPONINI 1.10*   ------------------------------------------------------------------------------------------------------------------  RADIOLOGY:  Dg Chest Portable 1 View  Result Date: 08/24/2017 CLINICAL DATA:  Chest pain EXAM: PORTABLE CHEST 1 VIEW COMPARISON:  05/17/2016 FINDINGS: No acute consolidation or pleural effusion. Stable mild cardiomegaly. Minimal atherosclerotic calcifications at the arch. No pneumothorax. IMPRESSION: 1. No radiographic evidence for acute cardiopulmonary abnormality 2. Stable mild cardiomegaly Electronically Signed   By: Jasmine Pang M.D.   On: 08/24/2017 14:19   IMPRESSION AND PLAN:   * NSTEMI After discussing with the ER physician patient and family have decided regarding conservative treatment with medical management. No cardiac catheterization. I agree with this approach. Patient will be admitted overnight for observation. Will not check a repeat troponin. Consult patient's cardiologist Dr. Kirke Corin. Patient does seem  to have fall risk. Walks with a walker. Had a fall 1 month back. Would hesitate adding Plavix.  * leukocytosis.etiology unclear No signs of infection. Likely stress reaction. We will repeat labs in the morning. Orders entered.  * hyperkalemia. Mild. Hold lisinopril.  * hypertension. Continue Coreg and Imdur. Lisinopril held due to hyperkalemia  * DVT prophylaxis with Lovenox  All the records are reviewed and case discussed with ED provider. Management plans discussed with the patient, family and they are in agreement.  CODE STATUS: DNR  TOTAL TIME TAKING CARE OF THIS PATIENT: 40 minutes.   Milagros Loll R M.D on 08/24/2017 at 4:07 PM  Between 7am to  6pm - Pager - [phone]  After 6pm go to [email or web address] - password [email or web address]  [facility]  Office  [phone]  CC: Primary care physician; Karie Schwalbe, MD  Note: This dictation was prepared with Dragon dictation along with smaller phrase technology. Any transcriptional errors that result from this process are unintentional.

## 2017-08-25 ENCOUNTER — Inpatient Hospital Stay (HOSPITAL_COMMUNITY)
Admit: 2017-08-25 | Discharge: 2017-08-25 | Disposition: A | Discharge status: Home / Self Care | Payer: Medicare Other | Attending: Internal Medicine | Admitting: Internal Medicine

## 2017-08-25 ENCOUNTER — Encounter: Payer: Self-pay | Admitting: Internal Medicine

## 2017-08-25 DIAGNOSIS — E785 Hyperlipidemia, unspecified: Secondary | ICD-10-CM

## 2017-08-25 DIAGNOSIS — Z8249 Family history of ischemic heart disease and other diseases of the circulatory system: Secondary | ICD-10-CM | POA: Diagnosis not present

## 2017-08-25 DIAGNOSIS — I480 Paroxysmal atrial fibrillation: Secondary | ICD-10-CM | POA: Diagnosis not present

## 2017-08-25 DIAGNOSIS — M810 Age-related osteoporosis without current pathological fracture: Secondary | ICD-10-CM | POA: Diagnosis present

## 2017-08-25 DIAGNOSIS — I214 Non-ST elevation (NSTEMI) myocardial infarction: Principal | ICD-10-CM

## 2017-08-25 DIAGNOSIS — I371 Nonrheumatic pulmonary valve insufficiency: Secondary | ICD-10-CM

## 2017-08-25 DIAGNOSIS — Z888 Allergy status to other drugs, medicaments and biological substances status: Secondary | ICD-10-CM | POA: Diagnosis not present

## 2017-08-25 DIAGNOSIS — D72829 Elevated white blood cell count, unspecified: Secondary | ICD-10-CM

## 2017-08-25 DIAGNOSIS — I471 Supraventricular tachycardia: Secondary | ICD-10-CM

## 2017-08-25 DIAGNOSIS — M1991 Primary osteoarthritis, unspecified site: Secondary | ICD-10-CM | POA: Diagnosis present

## 2017-08-25 DIAGNOSIS — M25519 Pain in unspecified shoulder: Secondary | ICD-10-CM | POA: Diagnosis present

## 2017-08-25 DIAGNOSIS — Z7982 Long term (current) use of aspirin: Secondary | ICD-10-CM | POA: Diagnosis not present

## 2017-08-25 DIAGNOSIS — I472 Ventricular tachycardia: Secondary | ICD-10-CM | POA: Diagnosis not present

## 2017-08-25 DIAGNOSIS — K219 Gastro-esophageal reflux disease without esophagitis: Secondary | ICD-10-CM | POA: Diagnosis present

## 2017-08-25 DIAGNOSIS — I739 Peripheral vascular disease, unspecified: Secondary | ICD-10-CM | POA: Diagnosis present

## 2017-08-25 DIAGNOSIS — I10 Essential (primary) hypertension: Secondary | ICD-10-CM | POA: Diagnosis present

## 2017-08-25 DIAGNOSIS — M549 Dorsalgia, unspecified: Secondary | ICD-10-CM | POA: Diagnosis present

## 2017-08-25 DIAGNOSIS — E875 Hyperkalemia: Secondary | ICD-10-CM | POA: Diagnosis present

## 2017-08-25 DIAGNOSIS — I2510 Atherosclerotic heart disease of native coronary artery without angina pectoris: Secondary | ICD-10-CM | POA: Diagnosis present

## 2017-08-25 DIAGNOSIS — R296 Repeated falls: Secondary | ICD-10-CM | POA: Diagnosis present

## 2017-08-25 DIAGNOSIS — G629 Polyneuropathy, unspecified: Secondary | ICD-10-CM | POA: Diagnosis present

## 2017-08-25 DIAGNOSIS — R54 Age-related physical debility: Secondary | ICD-10-CM | POA: Diagnosis present

## 2017-08-25 DIAGNOSIS — Z66 Do not resuscitate: Secondary | ICD-10-CM | POA: Diagnosis present

## 2017-08-25 DIAGNOSIS — G8929 Other chronic pain: Secondary | ICD-10-CM | POA: Diagnosis present

## 2017-08-25 DIAGNOSIS — I255 Ischemic cardiomyopathy: Secondary | ICD-10-CM | POA: Diagnosis present

## 2017-08-25 LAB — TROPONIN I
Troponin I: 13.51 ng/mL (ref <0.03)
Troponin I: 12.02 ng/mL (ref <0.03)

## 2017-08-25 LAB — CBC WITH DIFFERENTIAL/PLATELET
Basophils Absolute: 0.1 10*3/uL (ref 0–0.1)
Basophils Relative: 1 %
EOS PCT: 1 %
Eosinophils Absolute: 0.1 10*3/uL (ref 0–0.7)
HCT: 36.1 % (ref 35.0–47.0)
Hemoglobin: 12.1 g/dL (ref 12.0–16.0)
LYMPHS ABS: 1.7 10*3/uL (ref 1.0–3.6)
LYMPHS PCT: 14 %
MCH: 30.6 pg (ref 26.0–34.0)
MCHC: 33.6 g/dL (ref 32.0–36.0)
MCV: 91.2 fL (ref 80.0–100.0)
MONO ABS: 1.1 10*3/uL — AB (ref 0.2–0.9)
Monocytes Relative: 9 %
Neutro Abs: 9.5 10*3/uL — ABNORMAL HIGH (ref 1.4–6.5)
Neutrophils Relative %: 77 %
PLATELETS: 298 10*3/uL (ref 150–440)
RBC: 3.95 MIL/uL (ref 3.80–5.20)
RDW: 14 % (ref 11.5–14.5)
WBC: 12.5 10*3/uL — ABNORMAL HIGH (ref 3.6–11.0)

## 2017-08-25 LAB — PROTIME-INR
INR: 1.21
PROTHROMBIN TIME: 15.2 s (ref 11.4–15.2)

## 2017-08-25 LAB — BASIC METABOLIC PANEL
Anion gap: 9 (ref 5–15)
BUN: 25 mg/dL — ABNORMAL HIGH (ref 6–20)
CALCIUM: 9.7 mg/dL (ref 8.9–10.3)
CO2: 27 mmol/L (ref 22–32)
Chloride: 104 mmol/L (ref 101–111)
Creatinine, Ser: 1 mg/dL (ref 0.44–1.00)
GFR calc Af Amer: 54 mL/min — ABNORMAL LOW (ref >60)
GFR, EST NON AFRICAN AMERICAN: 46 mL/min — AB (ref >60)
GLUCOSE: 117 mg/dL — AB (ref 65–99)
Potassium: 4 mmol/L (ref 3.5–5.1)
Sodium: 140 mmol/L (ref 135–145)

## 2017-08-25 LAB — HEPARIN LEVEL (UNFRACTIONATED): Heparin Unfractionated: 0.11 IU/mL — ABNORMAL LOW (ref 0.30–0.70)

## 2017-08-25 LAB — ECHOCARDIOGRAM COMPLETE
Height: 61 in
Weight: 1529.6 oz

## 2017-08-25 LAB — APTT: aPTT: 44 seconds — ABNORMAL HIGH (ref 24–36)

## 2017-08-25 MED ORDER — HEPARIN BOLUS VIA INFUSION
2600.0000 [IU] | Freq: Once | INTRAVENOUS | Status: AC
  Administered 2017-08-25: 2600 [IU] via INTRAVENOUS
  Filled 2017-08-25: qty 2600

## 2017-08-25 MED ORDER — HEPARIN (PORCINE) IN NACL 100-0.45 UNIT/ML-% IJ SOLN
800.0000 [IU]/h | INTRAMUSCULAR | Status: DC
  Administered 2017-08-26 (×2): 800 [IU]/h via INTRAVENOUS
  Filled 2017-08-25: qty 250

## 2017-08-25 MED ORDER — HEPARIN (PORCINE) IN NACL 100-0.45 UNIT/ML-% IJ SOLN
12.0000 [IU]/kg/h | INTRAMUSCULAR | Status: DC
  Administered 2017-08-25: 12 [IU]/kg/h via INTRAVENOUS
  Filled 2017-08-25: qty 250

## 2017-08-25 MED ORDER — HEPARIN BOLUS VIA INFUSION
1300.0000 [IU] | Freq: Once | INTRAVENOUS | Status: AC
  Administered 2017-08-25: 1300 [IU] via INTRAVENOUS
  Filled 2017-08-25: qty 1300

## 2017-08-25 MED ORDER — METOPROLOL TARTRATE 5 MG/5ML IV SOLN: 5.0000 mg | Freq: Once | INTRAVENOUS | Status: DC

## 2017-08-25 MED ORDER — CARVEDILOL 6.25 MG PO TABS
6.2500 mg | ORAL_TABLET | Freq: Two times a day (BID) | ORAL | Status: DC
  Administered 2017-08-26 – 2017-08-27 (×2): 6.25 mg via ORAL
  Filled 2017-08-25 (×4): qty 1

## 2017-08-25 NOTE — Progress Notes (Signed)
ANTICOAGULATION CONSULT NOTE - Initial Consult  Pharmacy Consult for heparin gtt Indication: chest pain/ACS  Allergies  Allergen Reactions  . Bee Venom Rash  . Plavix [Clopidogrel] Rash  . Atorvastatin Other (See Comments)    Reaction:  Blisters on feet   . Diltiazem Hcl Other (See Comments)    Reaction:  Unknown   . Dye Fdc Red [Red Dye] Other (See Comments)    Reaction:  Unknown   . Gemfibrozil Other (See Comments)    Reaction:  Unknown   . Niacin Other (See Comments)    Reaction:  Unknown   . Sulfasalazine Rash    Patient Measurements: Height:  (154.9 cm) Weight: 95 lb 9.6 oz (43.4 kg) IBW/kg (Calculated) : 47.8 Heparin Dosing Weight: 44kg  Vital Signs: Temp: 98.5 F (36.9 C) (09/08 0803) Temp Source: Oral (09/08 0803) BP: 120/62 (09/08 0803) Pulse Rate: 114 (09/08 0803)  Labs:  Recent Labs  08/24/17 1428 08/24/17 1457 08/25/17 0538  HGB 13.1  --  12.1  HCT 39.4  --  36.1  PLT 275  --  298  CREATININE  --  1.04* 1.00  TROPONINI  --  1.10*  --     Estimated Creatinine Clearance: 23.1 mL/min (by C-G formula based on SCr of 1 mg/dL).   Medical History: Past Medical History:  Diagnosis Date  . CAD (coronary artery disease)    a. S/P previous MI;  b. 04/2007 Cath/PCI: Taxus DES' to mid/distal RCA and RPDA as well as Ramus;  c. myoview 8/09: EF 78%, normal perfusion  . Carotid stenosis    Bilateral, mild to moderate  . Chronic back pain   . Chronic shoulder pain   . Hyperlipidemia    Intolerant of many statins  . Hypertension   . Mild mitral and aortic regurgitation    a. 11/2011 Echo: EF 55-65%, No RWMA, Gr 1 DD, Mild AI/MR.  Marland Kitchen Neuropathy   . Osteoarthritis   . Osteoporosis   . Pelvic fracture (HCC) 6/16  . Peripheral vascular disease (HCC)     Medications:  Prescriptions Prior to Admission  Medication Sig Dispense Refill Last Dose  . aspirin EC 81 MG tablet Take 81 mg by mouth daily.    08/24/2017 at 0800  . carvedilol (COREG) 3.125 MG  tablet TAKE 1 TABLET BY MOUTH TWO  TIMES DAILY 180 tablet 3 08/24/2017 at 0800  . Cholecalciferol (VITAMIN D3) 2000 units capsule Take 2,000 Units by mouth daily.   08/24/2017 at 0800  . isosorbide mononitrate (IMDUR) 30 MG 24 hr tablet TAKE 1 TABLET BY MOUTH  DAILY 90 tablet 3 08/24/2017 at 0800  . lisinopril (PRINIVIL,ZESTRIL) 10 MG tablet TAKE 1 TABLET BY MOUTH  DAILY 90 tablet 3 08/24/2017 at 0800  . MEGARED OMEGA-3 KRILL OIL 500 MG CAPS Take 1 capsule by mouth daily.    08/24/2017 at 0800  . mirtazapine (REMERON) 7.5 MG tablet Take 7.5 mg by mouth at bedtime.    08/23/2017 at 2100  . Multiple Vitamins-Minerals (MULTIVITAL) tablet Take 1 tablet by mouth daily.   08/24/2017 at 0800  . rosuvastatin (CRESTOR) 5 MG tablet Take 5 mg by mouth daily.   08/24/2017 at 0800  . venlafaxine XR (EFFEXOR-XR) 37.5 MG 24 hr capsule Take 37.5 mg by mouth daily with breakfast.   08/24/2017 at 0800  . acetaminophen (TYLENOL 8 HOUR) 650 MG CR tablet Take 650 mg by mouth every 8 (eight) hours as needed for pain. Do not exceed 3000 mg / 24  hrs of acetaminophen. Consider all sources   prn at prn  . ibandronate (BONIVA) 150 MG tablet Take 150 mg by mouth every 30 (thirty) days. Take in the morning with a full glass of water, on an empty stomach, and do not take anything else by mouth or lie down for the next 30 min.   Not Taking at Unknown time  . Lidocaine (ASPERCREME LIDOCAINE) 4 % PTCH Apply 1 patch topically daily. Apply to lower back/sacrum for pain. Remove after 12 hours.   Taking  . meclizine (ANTIVERT) 25 MG tablet Take 1 tablet (25 mg total) by mouth 3 (three) times daily as needed for dizziness. (Patient not taking: Reported on 08/24/2017) 30 tablet 3 Not Taking at Unknown time  . nitroGLYCERIN (NITROSTAT) 0.4 MG SL tablet Place 1 tablet (0.4 mg total) under the tongue every 5 (five) minutes as needed for chest pain. 25 tablet 2 prn at prn  . traMADol (ULTRAM) 50 MG tablet Take by mouth every 6 (six) hours as needed.   prn at prn    Scheduled:  . aspirin EC  81 mg Oral Daily  . carvedilol  3.125 mg Oral BID WC  . heparin  2,600 Units Intravenous Once  . isosorbide mononitrate  30 mg Oral Daily  . lidocaine  1 patch Transdermal Daily  . lisinopril  10 mg Oral Daily  . mirtazapine  7.5 mg Oral QHS  . rosuvastatin  5 mg Oral Daily  . venlafaxine XR  37.5 mg Oral Q breakfast   Infusions:  . heparin     PRN: acetaminophen **OR** acetaminophen, albuterol, nitroGLYCERIN, ondansetron **OR** ondansetron (ZOFRAN) IV, polyethylene glycol, traMADol Anti-infectives    None      Assessment: 83 year old woman requiring heparin gtt for acs  Goal of Therapy:  Heparin level 0.3-0.7 units/ml Monitor platelets by anticoagulation protocol: Yes   Plan:  Give 2600 units bolus x 1 Start heparin infusion at 500 units/hr Check anti-Xa level in 8 hours and daily while on heparin Continue to monitor H&H and platelets  Gerre PebblesGarrett Anayelli Lai 08/25/2017,10:42 AM

## 2017-08-25 NOTE — Consult Note (Signed)
Cardiology Consultation:   Patient ID: Christina [name]; [id number]; May 25, 1920   Admit date: 08/24/2017 Date of Consult: 08/25/2017  Primary Care Provider: Karie SchwalbeLetvak, Richard I, MD Primary Cardiologist: Lorine BearsMuhammad Arida, MD Primary Electrophysiologist:  None   Patient Profile:   Christina RenshawRuth J [name] is a 83 y.o. female with a hx of Coronary artery disease status post PCI to the ramus and RCA in 2008, peripheral vascular disease, hypertension, hyperlipidemia, and multiple falls resulting in hip and pelvic fractures (most recently 06/2017) who is being seen today for the evaluation of NSTEMI at the request of Dr. Elpidio AnisSudini.  History of Present Illness:   Christina [name] was in her usual state of health until 2 nights ago. She began experiencing epigastric discomfort and nausea. She took sublingual nitroglycerin, which has helped with this discomfort in the past, but did not notice any improvement. Yesterday morning, she continued to be nauseated and threw up at least once. After telling her family about these symptoms, she was brought to the emergency department. Initial EKG was concerning for possible STEMI, though after reviewing the EKG with Dr. Ulyess MortKahle would and discussing the situation with the patient and her family, decision was made to forego cardiac catheterization. Initial troponin was elevated at 1.1; repeat troponin has not been obtained.  This morning, Christina [name] reports feeling well. She noted some epigastric pain last night but notes that this has since resolved. She has not had any nausea but continues to have a poor appetite. She denies shortness of breath, palpitations, lightheadedness, orthopnea, PND, and edema.  Past Medical History:  Diagnosis Date  . CAD (coronary artery disease)    a. S/P previous MI;  b. 04/2007 Cath/PCI: Taxus DES' to mid/distal RCA and RPDA as well as Ramus;  c. myoview 8/09: EF 78%, normal perfusion  . Carotid stenosis    Bilateral, mild to moderate  . Chronic back pain   .  Chronic shoulder pain   . Hyperlipidemia    Intolerant of many statins  . Hypertension   . Mild mitral and aortic regurgitation    a. 11/2011 Echo: EF 55-65%, No RWMA, Gr 1 DD, Mild AI/MR.  Marland Kitchen. Neuropathy   . Osteoarthritis   . Osteoporosis   . Pelvic fracture (HCC) 6/16  . Peripheral vascular disease [facility](HCC)     Past Surgical History:  Procedure Laterality Date  . ADENOSINE MYOVIEW  01/2006   No ischemia, EF 70%  . BREAST BIOPSY     Bilateral  . CAROTID U/S  10/2006   0-39% bilaterally  . CATARACT EXTRACTION  09/2006   Right  . CORONARY ANGIOPLASTY  12/2002   Medical rx only  . CORONARY ANGIOPLASTY  01/2006   LAD/PAD disease  . CORONARY ANGIOPLASTY  03/2007   3 vessel disease  . CORONARY STENT PLACEMENT  2002   MI, [facility]  . CORONARY STENT PLACEMENT  04/2007   RCA/LCX multiple, [facility]  . CP ADMIT  09/2005   Stress myoview negative  . FEMORAL HERNIA REPAIR  05/2007   Incarcerated, right  . FRACTURE SURGERY  03/2009   Left leg  . INTRAMEDULLARY (IM) NAIL INTERTROCHANTERIC Right 05/18/2016   Procedure: INTRAMEDULLARY (IM) NAIL INTERTROCHANTRIC;  Surgeon: Juanell FairlyKevin Krasinski, MD;  Location: [facility] ORS;  Service: Orthopedics;  Laterality: Right;  . INTRAMEDULLARY (IM) NAIL INTERTROCHANTERIC Left 06/18/2017   Procedure: INTRAMEDULLARY (IM) NAIL INTERTROCHANTRIC;  Surgeon: Deeann SaintMiller, Howard, MD;  Location: [facility] ORS;  Service: Orthopedics;  Laterality: Left;  . NM MYOVIEW LTD  11/2004  Stress, negative; NL EF  . VESICOVAGINAL FISTULA CLOSURE W/ TAH         Inpatient Medications: Scheduled Meds: . aspirin EC  81 mg Oral Daily  . carvedilol  6.25 mg Oral BID WC  . heparin  2,600 Units Intravenous Once  . isosorbide mononitrate  30 mg Oral Daily  . lidocaine  1 patch Transdermal Daily  . lisinopril  10 mg Oral Daily  . mirtazapine  7.5 mg Oral QHS  . rosuvastatin  5 mg Oral Daily  . venlafaxine XR  37.5 mg Oral Q breakfast   Continuous Infusions: . heparin     PRN  Meds: acetaminophen **OR** acetaminophen, albuterol, nitroGLYCERIN, ondansetron **OR** ondansetron (ZOFRAN) IV, polyethylene glycol, traMADol  Allergies:    Allergies  Allergen Reactions  . Bee Venom Rash  . Plavix [Clopidogrel] Rash  . Atorvastatin Other (See Comments)    Reaction:  Blisters on feet   . Diltiazem Hcl Other (See Comments)    Reaction:  Unknown   . Dye Fdc Red [Red Dye] Other (See Comments)    Reaction:  Unknown   . Gemfibrozil Other (See Comments)    Reaction:  Unknown   . Niacin Other (See Comments)    Reaction:  Unknown   . Sulfasalazine Rash    Social History:   Social History   Social History  . Marital status: Widowed    Spouse name: N/A  . Number of children: 3  . Years of education: 12   Occupational History  .      retired from [employer]   Social History Main Topics  . Smoking status: Never Smoker  . Smokeless tobacco: Never Used  . Alcohol use No  . Drug use: No  . Sexual activity: Not on file   Other Topics Concern  . Not on file   Social History Narrative   Widowed 2005 after long time care for husband after stroke   Remarried but seperated   Has 3 children      No formal living will   Daughter [name] should be her health care POA   Has DNR already   No feeding tube if cognitively unaware    Family History:    Family History  Problem Relation Age of Onset  . Pulmonary embolism Mother   . Heart disease Father   . Heart disease Sister   . Cancer Brother   . Heart disease Sister      ROS:  Review of Systems  Constitution: Negative.  HENT: Positive for congestion.   Eyes: Negative.   Cardiovascular: Negative.   Respiratory: Negative.   Endocrine: Negative.   Hematologic/Lymphatic: Negative.   Musculoskeletal: Positive for joint pain.  Gastrointestinal: Positive for abdominal pain, nausea and vomiting. Negative for hematemesis, hematochezia and melena.  Genitourinary: Negative.   Neurological: Negative.    Psychiatric/Behavioral: Negative.   Allergic/Immunologic: Negative.     Physical Exam/Data:   Vitals:   08/24/17 1739 08/24/17 1945 08/25/17 0446 08/25/17 0803  BP: (!) 161/94 140/86 111/61 120/62  Pulse: (!) 104 95 96 (!) 114  Resp: Temp: 98 F (36.7 C) 98.3 F (36.8 C) 98.1 F (36.7 C) 98.5 F (36.9 C)  TempSrc: Oral Oral Oral Oral  SpO2: 97% 99% 94% 94%  Weight: 97 lb (44 kg)  95 lb 9.6 oz (43.4 kg)   Height:  (1.549 m)       Intake/Output Summary (Last 24 hours) at 08/25/17 1119 Last data  filed at 08/25/17 1018  Gross per 24 hour  Intake              120 ml  Output              275 ml  Net             -155 ml   Filed Weights   08/24/17 1341 08/24/17 1739 08/25/17 0446  Weight: 100 lb (45.4 kg) 97 lb (44 kg) 95 lb 9.6 oz (43.4 kg)   Body mass index is 18.06 kg/m.  General:  Frail, elderly woman lying in bed. HEENT: normal Lymph: no adenopathy Neck: no JVD or HJR. Endocrine:  No thryomegaly Vascular: No carotid bruit. 2+ radial pulses bilaterally. 1+ pedal pulses bilaterally. Cardiac:  normal S1, S2; RRR; no murmur, rubs, or gallops Lungs:  clear to auscultation bilaterally, no wheezing, rhonchi or rales  Abd: soft, nontender, no hepatomegaly  Ext: no edema Musculoskeletal:  No deformities, BUE and BLE strength normal and equal Skin: warm and dry  Neuro:  CNs 2-12 intact, no focal abnormalities noted Psych:  Normal affect   EKG:  The EKG was personally reviewed and demonstrates:  Normal sinus rhythm with a brief run of nonsustained ventricular tachycardia as well as PVCs, right axis deviation, and 1 mm of ST segment elevation isolated to lead V4. Of note, all beats in leads V1 through V3 are ventricular in origin, limiting evaluation for ischemic changes. Telemetry:  Telemetry was personally reviewed and demonstrates:  Normal sinus rhythm and sinus tachycardia with PACs, atrial runs, and narrow complex tachycardia that is irregularly irregular  suspicious for paroxysmal atrial fibrillation.  Relevant CV Studies: Echo (07/02/15): Normal LV size with mild focal basal hypertrophy of the septum. LVEF 60-65% with normal wall motion and grade 1 diastolic dysfunction. Trivial aortic regurgitation. Mild left atrial enlargement. Normal RV size and function. Borderline elevated pulmonary artery pressure.  Laboratory Data:  Chemistry Recent Labs Lab 08/24/17 1457 08/25/17 0538  NA 140 140  K 5.4* 4.0  CL 105 104  CO2 24 27  GLUCOSE 172* 117*  BUN 20 25*  CREATININE 1.04* 1.00  CALCIUM 10.2 9.7  GFRNONAA 44* 46*  GFRAA 51* 54*  ANIONGAP 11 9     Recent Labs Lab 08/24/17 1457  PROT 7.5  ALBUMIN 3.5  AST 148*  ALT 73*  ALKPHOS 156*  BILITOT 0.8   Hematology Recent Labs Lab 08/24/17 1428 08/25/17 0538  WBC 20.6* 12.5*  RBC 4.34 3.95  HGB 13.1 12.1  HCT 39.4 36.1  MCV 90.8 91.2  MCH 30.2 30.6  MCHC 33.2 33.6  RDW 14.3 14.0  PLT 275 298   Cardiac Enzymes Recent Labs Lab 08/24/17 1457  TROPONINI 1.10*   No results for input(s): TROPIPOC in the last 168 hours.  BNPNo results for input(s): BNP, PROBNP in the last 168 hours.  DDimer No results for input(s): DDIMER in the last 168 hours.  Radiology/Studies:  Dg Chest Portable 1 View  Result Date: 08/24/2017 CLINICAL DATA:  Chest pain EXAM: PORTABLE CHEST 1 VIEW COMPARISON:  05/17/2016 FINDINGS: No acute consolidation or pleural effusion. Stable mild cardiomegaly. Minimal atherosclerotic calcifications at the arch. No pneumothorax. IMPRESSION: 1. No radiographic evidence for acute cardiopulmonary abnormality 2. Stable mild cardiomegaly Electronically Signed   By: Jasmine Pang M.D.   On: 08/24/2017 14:19    Assessment and Plan:  NSTEMI Patient presents with epigastric pain and nausea, which may reflect her anginal equivalent. Admission EKG is difficult  to interpret due to NSVT obscuring some of the precordial leads. There are subtle ST elevations in lead V4.  Decision was made not to pursue aggressive therapy last night. This morning, Ms. Apsey is asymptomatic. Only troponin thus far is the initial assay performed in the ED, which was elevated at 1.1. I discussed invasive versus conservative treatment options with Ms. Wurth and her daughter. We have agreed to forego cardiac catheterization, given Ms. Othman's age, frailty, and multiple falls. We discussed medical treatment options and have decided to treat with heparin infusion and to optimize antianginal therapy.  Heparin infusion (ACS protocol) 48 hours.  Repeat EKG and obtain transthoracic echocardiogram.  Current troponin until it has peaked, then stop.  Continue low-dose aspirin, isosorbide mononitrate, and carvedilol. We will increase carvedilol (see details below).  Continue rosuvastatin 5 mg daily.  Paroxysmal atrial fibrillation and atrial tachycardia Telemetry shows episodes of atrial tachycardia and irregularly irregular narrow complex tachycardia suspicious for brief runs of atrial fibrillation. Ms. Laramee was asymptomatic. Her CHADSVASC score is at least 5. However, due to frailty and multiple falls, she is not a good candidate for long-term anticoagulation.  Heparin infusion for ACS, as above. Long-term, I would continue with aspirin for stroke prophylaxis given history of multiple falls.  Increase carvedilol to 6.25 mg twice a day.  Obtain transthoracic echocardiogram.  Hypertension Blood pressure is normal.  Increase carvedilol to 6.25 mg twice a day with close blood pressure monitoring.  Continue current dose of lisinopril.  Hyperlipidemia (LDL goal < 70)  LDL 54 in July.  Continue rosuvastatin 5 mg daily.  Leukocytosis Unclear etiology, but could be stress response in the setting of acute MI. WBC trending down today.  Continue monitoring; further workup per internal medicine service.   Signed, Yvonne Kendall, MD  08/25/2017 11:19 AM

## 2017-08-25 NOTE — Progress Notes (Signed)
*  PRELIMINARY RESULTS* Echocardiogram 2D Echocardiogram has been performed.  Christina [name] 08/25/2017, 2:06 PM

## 2017-08-25 NOTE — Progress Notes (Signed)
[facility] [place] [facility]   PATIENT NAME: Christina [name]    MR#:  [id number]  DATE OF BIRTH:  10-17-1920  SUBJECTIVE:  CHIEF COMPLAINT:   Chief Complaint  Patient presents with  . Chest Pain   No CP today. No SOB  REVIEW OF SYSTEMS:    Review of Systems  Constitutional: Positive for malaise/fatigue. Negative for chills and fever.  HENT: Negative for sore throat.   Eyes: Negative for blurred vision, double vision and pain.  Respiratory: Negative for cough, hemoptysis, shortness of breath and wheezing.   Cardiovascular: Negative for chest pain, palpitations, orthopnea and leg swelling.  Gastrointestinal: Negative for abdominal pain, constipation, diarrhea, heartburn, nausea and vomiting.  Genitourinary: Negative for dysuria and hematuria.  Musculoskeletal: Negative for back pain and joint pain.  Skin: Negative for rash.  Neurological: Negative for sensory change, speech change, focal weakness and headaches.  Endo/Heme/Allergies: Does not bruise/bleed easily.  Psychiatric/Behavioral: Negative for depression. The patient is not nervous/anxious.     DRUG ALLERGIES:   Allergies  Allergen Reactions  . Bee Venom Rash  . Plavix [Clopidogrel] Rash  . Atorvastatin Other (See Comments)    Reaction:  Blisters on feet   . Diltiazem Hcl Other (See Comments)    Reaction:  Unknown   . Dye Fdc Red [Red Dye] Other (See Comments)    Reaction:  Unknown   . Gemfibrozil Other (See Comments)    Reaction:  Unknown   . Niacin Other (See Comments)    Reaction:  Unknown   . Sulfasalazine Rash    VITALS:  Blood pressure 120/62, pulse (!) 114, temperature 98.5 F (36.9 C), temperature source Oral, resp. rate 18, height 5\' 1"  (1.549 m), weight 43.4 kg (95 lb 9.6 oz), SpO2 94 %.  PHYSICAL EXAMINATION:   Physical Exam  GENERAL:  83 y.o.-year-old patient lying in the bed with no acute distress.  EYES: Pupils equal, round, reactive to light and accommodation. No scleral  icterus. Extraocular muscles intact.  HEENT: Head atraumatic, normocephalic. Oropharynx and nasopharynx clear.  NECK:  Supple, no jugular venous distention. No thyroid enlargement, no tenderness.  LUNGS: Normal breath sounds bilaterally, no wheezing, rales, rhonchi. No use of accessory muscles of respiration.  CARDIOVASCULAR: S1, S2 normal. No murmurs, rubs, or gallops.  ABDOMEN: Soft, nontender, nondistended. Bowel sounds present. No organomegaly or mass.  EXTREMITIES: No cyanosis, clubbing or edema b/l.    NEUROLOGIC: Cranial nerves II through XII are intact. No focal Motor or sensory deficits b/l.   PSYCHIATRIC: The patient is alert and oriented x 3.  SKIN: No obvious rash, lesion, or ulcer.   LABORATORY PANEL:   CBC  Recent Labs Lab 08/25/17 0538  WBC 12.5*  HGB 12.1  HCT 36.1  PLT 298   ------------------------------------------------------------------------------------------------------------------ Chemistries   Recent Labs Lab 08/24/17 1457 08/25/17 0538  NA 140 140  K 5.4* 4.0  CL 105 104  CO2 24 27  GLUCOSE 172* 117*  BUN 20 25*  CREATININE 1.04* 1.00  CALCIUM 10.2 9.7  MG 2.3  --   AST 148*  --   ALT 73*  --   ALKPHOS 156*  --   BILITOT 0.8  --    ------------------------------------------------------------------------------------------------------------------  Cardiac Enzymes  Recent Labs Lab 08/25/17 1051  TROPONINI 13.51*   ------------------------------------------------------------------------------------------------------------------  RADIOLOGY:  Dg Chest Portable 1 View  Result Date: 08/24/2017 CLINICAL DATA:  Chest pain EXAM: PORTABLE CHEST 1 VIEW COMPARISON:  05/17/2016 FINDINGS: No acute consolidation or pleural effusion.  Stable mild cardiomegaly. Minimal atherosclerotic calcifications at the arch. No pneumothorax. IMPRESSION: 1. No radiographic evidence for acute cardiopulmonary abnormality 2. Stable mild cardiomegaly Electronically Signed    By: Jasmine Pang M.D.   On: 08/24/2017 14:19     ASSESSMENT AND PLAN:   * NSTEMI Discussed with Dr. Okey Dupre. No cath. Start heparin drip Check echo Telemetry BB, ACEi ASA  * leukocytosis.etiology unclear No signs of infection. Likely stress reaction.  Improved  * hyperkalemia.  resolved  * hypertension. Continue Coreg and Imdur. Lisinopril Coreg dose increased today  * DVT prophylaxis On heparin drip  All the records are reviewed and case discussed with Care Management/Social Worker Management plans discussed with the patient, family and they are in agreement.  CODE STATUS: DNR  DVT Prophylaxis: SCDs  TOTAL TIME TAKING CARE OF THIS PATIENT: 35 minutes.   POSSIBLE D/C IN 1-2 DAYS, DEPENDING ON CLINICAL CONDITION.  Milagros Loll R M.D on 08/25/2017 at 12:06 PM  Between 7am to 6pm - Pager - [phone]  After 6pm go to [email or web address] - password [email or web address]  [facility]  Office  [phone]  CC: Primary care physician; Karie Schwalbe, MD  Note: This dictation was prepared with Dragon dictation along with smaller phrase technology. Any transcriptional errors that result from this process are unintentional.

## 2017-08-25 NOTE — Progress Notes (Signed)
ANTICOAGULATION CONSULT NOTE  Follow up  Pharmacy Consult for heparin gtt Indication: chest pain/ACS  Allergies  Allergen Reactions  . Bee Venom Rash  . Plavix [Clopidogrel] Rash  . Atorvastatin Other (See Comments)    Reaction:  Blisters on feet   . Diltiazem Hcl Other (See Comments)    Reaction:  Unknown   . Dye Fdc Red [Red Dye] Other (See Comments)    Reaction:  Unknown   . Gemfibrozil Other (See Comments)    Reaction:  Unknown   . Niacin Other (See Comments)    Reaction:  Unknown   . Sulfasalazine Rash    Patient Measurements: Height: 5\' 1"  (154.9 cm) Weight: 95 lb 9.6 oz (43.4 kg) IBW/kg (Calculated) : 47.8 Heparin Dosing Weight: 44kg  Vital Signs: Temp: 97.7 F (36.5 C) (09/08 1946) Temp Source: Oral (09/08 1946) BP: 98/54 (09/08 1946) Pulse Rate: 81 (09/08 1946)  Labs:  Recent Labs  08/24/17 1428 08/24/17 1457 08/25/17 0538 08/25/17 1051 08/25/17 1611 08/25/17 1856  HGB 13.1  --  12.1  --   --   --   HCT 39.4  --  36.1  --   --   --   PLT 275  --  298  --   --   --   APTT  --   --   --  44*  --   --   LABPROT  --   --   --  15.2  --   --   INR  --   --   --  1.21  --   --   HEPARINUNFRC  --   --   --   --   --  0.11*  CREATININE  --  1.04* 1.00  --   --   --   TROPONINI  --  1.10*  --  13.51* 12.02*  --     Estimated Creatinine Clearance: 23.1 mL/min (by C-G formula based on SCr of 1 mg/dL).   Medical History: Past Medical History:  Diagnosis Date  . CAD (coronary artery disease)    a. S/P previous MI;  b. 04/2007 Cath/PCI: Taxus DES' to mid/distal RCA and RPDA as well as Ramus;  c. myoview 8/09: EF 78%, normal perfusion  . Carotid stenosis    Bilateral, mild to moderate  . Chronic back pain   . Chronic shoulder pain   . Hyperlipidemia    Intolerant of many statins  . Hypertension   . Mild mitral and aortic regurgitation    a. 11/2011 Echo: EF 55-65%, No RWMA, Gr 1 DD, Mild AI/MR.  Marland Kitchen. Neuropathy   . Osteoarthritis   . Osteoporosis   .  Pelvic fracture (HCC) 6/16  . Peripheral vascular disease (HCC)     Medications:  Prescriptions Prior to Admission  Medication Sig Dispense Refill Last Dose  . aspirin EC 81 MG tablet Take 81 mg by mouth daily.    08/24/2017 at 0800  . carvedilol (COREG) 3.125 MG tablet TAKE 1 TABLET BY MOUTH TWO  TIMES DAILY 180 tablet 3 08/24/2017 at 0800  . Cholecalciferol (VITAMIN D3) 2000 units capsule Take 2,000 Units by mouth daily.   08/24/2017 at 0800  . isosorbide mononitrate (IMDUR) 30 MG 24 hr tablet TAKE 1 TABLET BY MOUTH  DAILY 90 tablet 3 08/24/2017 at 0800  . lisinopril (PRINIVIL,ZESTRIL) 10 MG tablet TAKE 1 TABLET BY MOUTH  DAILY 90 tablet 3 08/24/2017 at 0800  . MEGARED OMEGA-3 KRILL OIL 500 MG CAPS Take  1 capsule by mouth daily.    08/24/2017 at 0800  . mirtazapine (REMERON) 7.5 MG tablet Take 7.5 mg by mouth at bedtime.    08/23/2017 at 2100  . Multiple Vitamins-Minerals (MULTIVITAL) tablet Take 1 tablet by mouth daily.   08/24/2017 at 0800  . rosuvastatin (CRESTOR) 5 MG tablet Take 5 mg by mouth daily.   08/24/2017 at 0800  . venlafaxine XR (EFFEXOR-XR) 37.5 MG 24 hr capsule Take 37.5 mg by mouth daily with breakfast.   08/24/2017 at 0800  . acetaminophen (TYLENOL 8 HOUR) 650 MG CR tablet Take 650 mg by mouth every 8 (eight) hours as needed for pain. Do not exceed 3000 mg / 24 hrs of acetaminophen. Consider all sources   prn at prn  . ibandronate (BONIVA) 150 MG tablet Take 150 mg by mouth every 30 (thirty) days. Take in the morning with a full glass of water, on an empty stomach, and do not take anything else by mouth or lie down for the next 30 min.   Not Taking at Unknown time  . Lidocaine (ASPERCREME LIDOCAINE) 4 % PTCH Apply 1 patch topically daily. Apply to lower back/sacrum for pain. Remove after 12 hours.   Taking  . meclizine (ANTIVERT) 25 MG tablet Take 1 tablet (25 mg total) by mouth 3 (three) times daily as needed for dizziness. (Patient not taking: Reported on 08/24/2017) 30 tablet 3 Not Taking at  Unknown time  . nitroGLYCERIN (NITROSTAT) 0.4 MG SL tablet Place 1 tablet (0.4 mg total) under the tongue every 5 (five) minutes as needed for chest pain. 25 tablet 2 prn at prn  . traMADol (ULTRAM) 50 MG tablet Take by mouth every 6 (six) hours as needed.   prn at prn   Scheduled:  . aspirin EC  81 mg Oral Daily  . carvedilol  6.25 mg Oral BID WC  . heparin  1,300 Units Intravenous Once  . isosorbide mononitrate  30 mg Oral Daily  . lidocaine  1 patch Transdermal Daily  . lisinopril  10 mg Oral Daily  . metoprolol tartrate  5 mg Intravenous Once  . mirtazapine  7.5 mg Oral QHS  . rosuvastatin  5 mg Oral Daily  . venlafaxine XR  37.5 mg Oral Q breakfast   Infusions:  . heparin     PRN: acetaminophen **OR** acetaminophen, albuterol, nitroGLYCERIN, ondansetron **OR** ondansetron (ZOFRAN) IV, polyethylene glycol, traMADol Anti-infectives    None      Assessment: 83 year old woman requiring heparin gtt for acs  Goal of Therapy:  Heparin level 0.3-0.7 units/ml Monitor platelets by anticoagulation protocol: Yes   Plan:  Give 2600 units bolus x 1 Start heparin infusion at 500 units/hr Check anti-Xa level in 8 hours and daily while on heparin Continue to monitor H&H and platelets   9/8  HL 0.11, heparin 1300 units bolus, and increase rate to 700 units/hr will recheck in 6 hours.  Merl Guardino 08/25/2017,9:45 PM

## 2017-08-25 NOTE — Progress Notes (Signed)
Pt HR fluctuating between 130-190. Pt asymptomatic. MD Sudini made aware. One time dose 5 mg IV metoprolol ordered. Pt HR has sustained between 90 and 118 at this time. Metoprolol not given at this time]

## 2017-08-26 DIAGNOSIS — I255 Ischemic cardiomyopathy: Secondary | ICD-10-CM

## 2017-08-26 LAB — HEPARIN LEVEL (UNFRACTIONATED)
HEPARIN UNFRACTIONATED: 0.46 [IU]/mL (ref 0.30–0.70)
Heparin Unfractionated: 0.35 IU/mL (ref 0.30–0.70)
Heparin Unfractionated: 0.28 IU/mL — ABNORMAL LOW (ref 0.30–0.70)

## 2017-08-26 LAB — BASIC METABOLIC PANEL
Anion gap: 11 (ref 5–15)
BUN: 33 mg/dL — ABNORMAL HIGH (ref 6–20)
CO2: 27 mmol/L (ref 22–32)
CREATININE: 1.21 mg/dL — AB (ref 0.44–1.00)
Calcium: 10.2 mg/dL (ref 8.9–10.3)
Chloride: 100 mmol/L — ABNORMAL LOW (ref 101–111)
GFR calc non Af Amer: 37 mL/min — ABNORMAL LOW (ref >60)
GFR, EST AFRICAN AMERICAN: 43 mL/min — AB (ref >60)
Glucose, Bld: 99 mg/dL (ref 65–99)
POTASSIUM: 3.8 mmol/L (ref 3.5–5.1)
SODIUM: 138 mmol/L (ref 135–145)

## 2017-08-26 LAB — MAGNESIUM: MAGNESIUM: 2.4 mg/dL (ref 1.7–2.4)

## 2017-08-26 LAB — TROPONIN I: Troponin I: 11.07 ng/mL (ref <0.03)

## 2017-08-26 MED ORDER — POTASSIUM CHLORIDE CRYS ER 20 MEQ PO TBCR
20.0000 meq | EXTENDED_RELEASE_TABLET | Freq: Once | ORAL | Status: AC
  Administered 2017-08-26: 20 meq via ORAL
  Filled 2017-08-26: qty 1

## 2017-08-26 MED ORDER — AMIODARONE HCL 200 MG PO TABS
400.0000 mg | ORAL_TABLET | Freq: Two times a day (BID) | ORAL | Status: DC
  Administered 2017-08-26 – 2017-08-27 (×3): 400 mg via ORAL
  Filled 2017-08-26 (×3): qty 2

## 2017-08-26 MED ORDER — ADULT MULTIVITAMIN W/MINERALS CH
1.0000 | ORAL_TABLET | Freq: Every day | ORAL | Status: DC
  Administered 2017-08-27: 1 via ORAL
  Filled 2017-08-26: qty 1

## 2017-08-26 MED ORDER — POTASSIUM CHLORIDE CRYS ER 20 MEQ PO TBCR
20.0000 meq | EXTENDED_RELEASE_TABLET | Freq: Once | ORAL | Status: DC
  Filled 2017-08-26: qty 1

## 2017-08-26 MED ORDER — ENSURE ENLIVE PO LIQD
237.0000 mL | Freq: Two times a day (BID) | ORAL | Status: DC
  Administered 2017-08-27: 237 mL via ORAL

## 2017-08-26 MED ORDER — HEPARIN BOLUS VIA INFUSION
700.0000 [IU] | Freq: Once | INTRAVENOUS | Status: AC
  Administered 2017-08-26: 700 [IU] via INTRAVENOUS
  Filled 2017-08-26: qty 700

## 2017-08-26 NOTE — Progress Notes (Signed)
Initial Nutrition Assessment  DOCUMENTATION CODES:   Severe malnutrition in context of chronic illness, Underweight  INTERVENTION:  Will downgrade diet to dysphagia 2 (fine chop) with thin liquids as patient is having difficulty chewing without her dentures.  Provide Ensure Enlive po BID, each supplement provides 350 kcal and 20 grams of protein. Patient prefers vanilla or strawberry.  Provide Magic cup TID with meals, each supplement provides 290 kcal and 9 grams of protein. Patient prefers vanilla, orange, or wild berry flavor.  Provide multivitamin with minerals daily.  Encouraged intake of small, frequent meals in setting of early satiety. Discussed eating calorie- and protein-dense foods to prevent further unintentional weight loss/loss of lean body mass.  NUTRITION DIAGNOSIS:   Malnutrition (Severe) related to chronic illness (advanced age, decreased appetite, recent hip fracture) as evidenced by severe depletion of body fat, severe depletion of muscle mass, 14.5 percent weight loss over 2 months.  GOAL:   Patient will meet greater than or equal to 90% of their needs  MONITOR:   PO intake, Supplement acceptance, Labs, Weight trends, I & O's  REASON FOR ASSESSMENT:   Malnutrition Screening Tool    ASSESSMENT:   83 year old female with PMHx of CAD, hx MI, HTN, OP, PVD, HLD, chronic shoulder pain, chronic back pain, OA, carotid stenosis, recent hip fracture who presented with lower chest and epigastric pain admitted with NSTEMI, A-fib.   Spoke with patient, her daughter, and granddaughter at bedside. Patient reports she has a poor appetite. She started having decline in appetite and weight when she broke her hip in July. [facility] August 13th. Patient reports she has not really been eating any meals lately. She may have bites of "slimy" okra, lima beans, pinto beans. Her daughter (who she is staying with now) makes her  Boost milk shakes with vanilla ice cream. Patient reports she has early satiety and cannot eat much at one time. She enjoyed Mining engineerMagic Cup when she stayed at [facility]. Also amenable to drinking Ensure between meals. Dentures are no longer fitting after her weight loss.  UBW was 115-120 lbs. Per chart patient was 113 lbs on 06/18/2017. She has lost 16.4 lbs (14.5% body weight) over the past 2 months, which is significant for time frame.   Medications reviewed and include: amiodarone, carvedilol, Remeron 7.5 mg QHS, heparin.  Labs reviewed: Chloride 100, BUN 33, Creatinine 1.21.  Nutrition-Focused physical exam completed. Findings are severe fat depletion (orbital region, upper arm region, thoracic/lumbar region), severe muscle depletion (temple region, clavicle bone region, clavicle/acromion bone region, scapular bone region, dorsal hand, patellar region, anterior thigh region, posterior calf region), and no edema. Edentulous. Patient reports PTA she was able to ambulate with a walker.  Discussed with RN.  Diet Order:  Diet Heart Room service appropriate? Yes; Fluid consistency: Thin  Skin:  Reviewed, no issues  Last BM:  08/25/2017 - small type 1  Height:   Ht Readings from Last 1 Encounters:  08/24/17 5\' 1"  (1.549 m)    Weight:   Wt Readings from Last 1 Encounters:  08/26/17 96 lb 9.6 oz (43.8 kg)    Ideal Body Weight:  47.7 kg  BMI:  Body mass index is 18.25 kg/m.  Estimated Nutritional Needs:   Kcal:  1315-1530 (30-35 kcal/kg)  Protein:  65-80 grams (1.5-1.8 grams/kg)  Fluid:  1.1-1.3 L/day (25-30 ml/kg)  EDUCATION NEEDS:   Education needs addressed  Helane RimaLeanne Mallory Schaad, MS, RD, LDN  Pager: [phone] After Hours Pager: [phone]

## 2017-08-26 NOTE — Progress Notes (Signed)
Progress Note  Patient Name: Christina [name] Date of Encounter: 08/26/2017  Primary Cardiologist: Kirke CorinArida  Subjective   No further epigastric pain or nausea. Patient denies chest pain and shortness of breath. She tried to walk to the bathroom yesterday and felt very weak in the legs swell as off-balance.  Inpatient Medications    Scheduled Meds: . amiodarone  400 mg Oral BID  . aspirin EC  81 mg Oral Daily  . carvedilol  6.25 mg Oral BID WC  . isosorbide mononitrate  30 mg Oral Daily  . lidocaine  1 patch Transdermal Daily  . lisinopril  10 mg Oral Daily  . metoprolol tartrate  5 mg Intravenous Once  . mirtazapine  7.5 mg Oral QHS  . rosuvastatin  5 mg Oral Daily  . venlafaxine XR  37.5 mg Oral Q breakfast   Continuous Infusions: . heparin 700 Units/hr (08/25/17 2238)   PRN Meds: acetaminophen **OR** acetaminophen, albuterol, nitroGLYCERIN, ondansetron **OR** ondansetron (ZOFRAN) IV, polyethylene glycol, traMADol   Vital Signs    Vitals:   08/25/17 1946 08/25/17 2355 08/26/17 0410 08/26/17 0825  BP: (!) 98/54 106/65 116/64 127/61  Pulse: 81 74 75 78  Resp: 18  18   Temp: 97.7 F (36.5 C)  98.2 F (36.8 C) 97.7 F (36.5 C)  TempSrc: Oral  Oral Oral  SpO2: 94%  95% 100%  Weight:   96 lb 9.6 oz (43.8 kg)   Height:        Intake/Output Summary (Last 24 hours) at 08/26/17 1105 Last data filed at 08/26/17 1015  Gross per 24 hour  Intake           836.94 ml  Output              250 ml  Net           586.94 ml   Filed Weights   08/24/17 1739 08/25/17 0446 08/26/17 0410  Weight: 97 lb (44 kg) 95 lb 9.6 oz (43.4 kg) 96 lb 9.6 oz (43.8 kg)    Telemetry    Predominantly sinus rhythm with episodes of atrial fibrillation with rapid ventricular response. Heart rate up to 194 bpm. Longest episode lasted from 1228-147 yesterday afternoon. Occasional PVCs also noted. - Personally Reviewed  ECG    Artifact noted. Likely sinus rhythm with left axis deviation and borderline  LVH. Anterior Q waves are present with subtle ST elevation (less than 1 mm) and biphasic T waves. - Personally Reviewed  Physical Exam   GEN: Frail elderly woman, lying comfortably in bed. Neck: No JVD or HJR Cardiac: RRR, no murmurs, rubs, or gallops.  Respiratory: Clear to auscultation bilaterally. GI: Soft, nontender, non-distended  MS: No edema; No deformity. Neuro:  Nonfocal  Psych: Normal affect   Labs    Chemistry Recent Labs Lab 08/24/17 1457 08/25/17 0538 08/26/17 0553  NA 140 140 138  K 5.4* 4.0 3.8  CL 105 104 100*  CO2 24 27 27   GLUCOSE 172* 117* 99  BUN 20 25* 33*  CREATININE 1.04* 1.00 1.21*  CALCIUM 10.2 9.7 10.2  PROT 7.5  --   --   ALBUMIN 3.5  --   --   AST 148*  --   --   ALT 73*  --   --   ALKPHOS 156*  --   --   BILITOT 0.8  --   --   GFRNONAA 44* 46* 37*  GFRAA 51* 54* 43*  ANIONGAP 11 9  11     Hematology Recent Labs Lab 08/24/17 1428 08/25/17 0538  WBC 20.6* 12.5*  RBC 4.34 3.95  HGB 13.1 12.1  HCT 39.4 36.1  MCV 90.8 91.2  MCH 30.2 30.6  MCHC 33.2 33.6  RDW 14.3 14.0  PLT 275 298    Cardiac Enzymes Recent Labs Lab 08/24/17 1457 08/25/17 1051 08/25/17 1611 08/26/17 0008  TROPONINI 1.10* 13.51* 12.02* 11.07*   No results for input(s): TROPIPOC in the last 168 hours.   BNPNo results for input(s): BNP, PROBNP in the last 168 hours.   DDimer No results for input(s): DDIMER in the last 168 hours.   Radiology    Dg Chest Portable 1 View  Result Date: 08/24/2017 CLINICAL DATA:  Chest pain EXAM: PORTABLE CHEST 1 VIEW COMPARISON:  05/17/2016 FINDINGS: No acute consolidation or pleural effusion. Stable mild cardiomegaly. Minimal atherosclerotic calcifications at the arch. No pneumothorax. IMPRESSION: 1. No radiographic evidence for acute cardiopulmonary abnormality 2. Stable mild cardiomegaly Electronically Signed   By: Jasmine Pang M.D.   On: 08/24/2017 14:19    Cardiac Studies   Echo (07/02/15): Normal LV size with mild  focal basal hypertrophy of the septum. LVEF 60-65% with normal wall motion and grade 1 diastolic dysfunction. Trivial aortic regurgitation. Mild left atrial enlargement. Normal RV size and function. Borderline elevated pulmonary artery pressure.  Patient Profile     83 y.o. female artery disease status post PCI to the ramus and RCA in 2008, peripheral last few disease, hypertension, hyperlipidemia, and multiple falls, admitted with epigastric pain and NSTEMI.  Assessment & Plan   NSTEMI  No further epigastric pain or any chest pain. Troponin peaked at 13.5 yesterday morning. Repeat EKG shows likely recent anterior MI, consistent with findings on echocardiogram.  Continue heparin infusion for total of 48 hours her medical management of MI. Patient and family have declined cardiac catheterization on multiple occasions during this admission.  Continue low-dose aspirin. Patient is not a candidate for dual antiplatelet therapy due to history of clopidogrel allergy and multiple falls in the past.  Continue current doses of metoprolol, isosorbide mononitrate, and rosuvastatin.  Ischemic cardiomyopathy LVEF severely decreased with large LAD territory wall motion abnormality by echo. Patient appears euvolemic and does not have any symptoms of heart failure, though her activity level has been quite limited since admission.  Continue carvedilol 6.25 mg twice a day and lisinopril 10 mg daily.  If patient develops shortness of breath or other signs of volume overload, consider gentle diuresis.  Paroxysmal atrial fibrillation Repeat episode of atrial fibrillation with rapid ventricular response was noted yesterday afternoon. Though patient was asymptomatic at the time, I am concerned that she would not be able to tolerate such high heart rates for extended periods. She is currently on heparin infusion but would not be a good long-term candidate for anticoagulation due to frequent falls.  Start  amiodarone 400 mg twice a day to complete 10 g load. She should continue amiodarone 200 mg daily thereafter.  Continue aspirin 81 mg daily.  Replete potassium and magnesium to maintain levels greater than 4.0 and 2.0, respectively.  Hypertension Blood pressure is normal to borderline low. We will not make any changes to carvedilol or lisinopril at this time.  Hyperlipidemia  Continue rosuvastatin 5 mg daily.    Signed, Yvonne Kendall, MD  08/26/2017, 11:05 AM

## 2017-08-26 NOTE — Progress Notes (Deleted)
ANTICOAGULATION CONSULT NOTE  Follow up  Pharmacy Consult for heparin gtt Indication: chest pain/ACS  Allergies  Allergen Reactions  . Bee Venom Rash  . Plavix [Clopidogrel] Rash  . Atorvastatin Other (See Comments)    Reaction:  Blisters on feet   . Diltiazem Hcl Other (See Comments)    Reaction:  Unknown   . Dye Fdc Red [Red Dye] Other (See Comments)    Reaction:  Unknown   . Gemfibrozil Other (See Comments)    Reaction:  Unknown   . Niacin Other (See Comments)    Reaction:  Unknown   . Sulfasalazine Rash    Patient Measurements: Height:  (154.9 cm) Weight: 96 lb 9.6 oz (43.8 kg) IBW/kg (Calculated) : 47.8 Heparin Dosing Weight: 44kg  Vital Signs: Temp: 98 F (36.7 C) (09/09 1205) Temp Source: Oral (09/09 0825) BP: 95/78 (09/09 1205) Pulse Rate: 79 (09/09 1205)  Labs:  Recent Labs  08/24/17 1428  08/24/17 1457 08/25/17 0538 08/25/17 1051 08/25/17 1611 08/25/17 1856 08/26/17 0008 08/26/17 0553 08/26/17 1412  HGB 13.1  --   --  12.1  --   --   --   --   --   --   HCT 39.4  --   --  36.1  --   --   --   --   --   --   PLT 275  --   --  298  --   --   --   --   --   --   APTT  --   --   --   --  44*  --   --   --   --   --   LABPROT  --   --   --   --  15.2  --   --   --   --   --   INR  --   --   --   --  1.21  --   --   --   --   --   HEPARINUNFRC  --   --   --   --   --   --  0.11*  --  0.35 0.28*  CREATININE  --   --  1.04* 1.00  --   --   --   --  1.21*  --   TROPONINI  --   < > 1.10*  --  13.51* 12.02*  --  11.07*  --   --   < > = values in this interval not displayed.  Estimated Creatinine Clearance: 19.2 mL/min (A) (by C-G formula based on SCr of 1.21 mg/dL (H)).   Medical History: Past Medical History:  Diagnosis Date  . CAD (coronary artery disease)    a. S/P previous MI;  b. 04/2007 Cath/PCI: Taxus DES' to mid/distal RCA and RPDA as well as Ramus;  c. myoview 8/09: EF 78%, normal perfusion  . Carotid stenosis    Bilateral, mild to  moderate  . Chronic back pain   . Chronic shoulder pain   . Hyperlipidemia    Intolerant of many statins  . Hypertension   . Mild mitral and aortic regurgitation    a. 11/2011 Echo: EF 55-65%, No RWMA, Gr 1 DD, Mild AI/MR.  Marland Kitchen Neuropathy   . Osteoarthritis   . Osteoporosis   . Pelvic fracture (HCC) 6/16  . Peripheral vascular disease (HCC)     Medications:  Prescriptions Prior to Admission  Medication Sig  Dispense Refill Last Dose  . aspirin EC 81 MG tablet Take 81 mg by mouth daily.    08/24/2017 at 0800  . carvedilol (COREG) 3.125 MG tablet TAKE 1 TABLET BY MOUTH TWO  TIMES DAILY 180 tablet 3 08/24/2017 at 0800  . Cholecalciferol (VITAMIN D3) 2000 units capsule Take 2,000 Units by mouth daily.   08/24/2017 at 0800  . isosorbide mononitrate (IMDUR) 30 MG 24 hr tablet TAKE 1 TABLET BY MOUTH  DAILY 90 tablet 3 08/24/2017 at 0800  . lisinopril (PRINIVIL,ZESTRIL) 10 MG tablet TAKE 1 TABLET BY MOUTH  DAILY 90 tablet 3 08/24/2017 at 0800  . MEGARED OMEGA-3 KRILL OIL 500 MG CAPS Take 1 capsule by mouth daily.    08/24/2017 at 0800  . mirtazapine (REMERON) 7.5 MG tablet Take 7.5 mg by mouth at bedtime.    08/23/2017 at 2100  . Multiple Vitamins-Minerals (MULTIVITAL) tablet Take 1 tablet by mouth daily.   08/24/2017 at 0800  . rosuvastatin (CRESTOR) 5 MG tablet Take 5 mg by mouth daily.   08/24/2017 at 0800  . venlafaxine XR (EFFEXOR-XR) 37.5 MG 24 hr capsule Take 37.5 mg by mouth daily with breakfast.   08/24/2017 at 0800  . acetaminophen (TYLENOL 8 HOUR) 650 MG CR tablet Take 650 mg by mouth every 8 (eight) hours as needed for pain. Do not exceed 3000 mg / 24 hrs of acetaminophen. Consider all sources   prn at prn  . ibandronate (BONIVA) 150 MG tablet Take 150 mg by mouth every 30 (thirty) days. Take in the morning with a full glass of water, on an empty stomach, and do not take anything else by mouth or lie down for the next 30 min.   Not Taking at Unknown time  . Lidocaine (ASPERCREME LIDOCAINE) 4 % PTCH Apply  1 patch topically daily. Apply to lower back/sacrum for pain. Remove after 12 hours.   Taking  . meclizine (ANTIVERT) 25 MG tablet Take 1 tablet (25 mg total) by mouth 3 (three) times daily as needed for dizziness. (Patient not taking: Reported on 08/24/2017) 30 tablet 3 Not Taking at Unknown time  . nitroGLYCERIN (NITROSTAT) 0.4 MG SL tablet Place 1 tablet (0.4 mg total) under the tongue every 5 (five) minutes as needed for chest pain. 25 tablet 2 prn at prn  . traMADol (ULTRAM) 50 MG tablet Take by mouth every 6 (six) hours as needed.   prn at prn   Scheduled:  . amiodarone  400 mg Oral BID  . aspirin EC  81 mg Oral Daily  . carvedilol  6.25 mg Oral BID WC  . isosorbide mononitrate  30 mg Oral Daily  . lidocaine  1 patch Transdermal Daily  . lisinopril  10 mg Oral Daily  . metoprolol tartrate  5 mg Intravenous Once  . mirtazapine  7.5 mg Oral QHS  . potassium chloride  20 mEq Oral Once  . rosuvastatin  5 mg Oral Daily  . venlafaxine XR  37.5 mg Oral Q breakfast   Infusions:  . heparin 700 Units/hr (08/25/17 2238)   PRN: acetaminophen **OR** acetaminophen, albuterol, nitroGLYCERIN, ondansetron **OR** ondansetron (ZOFRAN) IV, polyethylene glycol, traMADol Anti-infectives    None      Assessment: 83 year old woman requiring heparin gtt for acs  Goal of Therapy:  Heparin level 0.3-0.7 units/ml Monitor platelets by anticoagulation protocol: Yes   Plan:  Give 2600 units bolus x 1 Start heparin infusion at 500 units/hr Check anti-Xa level in 8 hours and daily  while on heparin Continue to monitor H&H and platelets   9/8  HL 0.11, heparin 1300 units bolus, and increase rate to 700 units/hr will recheck in 6 hours.  9/8 @ 0600 HL 0.35 therapeutic. Will continue current rate and will recheck @ 1400.   9/9 @ 14:12 HL = 0.28.  Ordered a bolus of 600 units and increased drip rate to 750 units/hr.  Will recheck HL in 8 hours on 9/9 at 2300.  Stormy Card, [facility] Clinical  Pharmacist 08/26/2017, 3:15 PM

## 2017-08-26 NOTE — Progress Notes (Signed)
[facility] -  at [facility]   PATIENT NAME: Christina [name]    MR#:  [id number]  DATE OF BIRTH:  05-Oct-1920  SUBJECTIVE:  CHIEF COMPLAINT:   Chief Complaint  Patient presents with  . Chest Pain   No CP today. No SOB. Feels weak  Atrial fibrillation with rapid ventricular rate yesterday. Responded well to IV Lopressor. Today she is in normal sinus rhythm. On heparin drip  REVIEW OF SYSTEMS:    Review of Systems  Constitutional: Positive for malaise/fatigue. Negative for chills and fever.  HENT: Negative for sore throat.   Eyes: Negative for blurred vision, double vision and pain.  Respiratory: Negative for cough, hemoptysis, shortness of breath and wheezing.   Cardiovascular: Negative for chest pain, palpitations, orthopnea and leg swelling.  Gastrointestinal: Negative for abdominal pain, constipation, diarrhea, heartburn, nausea and vomiting.  Genitourinary: Negative for dysuria and hematuria.  Musculoskeletal: Negative for back pain and joint pain.  Skin: Negative for rash.  Neurological: Negative for sensory change, speech change, focal weakness and headaches.  Endo/Heme/Allergies: Does not bruise/bleed easily.  Psychiatric/Behavioral: Negative for depression. The patient is not nervous/anxious.     DRUG ALLERGIES:   Allergies  Allergen Reactions  . Bee Venom Rash  . Plavix [Clopidogrel] Rash  . Atorvastatin Other (See Comments)    Reaction:  Blisters on feet   . Diltiazem Hcl Other (See Comments)    Reaction:  Unknown   . Dye Fdc Red [Red Dye] Other (See Comments)    Reaction:  Unknown   . Gemfibrozil Other (See Comments)    Reaction:  Unknown   . Niacin Other (See Comments)    Reaction:  Unknown   . Sulfasalazine Rash    VITALS:  Blood pressure 95/78, pulse 79, temperature 98 F (36.7 C), resp. rate 14, height  (1.549 m), weight 43.8 kg (96 lb 9.6 oz), SpO2 95 %.  PHYSICAL EXAMINATION:   Physical Exam  GENERAL:  83  y.o.-year-old patient lying in the bed with no acute distress.  EYES: Pupils equal, round, reactive to light and accommodation. No scleral icterus. Extraocular muscles intact.  HEENT: Head atraumatic, normocephalic. Oropharynx and nasopharynx clear.  NECK:  Supple, no jugular venous distention. No thyroid enlargement, no tenderness.  LUNGS: Normal breath sounds bilaterally, no wheezing, rales, rhonchi. No use of accessory muscles of respiration.  CARDIOVASCULAR: S1, S2 normal. No murmurs, rubs, or gallops.  ABDOMEN: Soft, nontender, nondistended. Bowel sounds present. No organomegaly or mass.  EXTREMITIES: No cyanosis, clubbing or edema b/l.    NEUROLOGIC: Cranial nerves II through XII are intact. No focal Motor or sensory deficits b/l.   PSYCHIATRIC: The patient is alert and oriented x 3.  SKIN: No obvious rash, lesion, or ulcer.   LABORATORY PANEL:   CBC  Recent Labs Lab 08/25/17 0538  WBC 12.5*  HGB 12.1  HCT 36.1  PLT 298   ------------------------------------------------------------------------------------------------------------------ Chemistries   Recent Labs Lab 08/24/17 1457  08/26/17 0553  NA 140  < > 138  K 5.4*  < > 3.8  CL 105  < > 100*  CO2 24  < > 27  GLUCOSE 172*  < > 99  BUN 20  < > 33*  CREATININE 1.04*  < > 1.21*  CALCIUM 10.2  < > 10.2  MG 2.3  --  2.4  AST 148*  --   --   ALT 73*  --   --   ALKPHOS 156*  --   --  BILITOT 0.8  --   --   < > = values in this interval not displayed. ------------------------------------------------------------------------------------------------------------------  Cardiac Enzymes  Recent Labs Lab 08/26/17 0008  TROPONINI 11.07*   ------------------------------------------------------------------------------------------------------------------  RADIOLOGY:  Dg Chest Portable 1 View  Result Date: 08/24/2017 CLINICAL DATA:  Chest pain EXAM: PORTABLE CHEST 1 VIEW COMPARISON:  05/17/2016 FINDINGS: No acute  consolidation or pleural effusion. Stable mild cardiomegaly. Minimal atherosclerotic calcifications at the arch. No pneumothorax. IMPRESSION: 1. No radiographic evidence for acute cardiopulmonary abnormality 2. Stable mild cardiomegaly Electronically Signed   By: Jasmine PangKim  Fujinaga M.D.   On: 08/24/2017 14:19     ASSESSMENT AND PLAN:   * pAfib Started amiodarone In NSR today.  * NSTEMI Discussed with Dr. Okey DupreEnd. No cath. On heparin drip Echocardiogram with significant decrease in ejection fraction to 25%. Likely has LAD disease. Telemetry  BB, ACEi ASA  * Leukocytosis.etiology unclear No signs of infection. Likely stress reaction.  Improved  * Hyperkalemia.  resolved  * Hypertension. Continue Coreg and Imdur. Lisinopril Coreg dose increased   * DVT prophylaxis On heparin drip  All the records are reviewed and case discussed with Care Management/Social Worker Management plans discussed with the patient, family and they are in agreement.  CODE STATUS: DNR  DVT Prophylaxis: SCDs  TOTAL TIME TAKING CARE OF THIS PATIENT: 35 minutes.   POSSIBLE D/C IN 1-2 DAYS, DEPENDING ON CLINICAL CONDITION.  Milagros LollSudini, Ataya Murdy R M.D on 08/26/2017 at 12:32 PM  Between 7am to 6pm - Pager - [phone]  After 6pm go to [email or web address] - password [email or web address]  [facility]  Office  [phone]  CC: Primary care physician; Karie SchwalbeLetvak, Richard I, MD  Note: This dictation was prepared with Dragon dictation along with smaller phrase technology. Any transcriptional errors that result from this process are unintentional.

## 2017-08-26 NOTE — Progress Notes (Signed)
ANTICOAGULATION CONSULT NOTE  Follow up  Pharmacy Consult for heparin gtt Indication: chest pain/ACS  Allergies  Allergen Reactions  . Bee Venom Rash  . Plavix [Clopidogrel] Rash  . Atorvastatin Other (See Comments)    Reaction:  Blisters on feet   . Diltiazem Hcl Other (See Comments)    Reaction:  Unknown   . Dye Fdc Red [Red Dye] Other (See Comments)    Reaction:  Unknown   . Gemfibrozil Other (See Comments)    Reaction:  Unknown   . Niacin Other (See Comments)    Reaction:  Unknown   . Sulfasalazine Rash    Patient Measurements: Height:  (154.9 cm) Weight: 96 lb 9.6 oz (43.8 kg) IBW/kg (Calculated) : 47.8 Heparin Dosing Weight: 44kg  Vital Signs: Temp: 98.2 F (36.8 C) (09/09 0410) Temp Source: Oral (09/09 0410) BP: 116/64 (09/09 0410) Pulse Rate: 75 (09/09 0410)  Labs:  Recent Labs  08/24/17 1428  08/24/17 1457 08/25/17 0538 08/25/17 1051 08/25/17 1611 08/25/17 1856 08/26/17 0008 08/26/17 0553  HGB 13.1  --   --  12.1  --   --   --   --   --   HCT 39.4  --   --  36.1  --   --   --   --   --   PLT 275  --   --  298  --   --   --   --   --   APTT  --   --   --   --  44*  --   --   --   --   LABPROT  --   --   --   --  15.2  --   --   --   --   INR  --   --   --   --  1.21  --   --   --   --   HEPARINUNFRC  --   --   --   --   --   --  0.11*  --  0.35  CREATININE  --   --  1.04* 1.00  --   --   --   --   --   TROPONINI  --   < > 1.10*  --  13.51* 12.02*  --  11.07*  --   < > = values in this interval not displayed.  Estimated Creatinine Clearance: 23.3 mL/min (by C-G formula based on SCr of 1 mg/dL).   Medical History: Past Medical History:  Diagnosis Date  . CAD (coronary artery disease)    a. S/P previous MI;  b. 04/2007 Cath/PCI: Taxus DES' to mid/distal RCA and RPDA as well as Ramus;  c. myoview 8/09: EF 78%, normal perfusion  . Carotid stenosis    Bilateral, mild to moderate  . Chronic back pain   . Chronic shoulder pain   .  Hyperlipidemia    Intolerant of many statins  . Hypertension   . Mild mitral and aortic regurgitation    a. 11/2011 Echo: EF 55-65%, No RWMA, Gr 1 DD, Mild AI/MR.  Marland Kitchen Neuropathy   . Osteoarthritis   . Osteoporosis   . Pelvic fracture (HCC) 6/16  . Peripheral vascular disease (HCC)     Medications:  Prescriptions Prior to Admission  Medication Sig Dispense Refill Last Dose  . aspirin EC 81 MG tablet Take 81 mg by mouth daily.    08/24/2017 at 0800  . carvedilol (COREG)  3.125 MG tablet TAKE 1 TABLET BY MOUTH TWO  TIMES DAILY 180 tablet 3 08/24/2017 at 0800  . Cholecalciferol (VITAMIN D3) 2000 units capsule Take 2,000 Units by mouth daily.   08/24/2017 at 0800  . isosorbide mononitrate (IMDUR) 30 MG 24 hr tablet TAKE 1 TABLET BY MOUTH  DAILY 90 tablet 3 08/24/2017 at 0800  . lisinopril (PRINIVIL,ZESTRIL) 10 MG tablet TAKE 1 TABLET BY MOUTH  DAILY 90 tablet 3 08/24/2017 at 0800  . MEGARED OMEGA-3 KRILL OIL 500 MG CAPS Take 1 capsule by mouth daily.    08/24/2017 at 0800  . mirtazapine (REMERON) 7.5 MG tablet Take 7.5 mg by mouth at bedtime.    08/23/2017 at 2100  . Multiple Vitamins-Minerals (MULTIVITAL) tablet Take 1 tablet by mouth daily.   08/24/2017 at 0800  . rosuvastatin (CRESTOR) 5 MG tablet Take 5 mg by mouth daily.   08/24/2017 at 0800  . venlafaxine XR (EFFEXOR-XR) 37.5 MG 24 hr capsule Take 37.5 mg by mouth daily with breakfast.   08/24/2017 at 0800  . acetaminophen (TYLENOL 8 HOUR) 650 MG CR tablet Take 650 mg by mouth every 8 (eight) hours as needed for pain. Do not exceed 3000 mg / 24 hrs of acetaminophen. Consider all sources   prn at prn  . ibandronate (BONIVA) 150 MG tablet Take 150 mg by mouth every 30 (thirty) days. Take in the morning with a full glass of water, on an empty stomach, and do not take anything else by mouth or lie down for the next 30 min.   Not Taking at Unknown time  . Lidocaine (ASPERCREME LIDOCAINE) 4 % PTCH Apply 1 patch topically daily. Apply to lower back/sacrum for pain.  Remove after 12 hours.   Taking  . meclizine (ANTIVERT) 25 MG tablet Take 1 tablet (25 mg total) by mouth 3 (three) times daily as needed for dizziness. (Patient not taking: Reported on 08/24/2017) 30 tablet 3 Not Taking at Unknown time  . nitroGLYCERIN (NITROSTAT) 0.4 MG SL tablet Place 1 tablet (0.4 mg total) under the tongue every 5 (five) minutes as needed for chest pain. 25 tablet 2 prn at prn  . traMADol (ULTRAM) 50 MG tablet Take by mouth every 6 (six) hours as needed.   prn at prn   Scheduled:  . aspirin EC  81 mg Oral Daily  . carvedilol  6.25 mg Oral BID WC  . isosorbide mononitrate  30 mg Oral Daily  . lidocaine  1 patch Transdermal Daily  . lisinopril  10 mg Oral Daily  . metoprolol tartrate  5 mg Intravenous Once  . mirtazapine  7.5 mg Oral QHS  . rosuvastatin  5 mg Oral Daily  . venlafaxine XR  37.5 mg Oral Q breakfast   Infusions:  . heparin 700 Units/hr (08/25/17 2238)   PRN: acetaminophen **OR** acetaminophen, albuterol, nitroGLYCERIN, ondansetron **OR** ondansetron (ZOFRAN) IV, polyethylene glycol, traMADol Anti-infectives    None      Assessment: 83 year old woman requiring heparin gtt for acs  Goal of Therapy:  Heparin level 0.3-0.7 units/ml Monitor platelets by anticoagulation protocol: Yes   Plan:  Give 2600 units bolus x 1 Start heparin infusion at 500 units/hr Check anti-Xa level in 8 hours and daily while on heparin Continue to monitor H&H and platelets   9/8 @2130  HL 0.11, heparin 1300 units bolus, and increase rate to 700 units/hr will recheck in 6 hours.  9/8 @ 0600 HL 0.35 therapeutic. Will continue current rate and will recheck @  1400.  Thomasene Ripple, PharmD, BCPS Clinical Pharmacist 08/26/2017

## 2017-08-26 NOTE — Progress Notes (Signed)
ANTICOAGULATION CONSULT NOTE  Follow up  Pharmacy Consult for heparin gtt Indication: chest pain/ACS  Allergies  Allergen Reactions  . Bee Venom Rash  . Plavix [Clopidogrel] Rash  . Atorvastatin Other (See Comments)    Reaction:  Blisters on feet   . Diltiazem Hcl Other (See Comments)    Reaction:  Unknown   . Dye Fdc Red [Red Dye] Other (See Comments)    Reaction:  Unknown   . Gemfibrozil Other (See Comments)    Reaction:  Unknown   . Niacin Other (See Comments)    Reaction:  Unknown   . Sulfasalazine Rash    Patient Measurements: Height:  (154.9 cm) Weight: 96 lb 9.6 oz (43.8 kg) IBW/kg (Calculated) : 47.8 Heparin Dosing Weight: 44kg  Vital Signs: Temp: 98 F (36.7 C) (09/09 1205) Temp Source: Oral (09/09 0825) BP: 95/78 (09/09 1205) Pulse Rate: 79 (09/09 1205)  Labs:  Recent Labs  08/24/17 1428  08/24/17 1457 08/25/17 0538 08/25/17 1051 08/25/17 1611 08/25/17 1856 08/26/17 0008 08/26/17 0553 08/26/17 1412  HGB 13.1  --   --  12.1  --   --   --   --   --   --   HCT 39.4  --   --  36.1  --   --   --   --   --   --   PLT 275  --   --  298  --   --   --   --   --   --   APTT  --   --   --   --  44*  --   --   --   --   --   LABPROT  --   --   --   --  15.2  --   --   --   --   --   INR  --   --   --   --  1.21  --   --   --   --   --   HEPARINUNFRC  --   --   --   --   --   --  0.11*  --  0.35 0.28*  CREATININE  --   --  1.04* 1.00  --   --   --   --  1.21*  --   TROPONINI  --   < > 1.10*  --  13.51* 12.02*  --  11.07*  --   --   < > = values in this interval not displayed.  Estimated Creatinine Clearance: 19.2 mL/min (A) (by C-G formula based on SCr of 1.21 mg/dL (H)).   Medical History: Past Medical History:  Diagnosis Date  . CAD (coronary artery disease)    a. S/P previous MI;  b. 04/2007 Cath/PCI: Taxus DES' to mid/distal RCA and RPDA as well as Ramus;  c. myoview 8/09: EF 78%, normal perfusion  . Carotid stenosis    Bilateral, mild to  moderate  . Chronic back pain   . Chronic shoulder pain   . Hyperlipidemia    Intolerant of many statins  . Hypertension   . Mild mitral and aortic regurgitation    a. 11/2011 Echo: EF 55-65%, No RWMA, Gr 1 DD, Mild AI/MR.  Marland Kitchen Neuropathy   . Osteoarthritis   . Osteoporosis   . Pelvic fracture (HCC) 6/16  . Peripheral vascular disease (HCC)     Medications:  Prescriptions Prior to Admission  Medication Sig  Dispense Refill Last Dose  . aspirin EC 81 MG tablet Take 81 mg by mouth daily.    08/24/2017 at 0800  . carvedilol (COREG) 3.125 MG tablet TAKE 1 TABLET BY MOUTH TWO  TIMES DAILY 180 tablet 3 08/24/2017 at 0800  . Cholecalciferol (VITAMIN D3) 2000 units capsule Take 2,000 Units by mouth daily.   08/24/2017 at 0800  . isosorbide mononitrate (IMDUR) 30 MG 24 hr tablet TAKE 1 TABLET BY MOUTH  DAILY 90 tablet 3 08/24/2017 at 0800  . lisinopril (PRINIVIL,ZESTRIL) 10 MG tablet TAKE 1 TABLET BY MOUTH  DAILY 90 tablet 3 08/24/2017 at 0800  . MEGARED OMEGA-3 KRILL OIL 500 MG CAPS Take 1 capsule by mouth daily.    08/24/2017 at 0800  . mirtazapine (REMERON) 7.5 MG tablet Take 7.5 mg by mouth at bedtime.    08/23/2017 at 2100  . Multiple Vitamins-Minerals (MULTIVITAL) tablet Take 1 tablet by mouth daily.   08/24/2017 at 0800  . rosuvastatin (CRESTOR) 5 MG tablet Take 5 mg by mouth daily.   08/24/2017 at 0800  . venlafaxine XR (EFFEXOR-XR) 37.5 MG 24 hr capsule Take 37.5 mg by mouth daily with breakfast.   08/24/2017 at 0800  . acetaminophen (TYLENOL 8 HOUR) 650 MG CR tablet Take 650 mg by mouth every 8 (eight) hours as needed for pain. Do not exceed 3000 mg / 24 hrs of acetaminophen. Consider all sources   prn at prn  . ibandronate (BONIVA) 150 MG tablet Take 150 mg by mouth every 30 (thirty) days. Take in the morning with a full glass of water, on an empty stomach, and do not take anything else by mouth or lie down for the next 30 min.   Not Taking at Unknown time  . Lidocaine (ASPERCREME LIDOCAINE) 4 % PTCH Apply  1 patch topically daily. Apply to lower back/sacrum for pain. Remove after 12 hours.   Taking  . meclizine (ANTIVERT) 25 MG tablet Take 1 tablet (25 mg total) by mouth 3 (three) times daily as needed for dizziness. (Patient not taking: Reported on 08/24/2017) 30 tablet 3 Not Taking at Unknown time  . nitroGLYCERIN (NITROSTAT) 0.4 MG SL tablet Place 1 tablet (0.4 mg total) under the tongue every 5 (five) minutes as needed for chest pain. 25 tablet 2 prn at prn  . traMADol (ULTRAM) 50 MG tablet Take by mouth every 6 (six) hours as needed.   prn at prn   Scheduled:  . amiodarone  400 mg Oral BID  . aspirin EC  81 mg Oral Daily  . carvedilol  6.25 mg Oral BID WC  . heparin  700 Units Intravenous Once  . isosorbide mononitrate  30 mg Oral Daily  . lidocaine  1 patch Transdermal Daily  . lisinopril  10 mg Oral Daily  . metoprolol tartrate  5 mg Intravenous Once  . mirtazapine  7.5 mg Oral QHS  . potassium chloride  20 mEq Oral Once  . rosuvastatin  5 mg Oral Daily  . venlafaxine XR  37.5 mg Oral Q breakfast   Infusions:  . heparin 700 Units/hr (08/25/17 2238)   PRN: acetaminophen **OR** acetaminophen, albuterol, nitroGLYCERIN, ondansetron **OR** ondansetron (ZOFRAN) IV, polyethylene glycol, traMADol Anti-infectives    None      Assessment: 83 year old woman requiring heparin gtt for acs  Goal of Therapy:  Heparin level 0.3-0.7 units/ml Monitor platelets by anticoagulation protocol: Yes   Plan:  Give 2600 units bolus x 1 Start heparin infusion at 500 units/hr  Check anti-Xa level in 8 hours and daily while on heparin Continue to monitor H&H and platelets   9/8 @2130  HL 0.11, heparin 1300 units bolus, and increase rate to 700 units/hr will recheck in 6 hours.  9/8 @ 0600 HL 0.35 therapeutic. Will continue current rate and will recheck @ 1400.  9/9 @ 1500 HL 0.28, heparin bolus of 700 units and increase rate to 800 units/hr.  Recheck in 6 hours   Luan PullingGarrett Emmerie Battaglia, PharmD, MBA,  Liz ClaiborneBCGP Clinical Pharmacist [facility]    08/26/2017

## 2017-08-27 LAB — HEPARIN LEVEL (UNFRACTIONATED): HEPARIN UNFRACTIONATED: 0.48 [IU]/mL (ref 0.30–0.70)

## 2017-08-27 MED ORDER — CARVEDILOL 6.25 MG PO TABS
6.2500 mg | ORAL_TABLET | Freq: Two times a day (BID) | ORAL | 0 refills | Status: DC
Start: 2017-08-27 — End: 2017-09-04

## 2017-08-27 MED ORDER — AMIODARONE HCL 400 MG PO TABS: 200.0000 mg | ORAL_TABLET | Freq: Every day | ORAL | 0 refills | Status: DC

## 2017-08-27 NOTE — Progress Notes (Signed)
ANTICOAGULATION CONSULT NOTE  Follow up  Pharmacy Consult for heparin gtt Indication: chest pain/ACS  Allergies  Allergen Reactions  . Bee Venom Rash  . Plavix [Clopidogrel] Rash  . Atorvastatin Other (See Comments)    Reaction:  Blisters on feet   . Diltiazem Hcl Other (See Comments)    Reaction:  Unknown   . Dye Fdc Red [Red Dye] Other (See Comments)    Reaction:  Unknown   . Gemfibrozil Other (See Comments)    Reaction:  Unknown   . Niacin Other (See Comments)    Reaction:  Unknown   . Sulfasalazine Rash    Patient Measurements: Height:  (154.9 cm) Weight: 98 lb 8 oz (44.7 kg) IBW/kg (Calculated) : 47.8 Heparin Dosing Weight: 44kg  Vital Signs: Temp: 97.5 F (36.4 C) (09/10 0438) Temp Source: Oral (09/10 0438) BP: 106/60 (09/10 0438) Pulse Rate: 65 (09/10 0438)  Labs:  Recent Labs  08/24/17 1428  08/24/17 1457 08/25/17 0538 08/25/17 1051 08/25/17 1611  08/26/17 0008 08/26/17 0553 08/26/17 1412 08/26/17 2114 08/27/17 0448  HGB 13.1  --   --  12.1  --   --   --   --   --   --   --   --   HCT 39.4  --   --  36.1  --   --   --   --   --   --   --   --   PLT 275  --   --  298  --   --   --   --   --   --   --   --   APTT  --   --   --   --  44*  --   --   --   --   --   --   --   LABPROT  --   --   --   --  15.2  --   --   --   --   --   --   --   INR  --   --   --   --  1.21  --   --   --   --   --   --   --   HEPARINUNFRC  --   --   --   --   --   --   < >  --  0.35 0.28* 0.46 0.48  CREATININE  --   --  1.04* 1.00  --   --   --   --  1.21*  --   --   --   TROPONINI  --   < > 1.10*  --  13.51* 12.02*  --  11.07*  --   --   --   --   < > = values in this interval not displayed.  Estimated Creatinine Clearance: 19.6 mL/min (A) (by C-G formula based on SCr of 1.21 mg/dL (H)).   Medical History: Past Medical History:  Diagnosis Date  . CAD (coronary artery disease)    a. S/P previous MI;  b. 04/2007 Cath/PCI: Taxus DES' to mid/distal RCA and RPDA as  well as Ramus;  c. myoview 8/09: EF 78%, normal perfusion  . Carotid stenosis    Bilateral, mild to moderate  . Chronic back pain   . Chronic shoulder pain   . Hyperlipidemia    Intolerant of many statins  . Hypertension   . Mild mitral  and aortic regurgitation    a. 11/2011 Echo: EF 55-65%, No RWMA, Gr 1 DD, Mild AI/MR.  Marland Kitchen Neuropathy   . Osteoarthritis   . Osteoporosis   . Pelvic fracture (HCC) 6/16  . Peripheral vascular disease (HCC)     Medications:  Prescriptions Prior to Admission  Medication Sig Dispense Refill Last Dose  . aspirin EC 81 MG tablet Take 81 mg by mouth daily.    08/24/2017 at 0800  . carvedilol (COREG) 3.125 MG tablet TAKE 1 TABLET BY MOUTH TWO  TIMES DAILY 180 tablet 3 08/24/2017 at 0800  . Cholecalciferol (VITAMIN D3) 2000 units capsule Take 2,000 Units by mouth daily.   08/24/2017 at 0800  . isosorbide mononitrate (IMDUR) 30 MG 24 hr tablet TAKE 1 TABLET BY MOUTH  DAILY 90 tablet 3 08/24/2017 at 0800  . lisinopril (PRINIVIL,ZESTRIL) 10 MG tablet TAKE 1 TABLET BY MOUTH  DAILY 90 tablet 3 08/24/2017 at 0800  . MEGARED OMEGA-3 KRILL OIL 500 MG CAPS Take 1 capsule by mouth daily.    08/24/2017 at 0800  . mirtazapine (REMERON) 7.5 MG tablet Take 7.5 mg by mouth at bedtime.    08/23/2017 at 2100  . Multiple Vitamins-Minerals (MULTIVITAL) tablet Take 1 tablet by mouth daily.   08/24/2017 at 0800  . rosuvastatin (CRESTOR) 5 MG tablet Take 5 mg by mouth daily.   08/24/2017 at 0800  . venlafaxine XR (EFFEXOR-XR) 37.5 MG 24 hr capsule Take 37.5 mg by mouth daily with breakfast.   08/24/2017 at 0800  . acetaminophen (TYLENOL 8 HOUR) 650 MG CR tablet Take 650 mg by mouth every 8 (eight) hours as needed for pain. Do not exceed 3000 mg / 24 hrs of acetaminophen. Consider all sources   prn at prn  . ibandronate (BONIVA) 150 MG tablet Take 150 mg by mouth every 30 (thirty) days. Take in the morning with a full glass of water, on an empty stomach, and do not take anything else by mouth or lie down  for the next 30 min.   Not Taking at Unknown time  . Lidocaine (ASPERCREME LIDOCAINE) 4 % PTCH Apply 1 patch topically daily. Apply to lower back/sacrum for pain. Remove after 12 hours.   Taking  . meclizine (ANTIVERT) 25 MG tablet Take 1 tablet (25 mg total) by mouth 3 (three) times daily as needed for dizziness. (Patient not taking: Reported on 08/24/2017) 30 tablet 3 Not Taking at Unknown time  . nitroGLYCERIN (NITROSTAT) 0.4 MG SL tablet Place 1 tablet (0.4 mg total) under the tongue every 5 (five) minutes as needed for chest pain. 25 tablet 2 prn at prn  . traMADol (ULTRAM) 50 MG tablet Take by mouth every 6 (six) hours as needed.   prn at prn   Scheduled:  . amiodarone  400 mg Oral BID  . aspirin EC  81 mg Oral Daily  . carvedilol  6.25 mg Oral BID WC  . feeding supplement (ENSURE ENLIVE)  237 mL Oral BID BM  . isosorbide mononitrate  30 mg Oral Daily  . lidocaine  1 patch Transdermal Daily  . lisinopril  10 mg Oral Daily  . metoprolol tartrate  5 mg Intravenous Once  . mirtazapine  7.5 mg Oral QHS  . multivitamin with minerals  1 tablet Oral Daily  . rosuvastatin  5 mg Oral Daily  . venlafaxine XR  37.5 mg Oral Q breakfast   Infusions:  . heparin 800 Units/hr (08/26/17 2026)   PRN: acetaminophen **OR** acetaminophen, albuterol,  nitroGLYCERIN, ondansetron **OR** ondansetron (ZOFRAN) IV, polyethylene glycol, traMADol Anti-infectives    None      Assessment: 83 year old woman requiring heparin gtt for acs  Goal of Therapy:  Heparin level 0.3-0.7 units/ml Monitor platelets by anticoagulation protocol: Yes   Plan:  Give 2600 units bolus x 1 Start heparin infusion at 500 units/hr Check anti-Xa level in 8 hours and daily while on heparin Continue to monitor H&H and platelets   9/8 @2130  HL 0.11, heparin 1300 units bolus, and increase rate to 700 units/hr will recheck in 6 hours.  9/8 @ 0600 HL 0.35 therapeutic. Will continue current rate and will recheck @ 1400.  9/9 @  1500 HL 0.28, heparin bolus of 700 units and increase rate to 800 units/hr.  Recheck in 6 hours   9/9 @ 2100 HL 0.46 therapeutic. Will continue current rate and recheck HL w/ am labs.  9/10 @ 0500 HL 0.48 therapeutic. Will continue current rate and will recheck HL w/ tomorrow am labs.  Thomasene Rippleavid Colleen Kotlarz, PharmD, BCPS Clinical Pharmacist 08/27/2017

## 2017-08-27 NOTE — Progress Notes (Signed)
Pt. Resting comfortably. No complications or concerns at this time.

## 2017-08-27 NOTE — Progress Notes (Signed)
Patient Name: Christina [name] Date of Encounter: 08/27/2017  Primary Cardiologist: [facility]  Hospital Problem List     Active Problems:   NSTEMI (non-ST elevated myocardial infarction) [facility](HCC)     Subjective   No further chest pain. No SOB. Will complete 48 hours of heparin gtt at 11:30 AM this morning. Has not yet ambulated. BP well controlled.   Inpatient Medications    Scheduled Meds: . amiodarone  400 mg Oral BID  . aspirin EC  81 mg Oral Daily  . carvedilol  6.25 mg Oral BID WC  . feeding supplement (ENSURE ENLIVE)  237 mL Oral BID BM  . isosorbide mononitrate  30 mg Oral Daily  . lidocaine  1 patch Transdermal Daily  . lisinopril  10 mg Oral Daily  . metoprolol tartrate  5 mg Intravenous Once  . mirtazapine  7.5 mg Oral QHS  . multivitamin with minerals  1 tablet Oral Daily  . rosuvastatin  5 mg Oral Daily  . venlafaxine XR  37.5 mg Oral Q breakfast   Continuous Infusions: . heparin 800 Units/hr (08/26/17 2026)   PRN Meds: acetaminophen **OR** acetaminophen, albuterol, nitroGLYCERIN, ondansetron **OR** ondansetron (ZOFRAN) IV, polyethylene glycol, traMADol   Vital Signs    Vitals:   08/26/17 1951 08/27/17 0438 08/27/17 0500 08/27/17 0837  BP: (!) 103/56 106/60  124/69  Pulse: 70 65  67  Resp: 17 17  16   Temp: 97.7 F (36.5 C) (!) 97.5 F (36.4 C)  97.8 F (36.6 C)  TempSrc: Oral Oral  Oral  SpO2: 93% 93%  96%  Weight:   98 lb 8 oz (44.7 kg)   Height:        Intake/Output Summary (Last 24 hours) at 08/27/17 0950 Last data filed at 08/27/17 0726  Gross per 24 hour  Intake          1018.32 ml  Output              650 ml  Net           368.32 ml   Filed Weights   08/25/17 0446 08/26/17 0410 08/27/17 0500  Weight: 95 lb 9.6 oz (43.4 kg) 96 lb 9.6 oz (43.8 kg) 98 lb 8 oz (44.7 kg)    Physical Exam    GEN: Elderly and frail appearing, in no acute distress.  HEENT: Grossly normal.  Neck: Supple, no JVD, carotid bruits, or masses. Cardiac: RRR, no  murmurs, rubs, or gallops. No clubbing, cyanosis, edema.  Radials/DP/PT 2+ and equal bilaterally.  Respiratory:  Respirations regular and unlabored, clear to auscultation bilaterally. GI: Soft, nontender, nondistended, BS + x 4. MS: no deformity or atrophy. Skin: warm and dry, no rash. Neuro:  Strength and sensation are intact. Psych: AAOx3.  Normal affect.  Labs    CBC  Recent Labs  08/24/17 1428 08/25/17 0538  WBC 20.6* 12.5*  NEUTROABS  --  9.5*  HGB 13.1 12.1  HCT 39.4 36.1  MCV 90.8 91.2  PLT 275 298   Basic Metabolic Panel  Recent Labs  08/24/17 1457 08/25/17 0538 08/26/17 0553  NA 140 140 138  K 5.4* 4.0 3.8  CL 105 104 100*  CO2 24 27 27   GLUCOSE 172* 117* 99  BUN 20 25* 33*  CREATININE 1.04* 1.00 1.21*  CALCIUM 10.2 9.7 10.2  MG 2.3  --  2.4   Liver Function Tests  Recent Labs  08/24/17 1457  AST 148*  ALT 73*  ALKPHOS 156*  BILITOT 0.8  PROT 7.5  ALBUMIN 3.5   No results for input(s): LIPASE, AMYLASE in the last 72 hours. Cardiac Enzymes  Recent Labs  08/25/17 1051 08/25/17 1611 08/26/17 0008  TROPONINI 13.51* 12.02* 11.07*   BNP Invalid input(s): POCBNP D-Dimer No results for input(s): DDIMER in the last 72 hours. Hemoglobin A1C No results for input(s): HGBA1C in the last 72 hours. Fasting Lipid Panel No results for input(s): CHOL, HDL, LDLCALC, TRIG, CHOLHDL, LDLDIRECT in the last 72 hours. Thyroid Function Tests No results for input(s): TSH, T4TOTAL, T3FREE, THYROIDAB in the last 72 hours.  Invalid input(s): FREET3  Telemetry    NSR - Personally Reviewed  ECG    n/a - Personally Reviewed  Radiology    No results found.  Cardiac Studies   TTE 08/25/2017: Study Conclusions  - Left ventricle: The cavity size was at the upper limits of   normal. There was moderate focal basal hypertrophy. Systolic   function was severely reduced. The estimated ejection fraction   was in the range of 20% to 25%. There is severe  hypokinesis of   the mid-apicalanteroseptal, anterior, anterolateral, and apical   myocardium. - Mitral valve: Calcified annulus. Mildly thickened leaflets .   There was mild regurgitation. - Left atrium: The atrium was mildly dilated. - Right ventricle: The cavity size was normal. Systolic function   was normal.  Patient Profile     83 y.o. female with history of artery disease status post PCI to the ramus and RCA in 2008, peripheral last few disease, hypertension, hyperlipidemia, and multiple falls, admitted with epigastric pain and NSTEMI.  Assessment & Plan    1. NSTEMI/CAD: -Currently symptom-free -Troponin peaked at 13.5 on 9/8, repeat EKG showed likely recent anterior MI c/w echo findings -She will have completed 48 hours of heparin at ~ 1130 AM this morning, stop at that time -Continue ASA, Coreg, Imdur and Crestor -No plans for invasive ischemic evaluation per patient's and family's wishes given her advanced age and comorbidities  2. ICM: -She does not appear volume overloaded at this time -Continue Coreg 6.25 mg bid and lisinopril 10 mg daily  3. PAF: -Currently in NSR, no further episodes of Afi -Magnesium at goal -Replete potassium to goal > 4.0 -Recent TSH from 06/2017 normal -Has been on a heparin gtt while admitted given medical management of her NSTEMI -Even with her elevated CHADS2VASc of 7 she is unlikely to be a good long term, full-dose anticoagulation candidate given her advanced age and frequent falls -Continue amiodarone loading at 400 mg bid x 7 days, 200 mg bid x 7 days, then 200 mg daily thereafter   4. HTN: -Well controlled -Continue current medications  5. HLD: -Crestor 5 mg daily  -Recent lipid panel from 06/2017 with LDL of 54  6. Deconditioning: -Recommend PT   Signed, Eula Listen, PA-C [facility] Pager: [phone] 08/27/2017, 9:50 AM

## 2017-08-27 NOTE — Plan of Care (Signed)
Problem: Activity: Goal: Risk for activity intolerance will decrease Outcome: Progressing [facility]. [facility] and [facility] hands with no complications

## 2017-08-27 NOTE — Care Management Important Message (Signed)
Important Message  Patient Details  Name: Christina RenshawRuth J Staley MRN: [id number] Date of Birth: 01-Jun-1920   Medicare Important Message Given:  Yes  Signed IM notice given  Eber HongGreene, Javarious Elsayed R, RN 08/27/2017, 3:17 PM

## 2017-08-27 NOTE — Progress Notes (Signed)
Iv and tele removed from patient. Discharge instructions given to patient and daughter along with hard copy prescription. Verbalized understanding. No distress at this time. Daughter is at bedside and will transport patient home.

## 2017-08-27 NOTE — Care Management (Signed)
Patient admitted with nstemi.  Had hip surgery with skilled nursing stay in July [address]018.  Currently receiving home health SN PT OT and aide services through [facility].  Notified agency of admission.  Anticipate discharge home.  Daughter will provide in home support and [facility] to resume service. Cardiology is recommending medical management and not to pursue cardiac cath due to age and comorbidities.  Patient is DNR

## 2017-08-27 NOTE — Evaluation (Signed)
Physical Therapy Evaluation Patient Details Name: Christina RenshawRuth J [name] MRN: [id number] DOB: Nov 27, 1920 Today's Date: 08/27/2017   History of Present Illness  Christina BolsterRuth [name]  is a 83 y.o. female with a known history of CAD, hypertension, recent right hip fracture who ambulates with a walker at baseline present it to the hospital after she complained of some lower chest and epigastric pain. Initially EMS was concerned that patient was having an ST elevation MI. Dr. Juliann Paresallwood with cardiology was contacted who thought the EKG did not show MI. Later patient's troponin returned at 1.1. At this time patient and family, after discussing with Dr. York CeriseForbach in the emergency room, have decided for conservative medical management and not moving ahead with catheterization. No heparin drip. Currently admitted for NSTEMI/CAD as well as a fib and hyperkalemia.   Clinical Impression  Pt admitted with above diagnosis. Pt currently with functional limitations due to the deficits listed below (see PT Problem List). Pt is modified independent with bed mobility. She demonstrates poor anterior weight shifting and requires cues for safe hand placement during transfers. Once upright she falls backwards and supports backs of knees on bed. MinA+1 from therapist to stabilize in standing due to poor balance. Pt ambulates a full lap around RN station with therapist. She is initially unsteady after transfer but once she starts walking does not require any support from therapist to stabilize. HR in the 60's/70's while ambulating and SaO2>90% on room air. Pt denies DOE with ambulation. Gait speed is decreased but functional for household ambulation. She requires UE support for stepping and to place feet together. History of multiple falls with recent hip fracture in July of this year. Her current balance is not a deviation from her baseline. Emphasized guarding by family with ambulation and safety measures at home. She should resume HH PT, OT, RN, and aid  after discharge. Pt will benefit from PT services to address deficits in strength, balance, and mobility in order to return to full function at home.      Follow Up Recommendations Home health PT;Other (comment);Supervision for mobility/OOB (Resume HH PT, OT, RN, and aid at discharge)    Equipment Recommendations  None recommended by PT;Other (comment) (Continue to use walker for ambulation)    Recommendations for Other Services       Precautions / Restrictions Precautions Precautions: Fall Restrictions Weight Bearing Restrictions: No      Mobility  Bed Mobility Overal bed mobility: Modified Independent             General bed mobility comments: HOB elevated and use of bed rails. Increased time required to perform but no cues or external assist  Transfers Overall transfer level: Needs assistance Equipment used: Rolling walker (2 wheeled) Transfers: Sit to/from Stand Sit to Stand: Min assist         General transfer comment: Pt with poor anterior weight shifting and requires cues for safe hand placement. Once upright she falls backwards and supports backs of knees on bed. MinA+1 from therapist to stabilize in standing due to poor balance.   Ambulation/Gait Ambulation/Gait assistance: Min guard Ambulation Distance (Feet): 200 Feet Assistive device: Rolling walker (2 wheeled) Gait Pattern/deviations: Decreased step length - right;Decreased step length - left Gait velocity: Decreased but functional for full household mobility Gait velocity interpretation: <1.8 ft/sec, indicative of risk for recurrent falls General Gait Details: Pt ambulates a full lap around RN station with therapist. She is initially unsteady after transfer but once she starts walking does not require any  support from therapist to stabilize. HR in the 60's/70's while ambulating and SaO2>90% on room air. Pt denies DOE with ambulation.   Stairs            Wheelchair Mobility    Modified Rankin  (Stroke Patients Only)       Balance Overall balance assessment: Needs assistance Sitting-balance support: No upper extremity supported Sitting balance-Leahy Scale: Good     Standing balance support: No upper extremity supported Standing balance-Leahy Scale: Fair Standing balance comment: Able to maintain balance with feet apart. Requires UE support to place feet together                             Pertinent Vitals/Pain Pain Assessment: No/denies pain    Home Living Family/patient expects to be discharged to:: Private residence Living Arrangements: Alone (Daughter frequentlys tays with her) Available Help at Discharge: Family;Available 24 hours/day;Available PRN/intermittently;Other (Comment);Home health (Has been receiving HH PT, OT, RN, and aid) Type of Home: House Home Access: Stairs to enter Entrance Stairs-Rails: Can reach both Entrance Stairs-Number of Steps: 3 Home Layout: One level Home Equipment: Bedside commode;Shower seat;Walker - 2 wheels;Walker - 4 wheels;Grab bars - tub/shower      Prior Function Level of Independence: Needs assistance   Gait / Transfers Assistance Needed: Pt ambulates with rolling walker vs rollator. States that she stays at home and doesn't go out. History of multiple falls with hip fracture in July of this year followed by SNF stay  ADL's / Homemaking Assistance Needed: Independent with ADLs but rquiring assistance from family with IADLs  Comments: Independent with ADLs, and IADLs.     Hand Dominance   Dominant Hand: Right    Extremity/Trunk Assessment   Upper Extremity Assessment Upper Extremity Assessment: Generalized weakness;LUE deficits/detail LUE Deficits / Details: L shoulder pain with AROM flexion but this is chronic    Lower Extremity Assessment Lower Extremity Assessment: Generalized weakness       Communication   Communication: No difficulties  Cognition Arousal/Alertness: Awake/alert Behavior During  Therapy: WFL for tasks assessed/performed Overall Cognitive Status: Difficult to assess                                 General Comments: Pt is AOx2, oriented to year and easily cued for month but somewhat slow to respond      General Comments      Exercises     Assessment/Plan    PT Assessment Patient needs continued PT services  PT Problem List Decreased strength;Decreased balance;Decreased mobility;Decreased activity tolerance;Decreased safety awareness       PT Treatment Interventions DME instruction;Gait training;Stair training;Functional mobility training;Therapeutic activities;Therapeutic exercise;Balance training;Neuromuscular re-education;Patient/family education    PT Goals (Current goals can be found in the Care Plan section)  Acute Rehab PT Goals Patient Stated Goal: Return to prior level of function at home PT Goal Formulation: With patient/family Time For Goal Achievement: 09/10/17 Potential to Achieve Goals: Fair    Frequency Min 2X/week   Barriers to discharge Decreased caregiver support Lives alone but family is supportive and daughter will stay with patient as much as needed    Co-evaluation               AM-PAC PT "6 Clicks" Daily Activity  Outcome Measure Difficulty turning over in bed (including adjusting bedclothes, sheets and blankets)?: None Difficulty moving from lying on back to  sitting on the side of the bed? : A Little Difficulty sitting down on and standing up from a chair with arms (e.g., wheelchair, bedside commode, etc,.)?: Unable Help needed moving to and from a bed to chair (including a wheelchair)?: A Little Help needed walking in hospital room?: A Little Help needed climbing 3-5 steps with a railing? : A Little 6 Click Score: 17    End of Session Equipment Utilized During Treatment: Gait belt Activity Tolerance: Patient tolerated treatment well Patient left: in bed;with call bell/phone within reach;with bed  alarm set;with family/visitor present Nurse Communication: Mobility status;Other (comment) (DC recommendations) PT Visit Diagnosis: Unsteadiness on feet (R26.81);Repeated falls (R29.6);Muscle weakness (generalized) (M62.81);History of falling (Z91.81);Difficulty in walking, not elsewhere classified (R26.2)    Time: 1610-9604 PT Time Calculation (min) (ACUTE ONLY): 19 min   Charges:   PT Evaluation $PT Eval Low Complexity: 1 Low     PT G Codes:   PT G-Codes **NOT FOR INPATIENT CLASS** Functional Assessment Tool Used: AM-PAC 6 Clicks Basic Mobility Functional Limitation: Mobility: Walking and moving around Mobility: Walking and Moving Around Current Status (V4098): At least 40 percent but less than 60 percent impaired, limited or restricted Mobility: Walking and Moving Around Goal Status [phone]): At least 20 percent but less than 40 percent impaired, limited or restricted    Lynnea Maizes PT, DPT    Marqui Formby 08/27/2017, 3:08 PM

## 2017-08-28 ENCOUNTER — Telehealth: Payer: Self-pay

## 2017-08-28 NOTE — Discharge Summary (Addendum)
[facility] - [place] at [facility]   PATIENT NAME: Christina [name]    MR#:  [id number]  DATE OF BIRTH:  [birth year]  DATE OF ADMISSION:  08/24/2017 ADMITTING PHYSICIAN: Milagros LollSrikar Loron Weimer, MD  DATE OF DISCHARGE: 08/27/2017  4:23 PM  PRIMARY CARE PHYSICIAN: Karie SchwalbeLetvak, Richard I, MD   ADMISSION DIAGNOSIS:  Atypical chest pain [R07.89] NSTEMI (non-ST elevated myocardial infarction) (HCC) [I21.4]  DISCHARGE DIAGNOSIS:  Active Problems:   NSTEMI (non-ST elevated myocardial infarction) (HCC)   SECONDARY DIAGNOSIS:   Past Medical History:  Diagnosis Date  . CAD (coronary artery disease)    a. S/P previous MI;  b. 04/2007 Cath/PCI: Taxus DES' to mid/distal RCA and RPDA as well as Ramus;  c. myoview 8/09: EF 78%, normal perfusion  . Carotid stenosis    Bilateral, mild to moderate  . Chronic back pain   . Chronic shoulder pain   . Hyperlipidemia    Intolerant of many statins  . Hypertension   . Mild mitral and aortic regurgitation    a. 11/2011 Echo: EF 55-65%, No RWMA, Gr 1 DD, Mild AI/MR.  Marland Kitchen. Neuropathy   . Osteoarthritis   . Osteoporosis   . Pelvic fracture (HCC) 6/16  . Peripheral vascular disease (HCC)      ADMITTING HISTORY  HISTORY OF PRESENT ILLNESS:  Christina BolsterRuth Rowe  is a 83 y.o. female with a known history of CAD, hypertension, recent right hip fracture who ambulates with a walker at baseline present it to the hospital after she complained of some lower chest and epigastric pain. Initially EMS was concerned that patient was having an ST elevation MI. [facility] who thought the EKG did not show MI. Later patient's troponin returned at 1.1. At this time patient and family after discussing with Dr. York CeriseForbach in the emergency room have decided regarding conservative medical management and not moving ahead with catheterization. No heparin drip. She still complains of mild left chest pain. No shortness of breath or vomiting.   HOSPITAL COURSE:    * pAfib Started amiodarone In NSR today. Discharged home on 200 mg daily. Aspirin. No other anticoagulants due to bleeding risk from falls.  * NSTEMI Discussed with Dr. Okey DupreEnd and Dr. Kirke CorinArida No cath. On heparin drip Echocardiogram with significant decrease in ejection fraction to 25%. Likely has LAD disease. Patient is on aspirin, Coreg and lisinopril. No Plavix  * Leukocytosis.etiology unclear No signs of infection. Likely stress reaction.  Improved  * Hyperkalemia.  resolved  * Hypertension. Continue Coreg and Imdur. Lisinopril Coreg dose increased   * DVT prophylaxis On heparin drip during the hospital stay  Patient seen by physical therapy. She has been set up with home health physical therapy, nursing. Also referral made for cardiac rehabilitation.  Due to decrease in ejection fraction patient does have more fatigue at this time. We'll work with therapy and follow-up with cardiology as outpatient.  Stable for discharge home.  Cardiac Discharge   Aspirin prescribed at discharge:  Yes  High Intensity Statin Prescribed? (Lipitor 40-80mg  or Crestor 20-40mg ): Yes  Beta Blocker Prescribed: Yes  ADP Receptor Inhibitor Prescribed? (i.e. Plavix etc.-Includes Medically Managed Patients): No: Due to bleeding risk from falls  ACEI/ARB Prescribed? (If NO document contraindications)  Yes  Aldosterone Inhibitor Prescribed? No:   Was EF assessed during THIS hospitalization? Yes  (YES = Measured in current episode of care or document plan to evaluate after discharge.)  Was EF < 40% ? Yes  Was Cardiac Rehab  II ordered? (Includes Medically managed Patients): Yes  Was Smoking Cessation Advice provided?  No: Non smoker      CONSULTS OBTAINED:  Treatment Team:  Yvonne Kendall, MD  DRUG ALLERGIES:   Allergies  Allergen Reactions  . Bee Venom Rash  . Plavix [Clopidogrel] Rash  . Atorvastatin Other (See Comments)    Reaction:  Blisters on feet   . Diltiazem  Hcl Other (See Comments)    Reaction:  Unknown   . Dye Fdc Red [Red Dye] Other (See Comments)    Reaction:  Unknown   . Gemfibrozil Other (See Comments)    Reaction:  Unknown   . Niacin Other (See Comments)    Reaction:  Unknown   . Sulfasalazine Rash    DISCHARGE MEDICATIONS:   Discharge Medication List as of 08/27/2017  3:52 PM    START taking these medications   Details  amiodarone (PACERONE) 400 MG tablet Take 0.5 tablets (200 mg total) by mouth daily., Starting Mon 08/27/2017, Print      CONTINUE these medications which have CHANGED   Details  carvedilol (COREG) 6.25 MG tablet Take 1 tablet (6.25 mg total) by mouth 2 (two) times daily with a meal., Starting Mon 08/27/2017, Print      CONTINUE these medications which have NOT CHANGED   Details  aspirin EC 81 MG tablet Take 81 mg by mouth daily. , Historical Med    Cholecalciferol (VITAMIN D3) 2000 units capsule Take 2,000 Units by mouth daily., Historical Med    isosorbide mononitrate (IMDUR) 30 MG 24 hr tablet TAKE 1 TABLET BY MOUTH  DAILY, Normal    lisinopril (PRINIVIL,ZESTRIL) 10 MG tablet TAKE 1 TABLET BY MOUTH  DAILY, Normal    MEGARED OMEGA-3 KRILL OIL 500 MG CAPS Take 1 capsule by mouth daily. , Historical Med    mirtazapine (REMERON) 7.5 MG tablet Take 7.5 mg by mouth at bedtime. , Historical Med    Multiple Vitamins-Minerals (MULTIVITAL) tablet Take 1 tablet by mouth daily., Historical Med    rosuvastatin (CRESTOR) 5 MG tablet Take 5 mg by mouth daily., Starting Fri 06/01/2017, Historical Med    venlafaxine XR (EFFEXOR-XR) 37.5 MG 24 hr capsule Take 37.5 mg by mouth daily with breakfast., Historical Med    acetaminophen (TYLENOL 8 HOUR) 650 MG CR tablet Take 650 mg by mouth every 8 (eight) hours as needed for pain. Do not exceed 3000 mg / 24 hrs of acetaminophen. Consider all sources, Historical Med    ibandronate (BONIVA) 150 MG tablet Take 150 mg by mouth every 30 (thirty) days. Take in the morning with a  full glass of water, on an empty stomach, and do not take anything else by mouth or lie down for the next 30 min., Historical Med    Lidocaine (ASPERCREME LIDOCAINE) 4 % PTCH Apply 1 patch topically daily. Apply to lower back/sacrum for pain. Remove after 12 hours., Historical Med    meclizine (ANTIVERT) 25 MG tablet Take 1 tablet (25 mg total) by mouth 3 (three) times daily as needed for dizziness., Starting Mon 05/01/2016, Normal    nitroGLYCERIN (NITROSTAT) 0.4 MG SL tablet Place 1 tablet (0.4 mg total) under the tongue every 5 (five) minutes as needed for chest pain., Starting Wed 08/23/2016, Normal    traMADol (ULTRAM) 50 MG tablet Take by mouth every 6 (six) hours as needed., Historical Med        Today   VITAL SIGNS:  Blood pressure 124/69, pulse 67, temperature 97.8 F (36.6  C), temperature source Oral, resp. rate 16, height  (1.549 m), weight 44.7 kg (98 lb 8 oz), SpO2 96 %.  I/O:  No intake or output data in the 24 hours ending 08/28/17 1525  PHYSICAL EXAMINATION:  Physical Exam  GENERAL:  83 y.o.-year-old patient lying in the bed with no acute distress.  LUNGS: Normal breath sounds bilaterally, no wheezing, rales,rhonchi or crepitation. No use of accessory muscles of respiration.  CARDIOVASCULAR: S1, S2 normal. No murmurs, rubs, or gallops.  ABDOMEN: Soft, non-tender, non-distended. Bowel sounds present. No organomegaly or mass.  NEUROLOGIC: Moves all 4 extremities. PSYCHIATRIC: The patient is alert and oriented x 3.  SKIN: No obvious rash, lesion, or ulcer.   DATA REVIEW:   CBC  Recent Labs Lab 08/25/17 0538  WBC 12.5*  HGB 12.1  HCT 36.1  PLT 298    Chemistries   Recent Labs Lab 08/24/17 1457  08/26/17 0553  NA 140  < > 138  K 5.4*  < > 3.8  CL 105  < > 100*  CO2 24  < > 27  GLUCOSE 172*  < > 99  BUN 20  < > 33*  CREATININE 1.04*  < > 1.21*  CALCIUM 10.2  < > 10.2  MG 2.3  --  2.4  AST 148*  --   --   ALT 73*  --   --   ALKPHOS 156*  --    --   BILITOT 0.8  --   --   < > = values in this interval not displayed.  Cardiac Enzymes  Recent Labs Lab 08/26/17 0008  TROPONINI 11.07*    Microbiology Results  Results for orders placed or performed during the hospital encounter of 07/29/17  C difficile quick scan w PCR reflex     Status: Abnormal   Collection Time: 07/29/17  6:35 AM  Result Value Ref Range Status   C Diff antigen POSITIVE (A) NEGATIVE Final   C Diff toxin NEGATIVE NEGATIVE Final   C Diff interpretation Results are indeterminate. See PCR results.  Final  Clostridium Difficile by PCR     Status: Abnormal   Collection Time: 07/29/17  6:35 AM  Result Value Ref Range Status   Toxigenic C Difficile by pcr POSITIVE (A) NEGATIVE Final    Comment: Positive for toxigenic C. difficile with little to no toxin production. Only treat if clinical presentation suggests symptomatic illness.    RADIOLOGY:  No results found.  Follow up with PCP in 1 week.  Management plans discussed with the patient, family and they are in agreement.  CODE STATUS:  Code Status History    Date Active Date Inactive Code Status Order ID Comments User Context   08/24/2017  4:05 PM 08/27/2017  7:29 PM DNR [id number]  Milagros Loll, MD ED   06/18/2017  3:43 PM 06/20/2017  5:16 PM DNR [id number]  Enid Baas, MD ED   05/17/2016  3:17 PM 05/22/2016  9:05 PM DNR [id number]  Ramonita Lab, MD Inpatient   05/17/2016 12:43 PM 05/17/2016 12:43 PM Full Code [id number]  [name], MD ED   07/02/2015  1:50 PM 07/03/2015  3:33 PM DNR [id number]  Altamese Dilling, MD Inpatient   07/02/2015 11:29 AM 07/02/2015  1:50 PM Full Code [id number]  Altamese Dilling, MD Inpatient   06/09/2015  6:35 PM 06/11/2015  2:50 PM DNR [id number]  Auburn Bilberry, MD Inpatient    Questions for Most Recent Historical Code Status (Order [id number])    Question  Answer Comment   In the event of cardiac or respiratory ARREST Do not call a "code blue"    In the event of  cardiac or respiratory ARREST Do not perform Intubation, CPR, defibrillation or ACLS    In the event of cardiac or respiratory ARREST Use medication by any route, position, wound care, and other measures to relive pain and suffering. May use oxygen, suction and manual treatment of airway obstruction as needed for comfort.         Advance Directive Documentation     Most Recent Value  Type of Advance Directive  Healthcare Power of Attorney  Pre-existing out of facility DNR order (yellow form or pink MOST form)  Yellow form placed in chart (order not valid for inpatient use)  "MOST" Form in Place?  -      TOTAL TIME TAKING CARE OF THIS PATIENT ON DAY OF DISCHARGE: more than 30 minutes.   Milagros Loll R M.D on 08/28/2017 at 3:25 PM  Between 7am to 6pm - Pager - [phone]  After 6pm go to [email or web address] - password [email or web address]  [facility]  Office  [phone]  CC: Primary care physician; Karie Schwalbe, MD  Note: This dictation was prepared with Dragon dictation along with smaller phrase technology. Any transcriptional errors that result from this process are unintentional.

## 2017-08-28 NOTE — Telephone Encounter (Signed)
Spoke with patients daughter, Carney BernJean.  Transition Care Management Follow-up Telephone Call   Date discharged? 08/27/17   How have you been since you were released from the hospital? "She's a little bit sleepy, weak and little appetite"   Do you understand why you were in the hospital? yes   Do you understand the discharge instructions? yes   Where were you discharged to? Home. Lives with daughter.    Items Reviewed:  Medications reviewed: no, daughter did not have medications/list at time of call  Allergies reviewed: no  Dietary changes reviewed: yes. Daughter reports no appetite, encouraged to continue fluid intake and attempt meal replacement (Ensure/Boost).   Referrals reviewed: yes. OT to visit today.    Functional Questionnaire:   Activities of Daily Living (ADLs):   She states they are independent in the following: None. Walks with walker (with supervision) States they require assistance with the following: ambulation, bathing and hygiene, feeding, continence, grooming, toileting and dressing   Any transportation issues/concerns?: yes. Daughter feels pt is too weak to travel   Any patient concerns? Yes. Decreased appetite. Questions regarding Remeron and Effexor, not sure if she is to get refilled. Will discuss with Dr. Alphonsus SiasLetvak.    Confirmed importance and date/time of follow-up visits scheduled yes  Daughter requesting home visit from PCP.  Confirmed with patient if condition begins to worsen call PCP or go to the ER.  Patient was given the office number and encouraged to call back with question or concerns.  : yes

## 2017-08-29 NOTE — Telephone Encounter (Signed)
I will probably be able to make it out in the next week or 2. Please let Carney BernJean know

## 2017-08-29 NOTE — Telephone Encounter (Signed)
Left message on Jean's cell that Dr Alphonsus SiasLetvak will contact her about a home visit for 1-2 weeks.

## 2017-08-30 ENCOUNTER — Telehealth: Payer: Self-pay

## 2017-08-30 DIAGNOSIS — I2510 Atherosclerotic heart disease of native coronary artery without angina pectoris: Secondary | ICD-10-CM | POA: Diagnosis not present

## 2017-08-30 DIAGNOSIS — M549 Dorsalgia, unspecified: Secondary | ICD-10-CM | POA: Diagnosis not present

## 2017-08-30 DIAGNOSIS — S72142D Displaced intertrochanteric fracture of left femur, subsequent encounter for closed fracture with routine healing: Secondary | ICD-10-CM | POA: Diagnosis not present

## 2017-08-30 DIAGNOSIS — I10 Essential (primary) hypertension: Secondary | ICD-10-CM | POA: Diagnosis not present

## 2017-08-30 DIAGNOSIS — E785 Hyperlipidemia, unspecified: Secondary | ICD-10-CM | POA: Diagnosis not present

## 2017-08-30 NOTE — Telephone Encounter (Signed)
Christina [name] called , gave her the information below. No cb needed

## 2017-08-30 NOTE — Telephone Encounter (Signed)
That is okay Let her know

## 2017-08-30 NOTE — Telephone Encounter (Signed)
Sammi.NeptuneKeisa nurse with [facility] left v/m that [facility] would [facility] and request cb that Dr Alphonsus SiasLetvak received this message and Dr Alphonsus SiasLetvak is OK with this.

## 2017-08-30 NOTE — Telephone Encounter (Signed)
Christina [name] nurse with [facility] left v/m; [facility] received reduction of care orders earlier this month and if do not see pt after 48 hrs need to contact PCP about update of orders. RN could go out today to see pt and resume pts HH orders if Oked by Dr Alphonsus SiasLetvak. [name] request cb today.

## 2017-09-03 ENCOUNTER — Telehealth: Payer: Self-pay | Admitting: *Deleted

## 2017-09-03 ENCOUNTER — Other Ambulatory Visit: Payer: Self-pay | Admitting: *Deleted

## 2017-09-03 DIAGNOSIS — E785 Hyperlipidemia, unspecified: Secondary | ICD-10-CM | POA: Diagnosis not present

## 2017-09-03 DIAGNOSIS — M549 Dorsalgia, unspecified: Secondary | ICD-10-CM | POA: Diagnosis not present

## 2017-09-03 DIAGNOSIS — S72142D Displaced intertrochanteric fracture of left femur, subsequent encounter for closed fracture with routine healing: Secondary | ICD-10-CM | POA: Diagnosis not present

## 2017-09-03 DIAGNOSIS — I2510 Atherosclerotic heart disease of native coronary artery without angina pectoris: Secondary | ICD-10-CM | POA: Diagnosis not present

## 2017-09-03 DIAGNOSIS — I10 Essential (primary) hypertension: Secondary | ICD-10-CM | POA: Diagnosis not present

## 2017-09-03 NOTE — Patient Outreach (Addendum)
[facility] Care Management  09/03/2017  Christina [name] 02-07-1920 [id number]  EMMI- General Discharge RED ON EMMI ALERT DAY#: 4 DATE: 09/01/17 RED ALERT: Read discharge papers? No Lost interest in things? Yes   Outreach attempt to patient. HIPAA verified with daughter [name]). Per [name], patient has difficulty hearing over the phone. The automated phone call questionnaire was answered by [name]. Patient lives with her children and they are her primary caregivers. [name] stated, her and her brother are both aging and has medical problems. Patient was in the hospital from 08/24/17 to 08/27/17 diagnosed with a NSTEMI [name] confirmed receiving hospital discharge paperwork. [name] reported, "The paperwork was not reviewed in detail with her or the patient". [name] voiced her concerns about 2 new discharge medication changes. [name] believes Amiodarone and Carvedilol is causing the patient to be extremely tired and weak. Per [name], the patient's blood pressure is not being monitored at home. RN CM encouraged [name] to make an appointment with patient's Cardiologist and she agreed. [name] stated, PT from [facility] plans to visit patient today, 09/03/17. RN CM informed [name] to ask the physical therapist to check patient's blood pressure. [facility] services include RN and OT. [name] discussed how patient has lost 14 to 15 pounds in the last few months. Patient has tried different supplements, however unable to tolerate them. Patient has a history of constipation. The family is attempting to encourage patient to eat more foods with fiber, per [name]. [name] reported, patient has fallen 3 or more times in a year. Per EMR, patient had sustained a hip fracture on 06/17/17 due to a fall. "Patient had another fall, without injury, during her stay at rehab", per [name]. Patient has an appointment with her primary MD on 09/05/17.   Plan: RN CM will send referral to [facility] RN for further in home eval/assessment of  care needs and management of chronic conditions. RN CM will send [facility] SW referral for possible assistance with community resources related to personal care service. Patient had greater than 3 falls with injury within the last year. Her caregivers are aging and has multiple medical issues.  RN CM will send [facility] pharmacy referral for polypharmacy med review-patient taking greater than 10 medications. [name] is concerned about hospital discharge medication changes. RN CM advised patient to contact RNCM for any needs or concerns.  Wynelle Cleveland, RN, BSN, MHA/MSL, [facility] Telephonic Care Manager Coordinator [facility] Direct Phone: [phone] Toll Free: [phone] Fax: [phone]

## 2017-09-03 NOTE — Telephone Encounter (Signed)
-----   Message from Coralee Rud sent at 09/03/2017 12:27 PM EDT ----- Regarding: tcm/ph 9/18 9:30 Nicolasa Ducking, NP

## 2017-09-03 NOTE — Telephone Encounter (Signed)
Patient contacted regarding discharge from blank on blank.   Patient understands to follow up with provider ? On 09/04/17 at 9:30 am at [facility] with Ward Givens, NP.  Patient understands discharge instructions? Yes  Patient understands medications and regiment? Yes  Patient understands to bring all medications to this visit? Yes   Daughter concerned that the amiodarone and increased dose of carvedilol may be making her more sluggish. She will discuss this with Ward Givens, NP tomorrow at appointment.

## 2017-09-04 ENCOUNTER — Ambulatory Visit (INDEPENDENT_AMBULATORY_CARE_PROVIDER_SITE_OTHER): Payer: Medicare Other | Admitting: Nurse Practitioner

## 2017-09-04 ENCOUNTER — Encounter: Payer: Self-pay | Admitting: Nurse Practitioner

## 2017-09-04 ENCOUNTER — Encounter: Payer: Self-pay | Admitting: Internal Medicine

## 2017-09-04 ENCOUNTER — Telehealth: Payer: Self-pay | Admitting: Internal Medicine

## 2017-09-04 VITALS — BP 96/54 | HR 62 | Ht 59.0 in | Wt 103.5 lb

## 2017-09-04 DIAGNOSIS — E785 Hyperlipidemia, unspecified: Secondary | ICD-10-CM | POA: Diagnosis not present

## 2017-09-04 DIAGNOSIS — I255 Ischemic cardiomyopathy: Secondary | ICD-10-CM | POA: Diagnosis not present

## 2017-09-04 DIAGNOSIS — I480 Paroxysmal atrial fibrillation: Secondary | ICD-10-CM

## 2017-09-04 DIAGNOSIS — I10 Essential (primary) hypertension: Secondary | ICD-10-CM | POA: Diagnosis not present

## 2017-09-04 DIAGNOSIS — I2510 Atherosclerotic heart disease of native coronary artery without angina pectoris: Secondary | ICD-10-CM | POA: Diagnosis not present

## 2017-09-04 DIAGNOSIS — I5022 Chronic systolic (congestive) heart failure: Secondary | ICD-10-CM

## 2017-09-04 DIAGNOSIS — I214 Non-ST elevation (NSTEMI) myocardial infarction: Secondary | ICD-10-CM

## 2017-09-04 MED ORDER — CARVEDILOL 3.125 MG PO TABS: 3.1250 mg | ORAL_TABLET | Freq: Two times a day (BID) | ORAL | 3 refills | Status: DC

## 2017-09-04 NOTE — Patient Instructions (Addendum)
Medication Instructions:  Please decrease your Carvedilol to 3.125 mg twice daily  Labwork: None  Testing/Procedures: None  Follow-Up: 2 months with Dr. Kirke Corin   If you need a refill on your cardiac medications before your next appointment, please call your pharmacy.

## 2017-09-04 NOTE — Telephone Encounter (Signed)
That is okay.

## 2017-09-04 NOTE — Telephone Encounter (Signed)
Lm on Wes' vm and provided verbal orders 

## 2017-09-04 NOTE — Progress Notes (Signed)
Office Visit    Patient Name: Christina [name] Date of Encounter: 09/04/2017  Primary Care Provider:  Karie Schwalbe, MD Primary Cardiologist:  Judie Petit. Kirke Corin, MD   Chief Complaint    83 year old female with a prior history of CAD, peripheral arterial disease, hypertension, hyperlipidemia, and falls status post prior hip replacements, who presents today for follow-up after recent hospitalization secondary to non-STEMI.  Past Medical History    Past Medical History:  Diagnosis Date  . CAD (coronary artery disease)    a. S/P previous MI;  b. 04/2007 Cath/PCI: Taxus DES' to mid/distal RCA and RPDA as well as Ramus;  c. myoview 8/09: EF 78%, normal perfusion; d. 12/2012 Low risk MV; e. 08/2017 NSTEMI->Med Rx.  . Carotid stenosis    Bilateral, mild to moderate  . Chronic back pain   . Chronic shoulder pain   . HFrEF (heart failure with reduced ejection fraction) (HCC)    a. 08/2017 Echo: EF 20-25%, mid-apicalanteroseptal, ant, antlat, and apical sev HK. Mild MR, mildly dil LA, nl RV fxn.  . Hyperlipidemia    Intolerant of many statins  . Hypertension   . Ischemic cardiomyopathy    a. 08/2017 Echo: EF 20-25%, mid-apicalanteroseptal, ant, antlat, and apical sev HK.  . Mild mitral and aortic regurgitation    a. 11/2011 Echo: EF 55-65%, No RWMA, Gr 1 DD, Mild AI/MR; b. 08/2017 Echo: Mild MR.  Marland Kitchen Neuropathy   . Osteoarthritis   . Osteoporosis   . PAF (paroxysmal atrial fibrillation) (HCC)    a. 08/2017 - noted @ time of NSTEMI. CHA2DS2VASc = 5-->ASA only due to age and h/o falls;  c. On Amio.  Marland Kitchen Pelvic fracture (HCC) 6/16  . Peripheral vascular disease [facility])    Past Surgical History:  Procedure Laterality Date  . ADENOSINE MYOVIEW  01/2006   No ischemia, EF 70%  . BREAST BIOPSY     Bilateral  . CAROTID U/S  10/2006   0-39% bilaterally  . CATARACT EXTRACTION  09/2006   Right  . CORONARY ANGIOPLASTY  12/2002   Medical rx only  . CORONARY ANGIOPLASTY  01/2006   LAD/PAD disease  .  CORONARY ANGIOPLASTY  03/2007   3 vessel disease  . CORONARY STENT PLACEMENT  2002   MI, [facility]  . CORONARY STENT PLACEMENT  04/2007   RCA/LCX multiple, [facility]  . CP ADMIT  09/2005   Stress myoview negative  . FEMORAL HERNIA REPAIR  05/2007   Incarcerated, right  . FRACTURE SURGERY  03/2009   Left leg  . INTRAMEDULLARY (IM) NAIL INTERTROCHANTERIC Right 05/18/2016   Procedure: INTRAMEDULLARY (IM) NAIL INTERTROCHANTRIC;  Surgeon: [name], MD;  Location: [facility] ORS;  Service: Orthopedics;  Laterality: Right;  . INTRAMEDULLARY (IM) NAIL INTERTROCHANTERIC Left 06/18/2017   Procedure: INTRAMEDULLARY (IM) NAIL INTERTROCHANTRIC;  Surgeon: Deeann Saint, MD;  Location: [facility] ORS;  Service: Orthopedics;  Laterality: Left;  . NM MYOVIEW LTD  11/2004   Stress, negative; NL EF  . VESICOVAGINAL FISTULA CLOSURE W/ TAH      Allergies  Allergies  Allergen Reactions  . Bee Venom Rash  . Plavix [Clopidogrel] Rash  . Atorvastatin Other (See Comments)    Reaction:  Blisters on feet   . Diltiazem Hcl Other (See Comments)    Reaction:  Unknown   . Dye Fdc Red [Red Dye] Other (See Comments)    Reaction:  Unknown   . Gemfibrozil Other (See Comments)    Reaction:  Unknown   . Niacin  Other (See Comments)    Reaction:  Unknown   . Sulfasalazine Rash    History of Present Illness    83 year old female with the above complex past medical history including coronary artery disease status post prior stenting to the RCA, RPDA, and ramus intermedius in 2008, hypertension, hyperlipidemia, peripheral arterial disease, and falls with hip and pelvic fractures. She was recently admitted to [facility] and diagnosed with non-STEMI. Trop peaked @ 13.  Decision was made to pursue conservative therapy. Echocardiogram showed LV dysfunction with an EF of 20-25%. She was also noted to have nonsustained VT and paroxysmal atrial fibrillation. She was placed on amiodarone therapy without any further significant  ectopy. She was not placed on oral anticoagulation in the setting of prior history of falls. She was discharged home on beta blocker, ACE inhibitor, nitrate, and aspirin therapy. Since discharge, she says she has had good and bad days. She has not had any significant chest pain or dyspnea. She further denies PND, orthopnea, syncope, or edema. She has had some lightheadedness with standing which is a new symptom for her. Notably, her carvedilol dose was doubled during her hospitalization. She worked with physical therapy yesterday and had some unsteadiness. This morning, she notes right lower back and pelvic pain.  Home Medications    Prior to Admission medications   Medication Sig Start Date End Date Taking? Authorizing Provider  acetaminophen (TYLENOL 8 HOUR) 650 MG CR tablet Take 650 mg by mouth every 8 (eight) hours as needed for pain. Do not exceed 3000 mg / 24 hrs of acetaminophen. Consider all sources   Yes [provider]  amiodarone (PACERONE) 200 MG tablet Take 200 mg by mouth daily.   Yes [provider]  aspirin EC 81 MG tablet Take 81 mg by mouth daily.    Yes [provider]  carvedilol (COREG) 3.125 MG tablet Take 1 tablet (3.125 mg total) by mouth 2 (two) times daily with a meal. 09/04/17  Yes Ok Anis, NP  Cholecalciferol (VITAMIN D3) 2000 units capsule Take 2,000 Units by mouth daily.   Yes [provider]  ibandronate (BONIVA) 150 MG tablet Take 150 mg by mouth every 30 (thirty) days. Take in the morning with a full glass of water, on an empty stomach, and do not take anything else by mouth or lie down for the next 30 min.   Yes [provider]  isosorbide mononitrate (IMDUR) 30 MG 24 hr tablet TAKE 1 TABLET BY MOUTH  DAILY 12/07/16  Yes Iran Ouch, MD  Lidocaine (ASPERCREME LIDOCAINE) 4 % PTCH Apply 1 patch topically daily. Apply to lower back/sacrum for pain. Remove after 12 hours.   Yes [provider]    lisinopril (PRINIVIL,ZESTRIL) 10 MG tablet TAKE 1 TABLET BY MOUTH  DAILY 01/26/17  Yes Tillman Abide I, MD  MEGARED OMEGA-3 KRILL OIL 500 MG CAPS Take 1 capsule by mouth daily.    Yes [provider]  mirtazapine (REMERON) 7.5 MG tablet Take 7.5 mg by mouth at bedtime.    Yes [provider]  Multiple Vitamins-Minerals (MULTIVITAL) tablet Take 1 tablet by mouth daily.   Yes [provider]  nitroGLYCERIN (NITROSTAT) 0.4 MG SL tablet Place 1 tablet (0.4 mg total) under the tongue every 5 (five) minutes as needed for chest pain. 08/23/16  Yes Iran Ouch, MD  rosuvastatin (CRESTOR) 5 MG tablet Take 5 mg by mouth daily. 06/01/17  Yes [provider]  traMADol Janean Sark) 50  MG tablet Take by mouth every 6 (six) hours as needed.   Yes [provider]  venlafaxine XR (EFFEXOR-XR) 37.5 MG 24 hr capsule Take 37.5 mg by mouth daily with breakfast.   Yes [provider]    Review of Systems    She has had good days and bad days. She is disappointed that she currently doesn't always have the energy to complete tasks. She denies chest pain, dyspnea, PND, orthopnea, syncope, edema, or early satiety. She has had some lightheadedness with standing.  All other systems reviewed and are otherwise negative except as noted above.  Physical Exam    VS:  BP (!) 96/54 (BP Location: Left Arm, Patient Position: Sitting, Cuff Size: Normal)   Pulse 62   Ht  (1.499 m)   Wt 103 lb 8 oz (46.9 kg)   BMI 20.90 kg/m  , BMI Body mass index is 20.9 kg/m. GEN: Frail, in no acute distress.  HEENT: normal.  Neck: Supple, no JVD, carotid bruits, or masses. Cardiac: RRR,2/6 systolic murmur heard throughout, no rubs, or gallops. No clubbing, cyanosis, edema.  Radials/DP/PT 2+ and equal bilaterally.  Respiratory:  Respirations regular and unlabored, clear to auscultation bilaterally. GI: Soft, nontender, nondistended, BS + x 4. MS: no deformity or atrophy. Skin:  warm and dry, no rash. Neuro:  Strength and sensation are intact. Psych: Normal affect.  Accessory Clinical Findings    ECG - Regular sinus rhythm, 62, leftward axis, septal infarct with anterolateral T-wave inversion-this is similar to hospital ECGs.  Assessment & Plan    1.  Non-STEMI, subsequent episode of care/coronary artery disease: Patient with prior history of stenting to the RCA, RPA, and ramus intermedius. She was recently admitted with chest pain and ruled in for non-STEMI. Given age, frailty, and comorbidities, decision was made to pursue a conservative approach. Echocardiogram did show new LV dysfunction with an EF of 20-25% and suggestion of an LAD infarct. She has not had any chest pain or significant dyspnea since discharge. She has been working with physical therapy and continues to recover from hip surgery in July. She remains on aspirin, statin, beta blocker, ACE inhibitor, and nitrate therapy. She has had some lightheadedness with standing and blood pressure is 96/54 today. I'm going to reduce her back to her previous dose of carvedilol, which was 3.125 mg twice a day. She will not be participating in cardiac rehabilitation but continues to receive physical therapy at home.  2. Essential hypertension: Blood pressure is stable and if anything soft. Reducing carvedilol back to 3.125 mg twice a day. Continue ACE inhibitor and nitrate.  3. Hyperlipidemia: LDL was 54 in July. She is on low-dose Crestor therapy.  4. Paroxysmal atrial fibrillation: This was noted during recent hospitalization. She is now on amiodarone 200 mg daily. CHA2DS2VASc equals 5. She is not on oral anticoagulation the setting of frailty and multiple falls. Continue aspirin. Plan to follow LFTs and TFTs at follow-up visit in approximately 2 months.  5. HFrEF/ischemic myopathy: This is a new diagnosis for her. Weight has been stable. She is euvolemic on exam. Continue beta blocker and ACE inhibitor as outlined  above. No blood pressure room to add spironolactone or consider transition to [facility].  6. Disposition: Follow-up in clinic in 2 months or sooner if necessary. Planned follow-up LFTs and TFTs at that time.   Nicolasa Ducking, NP 09/04/2017, 10:40 AM

## 2017-09-04 NOTE — Telephone Encounter (Signed)
° °  Caller Name:Wes  Relationship to Patient:[facility]   Best number:[phone] Pharmacy:  Reason for call:  Request for physical therapy  2 times week for 4 weeks

## 2017-09-05 ENCOUNTER — Encounter: Payer: Self-pay | Admitting: Internal Medicine

## 2017-09-05 ENCOUNTER — Other Ambulatory Visit: Payer: Self-pay | Admitting: Pharmacist

## 2017-09-05 ENCOUNTER — Ambulatory Visit: Payer: Medicare Other | Admitting: Internal Medicine

## 2017-09-05 VITALS — BP 132/62 | HR 66 | Resp 20

## 2017-09-05 DIAGNOSIS — I25119 Atherosclerotic heart disease of native coronary artery with unspecified angina pectoris: Secondary | ICD-10-CM

## 2017-09-05 DIAGNOSIS — I5022 Chronic systolic (congestive) heart failure: Secondary | ICD-10-CM | POA: Diagnosis not present

## 2017-09-05 DIAGNOSIS — I480 Paroxysmal atrial fibrillation: Secondary | ICD-10-CM | POA: Diagnosis not present

## 2017-09-05 DIAGNOSIS — Z23 Encounter for immunization: Secondary | ICD-10-CM

## 2017-09-05 DIAGNOSIS — F39 Unspecified mood [affective] disorder: Secondary | ICD-10-CM

## 2017-09-05 MED ORDER — MORPHINE SULFATE (CONCENTRATE) 10 MG /0.5 ML PO SOLN: 5.0000 mg | ORAL | 0 refills | Status: DC | PRN

## 2017-09-05 NOTE — Assessment & Plan Note (Signed)
Stopped both meds that were started in rehab Mood has been okay--considering she had to go to the hospital again Will hold off on restarting either of them May want to consider restarting the mirtazapine if she loses more weight

## 2017-09-05 NOTE — Patient Outreach (Signed)
[facility]) Care Management  09/05/2017  Christina [name] 02/10/1920 [id number]   83 year old female referred to [facility] Care Management for EMMI alert after discharge.  [facility] Pharmacy services requested for medication reconciliation.  PMHx includes, but not limited to, CAD, bilateral carotid stenosis, PAD, congestive heart failure (EF 20-25% 9/'18), HTN, HLD, and PAF not on anticoagulation due to history of falls and age.  Patient had left hip fracture 2/2 fall 7/'18 and was hospitalized 9/7-9/10 for NSTEMI treated with medical management.    Successful telephone encounter with Christina [name]'s daughter, Christina [name].  Christina [name] is not able to discuss patient's medications at this time and requests that I return her call tomorrow morning.   Plan: Call Christina [name] tomorrow morning, September 20th, to review Christina [name]'s medications.   Haynes Hoehn, PharmD, [facility] Clinical Pharmacist [facility] [employer] [phone]

## 2017-09-05 NOTE — Progress Notes (Signed)
Subjective:    Patient ID: Christina [name], female    DOB: 22-Jun-1920, 83 y.o.   MRN: [id number]  HPI Home visit for hospital follow up Reviewed hospital records Reviewed cardiology follow up visit as well  She got chest pain, vomiting and was clammy Granddaughter was here--called [facility], was told to call doctor She didn't try the nitro --even though it was right there  Had some nausea the night before--and a "lump" in her epigastric area Then awoke the next day with worse symptoms  Did have paroxysm of atrial fibrillation in hospital Clear NSTEMI by enzymes Now on amiodarone  Carvedilol reduced back to prior dose (had been increased in hospital)  Has slight pain in left chest-- but "feels alright"--since coming home No SOB  Will be starting again with PT ---on hold due to the MI  Ran out of both mirtazapine and venlafaxine about a week ago No obvious change in mood  Current Outpatient Prescriptions on File Prior to Visit  Medication Sig Dispense Refill  . acetaminophen (TYLENOL 8 HOUR) 650 MG CR tablet Take 650 mg by mouth every 8 (eight) hours as needed for pain. Do not exceed 3000 mg / 24 hrs of acetaminophen. Consider all sources    . amiodarone (PACERONE) 200 MG tablet Take 200 mg by mouth daily.    Marland Kitchen aspirin EC 81 MG tablet Take 81 mg by mouth daily.     . carvedilol (COREG) 3.125 MG tablet Take 1 tablet (3.125 mg total) by mouth 2 (two) times daily with a meal. 90 tablet 3  . Cholecalciferol (VITAMIN D3) 2000 units capsule Take 2,000 Units by mouth daily.    Marland Kitchen ibandronate (BONIVA) 150 MG tablet Take 150 mg by mouth every 30 (thirty) days. Take in the morning with a full glass of water, on an empty stomach, and do not take anything else by mouth or lie down for the next 30 min.    . isosorbide mononitrate (IMDUR) 30 MG 24 hr tablet TAKE 1 TABLET BY MOUTH  DAILY 90 tablet 3  . Lidocaine (ASPERCREME LIDOCAINE) 4 % PTCH Apply 1 patch topically daily. Apply to lower  back/sacrum for pain. Remove after 12 hours.    Marland Kitchen lisinopril (PRINIVIL,ZESTRIL) 10 MG tablet TAKE 1 TABLET BY MOUTH  DAILY 90 tablet 3  . MEGARED OMEGA-3 KRILL OIL 500 MG CAPS Take 1 capsule by mouth daily.     . mirtazapine (REMERON) 7.5 MG tablet Take 7.5 mg by mouth at bedtime.     . Multiple Vitamins-Minerals (MULTIVITAL) tablet Take 1 tablet by mouth daily.    . nitroGLYCERIN (NITROSTAT) 0.4 MG SL tablet Place 1 tablet (0.4 mg total) under the tongue every 5 (five) minutes as needed for chest pain. 25 tablet 2  . rosuvastatin (CRESTOR) 5 MG tablet Take 5 mg by mouth daily.    . traMADol (ULTRAM) 50 MG tablet Take by mouth every 6 (six) hours as needed.    . venlafaxine XR (EFFEXOR-XR) 37.5 MG 24 hr capsule Take 37.5 mg by mouth daily with breakfast.     No current facility-administered medications on file prior to visit.     Allergies  Allergen Reactions  . Bee Venom Rash  . Plavix [Clopidogrel] Rash  . Atorvastatin Other (See Comments)    Reaction:  Blisters on feet   . Diltiazem Hcl Other (See Comments)    Reaction:  Unknown   . Dye Fdc Red [Red Dye] Other (See Comments)  Reaction:  Unknown   . Gemfibrozil Other (See Comments)    Reaction:  Unknown   . Niacin Other (See Comments)    Reaction:  Unknown   . Sulfasalazine Rash    Past Medical History:  Diagnosis Date  . CAD (coronary artery disease)    a. S/P previous MI;  b. 04/2007 Cath/PCI: Taxus DES' to mid/distal RCA and RPDA as well as Ramus;  c. myoview 8/09: EF 78%, normal perfusion; d. 12/2012 Low risk MV; e. 08/2017 NSTEMI->Med Rx.  . Carotid stenosis    Bilateral, mild to moderate  . Chronic back pain   . Chronic shoulder pain   . HFrEF (heart failure with reduced ejection fraction) (HCC)    a. 08/2017 Echo: EF 20-25%, mid-apicalanteroseptal, ant, antlat, and apical sev HK. Mild MR, mildly dil LA, nl RV fxn.  . Hyperlipidemia    Intolerant of many statins  . Hypertension   . Ischemic cardiomyopathy    a.  08/2017 Echo: EF 20-25%, mid-apicalanteroseptal, ant, antlat, and apical sev HK.  . Mild mitral and aortic regurgitation    a. 11/2011 Echo: EF 55-65%, No RWMA, Gr 1 DD, Mild AI/MR; b. 08/2017 Echo: Mild MR.  Marland Kitchen Neuropathy   . Osteoarthritis   . Osteoporosis   . PAF (paroxysmal atrial fibrillation) (HCC)    a. 08/2017 - noted @ time of NSTEMI. CHA2DS2VASc = 5-->ASA only due to age and h/o falls;  c. On Amio.  Marland Kitchen Pelvic fracture (HCC) 6/16  . Peripheral vascular disease [facility])     Past Surgical History:  Procedure Laterality Date  . ADENOSINE MYOVIEW  01/2006   No ischemia, EF 70%  . BREAST BIOPSY     Bilateral  . CAROTID U/S  10/2006   0-39% bilaterally  . CATARACT EXTRACTION  09/2006   Right  . CORONARY ANGIOPLASTY  12/2002   Medical rx only  . CORONARY ANGIOPLASTY  01/2006   LAD/PAD disease  . CORONARY ANGIOPLASTY  03/2007   3 vessel disease  . CORONARY STENT PLACEMENT  2002   MI, [facility]  . CORONARY STENT PLACEMENT  04/2007   RCA/LCX multiple, [facility]  . CP ADMIT  09/2005   Stress myoview negative  . FEMORAL HERNIA REPAIR  05/2007   Incarcerated, right  . FRACTURE SURGERY  03/2009   Left leg  . INTRAMEDULLARY (IM) NAIL INTERTROCHANTERIC Right 05/18/2016   Procedure: INTRAMEDULLARY (IM) NAIL INTERTROCHANTRIC;  Surgeon: [name], MD;  Location: [facility] ORS;  Service: Orthopedics;  Laterality: Right;  . INTRAMEDULLARY (IM) NAIL INTERTROCHANTERIC Left 06/18/2017   Procedure: INTRAMEDULLARY (IM) NAIL INTERTROCHANTRIC;  Surgeon: Deeann Saint, MD;  Location: [facility] ORS;  Service: Orthopedics;  Laterality: Left;  . NM MYOVIEW LTD  11/2004   Stress, negative; NL EF  . VESICOVAGINAL FISTULA CLOSURE W/ TAH      Family History  Problem Relation Age of Onset  . Pulmonary embolism Mother   . Heart disease Father   . Heart disease Sister   . Cancer Brother   . Heart disease Sister     Social History   Social History  . Marital status: Widowed    Spouse name: N/A  . Number of  children: 3  . Years of education: 12   Occupational History  .      retired from [employer]   Social History Main Topics  . Smoking status: Never Smoker  . Smokeless tobacco: Never Used  . Alcohol use No  . Drug use: No  . Sexual activity:  Not on file   Other Topics Concern  . Not on file   Social History Narrative   Widowed 2005 after long time care for husband after stroke   Remarried but seperated   Has 3 children      No formal living will   Daughter [name] should be her health care POA   Has DNR already   No feeding tube if cognitively unaware   Review of Systems Not sleeping well at night--much due to pain. Hasn't been using the tramadol and only occasionally taking tylenol Appetite is improving some Only been once to see her husband (who is home from rehab)---but speaks to him on the phone    Objective:   Physical Exam  Constitutional: No distress.  Neck: No thyromegaly present.  Cardiovascular: Normal rate, regular rhythm and normal heart sounds.  Exam reveals no gallop.   No murmur heard. Pulmonary/Chest: Effort normal and breath sounds normal. No respiratory distress. She has no wheezes. She has no rales.  Abdominal: Soft. There is no tenderness.  Musculoskeletal: She exhibits no edema.  Lymphadenopathy:    She has no cervical adenopathy.          Assessment & Plan:

## 2017-09-05 NOTE — Assessment & Plan Note (Signed)
Not exacerbated now Will continue current Rx

## 2017-09-05 NOTE — Assessment & Plan Note (Signed)
Recent non STEMI  No cath due to advanced age, etc Still has some brief symptoms Discussed using nitro and Rx for roxanol---as palliative intervention to hopefully prevent another hospitalization (since aggressive means are not being considered)

## 2017-09-05 NOTE — Assessment & Plan Note (Signed)
In hospital  Regular again now On amiodarone ASA only due to high risk for bleeding

## 2017-09-06 ENCOUNTER — Other Ambulatory Visit: Payer: Self-pay | Admitting: *Deleted

## 2017-09-06 ENCOUNTER — Other Ambulatory Visit: Payer: Self-pay | Admitting: Pharmacist

## 2017-09-06 ENCOUNTER — Ambulatory Visit: Payer: Self-pay | Admitting: Pharmacist

## 2017-09-06 NOTE — Patient Outreach (Signed)
Successful telephone encounter for Consepcion Utt, 83 year old female, follow up on referral received 09/03/17  from [place] RN [facility] telephonic CM coordinator for Community CM services/transition of care/recent hospitalization September 7-10,2018 for Atypical chest pain, NSTEMI.  Spoke with daughter [name], reports pt is resting, HIPAA identifiers verified on pt, discussed purpose of call- follow up on referral for transition of care.  Daughter reports she has been staying with pt since his 07/30/17 hospitalization, manages pt's medications/does a pill planner,pt taking all medications as ordered.    Daughter reports pt saw Heart MD this week, decreased one of her heart medication and saw PCP yesterday/did a home visit.   Daughter reports pt is followed by [facility] (RN,PT), RN was suppose to come today but cancelled visit due to pt seen by two MD this week.   Daughter reports pt had a fall last night, no injury, have to remind pt not to let go of walker.   RN CM discussed with daughter [facility] transition of care program - weekly calls, a home visit, no cost to pt.    Plan:  As discussed with daughter, plan to follow up again next week telephonically, the following week do a home visit with pt.    Shayne Alken.   Amron Guerrette RN CCM [facility] Care Management  [phone]

## 2017-09-06 NOTE — Addendum Note (Signed)
Addended by: Eual Fines on: 09/06/2017 01:16 PM   Modules accepted: Orders

## 2017-09-06 NOTE — Patient Outreach (Signed)
[facility]) Care Management  09/06/2017  KYNNEDY CARRENO 08-01-1920 [id number]   Referral received from [facility] Juanita Futrell to assist patient and her caregivers with community resource information for in home assistance. Phone call to patient, spoke with daughter [name] who agrees with co-visit with RNCM and this social worker on 09/17/17.   Adriana Reams [facility] Care Management [phone]

## 2017-09-06 NOTE — Patient Outreach (Signed)
[facility]) Care Management  09/06/2017  Christina [name] [birth year] [id number]    83 year old female referred to [facility] Care Management for EMMI alert after discharge.  [facility] Pharmacy services requested for medication reconciliation.  PMHx includes, but not limited to, CAD, bilateral carotid stenosis, PAD, congestive heart failure (EF 20-25% 9/'18), HTN, HLD, and PAF not on anticoagulation due to history of falls and age.  Patient had left hip fracture 2/2 fall 7/'18 and was hospitalized 9/7-9/10 for NSTEMI treated with medical management.    Successful outreach call to Christina [name]'s daughter, Christina [name].  HIPAA identifiers verified. Christina [name] reports that a physician visited her mother at home yesterday and reviewed all medications with her mother and herself.  She feels she does not need a medication review with me and denies any medication questions or concerns.  I gave Christina [name] my phone number and am happy to assist if any issues arise in the future.   Plan: [facility] pharmacy will close patient case at this time.  I will alert [facility] RN and LCSW.

## 2017-09-07 DIAGNOSIS — S72142D Displaced intertrochanteric fracture of left femur, subsequent encounter for closed fracture with routine healing: Secondary | ICD-10-CM | POA: Diagnosis not present

## 2017-09-07 DIAGNOSIS — I2510 Atherosclerotic heart disease of native coronary artery without angina pectoris: Secondary | ICD-10-CM | POA: Diagnosis not present

## 2017-09-07 DIAGNOSIS — E785 Hyperlipidemia, unspecified: Secondary | ICD-10-CM | POA: Diagnosis not present

## 2017-09-07 DIAGNOSIS — I10 Essential (primary) hypertension: Secondary | ICD-10-CM | POA: Diagnosis not present

## 2017-09-07 DIAGNOSIS — M549 Dorsalgia, unspecified: Secondary | ICD-10-CM | POA: Diagnosis not present

## 2017-09-10 DIAGNOSIS — I2510 Atherosclerotic heart disease of native coronary artery without angina pectoris: Secondary | ICD-10-CM | POA: Diagnosis not present

## 2017-09-10 DIAGNOSIS — I10 Essential (primary) hypertension: Secondary | ICD-10-CM | POA: Diagnosis not present

## 2017-09-10 DIAGNOSIS — E785 Hyperlipidemia, unspecified: Secondary | ICD-10-CM | POA: Diagnosis not present

## 2017-09-10 DIAGNOSIS — S72142D Displaced intertrochanteric fracture of left femur, subsequent encounter for closed fracture with routine healing: Secondary | ICD-10-CM | POA: Diagnosis not present

## 2017-09-10 DIAGNOSIS — M549 Dorsalgia, unspecified: Secondary | ICD-10-CM | POA: Diagnosis not present

## 2017-09-12 DIAGNOSIS — E785 Hyperlipidemia, unspecified: Secondary | ICD-10-CM | POA: Diagnosis not present

## 2017-09-12 DIAGNOSIS — M549 Dorsalgia, unspecified: Secondary | ICD-10-CM | POA: Diagnosis not present

## 2017-09-12 DIAGNOSIS — I2510 Atherosclerotic heart disease of native coronary artery without angina pectoris: Secondary | ICD-10-CM | POA: Diagnosis not present

## 2017-09-12 DIAGNOSIS — S72142D Displaced intertrochanteric fracture of left femur, subsequent encounter for closed fracture with routine healing: Secondary | ICD-10-CM | POA: Diagnosis not present

## 2017-09-12 DIAGNOSIS — I10 Essential (primary) hypertension: Secondary | ICD-10-CM | POA: Diagnosis not present

## 2017-09-13 ENCOUNTER — Other Ambulatory Visit: Payer: Self-pay | Admitting: *Deleted

## 2017-09-13 NOTE — Patient Outreach (Signed)
10 am- Received a call from daughter Christina [name] asking if RN CM's call  for today could be rescheduled, pt wanted to watch a program- in middle of conversation- phone disconnected.   10:02 am -Successful telephone encounter to daughter Christina [name] for Christina Dail [facility]), 83 year old female for transition of care/ongoing follow up on recent hospitalization September 7-10,2018 for atypical chest pain, NSTEMI.   Spoke with daughter Christina [name], HIPAA identifiers verified on pt.  Daughter reports pt  was doing much better last week, today a little shaky/using walker/no falls.  Daughter reports pt not eating well, having issues with  dentures/loose when lost weight/ tried liners.  RN CM discussed with daughter calling PCP if pt does not improve to which voiced understanding.   Daughter reports HH PT came yesterday, saw pt 3 times since hospitalization, doing good with that.  Daughter reports [facility] RN will be coming out tomorrow.   Daughter reports pt has no sob,chest pain or swelling.   Daughter reports pt taking all of her medications as ordered/contines to administer.   Plan:  As discussed with daughter Christina [name], plan to follow up again next week- initial home visit.Marland Kitchen   Shayne Alken.   Airam Runions RN CCM [facility] Care Management  [phone]

## 2017-09-14 DIAGNOSIS — S72142D Displaced intertrochanteric fracture of left femur, subsequent encounter for closed fracture with routine healing: Secondary | ICD-10-CM | POA: Diagnosis not present

## 2017-09-14 DIAGNOSIS — I2510 Atherosclerotic heart disease of native coronary artery without angina pectoris: Secondary | ICD-10-CM | POA: Diagnosis not present

## 2017-09-14 DIAGNOSIS — I10 Essential (primary) hypertension: Secondary | ICD-10-CM | POA: Diagnosis not present

## 2017-09-14 DIAGNOSIS — E785 Hyperlipidemia, unspecified: Secondary | ICD-10-CM | POA: Diagnosis not present

## 2017-09-14 DIAGNOSIS — M549 Dorsalgia, unspecified: Secondary | ICD-10-CM | POA: Diagnosis not present

## 2017-09-17 ENCOUNTER — Encounter: Payer: Self-pay | Admitting: *Deleted

## 2017-09-17 ENCOUNTER — Other Ambulatory Visit: Payer: Self-pay | Admitting: *Deleted

## 2017-09-17 NOTE — Patient Outreach (Addendum)
[facility]) Care Management  [facility] Social Work  09/17/2017  Christina [name] 25-Jan-1920 [id number]  Subjective:  Patient is a 83 year old female referred to this social worker to provide resources for in home assistance. Per patient's daughter, patient lives alon in her own home, however patient's daughter [name] is her primary caregiver and is with patient daily. Patient's son also has been assisting with care, however he resides in [place] and will be returning home soon. Patient's daughter states that she will remain patient's primary caregiver, however would like to be able to leave the home to take care of personal business and still feel that patient is being taken care of. Per patient's daughter, she has concerns that patient is depressed, however states that her antidepressants were discontinued. Patient's husband, due to medical issues moved in with his daughter now is in rehab. Per patient's daughter, her mood is often unpredictable  Patient used to be very active, however now usually stays in. Attending activities  at the [facility] have been refused. Out of home placement is not an option, per patient's daughter. Objective:   Encounter Medications:  Outpatient Encounter Prescriptions as of 09/17/2017  Medication Sig Note  . acetaminophen (TYLENOL 8 HOUR) 650 MG CR tablet Take 650 mg by mouth every 8 (eight) hours as needed for pain. Do not exceed 3000 mg / 24 hrs of acetaminophen. Consider all sources 09/17/2017: As needed.   Marland Kitchen amiodarone (PACERONE) 200 MG tablet Take 200 mg by mouth daily.   Marland Kitchen aspirin EC 81 MG tablet Take 81 mg by mouth daily.    . carvedilol (COREG) 3.125 MG tablet Take 1 tablet (3.125 mg total) by mouth 2 (two) times daily with a meal.   . Cholecalciferol (VITAMIN D3) 2000 units capsule Take 2,000 Units by mouth daily. 09/17/2017: Per daughter taking 1000 units daily   . ibandronate (BONIVA) 150 MG tablet Take 150 mg by mouth every 30 (thirty) days. Take  in the morning with a full glass of water, on an empty stomach, and do not take anything else by mouth or lie down for the next 30 min. 09/17/2017: Per daughter pt to start back taking.   . isosorbide mononitrate (IMDUR) 30 MG 24 hr tablet TAKE 1 TABLET BY MOUTH  DAILY   . Lidocaine (ASPERCREME LIDOCAINE) 4 % PTCH Apply 1 patch topically daily. Apply to lower back/sacrum for pain. Remove after 12 hours.   Marland Kitchen lisinopril (PRINIVIL,ZESTRIL) 10 MG tablet TAKE 1 TABLET BY MOUTH  DAILY   . MEGARED OMEGA-3 KRILL OIL 500 MG CAPS Take 1 capsule by mouth daily.  09/17/2017: Per daughter taking 350 mg daily   . Morphine Sulfate (MORPHINE CONCENTRATE) 10 mg / 0.5 ml concentrated solution Take 0.25-0.5 mLs (5-10 mg total) by mouth every hour as needed for severe pain or shortness of breath. (Patient not taking: Reported on 09/17/2017) 09/17/2017: Available if needed.   . Multiple Vitamins-Minerals (MULTIVITAL) tablet Take 1 tablet by mouth daily.   . nitroGLYCERIN (NITROSTAT) 0.4 MG SL tablet Place 1 tablet (0.4 mg total) under the tongue every 5 (five) minutes as needed for chest pain. (Patient not taking: Reported on 09/17/2017) 09/17/2017: Available if needed, informed daughter expired 9/18.   Marland Kitchen rosuvastatin (CRESTOR) 5 MG tablet Take 5 mg by mouth daily.   . sennosides-docusate sodium (SENOKOT-S) 8.6-50 MG tablet Take 1 tablet by mouth daily.   . traMADol (ULTRAM) 50 MG tablet Take by mouth every 6 (six) hours as needed.  09/17/2017: Available if needed.    No facility-administered encounter medications on file as of 09/17/2017.     Functional Status:  In your present state of health, do you have any difficulty performing the following activities: 09/17/2017 08/24/2017  Hearing? Malvin Johns  Vision? Y Y  Difficulty concentrating or making decisions? N N  Walking or climbing stairs? Y Y  Dressing or bathing? N N  Doing errands, shopping? Malvin Johns  Preparing Food and eating ? Y -  Using the Toilet? Y -  In the past six months,  have you accidently leaked urine? N -  Do you have problems with loss of bowel control? N -  Managing your Medications? Y -  Managing your Finances? Y -  Housekeeping or managing your Housekeeping? N -  Some recent data might be hidden    Fall/Depression Screening:  PHQ 2/9 Scores 09/17/2017 09/03/2017 11/13/2016 09/06/2015 09/04/2014  PHQ - 2 Score 4 - - - 0  PHQ- 9 Score 12 - - - -  Exception Documentation - Medical reason Medical reason Medical reason -    Assessment: Resource list provided to patient's daughter to include private duty agencies.Patient's daughter's opportunity for self care explored and emphasized.  Contact information for the the [employer] also provided to increase patient's socialization., Attending activities at the [facility] also discussed.    Patient was very friendly and engaging, Openly discussed the loss of her son and the difficulty in being separated from her spouse. Patient did state that a friend of hers often visits from church and she states that she looks forward to those visits "her visits make me happy"  Plan:  This social worker will follow up with patient and daughter within 1 month regarding referrals for in home assistance.   Adriana Reams [facility] Care Management [phone]

## 2017-09-17 NOTE — Patient Outreach (Addendum)
[facility]) Care Management   09/17/2017  Christina [name] 1920-01-02 [id number]  Christina [name] is an 83 y.o. female  Subjective:  Pt reports being weak, appetite down, lost 100 lbs  In the last 3 years, last weight  103 lbs.  Pt reports no sob or chest pain, has a cough- difficult at times to bring up secretions (clear).   Pt reports frustrated over daughter having to help her, was independent for so  Long.   Objective:   Vitals:   09/17/17 1331  BP: 108/60  Pulse: 74  Resp: 20  SpO2: 96%    ROS  Physical Exam  Constitutional: She is oriented to person, place, and time. She appears well-developed and well-nourished.  Cardiovascular: Normal rate and regular rhythm.   Respiratory: Effort normal.  Crackles noted - posterior right lower lobe upon expiration   GI: Soft. Bowel sounds are normal.  Musculoskeletal: She exhibits no edema.  Neurological: She is alert and oriented to person, place, and time.  Skin: Skin is warm and dry.  Psychiatric: She has a normal mood and affect. Her behavior is normal. Judgment and thought content normal.    Encounter Medications:   Outpatient Encounter Prescriptions as of 09/17/2017  Medication Sig  . acetaminophen (TYLENOL 8 HOUR) 650 MG CR tablet Take 650 mg by mouth every 8 (eight) hours as needed for pain. Do not exceed 3000 mg / 24 hrs of acetaminophen. Consider all sources  . amiodarone (PACERONE) 200 MG tablet Take 200 mg by mouth daily.  Marland Kitchen aspirin EC 81 MG tablet Take 81 mg by mouth daily.   . carvedilol (COREG) 3.125 MG tablet Take 1 tablet (3.125 mg total) by mouth 2 (two) times daily with a meal.  . Cholecalciferol (VITAMIN D3) 2000 units capsule Take 2,000 Units by mouth daily.  Marland Kitchen ibandronate (BONIVA) 150 MG tablet Take 150 mg by mouth every 30 (thirty) days. Take in the morning with a full glass of water, on an empty stomach, and do not take anything else by mouth or lie down for the next 30 min.  . isosorbide  mononitrate (IMDUR) 30 MG 24 hr tablet TAKE 1 TABLET BY MOUTH  DAILY  . Lidocaine (ASPERCREME LIDOCAINE) 4 % PTCH Apply 1 patch topically daily. Apply to lower back/sacrum for pain. Remove after 12 hours.  Marland Kitchen lisinopril (PRINIVIL,ZESTRIL) 10 MG tablet TAKE 1 TABLET BY MOUTH  DAILY  . MEGARED OMEGA-3 KRILL OIL 500 MG CAPS Take 1 capsule by mouth daily.   . Morphine Sulfate (MORPHINE CONCENTRATE) 10 mg / 0.5 ml concentrated solution Take 0.25-0.5 mLs (5-10 mg total) by mouth every hour as needed for severe pain or shortness of breath.  . Multiple Vitamins-Minerals (MULTIVITAL) tablet Take 1 tablet by mouth daily.  . nitroGLYCERIN (NITROSTAT) 0.4 MG SL tablet Place 1 tablet (0.4 mg total) under the tongue every 5 (five) minutes as needed for chest pain.  . rosuvastatin (CRESTOR) 5 MG tablet Take 5 mg by mouth daily.  . traMADol (ULTRAM) 50 MG tablet Take by mouth every 6 (six) hours as needed.   No facility-administered encounter medications on file as of 09/17/2017.     Functional Status:   In your present state of health, do you have any difficulty performing the following activities: 09/17/2017 08/24/2017  Hearing? Malvin Johns  Vision? Y Y  Difficulty concentrating or making decisions? N N  Walking or climbing stairs? Y Y  Dressing or bathing? N N  Doing errands, shopping?  Malvin Johns  Preparing Food and eating ? Y -  Using the Toilet? Y -  In the past six months, have you accidently leaked urine? N -  Do you have problems with loss of bowel control? N -  Managing your Medications? Y -  Managing your Finances? Y -  Housekeeping or managing your Housekeeping? N -  Some recent data might be hidden    Fall/Depression Screening:    Fall Risk  09/17/2017 09/03/2017 11/13/2016  Falls in the past year? Yes Yes Yes  Number falls in past yr: 2 or more 2 or more 1  Injury with Fall? Yes Yes Yes  Risk Factor Category  High Fall Risk High Fall Risk -  Risk for fall due to : Impaired balance/gait;Impaired mobility  History of fall(s);Impaired balance/gait;Medication side effect;Impaired mobility -  Follow up Falls prevention discussed Falls evaluation completed Education provided   [facility] 2/9 Scores 09/17/2017 09/03/2017 11/13/2016 09/06/2015 09/04/2014  PHQ - 2 Score 4 - - - 0  PHQ- 9 Score 12 - - - -  Exception Documentation - Medical reason Medical reason Medical reason -    Assessment:  Joint visit done with Chrystal LCSW.  Pleasant 83 year old female, resides in her home with daughter (present during home visit.  This RN CM following pt for transition of care/recent hospitalization September 7-10,2018 for  Atypical chest pain, NSTEMI.     CAD:  No complaints of sob, chest pain.  Lungs clear with exception of crackles auscultated on      Expiration posterior right lower lobe.  Pt coughing during home visit- per pt secretions clear.      Review of pt's medications, noted Nitroglycerin SL expired last month, as discussed daughter     To follow up at local pharmacy for refill.  Impaired nutrition: per pt appetite poor, issues with loose dentures/per daughter nothing more      Can be done.  Discussed increasing protein in diet (yogurt, cottage cheese,ongoing use of      Peanut butter).       Plan:  As discussed with pt and daughter, to continue to follow for transition of care, follow up again      Next week telephonically.           Plan to send Dr. Alphonsus Sias by in  [facility] letter informing of [facility] involvement as well as             09/17/17 home visit encounter.     THN CM Care Plan Problem One     Most Recent Value  Care Plan Problem One  Risk for readmission related to recent hospitalization for chest pain, NSTEMI   Role Documenting the Problem One  Care Management Coordinator  Care Plan for Problem One  Active  THN Long Term Goal   Pt would not readmit to the hospital within the next 31 days   THN Long Term Goal Start Date  09/06/17  Interventions for Problem One Long Term Goal  home visit  done- discussed importance of nutrition to improve weakness.   THN CM Short Term Goal #1   Pt would take all medications as ordered for the next 30 days   THN CM Short Term Goal #1 Start Date  09/06/17  Interventions for Short Term Goal #1  Medication review completed with daughter- noted Nitrioglycerin SL expired last month- daughter to f/u with pharmacy for refill.    THN CM Short Term Goal #2  Pt would follow up with all MD appointments in the next 30 days   THN CM Short Term Goal #2 Start Date  09/06/17  Interventions for Short Term Goal #2  Discussed with pt and daughter recent PCP home visit      Shayne Alken.   Pierzchala RN CCM [facility] Care Management  [phone]

## 2017-09-18 ENCOUNTER — Encounter: Payer: Self-pay | Admitting: *Deleted

## 2017-09-18 DIAGNOSIS — M549 Dorsalgia, unspecified: Secondary | ICD-10-CM | POA: Diagnosis not present

## 2017-09-18 DIAGNOSIS — S72142D Displaced intertrochanteric fracture of left femur, subsequent encounter for closed fracture with routine healing: Secondary | ICD-10-CM | POA: Diagnosis not present

## 2017-09-18 DIAGNOSIS — I2510 Atherosclerotic heart disease of native coronary artery without angina pectoris: Secondary | ICD-10-CM | POA: Diagnosis not present

## 2017-09-18 DIAGNOSIS — E785 Hyperlipidemia, unspecified: Secondary | ICD-10-CM | POA: Diagnosis not present

## 2017-09-18 DIAGNOSIS — I10 Essential (primary) hypertension: Secondary | ICD-10-CM | POA: Diagnosis not present

## 2017-09-19 ENCOUNTER — Other Ambulatory Visit: Payer: Self-pay | Admitting: Internal Medicine

## 2017-09-19 DIAGNOSIS — S72142D Displaced intertrochanteric fracture of left femur, subsequent encounter for closed fracture with routine healing: Secondary | ICD-10-CM | POA: Diagnosis not present

## 2017-09-19 DIAGNOSIS — I2510 Atherosclerotic heart disease of native coronary artery without angina pectoris: Secondary | ICD-10-CM | POA: Diagnosis not present

## 2017-09-19 DIAGNOSIS — E785 Hyperlipidemia, unspecified: Secondary | ICD-10-CM | POA: Diagnosis not present

## 2017-09-19 DIAGNOSIS — I10 Essential (primary) hypertension: Secondary | ICD-10-CM | POA: Diagnosis not present

## 2017-09-19 DIAGNOSIS — M549 Dorsalgia, unspecified: Secondary | ICD-10-CM | POA: Diagnosis not present

## 2017-09-20 DIAGNOSIS — I10 Essential (primary) hypertension: Secondary | ICD-10-CM | POA: Diagnosis not present

## 2017-09-20 DIAGNOSIS — S72142D Displaced intertrochanteric fracture of left femur, subsequent encounter for closed fracture with routine healing: Secondary | ICD-10-CM | POA: Diagnosis not present

## 2017-09-20 DIAGNOSIS — E785 Hyperlipidemia, unspecified: Secondary | ICD-10-CM | POA: Diagnosis not present

## 2017-09-20 DIAGNOSIS — I2510 Atherosclerotic heart disease of native coronary artery without angina pectoris: Secondary | ICD-10-CM | POA: Diagnosis not present

## 2017-09-20 DIAGNOSIS — M549 Dorsalgia, unspecified: Secondary | ICD-10-CM | POA: Diagnosis not present

## 2017-09-24 DIAGNOSIS — I2510 Atherosclerotic heart disease of native coronary artery without angina pectoris: Secondary | ICD-10-CM | POA: Diagnosis not present

## 2017-09-24 DIAGNOSIS — I10 Essential (primary) hypertension: Secondary | ICD-10-CM | POA: Diagnosis not present

## 2017-09-24 DIAGNOSIS — M549 Dorsalgia, unspecified: Secondary | ICD-10-CM | POA: Diagnosis not present

## 2017-09-24 DIAGNOSIS — S72142D Displaced intertrochanteric fracture of left femur, subsequent encounter for closed fracture with routine healing: Secondary | ICD-10-CM | POA: Diagnosis not present

## 2017-09-24 DIAGNOSIS — E785 Hyperlipidemia, unspecified: Secondary | ICD-10-CM | POA: Diagnosis not present

## 2017-09-25 ENCOUNTER — Other Ambulatory Visit: Payer: Self-pay | Admitting: *Deleted

## 2017-09-25 NOTE — Patient Outreach (Signed)
Successful telephone encounter to Christina [name] (pt's daughter, on consent form) for Christina [name], 83 year old female.   This RN CM following pt for transition of care/recent hospitalization September 7-10,2018 for Atypical chest pain, NSTEMI.   Spoke with daughter Christina [name], HIPAA identifiers provided on pt.   Daughter reports she is currently at [facility], will be with pt tomorrow, requested a call back then.   Daughter reports pt is doing good.    Plan:  As requested, RN CM to call daughter tomorrow (part of pt's ongoing transition of care).    Shayne Alken.   Pierzchala RN CCM [facility] Care Management  [phone]

## 2017-09-26 ENCOUNTER — Other Ambulatory Visit: Payer: Self-pay | Admitting: *Deleted

## 2017-09-26 NOTE — Patient Outreach (Signed)
Successful telephone encounter to daughter Jean(on consent form, resides with pt/ pt [facility]) for Christina [name], 83 year old female for transition of care/recent hospitalization September 7-10,2018 for Atypical chest pain, NSTEMI.   Spoke with daughter, HIPAA identifiers provided on pt.  Daughter reports with pt close by that pt is doing better with eating, increasing protein, doing what MD instructed on snacking.   Daughter reports pt still has cough, as relayed by pt secretions clear, MD aware, thinks it is sinus, instructed to stay hydrated which she is.   Daughter reports as relayed by pt having chest pain last night at rest, took one Nitro SL with relief, chest pain did not last long.  Reinforced with daughter MD orders for pt for  chest pain- take Nitro SL then Morphine to which voiced understanding.   Daughter reports pt is taking all medications as ordered, did have Nitroglycerin SL refilled.     Plan:  As discussed with daughter, to have coworker Systems analyst CM covering for this RN CM follow up again next week telephonically (part of ongoing transition of care) and this RN CM follow up again the following week.     Shayne Alken.   Turner Baillie RN CCM [facility] Care Management  [phone]

## 2017-09-28 DIAGNOSIS — I2510 Atherosclerotic heart disease of native coronary artery without angina pectoris: Secondary | ICD-10-CM | POA: Diagnosis not present

## 2017-09-28 DIAGNOSIS — M549 Dorsalgia, unspecified: Secondary | ICD-10-CM | POA: Diagnosis not present

## 2017-09-28 DIAGNOSIS — I10 Essential (primary) hypertension: Secondary | ICD-10-CM | POA: Diagnosis not present

## 2017-09-28 DIAGNOSIS — E785 Hyperlipidemia, unspecified: Secondary | ICD-10-CM | POA: Diagnosis not present

## 2017-09-28 DIAGNOSIS — S72142D Displaced intertrochanteric fracture of left femur, subsequent encounter for closed fracture with routine healing: Secondary | ICD-10-CM | POA: Diagnosis not present

## 2017-10-01 ENCOUNTER — Telehealth: Payer: Self-pay | Admitting: Internal Medicine

## 2017-10-01 NOTE — Telephone Encounter (Signed)
That is fine 

## 2017-10-01 NOTE — Telephone Encounter (Signed)
Left verbal orders on VM for Wes.

## 2017-10-01 NOTE — Telephone Encounter (Signed)
Caller Name:Wes  [facility]hip to [facility]  Best number:[phone]  Pharmacy:  Reason for call: calling to get verbal orders for physical therapy  2 times a week for 4 weeks

## 2017-10-02 DIAGNOSIS — S72142D Displaced intertrochanteric fracture of left femur, subsequent encounter for closed fracture with routine healing: Secondary | ICD-10-CM | POA: Diagnosis not present

## 2017-10-02 DIAGNOSIS — I10 Essential (primary) hypertension: Secondary | ICD-10-CM | POA: Diagnosis not present

## 2017-10-02 DIAGNOSIS — I252 Old myocardial infarction: Secondary | ICD-10-CM | POA: Diagnosis not present

## 2017-10-02 DIAGNOSIS — I739 Peripheral vascular disease, unspecified: Secondary | ICD-10-CM | POA: Diagnosis not present

## 2017-10-02 DIAGNOSIS — E785 Hyperlipidemia, unspecified: Secondary | ICD-10-CM | POA: Diagnosis not present

## 2017-10-02 DIAGNOSIS — I2510 Atherosclerotic heart disease of native coronary artery without angina pectoris: Secondary | ICD-10-CM | POA: Diagnosis not present

## 2017-10-02 DIAGNOSIS — M1991 Primary osteoarthritis, unspecified site: Secondary | ICD-10-CM | POA: Diagnosis not present

## 2017-10-02 DIAGNOSIS — I6523 Occlusion and stenosis of bilateral carotid arteries: Secondary | ICD-10-CM | POA: Diagnosis not present

## 2017-10-02 DIAGNOSIS — M549 Dorsalgia, unspecified: Secondary | ICD-10-CM | POA: Diagnosis not present

## 2017-10-02 DIAGNOSIS — I480 Paroxysmal atrial fibrillation: Secondary | ICD-10-CM | POA: Diagnosis not present

## 2017-10-02 DIAGNOSIS — G629 Polyneuropathy, unspecified: Secondary | ICD-10-CM | POA: Diagnosis not present

## 2017-10-02 DIAGNOSIS — G8929 Other chronic pain: Secondary | ICD-10-CM | POA: Diagnosis not present

## 2017-10-02 DIAGNOSIS — M810 Age-related osteoporosis without current pathological fracture: Secondary | ICD-10-CM | POA: Diagnosis not present

## 2017-10-03 ENCOUNTER — Ambulatory Visit: Payer: Self-pay | Admitting: *Deleted

## 2017-10-03 ENCOUNTER — Encounter: Payer: Self-pay | Admitting: *Deleted

## 2017-10-03 ENCOUNTER — Other Ambulatory Visit: Payer: Self-pay | Admitting: *Deleted

## 2017-10-03 NOTE — Patient Outreach (Signed)
[facility] Care Management [facility] CM Telephone Outreach, Transition of Care day 31  10/03/2017  Christina RenshawRuth J [name] 1920-12-07 [id number]  Successful outgoing telephone outreach for to Christina [name], daughter/ caregiver, on [facility] written consent, for Christina BolsterRuth [name], 83 y/o female [facility] for transition of care after recent hospitalization September 7-10, 2018 for chest pain/ NSTEMI; patient was discharged home with home health [facility].  Patient has history including, but not limited to HTN/ HLD, CHF, CAD, A-fib, arthritis with osteoporosis, and mechanical falls.  HIPAA/ identity verified with patient's caregiver/ daughter during telephone call today.  I explained that I was contacting patient for [facility] CM Jodi Mourning(Rose Pierzchala), and caregiver reports understanding of same.  Today, Christina [name] reports that patient "is doing very well," and she denies that patient is in pain and confirms that patient has not experienced any recent chest pain.    Caregiver further reports:  -- caregiver will be going out for several hours later today to work; reports that she will be leaving patient at home, to gradually "increase her independence."  Caregiver reports that she continues staying with patient "most every day and every night." -- ongoing issues with cough persist, but "seem to be a little better;" reports that patient continues to expectorate "clear mucus," which at times causes patient to have choking sensation; daughter reports that she has encouraged patient to sit upright rather than reclining to help with this choking sensation, and I reinforced this.   -- home health services for PT remain in place; reports that PT visited patient at home yesterday and obtained extension of HH PT sessions for twice weekly x 4 additional weeks; discussed with caregiver benefits of patient's ongoing [facility], and caregiver states  she believes HH PT "has really helped." -- denies new or recent falls; states that patient continues using walker for ambulation around home "most of the time," but occasionally patient "forgets" and walks by holding on to furniture; caregiver reports she reminds patient not to this, and I reinforced this with caregiver. -- patient has all medications and takes using pill box prepared by caregiver; denies questions/ concerns around medications today  Patient's caregiver denies further issues, concerns, or problems today.  I provided her with my direct phone number should needs arise prior to primary [facility] return, and confirmed that she has the main [facility] CM office phone number, and the [facility] CM 24-hour nurse advice phone numbers, and shared that primary [facility] RN CM Rose would contact patient's caregiver again next week for ongoing transition of care; caregiver verbalized understanding and agreement with this plan.  Plan:  Will update patient's primary [facility] RN CM of today's successful telephone outreach to patient  Caryl PinaLaine Mckinney Piotr Christopher, RN, BSN, CCRN [facility] Care Coordinator [facility] Care Management  [phone]

## 2017-10-04 DIAGNOSIS — I6523 Occlusion and stenosis of bilateral carotid arteries: Secondary | ICD-10-CM | POA: Diagnosis not present

## 2017-10-04 DIAGNOSIS — M1991 Primary osteoarthritis, unspecified site: Secondary | ICD-10-CM | POA: Diagnosis not present

## 2017-10-04 DIAGNOSIS — I252 Old myocardial infarction: Secondary | ICD-10-CM | POA: Diagnosis not present

## 2017-10-04 DIAGNOSIS — M549 Dorsalgia, unspecified: Secondary | ICD-10-CM | POA: Diagnosis not present

## 2017-10-04 DIAGNOSIS — I2510 Atherosclerotic heart disease of native coronary artery without angina pectoris: Secondary | ICD-10-CM | POA: Diagnosis not present

## 2017-10-04 DIAGNOSIS — E785 Hyperlipidemia, unspecified: Secondary | ICD-10-CM | POA: Diagnosis not present

## 2017-10-04 DIAGNOSIS — I10 Essential (primary) hypertension: Secondary | ICD-10-CM | POA: Diagnosis not present

## 2017-10-04 DIAGNOSIS — G8929 Other chronic pain: Secondary | ICD-10-CM | POA: Diagnosis not present

## 2017-10-04 DIAGNOSIS — G629 Polyneuropathy, unspecified: Secondary | ICD-10-CM | POA: Diagnosis not present

## 2017-10-04 DIAGNOSIS — I739 Peripheral vascular disease, unspecified: Secondary | ICD-10-CM | POA: Diagnosis not present

## 2017-10-04 DIAGNOSIS — I480 Paroxysmal atrial fibrillation: Secondary | ICD-10-CM | POA: Diagnosis not present

## 2017-10-04 DIAGNOSIS — M810 Age-related osteoporosis without current pathological fracture: Secondary | ICD-10-CM | POA: Diagnosis not present

## 2017-10-04 DIAGNOSIS — S72142D Displaced intertrochanteric fracture of left femur, subsequent encounter for closed fracture with routine healing: Secondary | ICD-10-CM | POA: Diagnosis not present

## 2017-10-08 ENCOUNTER — Other Ambulatory Visit: Payer: Self-pay | Admitting: *Deleted

## 2017-10-08 DIAGNOSIS — I739 Peripheral vascular disease, unspecified: Secondary | ICD-10-CM | POA: Diagnosis not present

## 2017-10-08 DIAGNOSIS — E785 Hyperlipidemia, unspecified: Secondary | ICD-10-CM | POA: Diagnosis not present

## 2017-10-08 DIAGNOSIS — S72142D Displaced intertrochanteric fracture of left femur, subsequent encounter for closed fracture with routine healing: Secondary | ICD-10-CM | POA: Diagnosis not present

## 2017-10-08 DIAGNOSIS — I6523 Occlusion and stenosis of bilateral carotid arteries: Secondary | ICD-10-CM | POA: Diagnosis not present

## 2017-10-08 DIAGNOSIS — G629 Polyneuropathy, unspecified: Secondary | ICD-10-CM | POA: Diagnosis not present

## 2017-10-08 DIAGNOSIS — I2510 Atherosclerotic heart disease of native coronary artery without angina pectoris: Secondary | ICD-10-CM | POA: Diagnosis not present

## 2017-10-08 DIAGNOSIS — I480 Paroxysmal atrial fibrillation: Secondary | ICD-10-CM | POA: Diagnosis not present

## 2017-10-08 DIAGNOSIS — M549 Dorsalgia, unspecified: Secondary | ICD-10-CM | POA: Diagnosis not present

## 2017-10-08 DIAGNOSIS — M1991 Primary osteoarthritis, unspecified site: Secondary | ICD-10-CM | POA: Diagnosis not present

## 2017-10-08 DIAGNOSIS — I252 Old myocardial infarction: Secondary | ICD-10-CM | POA: Diagnosis not present

## 2017-10-08 DIAGNOSIS — M810 Age-related osteoporosis without current pathological fracture: Secondary | ICD-10-CM | POA: Diagnosis not present

## 2017-10-08 DIAGNOSIS — G8929 Other chronic pain: Secondary | ICD-10-CM | POA: Diagnosis not present

## 2017-10-08 DIAGNOSIS — I10 Essential (primary) hypertension: Secondary | ICD-10-CM | POA: Diagnosis not present

## 2017-10-08 NOTE — Patient Outreach (Signed)
Successful telephone encounter for Christina [name], 83 year old female for transition of care (final call),ongoing follow up on recent hospitalization September 87-10,2018 for Atypical chest pain,NSTEMI.  Spoke with daughter Christina [name] (on consent form,caregiver), HIPAA identifiers provided on pt.   Daughter reports pt is currently having a coughing spell, happens after she eats/subsided so daughter handed phone to pt.   Spoke with pt, HIPAA identifiers verified.  Pt reports on cough- happens every now and then,tickle deep down in throat, spit up a lot of phlegm/clear, staying hydrated.  Pt reports appetite is a lot better, including protein/crackers with peanut butter and cheese as suggested by PCP.  Pt reports no recent chest pain, no falls- using walker all the time/stays in the house all the time.  Pt reports taking all medications as ordered, daughter still doing pill planner but wants to start back doing them herself.  Pt reports she is going to have daughter write on top of medication  bottles the times to take them.   Pt reports she is starting to do things on her own - make her bed/attend to small plants/sweep.  Pt reports no recent MD visits but to see PCP and Heart MD soon.  RN CM discussed with pt today final transition of care call, to follow up again next month- home visit.    Plan:  As discussed with pt, plan to continue to follow with Community CM services- follow up again next month home visit.           Care plan updated.   Shayne Alkenose M.   Pierzchala RN CCM [facility] Care Management  [phone]

## 2017-10-10 DIAGNOSIS — M549 Dorsalgia, unspecified: Secondary | ICD-10-CM | POA: Diagnosis not present

## 2017-10-10 DIAGNOSIS — I10 Essential (primary) hypertension: Secondary | ICD-10-CM | POA: Diagnosis not present

## 2017-10-10 DIAGNOSIS — I2510 Atherosclerotic heart disease of native coronary artery without angina pectoris: Secondary | ICD-10-CM | POA: Diagnosis not present

## 2017-10-10 DIAGNOSIS — I6523 Occlusion and stenosis of bilateral carotid arteries: Secondary | ICD-10-CM | POA: Diagnosis not present

## 2017-10-10 DIAGNOSIS — G629 Polyneuropathy, unspecified: Secondary | ICD-10-CM | POA: Diagnosis not present

## 2017-10-10 DIAGNOSIS — I739 Peripheral vascular disease, unspecified: Secondary | ICD-10-CM | POA: Diagnosis not present

## 2017-10-10 DIAGNOSIS — M810 Age-related osteoporosis without current pathological fracture: Secondary | ICD-10-CM | POA: Diagnosis not present

## 2017-10-10 DIAGNOSIS — S72142D Displaced intertrochanteric fracture of left femur, subsequent encounter for closed fracture with routine healing: Secondary | ICD-10-CM | POA: Diagnosis not present

## 2017-10-10 DIAGNOSIS — I252 Old myocardial infarction: Secondary | ICD-10-CM | POA: Diagnosis not present

## 2017-10-10 DIAGNOSIS — I480 Paroxysmal atrial fibrillation: Secondary | ICD-10-CM | POA: Diagnosis not present

## 2017-10-10 DIAGNOSIS — E785 Hyperlipidemia, unspecified: Secondary | ICD-10-CM | POA: Diagnosis not present

## 2017-10-10 DIAGNOSIS — M1991 Primary osteoarthritis, unspecified site: Secondary | ICD-10-CM | POA: Diagnosis not present

## 2017-10-10 DIAGNOSIS — G8929 Other chronic pain: Secondary | ICD-10-CM | POA: Diagnosis not present

## 2017-10-15 DIAGNOSIS — M810 Age-related osteoporosis without current pathological fracture: Secondary | ICD-10-CM | POA: Diagnosis not present

## 2017-10-15 DIAGNOSIS — I739 Peripheral vascular disease, unspecified: Secondary | ICD-10-CM | POA: Diagnosis not present

## 2017-10-15 DIAGNOSIS — M1991 Primary osteoarthritis, unspecified site: Secondary | ICD-10-CM | POA: Diagnosis not present

## 2017-10-15 DIAGNOSIS — I480 Paroxysmal atrial fibrillation: Secondary | ICD-10-CM | POA: Diagnosis not present

## 2017-10-15 DIAGNOSIS — E785 Hyperlipidemia, unspecified: Secondary | ICD-10-CM | POA: Diagnosis not present

## 2017-10-15 DIAGNOSIS — S72142D Displaced intertrochanteric fracture of left femur, subsequent encounter for closed fracture with routine healing: Secondary | ICD-10-CM | POA: Diagnosis not present

## 2017-10-15 DIAGNOSIS — M549 Dorsalgia, unspecified: Secondary | ICD-10-CM | POA: Diagnosis not present

## 2017-10-15 DIAGNOSIS — I2510 Atherosclerotic heart disease of native coronary artery without angina pectoris: Secondary | ICD-10-CM | POA: Diagnosis not present

## 2017-10-15 DIAGNOSIS — G629 Polyneuropathy, unspecified: Secondary | ICD-10-CM | POA: Diagnosis not present

## 2017-10-15 DIAGNOSIS — I10 Essential (primary) hypertension: Secondary | ICD-10-CM | POA: Diagnosis not present

## 2017-10-15 DIAGNOSIS — I252 Old myocardial infarction: Secondary | ICD-10-CM | POA: Diagnosis not present

## 2017-10-15 DIAGNOSIS — I6523 Occlusion and stenosis of bilateral carotid arteries: Secondary | ICD-10-CM | POA: Diagnosis not present

## 2017-10-15 DIAGNOSIS — G8929 Other chronic pain: Secondary | ICD-10-CM | POA: Diagnosis not present

## 2017-10-17 DIAGNOSIS — G629 Polyneuropathy, unspecified: Secondary | ICD-10-CM | POA: Diagnosis not present

## 2017-10-17 DIAGNOSIS — I252 Old myocardial infarction: Secondary | ICD-10-CM | POA: Diagnosis not present

## 2017-10-17 DIAGNOSIS — I739 Peripheral vascular disease, unspecified: Secondary | ICD-10-CM | POA: Diagnosis not present

## 2017-10-17 DIAGNOSIS — M549 Dorsalgia, unspecified: Secondary | ICD-10-CM | POA: Diagnosis not present

## 2017-10-17 DIAGNOSIS — M1991 Primary osteoarthritis, unspecified site: Secondary | ICD-10-CM | POA: Diagnosis not present

## 2017-10-17 DIAGNOSIS — R42 Dizziness and giddiness: Secondary | ICD-10-CM | POA: Diagnosis not present

## 2017-10-17 DIAGNOSIS — S72142D Displaced intertrochanteric fracture of left femur, subsequent encounter for closed fracture with routine healing: Secondary | ICD-10-CM | POA: Diagnosis not present

## 2017-10-17 DIAGNOSIS — I2510 Atherosclerotic heart disease of native coronary artery without angina pectoris: Secondary | ICD-10-CM | POA: Diagnosis not present

## 2017-10-17 DIAGNOSIS — I480 Paroxysmal atrial fibrillation: Secondary | ICD-10-CM | POA: Diagnosis not present

## 2017-10-17 DIAGNOSIS — K5900 Constipation, unspecified: Secondary | ICD-10-CM | POA: Diagnosis not present

## 2017-10-17 DIAGNOSIS — E785 Hyperlipidemia, unspecified: Secondary | ICD-10-CM | POA: Diagnosis not present

## 2017-10-17 DIAGNOSIS — G8929 Other chronic pain: Secondary | ICD-10-CM | POA: Diagnosis not present

## 2017-10-17 DIAGNOSIS — I10 Essential (primary) hypertension: Secondary | ICD-10-CM | POA: Diagnosis not present

## 2017-10-17 DIAGNOSIS — Z7982 Long term (current) use of aspirin: Secondary | ICD-10-CM

## 2017-10-17 DIAGNOSIS — I6523 Occlusion and stenosis of bilateral carotid arteries: Secondary | ICD-10-CM | POA: Diagnosis not present

## 2017-10-17 DIAGNOSIS — Z9181 History of falling: Secondary | ICD-10-CM

## 2017-10-17 DIAGNOSIS — Z955 Presence of coronary angioplasty implant and graft: Secondary | ICD-10-CM

## 2017-10-17 DIAGNOSIS — M810 Age-related osteoporosis without current pathological fracture: Secondary | ICD-10-CM | POA: Diagnosis not present

## 2017-10-18 ENCOUNTER — Other Ambulatory Visit: Payer: Self-pay | Admitting: *Deleted

## 2017-10-18 ENCOUNTER — Ambulatory Visit: Payer: Self-pay | Admitting: *Deleted

## 2017-10-18 NOTE — Patient Outreach (Signed)
[facility]N) Care Management  10/18/2017  Christina RenshawRuth J [name] 09/17/1920 [id number]   Phone call to patient to follow up on community resources provided for in home support. Voicemail message left requesting a return call.      Adriana ReamsChrystal Teniyah Seivert, LCSW [facility] Care Management [phone]

## 2017-10-19 ENCOUNTER — Ambulatory Visit: Payer: Self-pay | Admitting: *Deleted

## 2017-10-19 ENCOUNTER — Other Ambulatory Visit: Payer: Self-pay | Admitting: *Deleted

## 2017-10-19 ENCOUNTER — Encounter: Payer: Self-pay | Admitting: *Deleted

## 2017-10-19 ENCOUNTER — Telehealth: Payer: Self-pay | Admitting: Cardiovascular Disease

## 2017-10-19 MED ORDER — AMIODARONE HCL 200 MG PO TABS: 200.0000 mg | ORAL_TABLET | Freq: Every day | ORAL | 3 refills | Status: DC

## 2017-10-19 NOTE — Patient Outreach (Signed)
[facility]N) Care Management  10/19/2017  Christina RenshawRuth J [name] May 09, 1920 [id number]   Phone call to patient and patient's daughter to follow up on information provided for in home support and to assess for any additional community support needs. Voicemail message left for a return call.    Return call from patient's daughter who states that she no longer needs in home assistance for patient as patient is doing much better and starting to do some things on her own. Per patient's daughter, she is able to leave patient alone for a few hours during the day. She also has church members that come by to check on her as well. Patient however, does not like to be alone at night and patient's daughter will generally stay with patient at night.   Plan: Patient's daughter verbalized having no additional community resource needs att his time and agrees to contact this Child psychotherapistsocial worker if additional needs arise.  Patient to be [facility] to [facility] at this time.     Christina ReamsChrystal Land, LCSW [facility] Care Management [phone]

## 2017-10-19 NOTE — Telephone Encounter (Signed)
°*  STAT* If patient is at the pharmacy, call can be transferred to refill team.   1. Which medications need to be refilled? (please list name of each medication and dose if known)   Amiodarone 200 mg po q d   2. Which pharmacy/location (including street and city if local pharmacy) is medication to be sent to?  [facility] [address] [place]    3. Do they need a 30 day or 90 day supply? 30

## 2017-10-19 NOTE — Telephone Encounter (Signed)
Refill sent for Amiodarone 200 mg one tablet daily.

## 2017-10-22 DIAGNOSIS — S72142D Displaced intertrochanteric fracture of left femur, subsequent encounter for closed fracture with routine healing: Secondary | ICD-10-CM | POA: Diagnosis not present

## 2017-10-22 DIAGNOSIS — I2510 Atherosclerotic heart disease of native coronary artery without angina pectoris: Secondary | ICD-10-CM | POA: Diagnosis not present

## 2017-10-22 DIAGNOSIS — M1991 Primary osteoarthritis, unspecified site: Secondary | ICD-10-CM | POA: Diagnosis not present

## 2017-10-22 DIAGNOSIS — G8929 Other chronic pain: Secondary | ICD-10-CM | POA: Diagnosis not present

## 2017-10-22 DIAGNOSIS — I10 Essential (primary) hypertension: Secondary | ICD-10-CM | POA: Diagnosis not present

## 2017-10-22 DIAGNOSIS — I480 Paroxysmal atrial fibrillation: Secondary | ICD-10-CM | POA: Diagnosis not present

## 2017-10-22 DIAGNOSIS — M810 Age-related osteoporosis without current pathological fracture: Secondary | ICD-10-CM | POA: Diagnosis not present

## 2017-10-22 DIAGNOSIS — I6523 Occlusion and stenosis of bilateral carotid arteries: Secondary | ICD-10-CM | POA: Diagnosis not present

## 2017-10-22 DIAGNOSIS — I252 Old myocardial infarction: Secondary | ICD-10-CM | POA: Diagnosis not present

## 2017-10-22 DIAGNOSIS — I739 Peripheral vascular disease, unspecified: Secondary | ICD-10-CM | POA: Diagnosis not present

## 2017-10-22 DIAGNOSIS — E785 Hyperlipidemia, unspecified: Secondary | ICD-10-CM | POA: Diagnosis not present

## 2017-10-22 DIAGNOSIS — G629 Polyneuropathy, unspecified: Secondary | ICD-10-CM | POA: Diagnosis not present

## 2017-10-22 DIAGNOSIS — M549 Dorsalgia, unspecified: Secondary | ICD-10-CM | POA: Diagnosis not present

## 2017-10-23 ENCOUNTER — Other Ambulatory Visit: Payer: Self-pay | Admitting: *Deleted

## 2017-10-23 NOTE — Patient Outreach (Signed)
[facility]) Care Management   10/23/2017  Christina [name] [birth year] [id number]  Christina [name] is an 83 y.o. female  Subjective:  Pt reports been dealing with recent death of spouse.  Pt reports appetite is better, Eating regular food,weight staying at 100 lbs.  Pt  Reports still has cough to which daughter  Noticed happens After eating. Pt  reports no recent chest pain, only used Nitro SL once since  discharge home recent hospitalization September 7-10, 2018.  Pt reports has had pain in back/ Left lower side several times/throbbing/lasts 5 minutes but was after an activity(sweeping).   Objective:   Vitals:   10/23/17 1034  BP: 122/64  Pulse: 64  Resp: (!) 24  SpO2: 98%    ROS  Physical Exam  Constitutional: She is oriented to person, place, and time. She appears well-developed and well-nourished.  Cardiovascular: Normal rate.  Respiratory: Effort normal and breath sounds normal.  GI: Soft.  Musculoskeletal: Normal range of motion. She exhibits no edema.  Neurological: She is alert and oriented to person, place, and time.  Skin: Skin is warm and dry.  Psychiatric: She has a normal mood and affect. Her behavior is normal. Judgment and thought content normal.    Encounter Medications:   Outpatient Encounter Medications as of 10/23/2017  Medication Sig Note  . acetaminophen (TYLENOL 8 HOUR) 650 MG CR tablet Take 650 mg by mouth every 8 (eight) hours as needed for pain. Do not exceed 3000 mg / 24 hrs of acetaminophen. Consider all sources 09/17/2017: As needed.   Marland Kitchen amiodarone (PACERONE) 200 MG tablet Take 1 tablet (200 mg total) by mouth daily.   Marland Kitchen aspirin EC 81 MG tablet Take 81 mg by mouth daily.    . carvedilol (COREG) 3.125 MG tablet Take 1 tablet (3.125 mg total) by mouth 2 (two) times daily with a meal.   . Cholecalciferol (VITAMIN D3) 2000 units capsule Take 2,000 Units by mouth daily. 09/17/2017: Per daughter taking 1000 units daily   . ibandronate (BONIVA)  150 MG tablet Take 150 mg by mouth every 30 (thirty) days. Take in the morning with a full glass of water, on an empty stomach, and do not take anything else by mouth or lie down for the next 30 min. 09/17/2017: Per daughter pt to start back taking.   . isosorbide mononitrate (IMDUR) 30 MG 24 hr tablet TAKE 1 TABLET BY MOUTH  DAILY   . Lidocaine (ASPERCREME LIDOCAINE) 4 % PTCH Apply 1 patch topically daily. Apply to lower back/sacrum for pain. Remove after 12 hours.   Marland Kitchen lisinopril (PRINIVIL,ZESTRIL) 10 MG tablet TAKE 1 TABLET BY MOUTH  DAILY   . MEGARED OMEGA-3 KRILL OIL 500 MG CAPS Take 1 capsule by mouth daily.  09/17/2017: Per daughter taking 350 mg daily   . Morphine Sulfate (MORPHINE CONCENTRATE) 10 mg / 0.5 ml concentrated solution Take 0.25-0.5 mLs (5-10 mg total) by mouth every hour as needed for severe pain or shortness of breath. (Patient not taking: Reported on 09/17/2017) 09/17/2017: Available if needed.   . Multiple Vitamins-Minerals (MULTIVITAL) tablet Take 1 tablet by mouth daily.   . nitroGLYCERIN (NITROSTAT) 0.4 MG SL tablet PLACE 1 TABLET (0.4 MG TOTAL) UNDER THE TONGUE EVERY 5 (FIVE) MINUTES AS NEEDED FOR CHEST PAIN.   . rosuvastatin (CRESTOR) 5 MG tablet Take 5 mg by mouth daily.   . sennosides-docusate sodium (SENOKOT-S) 8.6-50 MG tablet Take 1 tablet by mouth daily.   . traMADol (ULTRAM) 50  MG tablet Take by mouth every 6 (six) hours as needed. 09/17/2017: Available if needed.    No facility-administered encounter medications on file as of 10/23/2017.     Functional Status:   In your present state of health, do you have any difficulty performing the following activities: 09/17/2017 08/24/2017  Hearing? Christina [name]  Vision? Y Y  Difficulty concentrating or making decisions? N N  Walking or climbing stairs? Y Y  Dressing or bathing? N N  Doing errands, shopping? Christina [name]  Preparing Food and eating ? Y -  Using the Toilet? Y -  In the past six months, have you accidently leaked urine? N -  Do  you have problems with loss of bowel control? N -  Managing your Medications? Y -  Managing your Finances? Y -  Housekeeping or managing your Housekeeping? N -  Some recent data might be hidden    Fall/Depression Screening:    Fall Risk  10/03/2017 09/17/2017 09/03/2017  Falls in the past year? (No Data) Yes Yes  Comment No new falls, per daughter/ caregiver, Christina [name], on [facility] CM written consent - -  Number falls in past yr: - 2 or more 2 or more  Injury with Fall? - Yes Yes  Risk Factor Category  - High Fall Risk High Fall Risk  Risk for fall due to : - Impaired balance/gait;Impaired mobility History of fall(s);Impaired balance/gait;Medication side effect;Impaired mobility  Follow up - Falls prevention discussed Falls evaluation completed   PHQ 2/9 Scores 09/17/2017 09/03/2017 11/13/2016 09/06/2015 09/04/2014  PHQ - 2 Score 4 - - - 0  PHQ- 9 Score 12 - - - -  Exception Documentation - Medical reason Medical reason Medical reason -    Assessment:  Pleasant 83 year old female, lives alone/daughter visits daily,continues to do pill Planner.    CAD:  Lungs clear, heart sounds normal, no complaints of sob,chest pain, no edema.  Nutrition:  Per pt better, would like to gain more weight/goal of one pound for next month.   Cough:  Occasional cough noted during home visit, discussed with pt drinking before and after   eating- see if improved.   Plan:  Pt to follow up with Dr. Fletcher Anon 11/26, Dr. Silvio Pate 12/3.              As discussed with pt/daughter to continue to provide Community CM services, follow               Up again next month- home visit.   [facility] CM Care Plan Problem Two     Most Recent Value  Care Plan Problem Two  Hx of CAD- recent hospitalization   Role Documenting the Problem Two  Care Management Coordinator  Care Plan for Problem Two  Active  Interventions for Problem Two Long Term Goal   Teach back method used with pt on current status- chest pain, cough, pain - per pt no chest pain,  pain, cough better.   [facility] Long Term Goal  Pt would have no issues with CAD in the next 45 days   [facility] Long Term Goal Start Date  10/08/17  [facility] CM Short Term Goal #1   Pt would take all medications as ordered within the next 30 days   [facility] CM Short Term Goal #1 Start Date  10/08/17  [facility] CM Short Term Goal #1 Met Date   10/23/17  Interventions for Short Term Goal #2   Medications reviewed with daughter who continues to do pill  planner for pt, pt taking as ordered.   [facility] CM Short Term Goal #2   Re established- pt would see improvement in cough within the next 30 days   [facility] CM Short Term Goal #2 Start Date  10/23/17  Interventions for Short Term Goal #2  With pt reporting coughing occurs after eating, diiscussed with pt drinking before and after eating to see if improvement seen- pt voiced understanding.      Zara Chess.   [facility]  [phone]

## 2017-10-24 DIAGNOSIS — S72142D Displaced intertrochanteric fracture of left femur, subsequent encounter for closed fracture with routine healing: Secondary | ICD-10-CM | POA: Diagnosis not present

## 2017-10-24 DIAGNOSIS — I480 Paroxysmal atrial fibrillation: Secondary | ICD-10-CM | POA: Diagnosis not present

## 2017-10-24 DIAGNOSIS — E785 Hyperlipidemia, unspecified: Secondary | ICD-10-CM | POA: Diagnosis not present

## 2017-10-24 DIAGNOSIS — I739 Peripheral vascular disease, unspecified: Secondary | ICD-10-CM | POA: Diagnosis not present

## 2017-10-24 DIAGNOSIS — I10 Essential (primary) hypertension: Secondary | ICD-10-CM | POA: Diagnosis not present

## 2017-10-24 DIAGNOSIS — I252 Old myocardial infarction: Secondary | ICD-10-CM | POA: Diagnosis not present

## 2017-10-24 DIAGNOSIS — I2510 Atherosclerotic heart disease of native coronary artery without angina pectoris: Secondary | ICD-10-CM | POA: Diagnosis not present

## 2017-10-24 DIAGNOSIS — I6523 Occlusion and stenosis of bilateral carotid arteries: Secondary | ICD-10-CM | POA: Diagnosis not present

## 2017-10-24 DIAGNOSIS — G8929 Other chronic pain: Secondary | ICD-10-CM | POA: Diagnosis not present

## 2017-10-24 DIAGNOSIS — M549 Dorsalgia, unspecified: Secondary | ICD-10-CM | POA: Diagnosis not present

## 2017-10-24 DIAGNOSIS — G629 Polyneuropathy, unspecified: Secondary | ICD-10-CM | POA: Diagnosis not present

## 2017-10-24 DIAGNOSIS — M1991 Primary osteoarthritis, unspecified site: Secondary | ICD-10-CM | POA: Diagnosis not present

## 2017-10-24 DIAGNOSIS — M810 Age-related osteoporosis without current pathological fracture: Secondary | ICD-10-CM | POA: Diagnosis not present

## 2017-10-30 ENCOUNTER — Telehealth: Payer: Self-pay | Admitting: Internal Medicine

## 2017-10-30 DIAGNOSIS — G8929 Other chronic pain: Secondary | ICD-10-CM | POA: Diagnosis not present

## 2017-10-30 DIAGNOSIS — I480 Paroxysmal atrial fibrillation: Secondary | ICD-10-CM | POA: Diagnosis not present

## 2017-10-30 DIAGNOSIS — I252 Old myocardial infarction: Secondary | ICD-10-CM | POA: Diagnosis not present

## 2017-10-30 DIAGNOSIS — G629 Polyneuropathy, unspecified: Secondary | ICD-10-CM | POA: Diagnosis not present

## 2017-10-30 DIAGNOSIS — I739 Peripheral vascular disease, unspecified: Secondary | ICD-10-CM | POA: Diagnosis not present

## 2017-10-30 DIAGNOSIS — I10 Essential (primary) hypertension: Secondary | ICD-10-CM | POA: Diagnosis not present

## 2017-10-30 DIAGNOSIS — I6523 Occlusion and stenosis of bilateral carotid arteries: Secondary | ICD-10-CM | POA: Diagnosis not present

## 2017-10-30 DIAGNOSIS — M810 Age-related osteoporosis without current pathological fracture: Secondary | ICD-10-CM | POA: Diagnosis not present

## 2017-10-30 DIAGNOSIS — E785 Hyperlipidemia, unspecified: Secondary | ICD-10-CM | POA: Diagnosis not present

## 2017-10-30 DIAGNOSIS — I2510 Atherosclerotic heart disease of native coronary artery without angina pectoris: Secondary | ICD-10-CM | POA: Diagnosis not present

## 2017-10-30 DIAGNOSIS — S72142D Displaced intertrochanteric fracture of left femur, subsequent encounter for closed fracture with routine healing: Secondary | ICD-10-CM | POA: Diagnosis not present

## 2017-10-30 DIAGNOSIS — M1991 Primary osteoarthritis, unspecified site: Secondary | ICD-10-CM | POA: Diagnosis not present

## 2017-10-30 DIAGNOSIS — M549 Dorsalgia, unspecified: Secondary | ICD-10-CM | POA: Diagnosis not present

## 2017-10-30 NOTE — Telephone Encounter (Signed)
Copied from CRM [phone]. Topic: Quick Communication - See Telephone Encounter >> Oct 30, 2017  2:56 PM Diana EvesHoyt, Maryann B wrote: CRM for notification. See Telephone encounter for:  Gerri SporeWesley from [facility] would like a verbal order for PT extended for 2x a week for 3 weeks call back number [phone] 10/30/17.

## 2017-10-31 DIAGNOSIS — I480 Paroxysmal atrial fibrillation: Secondary | ICD-10-CM | POA: Diagnosis not present

## 2017-10-31 DIAGNOSIS — E785 Hyperlipidemia, unspecified: Secondary | ICD-10-CM | POA: Diagnosis not present

## 2017-10-31 DIAGNOSIS — G629 Polyneuropathy, unspecified: Secondary | ICD-10-CM | POA: Diagnosis not present

## 2017-10-31 DIAGNOSIS — I6523 Occlusion and stenosis of bilateral carotid arteries: Secondary | ICD-10-CM | POA: Diagnosis not present

## 2017-10-31 DIAGNOSIS — M810 Age-related osteoporosis without current pathological fracture: Secondary | ICD-10-CM | POA: Diagnosis not present

## 2017-10-31 DIAGNOSIS — M1991 Primary osteoarthritis, unspecified site: Secondary | ICD-10-CM | POA: Diagnosis not present

## 2017-10-31 DIAGNOSIS — S72142D Displaced intertrochanteric fracture of left femur, subsequent encounter for closed fracture with routine healing: Secondary | ICD-10-CM | POA: Diagnosis not present

## 2017-10-31 DIAGNOSIS — I10 Essential (primary) hypertension: Secondary | ICD-10-CM | POA: Diagnosis not present

## 2017-10-31 DIAGNOSIS — I252 Old myocardial infarction: Secondary | ICD-10-CM | POA: Diagnosis not present

## 2017-10-31 DIAGNOSIS — M549 Dorsalgia, unspecified: Secondary | ICD-10-CM | POA: Diagnosis not present

## 2017-10-31 DIAGNOSIS — I2510 Atherosclerotic heart disease of native coronary artery without angina pectoris: Secondary | ICD-10-CM | POA: Diagnosis not present

## 2017-10-31 DIAGNOSIS — G8929 Other chronic pain: Secondary | ICD-10-CM | POA: Diagnosis not present

## 2017-10-31 DIAGNOSIS — I739 Peripheral vascular disease, unspecified: Secondary | ICD-10-CM | POA: Diagnosis not present

## 2017-10-31 NOTE — Telephone Encounter (Signed)
That is fine 

## 2017-10-31 NOTE — Telephone Encounter (Signed)
Detailed message left for Wes on VM with verbal orders

## 2017-11-02 ENCOUNTER — Other Ambulatory Visit: Payer: Self-pay

## 2017-11-02 NOTE — Patient Outreach (Signed)
[facility]) Care Management  11/02/2017  Wallie RenshawRuth J Brubacher 1920-01-28 [id number]   Medication Adherence call to Mrs. Laretta Bolsteruth Mcglothlin patient is showing past due under [facility]on Rosuvastatin 5 mg spoke to patient's daughter she said patient had a stroke and was send to a rehabilitation and while in rehab she fell and had to stay longer she said this is the reason why it is showing  past due on her medication.patient does not need any medication at this time,she also had a question on her copay call [facility] to double check that she was not on the gap,but [employer] said she has been paying $0 copay on this medication through the year,call patient's daughter back she said she was going to call [employer] herself.   Lillia AbedAna Ollison-Moran CPhT Pharmacy Technician [facility] Care Management Direct Dial [phone]  Fax [phone] [email or web address]

## 2017-11-05 DIAGNOSIS — M810 Age-related osteoporosis without current pathological fracture: Secondary | ICD-10-CM | POA: Diagnosis not present

## 2017-11-05 DIAGNOSIS — I480 Paroxysmal atrial fibrillation: Secondary | ICD-10-CM | POA: Diagnosis not present

## 2017-11-05 DIAGNOSIS — I6523 Occlusion and stenosis of bilateral carotid arteries: Secondary | ICD-10-CM | POA: Diagnosis not present

## 2017-11-05 DIAGNOSIS — I2510 Atherosclerotic heart disease of native coronary artery without angina pectoris: Secondary | ICD-10-CM | POA: Diagnosis not present

## 2017-11-05 DIAGNOSIS — M549 Dorsalgia, unspecified: Secondary | ICD-10-CM | POA: Diagnosis not present

## 2017-11-05 DIAGNOSIS — S72142D Displaced intertrochanteric fracture of left femur, subsequent encounter for closed fracture with routine healing: Secondary | ICD-10-CM | POA: Diagnosis not present

## 2017-11-05 DIAGNOSIS — M1991 Primary osteoarthritis, unspecified site: Secondary | ICD-10-CM | POA: Diagnosis not present

## 2017-11-05 DIAGNOSIS — I10 Essential (primary) hypertension: Secondary | ICD-10-CM | POA: Diagnosis not present

## 2017-11-05 DIAGNOSIS — I739 Peripheral vascular disease, unspecified: Secondary | ICD-10-CM | POA: Diagnosis not present

## 2017-11-05 DIAGNOSIS — G8929 Other chronic pain: Secondary | ICD-10-CM | POA: Diagnosis not present

## 2017-11-05 DIAGNOSIS — E785 Hyperlipidemia, unspecified: Secondary | ICD-10-CM | POA: Diagnosis not present

## 2017-11-05 DIAGNOSIS — I252 Old myocardial infarction: Secondary | ICD-10-CM | POA: Diagnosis not present

## 2017-11-05 DIAGNOSIS — G629 Polyneuropathy, unspecified: Secondary | ICD-10-CM | POA: Diagnosis not present

## 2017-11-06 DIAGNOSIS — I739 Peripheral vascular disease, unspecified: Secondary | ICD-10-CM | POA: Diagnosis not present

## 2017-11-06 DIAGNOSIS — E785 Hyperlipidemia, unspecified: Secondary | ICD-10-CM | POA: Diagnosis not present

## 2017-11-06 DIAGNOSIS — M549 Dorsalgia, unspecified: Secondary | ICD-10-CM | POA: Diagnosis not present

## 2017-11-06 DIAGNOSIS — I480 Paroxysmal atrial fibrillation: Secondary | ICD-10-CM | POA: Diagnosis not present

## 2017-11-06 DIAGNOSIS — I252 Old myocardial infarction: Secondary | ICD-10-CM | POA: Diagnosis not present

## 2017-11-06 DIAGNOSIS — M810 Age-related osteoporosis without current pathological fracture: Secondary | ICD-10-CM | POA: Diagnosis not present

## 2017-11-06 DIAGNOSIS — I2510 Atherosclerotic heart disease of native coronary artery without angina pectoris: Secondary | ICD-10-CM | POA: Diagnosis not present

## 2017-11-06 DIAGNOSIS — I10 Essential (primary) hypertension: Secondary | ICD-10-CM | POA: Diagnosis not present

## 2017-11-06 DIAGNOSIS — G8929 Other chronic pain: Secondary | ICD-10-CM | POA: Diagnosis not present

## 2017-11-06 DIAGNOSIS — S72142D Displaced intertrochanteric fracture of left femur, subsequent encounter for closed fracture with routine healing: Secondary | ICD-10-CM | POA: Diagnosis not present

## 2017-11-06 DIAGNOSIS — I6523 Occlusion and stenosis of bilateral carotid arteries: Secondary | ICD-10-CM | POA: Diagnosis not present

## 2017-11-06 DIAGNOSIS — M1991 Primary osteoarthritis, unspecified site: Secondary | ICD-10-CM | POA: Diagnosis not present

## 2017-11-06 DIAGNOSIS — G629 Polyneuropathy, unspecified: Secondary | ICD-10-CM | POA: Diagnosis not present

## 2017-11-12 ENCOUNTER — Ambulatory Visit: Payer: Medicare Other | Admitting: Cardiovascular Disease

## 2017-11-12 ENCOUNTER — Other Ambulatory Visit
Admission: RE | Admit: 2017-11-12 | Discharge: 2017-11-12 | Disposition: A | Discharge status: Home / Self Care | Payer: Medicare Other | Source: Ambulatory Visit | Attending: Cardiovascular Disease | Admitting: Cardiovascular Disease

## 2017-11-12 ENCOUNTER — Encounter: Payer: Self-pay | Admitting: Cardiovascular Disease

## 2017-11-12 VITALS — BP 134/52 | HR 58 | Ht <= 58 in | Wt 105.8 lb

## 2017-11-12 DIAGNOSIS — I25118 Atherosclerotic heart disease of native coronary artery with other forms of angina pectoris: Secondary | ICD-10-CM | POA: Diagnosis not present

## 2017-11-12 DIAGNOSIS — E785 Hyperlipidemia, unspecified: Secondary | ICD-10-CM | POA: Diagnosis not present

## 2017-11-12 DIAGNOSIS — I5022 Chronic systolic (congestive) heart failure: Secondary | ICD-10-CM

## 2017-11-12 DIAGNOSIS — I480 Paroxysmal atrial fibrillation: Secondary | ICD-10-CM | POA: Diagnosis not present

## 2017-11-12 DIAGNOSIS — I10 Essential (primary) hypertension: Secondary | ICD-10-CM | POA: Diagnosis not present

## 2017-11-12 LAB — HEPATIC FUNCTION PANEL
ALK PHOS: 75 U/L (ref 38–126)
ALT: 14 U/L (ref 14–54)
AST: 17 U/L (ref 15–41)
Albumin: 3.7 g/dL (ref 3.5–5.0)
Total Bilirubin: 0.6 mg/dL (ref 0.3–1.2)
Total Protein: 7 g/dL (ref 6.5–8.1)

## 2017-11-12 LAB — TSH: TSH: 4.278 u[IU]/mL (ref 0.350–4.500)

## 2017-11-12 NOTE — Progress Notes (Signed)
Cardiology Office Note   Date:  11/12/2017   ID:  Christina [name], DOB [birth year], MRN [id number]  PCP:  Karie SchwalbeLetvak, Richard I, MD  Cardiologist:   Lorine BearsMuhammad Arida, MD   Chief Complaint  Patient presents with  . other    2 month follow up. Patient states she had some chest pain with morning after taking nitro it went away. Meds reviewed verbally with patient.       History of Present Illness: Christina [name] is a 83 y.o. female who presents for a followup visit regarding coronary artery disease. She had an MI in 5/08 with PCI to ramus and RCA.  She had an echocardiogram done in 2013 which showed normal LV systolic function with mild mitral and aortic insufficiency. Nuclear stress test in 2014 showed no evidence of ischemia with normal ejection fraction. She is known to have moderate bilateral carotid disease. She had recurrent falls since 2016 with previous hip fracture that required surgery in 2017. She was hospitalized in September of this year with non-ST elevation myocardial infarction with a peak troponin of 13.  Echocardiogram showed an EF of 20-25%.  Given her age and frail status, she was treated conservatively.  She had episodes of nonsustained ventricular tachycardia and paroxysmal atrial fibrillation and was treated with amiodarone.  She was deemed to be not a good candidate for anticoagulation given recurrent falls. She has been doing reasonably well .  Dyspnea is stable.  She does complain of dizziness especially with standing up. She did have an episode of chest pain this morning that resolved with nitroglycerin.  She has not used nitroglycerin since she was discharged in September.  Past Medical History:  Diagnosis Date  . CAD (coronary artery disease)    a. S/P previous MI;  b. 04/2007 Cath/PCI: Taxus DES' to mid/distal RCA and RPDA as well as Ramus;  c. myoview 8/09: EF 78%, normal perfusion; d. 12/2012 Low risk MV; e. 08/2017 NSTEMI->Med Rx.  . Carotid stenosis    Bilateral,  mild to moderate  . Chronic back pain   . Chronic shoulder pain   . HFrEF (heart failure with reduced ejection fraction) (HCC)    a. 08/2017 Echo: EF 20-25%, mid-apicalanteroseptal, ant, antlat, and apical sev HK. Mild MR, mildly dil LA, nl RV fxn.  . Hyperlipidemia    Intolerant of many statins  . Hypertension   . Ischemic cardiomyopathy    a. 08/2017 Echo: EF 20-25%, mid-apicalanteroseptal, ant, antlat, and apical sev HK.  . Mild mitral and aortic regurgitation    a. 11/2011 Echo: EF 55-65%, No RWMA, Gr 1 DD, Mild AI/MR; b. 08/2017 Echo: Mild MR.  Marland Kitchen. Neuropathy   . Osteoarthritis   . Osteoporosis   . PAF (paroxysmal atrial fibrillation) (HCC)    a. 08/2017 - noted @ time of NSTEMI. CHA2DS2VASc = 5-->ASA only due to age and h/o falls;  c. On Amio.  Marland Kitchen. Pelvic fracture (HCC) 6/16  . Peripheral vascular disease [facility](HCC)     Past Surgical History:  Procedure Laterality Date  . 2 broken arms     . ABDOMINAL HYSTERECTOMY    . ADENOSINE MYOVIEW  01/2006   No ischemia, EF 70%  . BREAST BIOPSY     Bilateral  . CAROTID U/S  10/2006   0-39% bilaterally  . CATARACT EXTRACTION  09/2006   Right  . CORONARY ANGIOPLASTY  12/2002   Medical rx only  . CORONARY ANGIOPLASTY  01/2006   LAD/PAD disease  .  CORONARY ANGIOPLASTY  03/2007   3 vessel disease  . CORONARY STENT PLACEMENT  2002   MI, [facility]  . CORONARY STENT PLACEMENT  04/2007   RCA/LCX multiple, [facility]  . CP ADMIT  09/2005   Stress myoview negative  . FEMORAL HERNIA REPAIR  05/2007   Incarcerated, right  . FRACTURE SURGERY  03/2009   Left leg  . INTRAMEDULLARY (IM) NAIL INTERTROCHANTERIC Right 05/18/2016   Procedure: INTRAMEDULLARY (IM) NAIL INTERTROCHANTRIC;  Surgeon: [name], MD;  Location: [facility] ORS;  Service: Orthopedics;  Laterality: Right;  . INTRAMEDULLARY (IM) NAIL INTERTROCHANTERIC Left 06/18/2017   Procedure: INTRAMEDULLARY (IM) NAIL INTERTROCHANTRIC;  Surgeon: Deeann Saint, MD;  Location: [facility] ORS;  Service: Orthopedics;   Laterality: Left;  . NM MYOVIEW LTD  11/2004   Stress, negative; NL EF  . two broken wrist on left arm,    . VESICOVAGINAL FISTULA CLOSURE W/ TAH       Current Outpatient Medications  Medication Sig Dispense Refill  . acetaminophen (TYLENOL 8 HOUR) 650 MG CR tablet Take 650 mg by mouth every 8 (eight) hours as needed for pain. Do not exceed 3000 mg / 24 hrs of acetaminophen. Consider all sources    . amiodarone (PACERONE) 200 MG tablet Take 1 tablet (200 mg total) by mouth daily. 30 tablet 3  . aspirin EC 81 MG tablet Take 81 mg by mouth daily.     . carvedilol (COREG) 3.125 MG tablet Take 1 tablet (3.125 mg total) by mouth 2 (two) times daily with a meal. 90 tablet 3  . Cholecalciferol (VITAMIN D3) 2000 units capsule Take 2,000 Units by mouth daily.    . Cyanocobalamin (VITAMIN B 12 PO) Take by mouth. 1000 mcg daily    . ibandronate (BONIVA) 150 MG tablet Take 150 mg by mouth every 30 (thirty) days. Take in the morning with a full glass of water, on an empty stomach, and do not take anything else by mouth or lie down for the next 30 min.    . isosorbide mononitrate (IMDUR) 30 MG 24 hr tablet TAKE 1 TABLET BY MOUTH  DAILY 90 tablet 3  . Lidocaine (ASPERCREME LIDOCAINE) 4 % PTCH Apply 1 patch topically daily. Apply to lower back/sacrum for pain. Remove after 12 hours.    Marland Kitchen lisinopril (PRINIVIL,ZESTRIL) 10 MG tablet TAKE 1 TABLET BY MOUTH  DAILY 90 tablet 3  . MEGARED OMEGA-3 KRILL OIL 500 MG CAPS Take 1 capsule by mouth daily.     . Morphine Sulfate (MORPHINE CONCENTRATE) 10 mg / 0.5 ml concentrated solution Take 0.25-0.5 mLs (5-10 mg total) by mouth every hour as needed for severe pain or shortness of breath. 30 mL 0  . Multiple Vitamins-Minerals (MULTIVITAL) tablet Take 1 tablet by mouth daily.    . nitroGLYCERIN (NITROSTAT) 0.4 MG SL tablet PLACE 1 TABLET (0.4 MG TOTAL) UNDER THE TONGUE EVERY 5 (FIVE) MINUTES AS NEEDED FOR CHEST PAIN. 25 tablet 0  . rosuvastatin (CRESTOR) 5 MG tablet Take  5 mg by mouth daily.    . sennosides-docusate sodium (SENOKOT-S) 8.6-50 MG tablet Take 1 tablet by mouth daily.    . traMADol (ULTRAM) 50 MG tablet Take by mouth every 6 (six) hours as needed.     No current facility-administered medications for this visit.     Allergies:   Bee venom; Plavix [clopidogrel]; Atorvastatin; Diltiazem hcl; Dye fdc red [red dye]; Gemfibrozil; Niacin; and Sulfasalazine    Social History:  The patient  reports that  has never  smoked. she has never used smokeless tobacco. She reports that she does not drink alcohol or use drugs.   Family History:  The patient's family history includes Cancer in her brother and sister; Heart disease in her mother and sister; Pulmonary embolism in her father.    ROS:  Please see the history of present illness.   Otherwise, review of systems are positive for none.   All other systems are reviewed and negative.    PHYSICAL EXAM: VS:  BP (!) 134/52 (BP Location: Right Arm, Patient Position: Sitting, Cuff Size: Normal)   Pulse (!) 58   Ht 4\' 10"  (1.473 m)   Wt 105 lb 12 oz (48 kg)   BMI 22.10 kg/m  , BMI Body mass index is 22.1 kg/m. GEN: Well nourished, well developed, in no acute distress  HEENT: normal  Neck: no JVD, carotid bruits, or masses Cardiac: RRR; no murmurs, rubs, or gallops,no edema  Respiratory:  clear to auscultation bilaterally, normal work of breathing GI: soft, nontender, nondistended, + BS MS: no deformity or atrophy  Skin: warm and dry, no rash Neuro:  Strength and sensation are intact Psych: euthymic mood, full affect   EKG:  EKG is ordered today. The ekg ordered today demonstrates sinus bradycardia, LVH with repolarization abnormalities.  Anterior lateral T wave changes suggestive of ischemia.  Recent Labs: 07/12/2017: TSH 1.482 08/24/2017: ALT 73 08/25/2017: Hemoglobin 12.1; Platelets 298 08/26/2017: BUN 33; Creatinine, Ser 1.21; Magnesium 2.4; Potassium 3.8; Sodium 138    Lipid Panel    Component  Value Date/Time   CHOL 111 07/12/2017 0700   TRIG 112 07/12/2017 0700   HDL 35 (L) 07/12/2017 0700   CHOLHDL 3.2 07/12/2017 0700   VLDL 22 07/12/2017 0700   LDLCALC 54 07/12/2017 0700   LDLDIRECT 77.8 01/28/2010 1058      Wt Readings from Last 3 Encounters:  11/12/17 105 lb 12 oz (48 kg)  10/23/17 100 lb (45.4 kg)  09/17/17 103 lb (46.7 kg)        ASSESSMENT AND PLAN:  1.  Coronary artery disease involving native coronary arteries with other forms of angina:   She is doing reasonably well overall.  Chest pain is not very frequent.  Continue medical therapy.  She is on long-acting nitroglycerin.  2.  Chronic systolic heart failure due to suspected ischemic cardiomyopathy: She appears to be euvolemic.  Continue small dose lisinopril and carvedilol.  3.  Paroxysmal atrial fibrillation: Maintaining in sinus rhythm on amiodarone.  I requested TSH and liver profile.  She is not a candidate for anticoagulation due to recurrent falls.  4.  Essential hypertension: Blood pressure is controlled on current medications.  5.  Hyperlipidemia: Continue small dose rosuvastatin.  6.  Bilateral moderate carotid disease: Continue medical therapy.  Disposition:   FU with me in 4 months  Signed,  Lorine BearsMuhammad Arida, MD  11/12/2017 3:39 PM    [facility]

## 2017-11-12 NOTE — Patient Instructions (Signed)
Medication Instructions:  Your physician recommends that you continue on your current medications as directed. Please refer to the Current Medication list given to you today.   [facility]work[facility] No appt needed.   Testing/Procedures: none  Follow-Up: Your physician wants you to follow-up in: 4 months with Dr. Kirke CorinArida. You will receive a reminder letter in the mail two months in advance. If you don't receive a letter, please call our office to schedule the follow-up appointment.   Any Other Special Instructions Will Be Listed Below (If Applicable).     If you need a refill on your cardiac medications before your next appointment, please call your pharmacy.

## 2017-11-14 DIAGNOSIS — S72142D Displaced intertrochanteric fracture of left femur, subsequent encounter for closed fracture with routine healing: Secondary | ICD-10-CM | POA: Diagnosis not present

## 2017-11-14 DIAGNOSIS — I739 Peripheral vascular disease, unspecified: Secondary | ICD-10-CM | POA: Diagnosis not present

## 2017-11-14 DIAGNOSIS — I252 Old myocardial infarction: Secondary | ICD-10-CM | POA: Diagnosis not present

## 2017-11-14 DIAGNOSIS — M1991 Primary osteoarthritis, unspecified site: Secondary | ICD-10-CM | POA: Diagnosis not present

## 2017-11-14 DIAGNOSIS — G629 Polyneuropathy, unspecified: Secondary | ICD-10-CM | POA: Diagnosis not present

## 2017-11-14 DIAGNOSIS — I2510 Atherosclerotic heart disease of native coronary artery without angina pectoris: Secondary | ICD-10-CM | POA: Diagnosis not present

## 2017-11-14 DIAGNOSIS — E785 Hyperlipidemia, unspecified: Secondary | ICD-10-CM | POA: Diagnosis not present

## 2017-11-14 DIAGNOSIS — G8929 Other chronic pain: Secondary | ICD-10-CM | POA: Diagnosis not present

## 2017-11-14 DIAGNOSIS — M810 Age-related osteoporosis without current pathological fracture: Secondary | ICD-10-CM | POA: Diagnosis not present

## 2017-11-14 DIAGNOSIS — M549 Dorsalgia, unspecified: Secondary | ICD-10-CM | POA: Diagnosis not present

## 2017-11-14 DIAGNOSIS — I10 Essential (primary) hypertension: Secondary | ICD-10-CM | POA: Diagnosis not present

## 2017-11-14 DIAGNOSIS — I6523 Occlusion and stenosis of bilateral carotid arteries: Secondary | ICD-10-CM | POA: Diagnosis not present

## 2017-11-14 DIAGNOSIS — I480 Paroxysmal atrial fibrillation: Secondary | ICD-10-CM | POA: Diagnosis not present

## 2017-11-16 DIAGNOSIS — I10 Essential (primary) hypertension: Secondary | ICD-10-CM | POA: Diagnosis not present

## 2017-11-16 DIAGNOSIS — G629 Polyneuropathy, unspecified: Secondary | ICD-10-CM | POA: Diagnosis not present

## 2017-11-16 DIAGNOSIS — I6523 Occlusion and stenosis of bilateral carotid arteries: Secondary | ICD-10-CM | POA: Diagnosis not present

## 2017-11-16 DIAGNOSIS — I739 Peripheral vascular disease, unspecified: Secondary | ICD-10-CM | POA: Diagnosis not present

## 2017-11-16 DIAGNOSIS — I2510 Atherosclerotic heart disease of native coronary artery without angina pectoris: Secondary | ICD-10-CM | POA: Diagnosis not present

## 2017-11-16 DIAGNOSIS — M549 Dorsalgia, unspecified: Secondary | ICD-10-CM | POA: Diagnosis not present

## 2017-11-16 DIAGNOSIS — I480 Paroxysmal atrial fibrillation: Secondary | ICD-10-CM | POA: Diagnosis not present

## 2017-11-16 DIAGNOSIS — S72142D Displaced intertrochanteric fracture of left femur, subsequent encounter for closed fracture with routine healing: Secondary | ICD-10-CM | POA: Diagnosis not present

## 2017-11-16 DIAGNOSIS — M810 Age-related osteoporosis without current pathological fracture: Secondary | ICD-10-CM | POA: Diagnosis not present

## 2017-11-16 DIAGNOSIS — E785 Hyperlipidemia, unspecified: Secondary | ICD-10-CM | POA: Diagnosis not present

## 2017-11-16 DIAGNOSIS — M1991 Primary osteoarthritis, unspecified site: Secondary | ICD-10-CM | POA: Diagnosis not present

## 2017-11-16 DIAGNOSIS — G8929 Other chronic pain: Secondary | ICD-10-CM | POA: Diagnosis not present

## 2017-11-16 DIAGNOSIS — I252 Old myocardial infarction: Secondary | ICD-10-CM | POA: Diagnosis not present

## 2017-11-19 ENCOUNTER — Ambulatory Visit (INDEPENDENT_AMBULATORY_CARE_PROVIDER_SITE_OTHER): Payer: Medicare Other | Admitting: Internal Medicine

## 2017-11-19 ENCOUNTER — Encounter: Payer: Self-pay | Admitting: Internal Medicine

## 2017-11-19 VITALS — BP 138/72 | HR 56 | Temp 97.7°F | Ht 58.8 in | Wt 107.0 lb

## 2017-11-19 DIAGNOSIS — Z0000 Encounter for general adult medical examination without abnormal findings: Secondary | ICD-10-CM

## 2017-11-19 DIAGNOSIS — I480 Paroxysmal atrial fibrillation: Secondary | ICD-10-CM | POA: Diagnosis not present

## 2017-11-19 DIAGNOSIS — Z7189 Other specified counseling: Secondary | ICD-10-CM

## 2017-11-19 DIAGNOSIS — I5022 Chronic systolic (congestive) heart failure: Secondary | ICD-10-CM

## 2017-11-19 DIAGNOSIS — G3184 Mild cognitive impairment, so stated: Secondary | ICD-10-CM

## 2017-11-19 DIAGNOSIS — F39 Unspecified mood [affective] disorder: Secondary | ICD-10-CM | POA: Diagnosis not present

## 2017-11-19 DIAGNOSIS — Z8781 Personal history of (healed) traumatic fracture: Secondary | ICD-10-CM

## 2017-11-19 DIAGNOSIS — Z23 Encounter for immunization: Secondary | ICD-10-CM | POA: Diagnosis not present

## 2017-11-19 DIAGNOSIS — I25119 Atherosclerotic heart disease of native coronary artery with unspecified angina pectoris: Secondary | ICD-10-CM | POA: Diagnosis not present

## 2017-11-19 NOTE — Progress Notes (Signed)
Hearing Screening (Inadequate exam)   Method: Audiometry   125Hz  250Hz  500Hz  1000Hz  2000Hz  3000Hz  4000Hz  6000Hz  8000Hz   Right ear:   0 20 20  40    Left ear:   0 0 40  0      Visual Acuity Screening   Right eye Left eye Both eyes  Without correction: 20/30 0 20/30  With correction:

## 2017-11-19 NOTE — Assessment & Plan Note (Signed)
I have personally reviewed the Medicare Annual Wellness questionnaire and have noted 1. The patient's medical and social history 2. Their use of alcohol, tobacco or illicit drugs 3. Their current medications and supplements 4. The patient's functional ability including ADL's, fall risks, home safety risks and hearing or visual             impairment. 5. Diet and physical activities 6. Evidence for depression or mood disorders  The patients weight, height, BMI and visual acuity have been recorded in the chart I have made referrals, counseling and provided education to the patient based review of the above and I have provided the pt with a written personalized care plan for preventive services.  I have provided you with a copy of your personalized plan for preventive services. Please take the time to review along with your updated medication list.  Pneumovax booster today Had flu vaccine Discussed strengthening, etc

## 2017-11-19 NOTE — Assessment & Plan Note (Signed)
Chronic dysthymia No MDD

## 2017-11-19 NOTE — Assessment & Plan Note (Signed)
Has improved Done with PT

## 2017-11-19 NOTE — Assessment & Plan Note (Signed)
See social history 

## 2017-11-19 NOTE — Progress Notes (Signed)
Subjective:    Patient ID: Christina RenshawRuth J [name], female    DOB: 10/27/1920, 83 y.o.   MRN: [id number]  HPI Here with daughter for Medicare wellness visit and follow up of chronic health conditions Reviewed form and advanced directives Reviewed other doctors No tobacco or alcohol No set exercise --just finished formal PT  Vision is not good-- blind in left eye for 25 years, right is fading Poor hearing--no aides Has had falls and injuries Gets depressed at times--not daily. No anhedonia Doesn't drive. Needs help with bathing--sponge bath only. Walks with walker in home. Bathroom and dressing is independent. Daughter does cooking and most of the housework. Some memory issues--but nothing worrisome (daughter states memory is very good).  Fell yesterday ---- was scooting up on the couch to get the walker. Fell over with walker Hurt right hand--bruised dorsum and tip of 3rd finger  Hearing music--- "Silent Night" when going down hallway No visual hallucinations No delusional features  Ongoing pain issues Takes tylenol regularly-- at least at night  No chest pain No palpitations Gets regular dizziness---but no syncope No edema  Current Outpatient Medications on File Prior to Visit  Medication Sig Dispense Refill  . acetaminophen (TYLENOL 8 HOUR) 650 MG CR tablet Take 650 mg by mouth every 8 (eight) hours as needed for pain. Do not exceed 3000 mg / 24 hrs of acetaminophen. Consider all sources    . amiodarone (PACERONE) 200 MG tablet Take 1 tablet (200 mg total) by mouth daily. 30 tablet 3  . aspirin EC 81 MG tablet Take 81 mg by mouth daily.     . carvedilol (COREG) 3.125 MG tablet Take 1 tablet (3.125 mg total) by mouth 2 (two) times daily with a meal. 90 tablet 3  . Cholecalciferol (VITAMIN D3) 2000 units capsule Take 2,000 Units by mouth daily.    . Cyanocobalamin (VITAMIN B 12 PO) Take by mouth. 1000 mcg daily    . ibandronate (BONIVA) 150 MG tablet Take 150 mg by mouth every 30  (thirty) days. Take in the morning with a full glass of water, on an empty stomach, and do not take anything else by mouth or lie down for the next 30 min.    . isosorbide mononitrate (IMDUR) 30 MG 24 hr tablet TAKE 1 TABLET BY MOUTH  DAILY 90 tablet 3  . Lidocaine (ASPERCREME LIDOCAINE) 4 % PTCH Apply 1 patch topically daily. Apply to lower back/sacrum for pain. Remove after 12 hours.    Marland Kitchen. lisinopril (PRINIVIL,ZESTRIL) 10 MG tablet TAKE 1 TABLET BY MOUTH  DAILY 90 tablet 3  . MEGARED OMEGA-3 KRILL OIL 500 MG CAPS Take 1 capsule by mouth daily.     . Morphine Sulfate (MORPHINE CONCENTRATE) 10 mg / 0.5 ml concentrated solution Take 0.25-0.5 mLs (5-10 mg total) by mouth every hour as needed for severe pain or shortness of breath. 30 mL 0  . Multiple Vitamins-Minerals (MULTIVITAL) tablet Take 1 tablet by mouth daily.    . nitroGLYCERIN (NITROSTAT) 0.4 MG SL tablet PLACE 1 TABLET (0.4 MG TOTAL) UNDER THE TONGUE EVERY 5 (FIVE) MINUTES AS NEEDED FOR CHEST PAIN. 25 tablet 0  . rosuvastatin (CRESTOR) 5 MG tablet Take 5 mg by mouth daily.    . sennosides-docusate sodium (SENOKOT-S) 8.6-50 MG tablet Take 1 tablet by mouth daily.     No current facility-administered medications on file prior to visit.     Allergies  Allergen Reactions  . Bee Venom Rash  . Plavix [Clopidogrel] Rash  .  Atorvastatin Other (See Comments)    Reaction:  Blisters on feet   . Diltiazem Hcl Other (See Comments)    Reaction:  Unknown   . Dye Fdc Red [Red Dye] Other (See Comments)    Reaction:  Unknown   . Gemfibrozil Other (See Comments)    Reaction:  Unknown   . Niacin Other (See Comments)    Reaction:  Unknown   . Sulfasalazine Rash    Past Medical History:  Diagnosis Date  . CAD (coronary artery disease)    a. S/P previous MI;  b. 04/2007 Cath/PCI: Taxus DES' to mid/distal RCA and RPDA as well as Ramus;  c. myoview 8/09: EF 78%, normal perfusion; d. 12/2012 Low risk MV; e. 08/2017 NSTEMI->Med Rx.  . Carotid stenosis      Bilateral, mild to moderate  . Chronic back pain   . Chronic shoulder pain   . HFrEF (heart failure with reduced ejection fraction) (HCC)    a. 08/2017 Echo: EF 20-25%, mid-apicalanteroseptal, ant, antlat, and apical sev HK. Mild MR, mildly dil LA, nl RV fxn.  . Hyperlipidemia    Intolerant of many statins  . Hypertension   . Ischemic cardiomyopathy    a. 08/2017 Echo: EF 20-25%, mid-apicalanteroseptal, ant, antlat, and apical sev HK.  . Mild mitral and aortic regurgitation    a. 11/2011 Echo: EF 55-65%, No RWMA, Gr 1 DD, Mild AI/MR; b. 08/2017 Echo: Mild MR.  Marland Kitchen Neuropathy   . Osteoarthritis   . Osteoporosis   . PAF (paroxysmal atrial fibrillation) (HCC)    a. 08/2017 - noted @ time of NSTEMI. CHA2DS2VASc = 5-->ASA only due to age and h/o falls;  c. On Amio.  Marland Kitchen Pelvic fracture (HCC) 6/16  . Peripheral vascular disease [facility])     Past Surgical History:  Procedure Laterality Date  . 2 broken arms     . ABDOMINAL HYSTERECTOMY    . ADENOSINE MYOVIEW  01/2006   No ischemia, EF 70%  . BREAST BIOPSY     Bilateral  . CAROTID U/S  10/2006   0-39% bilaterally  . CATARACT EXTRACTION  09/2006   Right  . CORONARY ANGIOPLASTY  12/2002   Medical rx only  . CORONARY ANGIOPLASTY  01/2006   LAD/PAD disease  . CORONARY ANGIOPLASTY  03/2007   3 vessel disease  . CORONARY STENT PLACEMENT  2002   MI, [facility]  . CORONARY STENT PLACEMENT  04/2007   RCA/LCX multiple, [facility]  . CP ADMIT  09/2005   Stress myoview negative  . FEMORAL HERNIA REPAIR  05/2007   Incarcerated, right  . FRACTURE SURGERY  03/2009   Left leg  . INTRAMEDULLARY (IM) NAIL INTERTROCHANTERIC Right 05/18/2016   Procedure: INTRAMEDULLARY (IM) NAIL INTERTROCHANTRIC;  Surgeon: [name], MD;  Location: [facility] ORS;  Service: Orthopedics;  Laterality: Right;  . INTRAMEDULLARY (IM) NAIL INTERTROCHANTERIC Left 06/18/2017   Procedure: INTRAMEDULLARY (IM) NAIL INTERTROCHANTRIC;  Surgeon: Deeann Saint, MD;  Location: [facility] ORS;   Service: Orthopedics;  Laterality: Left;  . NM MYOVIEW LTD  11/2004   Stress, negative; NL EF  . two broken wrist on left arm,    . VESICOVAGINAL FISTULA CLOSURE W/ TAH      Family History  Problem Relation Age of Onset  . Heart disease Mother   . Pulmonary embolism Father   . Heart disease Sister   . Cancer Brother   . Cancer Sister     Social History   Socioeconomic History  . Marital status: Widowed  Spouse name: Not on file  . Number of children: 3  . Years of education: 7012  . Highest education level: Not on file  Social Needs  . Financial resource strain: Not on file  . Food insecurity - worry: Not on file  . Food insecurity - inability: Not on file  . Transportation needs - medical: Not on file  . Transportation needs - non-medical: Not on file  Occupational History    Comment: retired from [employer]  Tobacco Use  . Smoking status: Never Smoker  . Smokeless tobacco: Never Used  Substance and Sexual Activity  . Alcohol use: No  . Drug use: No  . Sexual activity: Not on file  Other Topics Concern  . Not on file  Social History Narrative   Widowed 2005 after long time care for husband after stroke   Remarried but widowed again 11/18      No formal living will   Daughter Carney BernJean should be her health care POA   Has DNR already   No feeding tube if cognitively unaware   Review of Systems Concerned about skin breaking out Not sleeping well---frequent awakening and goes out to couch (occ gets back to sleep) Appetite is good Weight is up some Wears seat belt dentures Lost second husband last month No suspicious skin lesions Bowels okay--no blood Voiding okay No heartburn or dysphagia--but has to be careful with some foods like popcorn    Objective:   Physical Exam  Constitutional: She is oriented to person, place, and time. No distress.  HENT:  Mouth/Throat: Oropharynx is clear and moist. No oropharyngeal exudate.  Full dentures  Neck: No thyromegaly  present.  Cardiovascular: Normal rate, regular rhythm, normal heart sounds and intact distal pulses. Exam reveals no gallop.  No murmur heard. Pulmonary/Chest: Effort normal and breath sounds normal. No respiratory distress. She has no wheezes. She has no rales.  Abdominal: Soft. She exhibits no distension. There is no tenderness. There is no rebound and no guarding.  Musculoskeletal: She exhibits no edema.  Tenderness only at distal phalanx of right 3rd finger  Lymphadenopathy:    She has no cervical adenopathy.  Neurological: She is alert and oriented to person, place, and time.  Stated 2019--- but knew December 3rd President-- "Garnet Koyanagionald Trump, Obama, ?" [phone] D-l-[id number] Recall 0/3  Skin: No rash noted. No erythema.  Psychiatric: She has a normal mood and affect. Her behavior is normal.          Assessment & Plan:

## 2017-11-19 NOTE — Assessment & Plan Note (Signed)
Remains regular ASA only due to falls, etc

## 2017-11-19 NOTE — Assessment & Plan Note (Signed)
No chest pain on the isosorbide 

## 2017-11-19 NOTE — Addendum Note (Signed)
Addended by: Eual FinesBRIDGES, Bellarose Burtt P on: 11/19/2017 12:51 PM   Modules accepted: Orders

## 2017-11-19 NOTE — Assessment & Plan Note (Signed)
Compensated Weight gain seems to be real--not fluid

## 2017-11-19 NOTE — Assessment & Plan Note (Signed)
?  mild decline Some functional issues since last fracture

## 2017-11-22 ENCOUNTER — Other Ambulatory Visit: Payer: Self-pay | Admitting: *Deleted

## 2017-11-22 NOTE — Patient Outreach (Signed)
Unsuccessful telephone encounter to daughter Carney BernJean (on consent form) for Laretta BolsterRuth Snoke, 83 year old female- check on clinical status.   HIPAA compliant voice message left with contact name and number.     Plan: If no response to voice message left, plan to follow up again tomorrow.    Shayne Alkenose M.   Brentley Horrell RN CCM [facility]  [phone]

## 2017-11-23 ENCOUNTER — Other Ambulatory Visit: Payer: Self-pay | Admitting: *Deleted

## 2017-11-23 ENCOUNTER — Encounter: Payer: Self-pay | Admitting: *Deleted

## 2017-11-23 NOTE — Patient Outreach (Signed)
Successful telephone encounter to daughter Carney BernJean (on consent form, resides with pt) for Christina BolsterRuth Brenn, 83 year old female - check on pt's clinical status.   Spoke with daughter, HIPAA identifiers verified on pt.   Daughter reports on pt's status- had a little chest pain a week, took one Nitro SL with relief, no sob or edema.  Daughter reports on recent MD visits (PCP, Heart MD)- fine results.   Daughter  reports on pt's recent fall  11/18/17, bruised finger/hand, PCP looked at it, looks okay now.  Daughter reports pt's back is hurting more now, taking Tylenol- helps.  Daughter reports she continues to stay with pt.  RN CM discussed with daughter plan to [facility]- no further case management needs to which daughter will relay to pt.    Plan:  As discussed with daughter[facility]             Plan to inform Dr. Alphonsus SiasLetvak of discharge, send by in basket case closure letter.            Plan to inform [facility] to close case.    Shayne Alkenose M.   Rowene Suto RN CCM [facility]  [phone]

## 2017-12-08 ENCOUNTER — Other Ambulatory Visit: Payer: Self-pay | Admitting: Internal Medicine

## 2017-12-08 ENCOUNTER — Other Ambulatory Visit: Payer: Self-pay | Admitting: Cardiovascular Disease

## 2017-12-10 ENCOUNTER — Other Ambulatory Visit: Payer: Self-pay

## 2017-12-10 MED ORDER — AMIODARONE HCL 200 MG PO TABS: 200.0000 mg | ORAL_TABLET | Freq: Every day | ORAL | 3 refills | Status: DC

## 2017-12-10 NOTE — Telephone Encounter (Signed)
Refill requested from [facility] for Amiodarone 200 mg.

## 2018-01-26 ENCOUNTER — Other Ambulatory Visit: Payer: Self-pay | Admitting: Internal Medicine

## 2018-02-26 ENCOUNTER — Encounter: Payer: Self-pay | Admitting: Internal Medicine

## 2018-02-26 ENCOUNTER — Ambulatory Visit (INDEPENDENT_AMBULATORY_CARE_PROVIDER_SITE_OTHER): Payer: Medicare Other | Admitting: Internal Medicine

## 2018-02-26 VITALS — BP 112/68 | HR 55 | Temp 97.5°F | Wt 108.0 lb

## 2018-02-26 DIAGNOSIS — I480 Paroxysmal atrial fibrillation: Secondary | ICD-10-CM | POA: Diagnosis not present

## 2018-02-26 DIAGNOSIS — F39 Unspecified mood [affective] disorder: Secondary | ICD-10-CM

## 2018-02-26 DIAGNOSIS — M5489 Other dorsalgia: Secondary | ICD-10-CM

## 2018-02-26 DIAGNOSIS — I5022 Chronic systolic (congestive) heart failure: Secondary | ICD-10-CM | POA: Diagnosis not present

## 2018-02-26 DIAGNOSIS — I25119 Atherosclerotic heart disease of native coronary artery with unspecified angina pectoris: Secondary | ICD-10-CM | POA: Diagnosis not present

## 2018-02-26 DIAGNOSIS — I6523 Occlusion and stenosis of bilateral carotid arteries: Secondary | ICD-10-CM

## 2018-02-26 LAB — CBC
HCT: 38.3 % (ref 36.0–46.0)
Hemoglobin: 12.8 g/dL (ref 12.0–15.0)
MCHC: 33.4 g/dL (ref 30.0–36.0)
MCV: 95.5 fl (ref 78.0–100.0)
Platelets: 214 10*3/uL (ref 150.0–400.0)
RBC: 4.01 Mil/uL (ref 3.87–5.11)
RDW: 15.1 % (ref 11.5–15.5)
WBC: 10.2 10*3/uL (ref 4.0–10.5)

## 2018-02-26 LAB — COMPREHENSIVE METABOLIC PANEL
ALBUMIN: 3.9 g/dL (ref 3.5–5.2)
ALT: 19 U/L (ref 0–35)
AST: 20 U/L (ref 0–37)
Alkaline Phosphatase: 62 U/L (ref 39–117)
BILIRUBIN TOTAL: 0.4 mg/dL (ref 0.2–1.2)
BUN: 24 mg/dL — ABNORMAL HIGH (ref 6–23)
CALCIUM: 10.2 mg/dL (ref 8.4–10.5)
CO2: 29 mEq/L (ref 19–32)
Chloride: 106 mEq/L (ref 96–112)
Creatinine, Ser: 1 mg/dL (ref 0.40–1.20)
GFR: 54.64 mL/min — ABNORMAL LOW (ref >60.00)
Glucose, Bld: 97 mg/dL (ref 70–99)
Potassium: 4.1 mEq/L (ref 3.5–5.1)
Sodium: 141 mEq/L (ref 135–145)
Total Protein: 7.2 g/dL (ref 6.0–8.3)

## 2018-02-26 LAB — T4, FREE: FREE T4: 0.8 ng/dL (ref 0.60–1.60)

## 2018-02-26 MED ORDER — TRAMADOL HCL 50 MG PO TABS: 25.0000 mg | ORAL_TABLET | Freq: Three times a day (TID) | ORAL | 0 refills | Status: DC | PRN

## 2018-02-26 NOTE — Assessment & Plan Note (Signed)
Stable angina pattern for many years No change needed

## 2018-02-26 NOTE — Assessment & Plan Note (Signed)
Regular on amiodarone ASA only due to falls 

## 2018-02-26 NOTE — Assessment & Plan Note (Signed)
Will refill the tramadol

## 2018-02-26 NOTE — Progress Notes (Signed)
Subjective:    Patient ID: Christina [name], female    DOB: 12/04/1920, 84 y.o.   MRN: [id number]  HPI Here with daughter for follow up of multiple medical issues  Still has daughter [name] living with her Daughter does the cooking and housework--she helps a little Bathroom and dressing by herself Still just using washcloth for bath  Appetite is better Weight up slightly--but up 10# since hip fracture  Some chest pain Uses the nitro at times---this helps Breathing is okay but not great---has easy DOE and fatigue No edema Some dizziness upon standing--holds onto walker and waits (and it will pass) No palpitations 1 recent fall in house--got bruised and having some pain in lower back Tramadol has helped--needs refill  Mood okay Misses both husbands No regular depression though  No headaches No facial droop, focal weakness, etc No vision loss  Current Outpatient Medications on File Prior to Visit  Medication Sig Dispense Refill  . acetaminophen (TYLENOL 8 HOUR) 650 MG CR tablet Take 650 mg by mouth every 8 (eight) hours as needed for pain. Do not exceed 3000 mg / 24 hrs of acetaminophen. Consider all sources    . amiodarone (PACERONE) 200 MG tablet Take 1 tablet (200 mg total) by mouth daily. 90 tablet 3  . aspirin EC 81 MG tablet Take 81 mg by mouth daily.     . carvedilol (COREG) 3.125 MG tablet TAKE 1 TABLET BY MOUTH TWO  TIMES DAILY 180 tablet 3  . Cholecalciferol (VITAMIN D3) 2000 units capsule Take 2,000 Units by mouth daily.    . Cyanocobalamin (VITAMIN B 12 PO) Take by mouth. 1000 mcg daily    . ibandronate (BONIVA) 150 MG tablet Take 150 mg by mouth every 30 (thirty) days. Take in the morning with a full glass of water, on an empty stomach, and do not take anything else by mouth or lie down for the next 30 min.    . isosorbide mononitrate (IMDUR) 30 MG 24 hr tablet TAKE 1 TABLET BY MOUTH  DAILY 90 tablet 3  . Lidocaine (ASPERCREME LIDOCAINE) 4 % PTCH Apply 1 patch  topically daily. Apply to lower back/sacrum for pain. Remove after 12 hours.    Marland Kitchen lisinopril (PRINIVIL,ZESTRIL) 10 MG tablet TAKE 1 TABLET BY MOUTH  DAILY 90 tablet 3  . MEGARED OMEGA-3 KRILL OIL 500 MG CAPS Take 1 capsule by mouth daily.     . Morphine Sulfate (MORPHINE CONCENTRATE) 10 mg / 0.5 ml concentrated solution Take 0.25-0.5 mLs (5-10 mg total) by mouth every hour as needed for severe pain or shortness of breath. 30 mL 0  . Multiple Vitamins-Minerals (MULTIVITAL) tablet Take 1 tablet by mouth daily.    . nitroGLYCERIN (NITROSTAT) 0.4 MG SL tablet PLACE 1 TABLET (0.4 MG TOTAL) UNDER THE TONGUE EVERY 5 (FIVE) MINUTES AS NEEDED FOR CHEST PAIN. 25 tablet 0  . rosuvastatin (CRESTOR) 5 MG tablet TAKE 1 TABLET BY MOUTH  DAILY 90 tablet 3  . sennosides-docusate sodium (SENOKOT-S) 8.6-50 MG tablet Take 1 tablet by mouth daily.    . traMADol (ULTRAM) 50 MG tablet Take by mouth.     No current facility-administered medications on file prior to visit.     Allergies  Allergen Reactions  . Bee Venom Rash  . Plavix [Clopidogrel] Rash  . Atorvastatin Other (See Comments)    Reaction:  Blisters on feet   . Diltiazem Hcl Other (See Comments)    Reaction:  Unknown   . Dye [employer]  Red [Red Dye] Other (See Comments)    Reaction:  Unknown   . Gemfibrozil Other (See Comments)    Reaction:  Unknown   . Niacin Other (See Comments)    Reaction:  Unknown   . Sulfasalazine Rash    Past Medical History:  Diagnosis Date  . CAD (coronary artery disease)    a. S/P previous MI;  b. 04/2007 Cath/PCI: Taxus DES' to mid/distal RCA and RPDA as well as Ramus;  c. myoview 8/09: EF 78%, normal perfusion; d. 12/2012 Low risk MV; e. 08/2017 NSTEMI->Med Rx.  . Carotid stenosis    Bilateral, mild to moderate  . Chronic back pain   . Chronic shoulder pain   . HFrEF (heart failure with reduced ejection fraction) (HCC)    a. 08/2017 Echo: EF 20-25%, mid-apicalanteroseptal, ant, antlat, and apical sev HK. Mild MR, mildly  dil LA, nl RV fxn.  . Hyperlipidemia    Intolerant of many statins  . Hypertension   . Ischemic cardiomyopathy    a. 08/2017 Echo: EF 20-25%, mid-apicalanteroseptal, ant, antlat, and apical sev HK.  . Mild mitral and aortic regurgitation    a. 11/2011 Echo: EF 55-65%, No RWMA, Gr 1 DD, Mild AI/MR; b. 08/2017 Echo: Mild MR.  Marland Kitchen. Neuropathy   . Osteoarthritis   . Osteoporosis   . PAF (paroxysmal atrial fibrillation) (HCC)    a. 08/2017 - noted @ time of NSTEMI. CHA2DS2VASc = 5-->ASA only due to age and h/o falls;  c. On Amio.  Marland Kitchen. Pelvic fracture (HCC) 6/16  . Peripheral vascular disease [facility](HCC)     Past Surgical History:  Procedure Laterality Date  . 2 broken arms     . ABDOMINAL HYSTERECTOMY    . ADENOSINE MYOVIEW  01/2006   No ischemia, EF 70%  . BREAST BIOPSY     Bilateral  . CAROTID U/S  10/2006   0-39% bilaterally  . CATARACT EXTRACTION  09/2006   Right  . CORONARY ANGIOPLASTY  12/2002   Medical rx only  . CORONARY ANGIOPLASTY  01/2006   LAD/PAD disease  . CORONARY ANGIOPLASTY  03/2007   3 vessel disease  . CORONARY STENT PLACEMENT  2002   MI, [facility]  . CORONARY STENT PLACEMENT  04/2007   RCA/LCX multiple, [facility]  . CP ADMIT  09/2005   Stress myoview negative  . FEMORAL HERNIA REPAIR  05/2007   Incarcerated, right  . FRACTURE SURGERY  03/2009   Left leg  . INTRAMEDULLARY (IM) NAIL INTERTROCHANTERIC Right 05/18/2016   Procedure: INTRAMEDULLARY (IM) NAIL INTERTROCHANTRIC;  Surgeon: Juanell FairlyKevin Krasinski, MD;  Location: [facility] ORS;  Service: Orthopedics;  Laterality: Right;  . INTRAMEDULLARY (IM) NAIL INTERTROCHANTERIC Left 06/18/2017   Procedure: INTRAMEDULLARY (IM) NAIL INTERTROCHANTRIC;  Surgeon: Deeann SaintMiller, Howard, MD;  Location: [facility] ORS;  Service: Orthopedics;  Laterality: Left;  . NM MYOVIEW LTD  11/2004   Stress, negative; NL EF  . two broken wrist on left arm,    . VESICOVAGINAL FISTULA CLOSURE W/ TAH      Family History  Problem Relation Age of Onset  . Heart disease Mother     . Pulmonary embolism Father   . Heart disease Sister   . Cancer Brother   . Cancer Sister     Social History   Socioeconomic History  . Marital status: Widowed    Spouse name: Not on file  . Number of children: 3  . Years of education: 8712  . Highest education level: Not on file  Social Needs  .  Financial resource strain: Not on file  . Food insecurity - worry: Not on file  . Food insecurity - inability: Not on file  . Transportation needs - medical: Not on file  . Transportation needs - non-medical: Not on file  Occupational History    Comment: retired from [employer]  Tobacco Use  . Smoking status: Never Smoker  . Smokeless tobacco: Never Used  Substance and Sexual Activity  . Alcohol use: No  . Drug use: No  . Sexual activity: Not on file  Other Topics Concern  . Not on file  Social History Narrative   Widowed 2005 after long time care for husband after stroke   Remarried but widowed again 11/18      No formal living will   Daughter [name] should be her health care POA   Has DNR already   No feeding tube if cognitively unaware   Review of Systems  Sleeps okay--but awakens at night and then has to get up (like 5AM) Some daytime fatigue-- falls asleep in day and this will refresh her some Bowels are moving okay     Objective:   Physical Exam  Constitutional: No distress.  Neck: No thyromegaly present.  Cardiovascular: Normal rate, regular rhythm and normal heart sounds. Exam reveals no gallop.  No murmur heard. Pulmonary/Chest: Effort normal and breath sounds normal. No respiratory distress. She has no wheezes. She has no rales.  Musculoskeletal: She exhibits no edema.  Lymphadenopathy:    She has no cervical adenopathy.  Psychiatric: She has a normal mood and affect. Her behavior is normal.          Assessment & Plan:

## 2018-02-26 NOTE — Assessment & Plan Note (Signed)
Grieving still but no MDD, etc No meds

## 2018-02-26 NOTE — Assessment & Plan Note (Signed)
No neuro symptoms On ASA and statin

## 2018-02-26 NOTE — Assessment & Plan Note (Signed)
Compensated. No changes needed. 

## 2018-03-17 ENCOUNTER — Other Ambulatory Visit: Payer: Self-pay | Admitting: Internal Medicine

## 2018-03-22 ENCOUNTER — Other Ambulatory Visit: Payer: Self-pay | Admitting: Internal Medicine

## 2018-04-04 ENCOUNTER — Other Ambulatory Visit: Payer: Self-pay | Admitting: Internal Medicine

## 2018-04-04 NOTE — Telephone Encounter (Signed)
Last filled 02-26-18 #60 Last OV 02-26-18 Next OV 09-02-18  Forward to Mayra ReelKate Clark in Dr Karle StarchLetvak's absence

## 2018-04-04 NOTE — Telephone Encounter (Signed)
Refill sent to pharmacy.   

## 2018-04-21 ENCOUNTER — Encounter: Payer: Self-pay | Admitting: *Deleted

## 2018-04-21 ENCOUNTER — Inpatient Hospital Stay
Admission: EM | Admit: 2018-04-21 | Discharge: 2018-04-26 | DRG: 981 | Disposition: A | Discharge status: Hospice | Payer: Medicare Other | Attending: Internal Medicine | Admitting: Internal Medicine

## 2018-04-21 ENCOUNTER — Other Ambulatory Visit: Payer: Self-pay

## 2018-04-21 DIAGNOSIS — I5022 Chronic systolic (congestive) heart failure: Secondary | ICD-10-CM | POA: Diagnosis present

## 2018-04-21 DIAGNOSIS — K922 Gastrointestinal hemorrhage, unspecified: Secondary | ICD-10-CM | POA: Diagnosis not present

## 2018-04-21 DIAGNOSIS — I255 Ischemic cardiomyopathy: Secondary | ICD-10-CM | POA: Diagnosis present

## 2018-04-21 DIAGNOSIS — I10 Essential (primary) hypertension: Secondary | ICD-10-CM | POA: Diagnosis not present

## 2018-04-21 DIAGNOSIS — D500 Iron deficiency anemia secondary to blood loss (chronic): Secondary | ICD-10-CM | POA: Diagnosis not present

## 2018-04-21 DIAGNOSIS — I2510 Atherosclerotic heart disease of native coronary artery without angina pectoris: Secondary | ICD-10-CM | POA: Diagnosis not present

## 2018-04-21 DIAGNOSIS — Z515 Encounter for palliative care: Secondary | ICD-10-CM | POA: Diagnosis not present

## 2018-04-21 DIAGNOSIS — Z955 Presence of coronary angioplasty implant and graft: Secondary | ICD-10-CM | POA: Diagnosis not present

## 2018-04-21 DIAGNOSIS — I252 Old myocardial infarction: Secondary | ICD-10-CM | POA: Diagnosis not present

## 2018-04-21 DIAGNOSIS — G629 Polyneuropathy, unspecified: Secondary | ICD-10-CM | POA: Diagnosis not present

## 2018-04-21 DIAGNOSIS — I080 Rheumatic disorders of both mitral and aortic valves: Secondary | ICD-10-CM | POA: Diagnosis not present

## 2018-04-21 DIAGNOSIS — Z66 Do not resuscitate: Secondary | ICD-10-CM | POA: Diagnosis present

## 2018-04-21 DIAGNOSIS — E785 Hyperlipidemia, unspecified: Secondary | ICD-10-CM | POA: Diagnosis present

## 2018-04-21 DIAGNOSIS — Z7982 Long term (current) use of aspirin: Secondary | ICD-10-CM

## 2018-04-21 DIAGNOSIS — M810 Age-related osteoporosis without current pathological fracture: Secondary | ICD-10-CM | POA: Diagnosis present

## 2018-04-21 DIAGNOSIS — E43 Unspecified severe protein-calorie malnutrition: Secondary | ICD-10-CM

## 2018-04-21 DIAGNOSIS — D649 Anemia, unspecified: Secondary | ICD-10-CM | POA: Diagnosis not present

## 2018-04-21 DIAGNOSIS — I739 Peripheral vascular disease, unspecified: Secondary | ICD-10-CM | POA: Diagnosis present

## 2018-04-21 DIAGNOSIS — I480 Paroxysmal atrial fibrillation: Secondary | ICD-10-CM | POA: Diagnosis present

## 2018-04-21 DIAGNOSIS — I110 Hypertensive heart disease with heart failure: Secondary | ICD-10-CM | POA: Diagnosis present

## 2018-04-21 DIAGNOSIS — R578 Other shock: Secondary | ICD-10-CM | POA: Diagnosis not present

## 2018-04-21 DIAGNOSIS — K625 Hemorrhage of anus and rectum: Secondary | ICD-10-CM | POA: Diagnosis not present

## 2018-04-21 DIAGNOSIS — Z7189 Other specified counseling: Secondary | ICD-10-CM

## 2018-04-21 DIAGNOSIS — K5791 Diverticulosis of intestine, part unspecified, without perforation or abscess with bleeding: Secondary | ICD-10-CM | POA: Diagnosis not present

## 2018-04-21 DIAGNOSIS — R079 Chest pain, unspecified: Secondary | ICD-10-CM | POA: Diagnosis not present

## 2018-04-21 DIAGNOSIS — Z79899 Other long term (current) drug therapy: Secondary | ICD-10-CM | POA: Diagnosis not present

## 2018-04-21 DIAGNOSIS — Z6821 Body mass index (BMI) 21.0-21.9, adult: Secondary | ICD-10-CM | POA: Diagnosis not present

## 2018-04-21 DIAGNOSIS — I25119 Atherosclerotic heart disease of native coronary artery with unspecified angina pectoris: Secondary | ICD-10-CM | POA: Diagnosis present

## 2018-04-21 DIAGNOSIS — D72829 Elevated white blood cell count, unspecified: Secondary | ICD-10-CM | POA: Diagnosis present

## 2018-04-21 LAB — COMPREHENSIVE METABOLIC PANEL
ALK PHOS: 54 U/L (ref 38–126)
ALT: 16 U/L (ref 14–54)
AST: 21 U/L (ref 15–41)
Albumin: 3.3 g/dL — ABNORMAL LOW (ref 3.5–5.0)
Anion gap: 6 (ref 5–15)
BUN: 21 mg/dL — AB (ref 6–20)
CALCIUM: 9 mg/dL (ref 8.9–10.3)
CHLORIDE: 110 mmol/L (ref 101–111)
CO2: 25 mmol/L (ref 22–32)
CREATININE: 0.98 mg/dL (ref 0.44–1.00)
GFR calc non Af Amer: 47 mL/min — ABNORMAL LOW (ref >60)
GFR, EST AFRICAN AMERICAN: 55 mL/min — AB (ref >60)
Glucose, Bld: 139 mg/dL — ABNORMAL HIGH (ref 65–99)
Potassium: 3.3 mmol/L — ABNORMAL LOW (ref 3.5–5.1)
Sodium: 141 mmol/L (ref 135–145)
Total Bilirubin: 0.5 mg/dL (ref 0.3–1.2)
Total Protein: 6.1 g/dL — ABNORMAL LOW (ref 6.5–8.1)

## 2018-04-21 LAB — CBC WITH DIFFERENTIAL/PLATELET
BASOS ABS: 0 10*3/uL (ref 0–0.1)
Basophils Relative: 1 %
EOS PCT: 2 %
Eosinophils Absolute: 0.1 10*3/uL (ref 0–0.7)
HCT: 33.5 % — ABNORMAL LOW (ref 35.0–47.0)
HEMOGLOBIN: 11.3 g/dL — AB (ref 12.0–16.0)
LYMPHS ABS: 1 10*3/uL (ref 1.0–3.6)
LYMPHS PCT: 17 %
MCH: 32.6 pg (ref 26.0–34.0)
MCHC: 33.7 g/dL (ref 32.0–36.0)
MCV: 96.7 fL (ref 80.0–100.0)
Monocytes Absolute: 0.4 10*3/uL (ref 0.2–0.9)
Monocytes Relative: 7 %
NEUTROS ABS: 4.3 10*3/uL (ref 1.4–6.5)
NEUTROS PCT: 73 %
PLATELETS: 221 10*3/uL (ref 150–440)
RBC: 3.47 MIL/uL — AB (ref 3.80–5.20)
RDW: 15.2 % — ABNORMAL HIGH (ref 11.5–14.5)
WBC: 5.8 10*3/uL (ref 3.6–11.0)

## 2018-04-21 LAB — PROTIME-INR
INR: 1.05
Prothrombin Time: 13.6 seconds (ref 11.4–15.2)

## 2018-04-21 NOTE — ED Notes (Signed)
MD McShane at bedside with this RN to perform rectal exam for occult blood.

## 2018-04-21 NOTE — ED Triage Notes (Signed)
Per EMS report, patient c/o rectal bleed "spotting" on and off for two days. Today, patient noticed a large amount of red blood with clots.

## 2018-04-21 NOTE — H&P (Signed)
[facility] - [place] at [facility]   PATIENT NAME: Christina [name]    MR#:  [id number]  DATE OF BIRTH:  1920-06-06  DATE OF ADMISSION:  04/21/2018  PRIMARY CARE PHYSICIAN: Karie Schwalbe, MD   REQUESTING/REFERRING PHYSICIAN: Alphonzo Lemmings, MD  CHIEF COMPLAINT:   Chief Complaint  Patient presents with  . Rectal Bleeding    HISTORY OF PRESENT ILLNESS:  Christina [name]  is a 84 y.o. female who presents with an episode of bright red blood per rectum.  Patient has been having spotting bleeding with her stool over the last several days, but tonight had a large volume episode of bleeding.  She states she has not had anything like this in the past before.  She denies any abdominal pain hemoglobin is stable here, hospitalist called for admission  PAST MEDICAL HISTORY:   Past Medical History:  Diagnosis Date  . CAD (coronary artery disease)    a. S/P previous MI;  b. 04/2007 Cath/PCI: Taxus DES' to mid/distal RCA and RPDA as well as Ramus;  c. myoview 8/09: EF 78%, normal perfusion; d. 12/2012 Low risk MV; e. 08/2017 NSTEMI->Med Rx.  . Carotid stenosis    Bilateral, mild to moderate  . Chronic back pain   . Chronic shoulder pain   . HFrEF (heart failure with reduced ejection fraction) (HCC)    a. 08/2017 Echo: EF 20-25%, mid-apicalanteroseptal, ant, antlat, and apical sev HK. Mild MR, mildly dil LA, nl RV fxn.  . Hyperlipidemia    Intolerant of many statins  . Hypertension   . Ischemic cardiomyopathy    a. 08/2017 Echo: EF 20-25%, mid-apicalanteroseptal, ant, antlat, and apical sev HK.  . Mild mitral and aortic regurgitation    a. 11/2011 Echo: EF 55-65%, No RWMA, Gr 1 DD, Mild AI/MR; b. 08/2017 Echo: Mild MR.  Marland Kitchen Neuropathy   . Osteoarthritis   . Osteoporosis   . PAF (paroxysmal atrial fibrillation) (HCC)    a. 08/2017 - noted @ time of NSTEMI. CHA2DS2VASc = 5-->ASA only due to age and h/o falls;  c. On Amio.  Marland Kitchen Pelvic fracture (HCC) 6/16  . Peripheral vascular disease (HCC)       PAST SURGICAL HISTORY:   Past Surgical History:  Procedure Laterality Date  . 2 broken arms     . ABDOMINAL HYSTERECTOMY    . ADENOSINE MYOVIEW  01/2006   No ischemia, EF 70%  . BREAST BIOPSY     Bilateral  . CAROTID U/S  10/2006   0-39% bilaterally  . CATARACT EXTRACTION  09/2006   Right  . CORONARY ANGIOPLASTY  12/2002   Medical rx only  . CORONARY ANGIOPLASTY  01/2006   LAD/PAD disease  . CORONARY ANGIOPLASTY  03/2007   3 vessel disease  . CORONARY STENT PLACEMENT  2002   MI, [facility]  . CORONARY STENT PLACEMENT  04/2007   RCA/LCX multiple, [facility]  . CP ADMIT  09/2005   Stress myoview negative  . FEMORAL HERNIA REPAIR  05/2007   Incarcerated, right  . FRACTURE SURGERY  03/2009   Left leg  . hip fracture repair Bilateral   . INTRAMEDULLARY (IM) NAIL INTERTROCHANTERIC Right 05/18/2016   Procedure: INTRAMEDULLARY (IM) NAIL INTERTROCHANTRIC;  Surgeon: [name], MD;  Location: [facility] ORS;  Service: Orthopedics;  Laterality: Right;  . INTRAMEDULLARY (IM) NAIL INTERTROCHANTERIC Left 06/18/2017   Procedure: INTRAMEDULLARY (IM) NAIL INTERTROCHANTRIC;  Surgeon: Deeann Saint, MD;  Location: [facility] ORS;  Service: Orthopedics;  Laterality: Left;  . NM MYOVIEW  LTD  11/2004   Stress, negative; NL EF  . two broken wrist on left arm,    . VESICOVAGINAL FISTULA CLOSURE W/ TAH       SOCIAL HISTORY:   Social History   Tobacco Use  . Smoking status: Never Smoker  . Smokeless tobacco: Never Used  Substance Use Topics  . Alcohol use: No     FAMILY HISTORY:   Family History  Problem Relation Age of Onset  . Heart disease Mother   . Pulmonary embolism Father   . Heart disease Sister   . Cancer Brother   . Cancer Sister      DRUG ALLERGIES:   Allergies  Allergen Reactions  . Bee Venom Rash  . Plavix [Clopidogrel] Rash  . Atorvastatin Other (See Comments)    Reaction:  Blisters on feet   . Diltiazem Hcl Other (See Comments)    Reaction:  Unknown   . Dye Fdc  Red [Red Dye] Other (See Comments)    Reaction:  Unknown   . Gemfibrozil Other (See Comments)    Reaction:  Unknown   . Niacin Other (See Comments)    Reaction:  Unknown   . Sulfasalazine Rash    MEDICATIONS AT HOME:   Prior to Admission medications   Medication Sig Start Date End Date Taking? Authorizing Provider  acetaminophen (TYLENOL 8 HOUR) 650 MG CR tablet Take 650 mg by mouth every 8 (eight) hours as needed for pain. Do not exceed 3000 mg / 24 hrs of acetaminophen. Consider all sources   Yes [provider]  amiodarone (PACERONE) 200 MG tablet Take 1 tablet (200 mg total) by mouth daily. 12/10/17  Yes Iran Ouch, MD  aspirin EC 81 MG tablet Take 81 mg by mouth daily.    Yes [provider]  carvedilol (COREG) 3.125 MG tablet TAKE 1 TABLET BY MOUTH TWO  TIMES DAILY 01/28/18  Yes Karie Schwalbe, MD  cholecalciferol (VITAMIN D) 1000 units tablet Take 1,000 Units by mouth daily.    Yes [provider]  cyanocobalamin 1000 MCG tablet Take 1,000 mcg by mouth daily.   Yes [provider]  ibandronate (BONIVA) 150 MG tablet Take 1 tablet (150 mg total) by mouth every 30 (thirty) days. 03/18/18  Yes Karie Schwalbe, MD  isosorbide mononitrate (IMDUR) 30 MG 24 hr tablet TAKE 1 TABLET BY MOUTH  DAILY 12/10/17  Yes Iran Ouch, MD  Krill Oil 350 MG CAPS Take 350 mg by mouth daily.   Yes [provider]  Lidocaine (ASPERCREME LIDOCAINE) 4 % PTCH Apply 1 patch topically daily. Apply to lower back/sacrum for pain. Remove after 12 hours.   Yes [provider]  lisinopril (PRINIVIL,ZESTRIL) 10 MG tablet TAKE 1 TABLET BY MOUTH  DAILY 03/22/18  Yes Karie Schwalbe, MD  Morphine Sulfate (MORPHINE CONCENTRATE) 10 mg / 0.5 ml concentrated solution Take 0.25-0.5 mLs (5-10 mg total) by mouth every hour as needed for severe pain or shortness of breath. 09/05/17  Yes Karie Schwalbe, MD  Multiple Vitamins-Minerals (MULTIVITAL) tablet Take 1  tablet by mouth daily.   Yes [provider]  nitroGLYCERIN (NITROSTAT) 0.4 MG SL tablet PLACE 1 TABLET (0.4 MG TOTAL) UNDER THE TONGUE EVERY 5 (FIVE) MINUTES AS NEEDED FOR CHEST PAIN. 09/19/17  Yes Tillman Abide I, MD  rosuvastatin (CRESTOR) 5 MG tablet TAKE 1 TABLET BY MOUTH  DAILY 12/10/17  Yes Karie Schwalbe, MD  sennosides-docusate sodium (SENOKOT-S) 8.6-50 MG tablet Take  1 tablet by mouth daily.   Yes [provider]  traMADol (ULTRAM) 50 MG tablet TAKE 0.5-1 TABLETS (25-50 MG TOTAL) BY MOUTH 3 (THREE) TIMES DAILY AS NEEDED. Patient taking differently: Take 25-50 mg by mouth 3 (three) times daily as needed for moderate pain.  04/04/18  Yes Doreene Nest, NP    REVIEW OF SYSTEMS:  Review of Systems  Constitutional: Negative for chills, fever, malaise/fatigue and weight loss.  HENT: Negative for ear pain, hearing loss and tinnitus.   Eyes: Negative for blurred vision, double vision, pain and redness.  Respiratory: Negative for cough, hemoptysis and shortness of breath.   Cardiovascular: Negative for chest pain, palpitations, orthopnea and leg swelling.  Gastrointestinal: Positive for blood in stool. Negative for abdominal pain, constipation, diarrhea, nausea and vomiting.  Genitourinary: Negative for dysuria, frequency and hematuria.  Musculoskeletal: Negative for back pain, joint pain and neck pain.  Skin:       No acne, rash, or lesions  Neurological: Negative for dizziness, tremors, focal weakness and weakness.  Endo/Heme/Allergies: Negative for polydipsia. Does not bruise/bleed easily.  Psychiatric/Behavioral: Negative for depression. The patient is not nervous/anxious and does not have insomnia.      VITAL SIGNS:   Vitals:   04/21/18 2035 04/21/18 2036 04/21/18 2208  BP:  (!) 149/66 134/63  Pulse:  (!) 52 (!) 51  Resp:  18 18  Temp:  (!) 97.2 F (36.2 C)   TempSrc:  Oral   SpO2:  93% 99%  Weight: 48.1 kg (106 lb)    Height: 5' (1.524 m)     Wt  Readings from Last 3 Encounters:  04/21/18 48.1 kg (106 lb)  02/26/18 49 kg (108 lb)  11/19/17 48.5 kg (107 lb)    PHYSICAL EXAMINATION:  Physical Exam  Vitals reviewed. Constitutional: She is oriented to person, place, and time. She appears well-developed and well-nourished. No distress.  HENT:  Head: Normocephalic and atraumatic.  Mouth/Throat: Oropharynx is clear and moist.  Eyes: Pupils are equal, round, and reactive to light. Conjunctivae and EOM are normal. No scleral icterus.  Neck: Normal range of motion. Neck supple. No JVD present. No thyromegaly present.  Cardiovascular: Normal rate, regular rhythm and intact distal pulses. Exam reveals no gallop and no friction rub.  No murmur heard. Respiratory: Effort normal and breath sounds normal. No respiratory distress. She has no wheezes. She has no rales.  GI: Soft. Bowel sounds are normal. She exhibits no distension. There is no tenderness.  Musculoskeletal: Normal range of motion. She exhibits no edema.  No arthritis, no gout  Lymphadenopathy:    She has no cervical adenopathy.  Neurological: She is alert and oriented to person, place, and time. No cranial nerve deficit.  No dysarthria, no aphasia  Skin: Skin is warm and dry. No rash noted. No erythema.  Psychiatric: She has a normal mood and affect. Her behavior is normal. Judgment and thought content normal.    LABORATORY PANEL:   CBC Recent Labs  Lab 04/21/18 2045  WBC 5.8  HGB 11.3*  HCT 33.5*  PLT 221   ------------------------------------------------------------------------------------------------------------------  Chemistries  Recent Labs  Lab 04/21/18 2045  NA 141  K 3.3*  CL 110  CO2 25  GLUCOSE 139*  BUN 21*  CREATININE 0.98  CALCIUM 9.0  AST 21  ALT 16  ALKPHOS 54  BILITOT 0.5   ------------------------------------------------------------------------------------------------------------------  Cardiac Enzymes No results for input(s):  TROPONINI in the last 168 hours. ------------------------------------------------------------------------------------------------------------------  RADIOLOGY:  No results  found.  EKG:   Orders placed or performed in visit on 11/12/17  . EKG 12-Lead    IMPRESSION AND PLAN:  Principal Problem:   BRBPR (bright red blood per rectum) -suspect lower GI bleed, patient takes stool softener due to chronic constipation raising the possibility of diverticulosis.  Hemoglobin currently stable, monitor, GI consult Active Problems:   Essential hypertension -continue home antihypertensives   Coronary artery disease -continue home medications   Chronic systolic heart failure (HCC) -continue home meds   Paroxysmal atrial fibrillation (HCC) -continue home rate controlling medications, patient is not on anticoagulation   Hyperlipidemia -Home dose antilipid  Chart review performed and case discussed with ED provider. Labs, imaging and/or ECG reviewed by provider and discussed with patient/family. Management plans discussed with the patient and/or family.  DVT PROPHYLAXIS: Mechanical only  GI PROPHYLAXIS: None  ADMISSION STATUS: Inpatient  CODE STATUS: DNR Code Status History    Date Active Date Inactive Code Status Order ID Comments User Context   08/24/2017 1605 08/27/2017 1929 DNR [id number]  Milagros Loll, MD ED   06/18/2017 1543 06/20/2017 1716 DNR [id number]  Enid Baas, MD ED   05/17/2016 1517 05/22/2016 2105 DNR [id number]  Ramonita Lab, MD Inpatient   05/17/2016 1243 05/17/2016 1243 Full Code [id number]  [name], MD ED   07/02/2015 1350 07/03/2015 1533 DNR [id number]  Altamese Dilling, MD Inpatient   07/02/2015 1129 07/02/2015 1350 Full Code [id number]  Altamese Dilling, MD Inpatient   06/09/2015 1835 06/11/2015 1450 DNR [id number]  Auburn Bilberry, MD Inpatient    Questions for Most Recent Historical Code Status (Order [id number])    Question Answer Comment   In the event of  cardiac or respiratory ARREST Do not call a "code blue"    In the event of cardiac or respiratory ARREST Do not perform Intubation, CPR, defibrillation or ACLS    In the event of cardiac or respiratory ARREST Use medication by any route, position, wound care, and other measures to relive pain and suffering. May use oxygen, suction and manual treatment of airway obstruction as needed for comfort.         Advance Directive Documentation     Most Recent Value  Type of Advance Directive  Healthcare Power of Attorney  Pre-existing out of facility DNR order (yellow form or pink MOST form)  -  "MOST" Form in Place?  -      TOTAL TIME TAKING CARE OF THIS PATIENT: 45 minutes.   Christina [name] 04/21/2018, 10:38 PM  [facility]  Office  [phone]  CC: Primary care physician; Karie Schwalbe, MD  Note:  This document was prepared using Dragon voice recognition software and may include unintentional dictation errors.

## 2018-04-21 NOTE — ED Provider Notes (Signed)
[facility] Emergency Department Provider Note  ____________________________________________   I have reviewed the triage vital signs and the nursing notes. Where available I have reviewed prior notes and, if possible and indicated, outside hospital notes.    HISTORY  Chief Complaint Rectal Bleeding    HPI ABI SHOULTS is a 84 y.o. female   On aspirin no other blood thinners per family, presents today complaining of bright red blood per rectum.  Patient had spotting for the last couple days no pain, and then had a large bloody bowel movement today.  EMS reported that there was clots in the toilet.  Patient has had no other symptoms.  No vomiting no hematemesis no melena.  She denies any abdominal pain nothing makes it better nothing makes it worse no antecedent treatment,  Patient and family cannot recollect having had this before  Past Medical History:  Diagnosis Date  . CAD (coronary artery disease)    a. S/P previous MI;  b. 04/2007 Cath/PCI: Taxus DES' to mid/distal RCA and RPDA as well as Ramus;  c. myoview 8/09: EF 78%, normal perfusion; d. 12/2012 Low risk MV; e. 08/2017 NSTEMI->Med Rx.  . Carotid stenosis    Bilateral, mild to moderate  . Chronic back pain   . Chronic shoulder pain   . HFrEF (heart failure with reduced ejection fraction) (HCC)    a. 08/2017 Echo: EF 20-25%, mid-apicalanteroseptal, ant, antlat, and apical sev HK. Mild MR, mildly dil LA, nl RV fxn.  . Hyperlipidemia    Intolerant of many statins  . Hypertension   . Ischemic cardiomyopathy    a. 08/2017 Echo: EF 20-25%, mid-apicalanteroseptal, ant, antlat, and apical sev HK.  . Mild mitral and aortic regurgitation    a. 11/2011 Echo: EF 55-65%, No RWMA, Gr 1 DD, Mild AI/MR; b. 08/2017 Echo: Mild MR.  Marland Kitchen Neuropathy   . Osteoarthritis   . Osteoporosis   . PAF (paroxysmal atrial fibrillation) (HCC)    a. 08/2017 - noted @ time of NSTEMI. CHA2DS2VASc = 5-->ASA only due to age and h/o falls;   c. On Amio.  Marland Kitchen Pelvic fracture (HCC) 6/16  . Peripheral vascular disease [facility])     Patient Active Problem List   Diagnosis Date Noted  . Paroxysmal atrial fibrillation (HCC) 09/05/2017  . Chronic systolic heart failure (HCC) 09/04/2017  . S/p left hip fracture 08/08/2017  . Vitamin D deficiency 07/23/2017  . Vitamin B12 deficiency 07/23/2017  . Mood disorder (HCC) 09/06/2015  . Back pain without sciatica 07/26/2015  . Osteoarthritis, multiple sites 03/04/2015  . Advanced directives, counseling/discussion 09/04/2014  . Mild cognitive impairment 09/04/2014  . Routine general medical examination at a health care facility 08/26/2013  . Carotid arterial disease (HCC)   . Hyperlipidemia 12/04/2007  . Essential hypertension 06/04/2007  . Coronary artery disease with angina pectoris (HCC) 06/04/2007  . Osteoporosis 06/04/2007    Past Surgical History:  Procedure Laterality Date  . 2 broken arms     . ABDOMINAL HYSTERECTOMY    . ADENOSINE MYOVIEW  01/2006   No ischemia, EF 70%  . BREAST BIOPSY     Bilateral  . CAROTID U/S  10/2006   0-39% bilaterally  . CATARACT EXTRACTION  09/2006   Right  . CORONARY ANGIOPLASTY  12/2002   Medical rx only  . CORONARY ANGIOPLASTY  01/2006   LAD/PAD disease  . CORONARY ANGIOPLASTY  03/2007   3 vessel disease  . CORONARY STENT PLACEMENT  2002   MI,  [facility]  . CORONARY STENT PLACEMENT  04/2007   RCA/LCX multiple, [facility]  . CP ADMIT  09/2005   Stress myoview negative  . FEMORAL HERNIA REPAIR  05/2007   Incarcerated, right  . FRACTURE SURGERY  03/2009   Left leg  . INTRAMEDULLARY (IM) NAIL INTERTROCHANTERIC Right 05/18/2016   Procedure: INTRAMEDULLARY (IM) NAIL INTERTROCHANTRIC;  Surgeon: [name], MD;  Location: [facility] ORS;  Service: Orthopedics;  Laterality: Right;  . INTRAMEDULLARY (IM) NAIL INTERTROCHANTERIC Left 06/18/2017   Procedure: INTRAMEDULLARY (IM) NAIL INTERTROCHANTRIC;  Surgeon: Deeann Saint, MD;  Location: [facility] ORS;  Service:  Orthopedics;  Laterality: Left;  . NM MYOVIEW LTD  11/2004   Stress, negative; NL EF  . two broken wrist on left arm,    . VESICOVAGINAL FISTULA CLOSURE W/ TAH      Prior to Admission medications   Medication Sig Start Date End Date Taking? Authorizing Provider  acetaminophen (TYLENOL 8 HOUR) 650 MG CR tablet Take 650 mg by mouth every 8 (eight) hours as needed for pain. Do not exceed 3000 mg / 24 hrs of acetaminophen. Consider all sources    [provider]  amiodarone (PACERONE) 200 MG tablet Take 1 tablet (200 mg total) by mouth daily. 12/10/17   Iran Ouch, MD  aspirin EC 81 MG tablet Take 81 mg by mouth daily.     [provider]  carvedilol (COREG) 3.125 MG tablet TAKE 1 TABLET BY MOUTH TWO  TIMES DAILY 01/28/18   Karie Schwalbe, MD  Cholecalciferol (VITAMIN D3) 2000 units capsule Take 2,000 Units by mouth daily.    [provider]  Cyanocobalamin (VITAMIN B 12 PO) Take by mouth. 1000 mcg daily    [provider]  ibandronate (BONIVA) 150 MG tablet Take 1 tablet (150 mg total) by mouth every 30 (thirty) days. 03/18/18   Karie Schwalbe, MD  isosorbide mononitrate (IMDUR) 30 MG 24 hr tablet TAKE 1 TABLET BY MOUTH  DAILY 12/10/17   Iran Ouch, MD  Lidocaine (ASPERCREME LIDOCAINE) 4 % PTCH Apply 1 patch topically daily. Apply to lower back/sacrum for pain. Remove after 12 hours.    [provider]  lisinopril (PRINIVIL,ZESTRIL) 10 MG tablet TAKE 1 TABLET BY MOUTH  DAILY 03/22/18   Karie Schwalbe, MD  MEGARED OMEGA-3 KRILL OIL 500 MG CAPS Take 1 capsule by mouth daily.     [provider]  Morphine Sulfate (MORPHINE CONCENTRATE) 10 mg / 0.5 ml concentrated solution Take 0.25-0.5 mLs (5-10 mg total) by mouth every hour as needed for severe pain or shortness of breath. 09/05/17   Karie Schwalbe, MD  Multiple Vitamins-Minerals (MULTIVITAL) tablet Take 1 tablet by mouth daily.    [provider]  nitroGLYCERIN  (NITROSTAT) 0.4 MG SL tablet PLACE 1 TABLET (0.4 MG TOTAL) UNDER THE TONGUE EVERY 5 (FIVE) MINUTES AS NEEDED FOR CHEST PAIN. 09/19/17   Karie Schwalbe, MD  rosuvastatin (CRESTOR) 5 MG tablet TAKE 1 TABLET BY MOUTH  DAILY 12/10/17   Karie Schwalbe, MD  sennosides-docusate sodium (SENOKOT-S) 8.6-50 MG tablet Take 1 tablet by mouth daily.    [provider]  traMADol (ULTRAM) 50 MG tablet TAKE 0.5-1 TABLETS (25-50 MG TOTAL) BY MOUTH 3 (THREE) TIMES DAILY AS NEEDED. 04/04/18   Doreene Nest, NP    Allergies Bee venom; Plavix [clopidogrel]; Atorvastatin; Diltiazem hcl; Dye fdc red [red dye]; Gemfibrozil; Niacin; and Sulfasalazine  Family History  Problem Relation Age of Onset  . Heart  disease Mother   . Pulmonary embolism Father   . Heart disease Sister   . Cancer Brother   . Cancer Sister     Social History Social History   Tobacco Use  . Smoking status: Never Smoker  . Smokeless tobacco: Never Used  Substance Use Topics  . Alcohol use: No  . Drug use: No    Review of Systems Constitutional: No fever/chills Eyes: No visual changes. ENT: No sore throat. No stiff neck no neck pain Cardiovascular: Denies chest pain. Respiratory: Denies shortness of breath. Gastrointestinal:   no vomiting.  No diarrhea.  No constipation. Genitourinary: Negative for dysuria. Musculoskeletal: Negative lower extremity swelling Skin: Negative for rash. Neurological: Negative for severe headaches, focal weakness or numbness.   ____________________________________________   PHYSICAL EXAM:  VITAL SIGNS: ED Triage Vitals  Enc Vitals Group     BP 04/21/18 2036 (!) 149/66     Pulse Rate 04/21/18 2036 (!) 52     Resp 04/21/18 2036 18     Temp 04/21/18 2036 (!) 97.2 F (36.2 C)     Temp Source 04/21/18 2036 Oral     SpO2 04/21/18 2036 93 %     Weight 04/21/18 2035 106 lb (48.1 kg)     Height 04/21/18 2035 5' (1.524 m)     Head Circumference --      Peak Flow --      Pain  Score 04/21/18 2035 0     Pain Loc --      Pain Edu? --      Excl. in GC? --     Constitutional: Alert and oriented. Well appearing and in no acute distress. Eyes: Conjunctivae are normal Head: Atraumatic HEENT: No congestion/rhinnorhea. Mucous membranes are moist.  Oropharynx non-erythematous Neck:   Nontender with no meningismus, no masses, no stridor Cardiovascular: Normal rate, regular rhythm. Grossly normal heart sounds.  Good peripheral circulation. Respiratory: Normal respiratory effort.  No retractions. Lungs CTAB. Abdominal: Soft and nontender. No distention. No guarding no rebound Back:  There is no focal tenderness or step off.  there is no midline tenderness there are no lesions noted. there is no CVA tenderness Rectal exam: Guaiac positive bloody stool present Musculoskeletal: No lower extremity tenderness, no upper extremity tenderness. No joint effusions, no DVT signs strong distal pulses no edema Neurologic:  Normal speech and language. No gross focal neurologic deficits are appreciated.  Skin:  Skin is warm, dry and intact. No rash noted. Psychiatric: Mood and affect are normal. Speech and behavior are normal.  ____________________________________________   LABS (all labs ordered are listed, but only abnormal results are displayed)  Labs Reviewed  CBC WITH DIFFERENTIAL/PLATELET - Abnormal; Notable for the following components:      Result Value   RBC 3.47 (*)    Hemoglobin 11.3 (*)    HCT 33.5 (*)    RDW 15.2 (*)    All other components within normal limits  PROTIME-INR  COMPREHENSIVE METABOLIC PANEL  CBG MONITORING, ED  TYPE AND SCREEN    Pertinent labs  results that were available during my care of the patient were reviewed by me and considered in my medical decision making (see chart for details). ____________________________________________  EKG  I personally interpreted any EKGs ordered by me or  triage  ____________________________________________  RADIOLOGY  Pertinent labs & imaging results that were available during my care of the patient were reviewed by me and considered in my medical decision making (see chart for details). If possible,  patient and/or family made aware of any abnormal findings.  No results found. ____________________________________________    PROCEDURES  Procedure(s) performed: None  Procedures  Critical Care performed: CRITICAL CARE Performed by: Jeanmarie Plant   Total critical care time: 35 minutes  Critical care time was exclusive of separately billable procedures and treating other patients.  Critical care was necessary to treat or prevent imminent or life-threatening deterioration.  Critical care was time spent personally by me on the following activities: development of treatment plan with patient and/or surrogate as well as nursing, discussions with consultants, evaluation of patient's response to treatment, examination of patient, obtaining history from patient or surrogate, ordering and performing treatments and interventions, ordering and review of laboratory studies, ordering and review of radiographic studies, pulse oximetry and re-evaluation of patient's condition.   ____________________________________________   INITIAL IMPRESSION / ASSESSMENT AND PLAN / ED COURSE  Pertinent labs & imaging results that were available during my care of the patient were reviewed by me and considered in my medical decision making (see chart for details).  Patient here with a GI bleed on aspirin, vital signs and hemoglobin are reassuring but she did pass a pretty large amount of blood medially prior to coming in, she likely will need admission and observation.    ____________________________________________   FINAL CLINICAL IMPRESSION(S) / ED DIAGNOSES  Final diagnoses:  None      This chart was dictated using voice recognition software.   Despite best efforts to proofread,  errors can occur which can change meaning.      Jeanmarie Plant, MD 04/21/18 2133

## 2018-04-22 ENCOUNTER — Inpatient Hospital Stay: Payer: Medicare Other

## 2018-04-22 ENCOUNTER — Encounter
Admission: EM | Disposition: A | Discharge status: Hospice | Payer: Self-pay | Source: Home / Self Care | Attending: Internal Medicine

## 2018-04-22 ENCOUNTER — Other Ambulatory Visit: Payer: Self-pay

## 2018-04-22 DIAGNOSIS — K922 Gastrointestinal hemorrhage, unspecified: Secondary | ICD-10-CM

## 2018-04-22 DIAGNOSIS — I2510 Atherosclerotic heart disease of native coronary artery without angina pectoris: Secondary | ICD-10-CM

## 2018-04-22 DIAGNOSIS — K625 Hemorrhage of anus and rectum: Secondary | ICD-10-CM

## 2018-04-22 DIAGNOSIS — D500 Iron deficiency anemia secondary to blood loss (chronic): Secondary | ICD-10-CM

## 2018-04-22 DIAGNOSIS — I10 Essential (primary) hypertension: Secondary | ICD-10-CM

## 2018-04-22 HISTORY — PX: VISCERAL ARTERY INTERVENTION: CATH118277

## 2018-04-22 LAB — BASIC METABOLIC PANEL
Anion gap: 8 (ref 5–15)
BUN: 21 mg/dL — AB (ref 6–20)
CHLORIDE: 109 mmol/L (ref 101–111)
CO2: 26 mmol/L (ref 22–32)
CREATININE: 0.84 mg/dL (ref 0.44–1.00)
Calcium: 8.5 mg/dL — ABNORMAL LOW (ref 8.9–10.3)
GFR calc Af Amer: >60 mL/min (ref >60)
GFR calc non Af Amer: 57 mL/min — ABNORMAL LOW (ref >60)
Glucose, Bld: 102 mg/dL — ABNORMAL HIGH (ref 65–99)
POTASSIUM: 3.6 mmol/L (ref 3.5–5.1)
Sodium: 143 mmol/L (ref 135–145)

## 2018-04-22 LAB — CBC
HEMATOCRIT: 31.4 % — AB (ref 35.0–47.0)
Hemoglobin: 10.6 g/dL — ABNORMAL LOW (ref 12.0–16.0)
MCH: 32.4 pg (ref 26.0–34.0)
MCHC: 33.8 g/dL (ref 32.0–36.0)
MCV: 95.7 fL (ref 80.0–100.0)
PLATELETS: 194 10*3/uL (ref 150–440)
RBC: 3.28 MIL/uL — AB (ref 3.80–5.20)
RDW: 15.2 % — ABNORMAL HIGH (ref 11.5–14.5)
WBC: 8.3 10*3/uL (ref 3.6–11.0)

## 2018-04-22 LAB — TROPONIN I: Troponin I: <0.03 ng/mL (ref <0.03)

## 2018-04-22 LAB — HEMOGLOBIN
HEMOGLOBIN: 10.5 g/dL — AB (ref 12.0–16.0)
HEMOGLOBIN: 12.3 g/dL (ref 12.0–16.0)
Hemoglobin: 9.1 g/dL — ABNORMAL LOW (ref 12.0–16.0)

## 2018-04-22 LAB — GLUCOSE, CAPILLARY
GLUCOSE-CAPILLARY: 95 mg/dL (ref 65–99)
GLUCOSE-CAPILLARY: 120 mg/dL — AB (ref 65–99)

## 2018-04-22 LAB — PREPARE RBC (CROSSMATCH)

## 2018-04-22 LAB — MRSA PCR SCREENING: MRSA BY PCR: NEGATIVE

## 2018-04-22 LAB — HEMOGLOBIN AND HEMATOCRIT, BLOOD
HCT: 28.1 % — ABNORMAL LOW (ref 35.0–47.0)
Hemoglobin: 9.4 g/dL — ABNORMAL LOW (ref 12.0–16.0)

## 2018-04-22 SURGERY — VISCERAL ARTERY INTERVENTION
Anesthesia: Moderate Sedation

## 2018-04-22 MED ORDER — IOPAMIDOL (ISOVUE-300) INJECTION 61%
INTRAVENOUS | Status: DC | PRN
  Administered 2018-04-22: 45 mL via INTRAVENOUS

## 2018-04-22 MED ORDER — TECHNETIUM TC 99M-LABELED RED BLOOD CELLS IV KIT
24.0000 | PACK | Freq: Once | INTRAVENOUS | Status: AC | PRN
  Administered 2018-04-22: 23.63 via INTRAVENOUS

## 2018-04-22 MED ORDER — ROSUVASTATIN CALCIUM 10 MG PO TABS
5.0000 mg | ORAL_TABLET | Freq: Every day | ORAL | Status: DC
  Administered 2018-04-23 – 2018-04-26 (×4): 5 mg via ORAL
  Filled 2018-04-22 (×4): qty 1

## 2018-04-22 MED ORDER — SODIUM CHLORIDE 0.9 % IV SOLN
Freq: Once | INTRAVENOUS | Status: AC
  Administered 2018-04-22: 10:00:00 via INTRAVENOUS

## 2018-04-22 MED ORDER — MIDAZOLAM HCL 2 MG/2ML IJ SOLN
INTRAMUSCULAR | Status: DC | PRN
  Administered 2018-04-22: 1 mg via INTRAVENOUS

## 2018-04-22 MED ORDER — ONDANSETRON HCL 4 MG/2ML IJ SOLN: 4.0000 mg | Freq: Four times a day (QID) | INTRAMUSCULAR | Status: DC | PRN

## 2018-04-22 MED ORDER — ACETAMINOPHEN 325 MG PO TABS: 650.0000 mg | ORAL_TABLET | Freq: Four times a day (QID) | ORAL | Status: DC | PRN

## 2018-04-22 MED ORDER — FENTANYL CITRATE (PF) 100 MCG/2ML IJ SOLN
INTRAMUSCULAR | Status: DC | PRN
  Administered 2018-04-22: 25 ug via INTRAVENOUS

## 2018-04-22 MED ORDER — FENTANYL CITRATE (PF) 100 MCG/2ML IJ SOLN
INTRAMUSCULAR | Status: AC
  Filled 2018-04-22: qty 2

## 2018-04-22 MED ORDER — SODIUM CHLORIDE 0.9 % IV SOLN
INTRAVENOUS | Status: DC
  Administered 2018-04-22: 12:00:00 via INTRAVENOUS

## 2018-04-22 MED ORDER — SODIUM CHLORIDE 0.9 % IV SOLN
Freq: Once | INTRAVENOUS | Status: AC
  Administered 2018-04-22: 15:00:00 via INTRAVENOUS

## 2018-04-22 MED ORDER — CEFAZOLIN SODIUM-DEXTROSE 2-4 GM/100ML-% IV SOLN
2.0000 g | INTRAVENOUS | Status: DC
  Administered 2018-04-22: 2 g via INTRAVENOUS

## 2018-04-22 MED ORDER — CEFAZOLIN SODIUM-DEXTROSE 2-4 GM/100ML-% IV SOLN
INTRAVENOUS | Status: AC
  Filled 2018-04-22: qty 100

## 2018-04-22 MED ORDER — ONDANSETRON HCL 4 MG PO TABS: 4.0000 mg | ORAL_TABLET | Freq: Four times a day (QID) | ORAL | Status: DC | PRN

## 2018-04-22 MED ORDER — CARVEDILOL 3.125 MG PO TABS: 3.1250 mg | ORAL_TABLET | Freq: Two times a day (BID) | ORAL | Status: DC

## 2018-04-22 MED ORDER — ISOSORBIDE MONONITRATE ER 30 MG PO TB24: 30.0000 mg | ORAL_TABLET | Freq: Every day | ORAL | Status: DC

## 2018-04-22 MED ORDER — LIDOCAINE-EPINEPHRINE (PF) 1 %-1:200000 IJ SOLN
INTRAMUSCULAR | Status: AC
  Filled 2018-04-22: qty 30

## 2018-04-22 MED ORDER — SODIUM CHLORIDE 0.9 % IV SOLN
INTRAVENOUS | Status: DC
  Administered 2018-04-22 – 2018-04-23 (×2): via INTRAVENOUS

## 2018-04-22 MED ORDER — DIPHENHYDRAMINE HCL 25 MG PO CAPS
25.0000 mg | ORAL_CAPSULE | Freq: Every evening | ORAL | Status: DC | PRN
  Administered 2018-04-22: 25 mg via ORAL
  Filled 2018-04-22 (×2): qty 1

## 2018-04-22 MED ORDER — MIDAZOLAM HCL 5 MG/5ML IJ SOLN
INTRAMUSCULAR | Status: AC
  Filled 2018-04-22: qty 5

## 2018-04-22 MED ORDER — AMIODARONE HCL 200 MG PO TABS
200.0000 mg | ORAL_TABLET | Freq: Every day | ORAL | Status: DC
  Administered 2018-04-23 – 2018-04-26 (×4): 200 mg via ORAL
  Filled 2018-04-22 (×4): qty 1

## 2018-04-22 MED ORDER — ACETAMINOPHEN 650 MG RE SUPP: 650.0000 mg | Freq: Four times a day (QID) | RECTAL | Status: DC | PRN

## 2018-04-22 MED ORDER — SODIUM CHLORIDE 0.9 % IV BOLUS
500.0000 mL | Freq: Once | INTRAVENOUS | Status: AC
  Administered 2018-04-22: 07:00:00 500 mL via INTRAVENOUS

## 2018-04-22 MED ORDER — SODIUM CHLORIDE 0.9 % IV BOLUS
500.0000 mL | Freq: Once | INTRAVENOUS | Status: AC
  Administered 2018-04-22: 500 mL via INTRAVENOUS

## 2018-04-22 MED ORDER — ASPIRIN EC 81 MG PO TBEC: 81.0000 mg | DELAYED_RELEASE_TABLET | Freq: Every day | ORAL | Status: DC

## 2018-04-22 MED ORDER — HEPARIN (PORCINE) IN NACL 1000-0.9 UT/500ML-% IV SOLN
INTRAVENOUS | Status: AC
  Filled 2018-04-22: qty 1000

## 2018-04-22 SURGICAL SUPPLY — 15 items
BLOCK BEAD 500-700 (Vascular Products) ×2 IMPLANT
CATH MICROCATH PRGRT 2.8F 110 (CATHETERS) IMPLANT
CATH PIG 70CM (CATHETERS) ×2 IMPLANT
CATH VS15FR (CATHETERS) ×2 IMPLANT
COVER PROBE U/S 5X48 (MISCELLANEOUS) ×2 IMPLANT
DEVICE STARCLOSE SE CLOSURE (Vascular Products) ×2 IMPLANT
DEVICE TORQUE .025-.038 (MISCELLANEOUS) ×2 IMPLANT
GUIDEWIRE ANGLED .035 180CM (WIRE) ×2 IMPLANT
MICROCATH PROGREAT 2.8F 110 CM (CATHETERS) ×3
SHEATH BRITE TIP 5FRX11 (SHEATH) ×2 IMPLANT
STOPCOCK ROT 3 WAY (MISCELLANEOUS) ×2
STOPCOCK ROT 3WAY (MISCELLANEOUS) IMPLANT
SYR MEDRAD MARK V 150ML (SYRINGE) ×2 IMPLANT
TUBING CONTRAST HIGH PRESS 72 (TUBING) ×2 IMPLANT
WIRE J 3MM .035X145CM (WIRE) ×2 IMPLANT

## 2018-04-22 NOTE — Op Note (Signed)
[facility] Percutaneous Study/Intervention Procedural Note     Surgeon(s): [employer]  Assistants: none  Pre-operative Diagnosis: 1. Lower GI bleed   Post-operative diagnosis: Same  Procedure(s) Performed: 1. Ultrasound guidance for vascular access right femoral artery 2. Catheter placement into splenic flexure branch of the IMA and sigmoidal branch of the IMA from right femoral approach 3. Aortogram and selective angiogram of the IMA including selective injections of a splenic flexure branch and a sigmoidal branch 4. Microbead embolization of the splenic flexure branch of the IMA with 1.5 cc of 500-700  polyvinyl alcohol beads.  5.  Microbead embolization of the sigmoidal branch of the IMA with 1 cc of 500 to 700  polyvinyl alcohol beads 6. StarClose closure device right femoral artery  Anesthesia: Moderate conscious sedation for approximately 25 minutes using 1 mg of Versed and 25 Mcg of Fentanyl  EBL: 10 cc  Fluoro Time: 1.7 minutes  Contrast: 45 cc  Indications: Patient is a 84 y.o.female with brisk lower GI bleeding with resultant anemia. The patient has a nuclear medicine study showing active bleeding in the left colon. The patient is brought in for angiography for further evaluation and potential treatment. Risks and benefits are discussed and informed consent is obtained  Procedure: The patient was identified and appropriate procedural time out was performed. The patient was then placed supine on the table and prepped and draped in the usual sterile fashion.Moderate conscious sedation was administered during a face to face encounter with the patient throughout the procedure with my supervision of the RN administering medicines and monitoring the patient's vital signs, pulse oximetry, telemetry and mental status throughout from the start of the procedure  until the patient was taken to the recovery room.  Ultrasound was used to evaluate the right common femoral artery. It was patent . A digital ultrasound image was acquired. A Seldinger needle was used to access the right common femoral artery under direct ultrasound guidance and a permanent image was performed. A 0.035 J wire was advanced without resistance and a 5Fr sheath was placed. Pigtail catheter was placed into the aorta and an AP aortogram was performed. This demonstrated calcified vessels but flow without obvious stenosis in the aorta, iliac arteries, renal arteries, celiac artery, SMA, and IMA.  We transitioned to the somewhat steep RAO projection to image the IMA origin and this showed this reasonably well.  There was at least a moderate stenosis of the IMA origin.  Using a VS 1 catheter, we were able to easily cannulate the IMA.  Selective imaging was then performed.  This demonstrated active blush in the left colon that seem to be primarily originating in the proximal left colon identified best on injection of the splenic flexure branch.  There also appeared to be some pooling of blood in the more distal left colon down near the sigmoid colon.  Difficult to discern whether or not this was all coming from the more proximal site and only pulling intraluminally or also having bleeding in the more distal left colon. Based on her continued bleeding and the nuclear medicine study I elected to treat this area with embolization. I initially advanced the Pro-Great microcatheter out the IMA and then up into a superior going branch.  This branch bifurcated in the more superior going branch went directly to the splenic flexure.  I advanced the prograde microcatheter out the splenic flexure branch and performed imaging.  At this point, I instilled approximately 1.5 cc of 500  to 700 m polyvinyl alcohol beads in this location. Angiogram following this showed the main vessels to be open with less brisk filling. I  then pulled back and cannulated the downward going sigmoid branch with the Pro-great microcatheter without difficulty.  I advanced the catheter well out the sigmoid branch going to the distal left colon and proximal sigmoid.  Selective imaging was performed which showed brisk flow although it was difficult to discern if there was any active blush.  I elected to instill another 1 cc of 500 to 700 m polyvinyl alcohol beads were deployed in the sigmoidal branch. Again, completion angiogram showed the main vessels to be open with less brisk filling. I elected to terminate the procedure. The diagnostic catheter was removed. StarClose closure device was deployed in usual fashion with excellent hemostatic result. The patient was taken to the recovery room in stable condition having tolerated the procedure well.     Findings:Active blush in the left colon that seem to be primarily originating in the proximal left colon identified best on injection of the splenic flexure branch.  There also appeared to be some pooling of blood in the more distal left colon down near the sigmoid colon.  Difficult to discern whether or not this was all coming from the more proximal site and only pulling intraluminally or also having bleeding in the more distal left colon.  Disposition: Patient was taken to the recovery room in stable condition having tolerated the procedure well.  Complications: None  Festus Barren 04/22/2018 12:47 PM   This note was created with Dragon Medical transcription system. Any errors in dictation are purely unintentional.

## 2018-04-22 NOTE — Progress Notes (Signed)
EKG stat, H & H. Were placed per MD's order. Blood pressure increased. Pt continues to be Alert and oriented, pt stated that her chest pain was better. Troponin orders per MD. Continue to monitor.

## 2018-04-22 NOTE — Progress Notes (Signed)
[facility] - [place] [facility]   PATIENT NAME: Christina [name]    MR#:  [id number]  DATE OF BIRTH:  1920/04/20  SUBJECTIVE:  CHIEF COMPLAINT:   Chief Complaint  Patient presents with  . Rectal Bleeding   - came in with rectal bleeding, had 2 bloody bowel movements this morning and blood pressure dropped.  Patient started complaining of chest pain. -Fluid bolus running, blood pressure is improving  REVIEW OF SYSTEMS:  Review of Systems  Constitutional: Positive for malaise/fatigue. Negative for chills and fever.  HENT: Positive for hearing loss. Negative for congestion, ear discharge and nosebleeds.   Respiratory: Negative for cough, shortness of breath and wheezing.   Cardiovascular: Positive for chest pain. Negative for palpitations.  Gastrointestinal: Positive for blood in stool. Negative for abdominal pain, constipation, diarrhea, nausea and vomiting.  Genitourinary: Negative for dysuria.  Neurological: Positive for dizziness. Negative for focal weakness, seizures, weakness and headaches.    DRUG ALLERGIES:   Allergies  Allergen Reactions  . Bee Venom Rash  . Plavix [Clopidogrel] Rash  . Atorvastatin Other (See Comments)    Reaction:  Blisters on feet   . Diltiazem Hcl Other (See Comments)    Reaction:  Unknown   . Dye Fdc Red [Red Dye] Other (See Comments)    Reaction:  Unknown   . Gemfibrozil Other (See Comments)    Reaction:  Unknown   . Niacin Other (See Comments)    Reaction:  Unknown   . Sulfasalazine Rash    VITALS:  Blood pressure (!) 116/56, pulse 61, temperature 98.2 F (36.8 C), temperature source Oral, resp. rate 17, height  (1.499 m), weight 46.9 kg (103 lb 8 oz), SpO2 100 %.  PHYSICAL EXAMINATION:  Physical Exam   GENERAL:  84 y.o.-year-old patient lying in the bed with no acute distress.  EYES: Pupils equal, round, reactive to light and accommodation. No scleral icterus. Extraocular muscles intact.  HEENT: Head  atraumatic, normocephalic. Oropharynx and nasopharynx clear.  NECK:  Supple, no jugular venous distention. No thyroid enlargement, no tenderness.  LUNGS: Normal breath sounds bilaterally, no wheezing, rales,rhonchi or crepitation. No use of accessory muscles of respiration. Decreased bibasilar breath sounds CARDIOVASCULAR: S1, S2 normal. No  rubs, or gallops. 3/6 systolic murmur present ABDOMEN: Soft, nontender, nondistended. Bowel sounds present. No organomegaly or mass.  EXTREMITIES: No pedal edema, cyanosis, or clubbing.  NEUROLOGIC: Cranial nerves II through XII are intact. Muscle strength 5/5 in all extremities. Sensation intact. Gait not checked. Global weakness noted. PSYCHIATRIC: The patient is alert and oriented x 3.  SKIN: No obvious rash, lesion, or ulcer.    LABORATORY PANEL:   CBC Recent Labs  Lab 04/22/18 0406 04/22/18 0713  WBC 8.3  --   HGB 10.6* 9.4*  HCT 31.4* 28.1*  PLT 194  --    ------------------------------------------------------------------------------------------------------------------  Chemistries  Recent Labs  Lab 04/21/18 2045 04/22/18 0406  NA 141 143  K 3.3* 3.6  CL 110 109  CO2 25 26  GLUCOSE 139* 102*  BUN 21* 21*  CREATININE 0.98 0.84  CALCIUM 9.0 8.5*  AST 21  --   ALT 16  --   ALKPHOS 54  --   BILITOT 0.5  --    ------------------------------------------------------------------------------------------------------------------  Cardiac Enzymes Recent Labs  Lab 04/22/18 0740  TROPONINI <0.03   ------------------------------------------------------------------------------------------------------------------  RADIOLOGY:  No results found.  EKG:   Orders placed or performed during the hospital encounter of 04/21/18  . EKG 12-Lead  .  EKG 12-Lead  . EKG 12-Lead  . EKG 12-Lead  . EKG 12-Lead  . EKG 12-Lead    ASSESSMENT AND PLAN:   84 year old female with past medical history significant for CAD status post PCI, carotid  stenosis, hypertension, ischemic cardiomyopathy EF of 25%, osteoporosis, paroxysmal atrial fibrillation not on anticoagulation who states that home with her daughter was brought in secondary to rectal bleed,  1.  GI bleed-likely lower GI, could be diverticular bleed - but 2 large bloody stools this AM- ordered bleeding scan stat - continue hb check and transfusion as needed -GI has been consulted Keep n.p.o.  2.  Chest pain-secondary to drop in blood pressure from the bleeding -We will avoid nitro as it can cause further hypotension.  EKG with ST depressions in lateral leads which is chronic.  Troponin is negative.  Monitor at this time  3.  Hypotension-likely hemorrhagic shock. -Continue to monitor hemoglobin every 8 hours. -GI bleeding scan.  Transfuse if hemoglobin is less than 7  4.  CAD-hold aspirin due to GI bleed.  Blood pressure medications on hold due to bleeding.  5.  A. fib-continue amiodarone.  Hold Coreg.  Hold aspirin for now  6.  DVT prophylaxis-teds and SCDs only  Daughter has been updated over the phone.    All the records are reviewed and case discussed with Care Management/Social Workerr. Management plans discussed with the patient, family and they are in agreement.  CODE STATUS: DNR  TOTAL CRITICAL CARE TIME SPENT IN TAKING CARE OF THIS PATIENT: 38 minutes.   POSSIBLE D/C IN 2 DAYS, DEPENDING ON CLINICAL CONDITION.   Enid Baas M.D on 04/22/2018 at 9:40 AM  Between 7am to 6pm - Pager - [phone]  After 6pm go to [email or web address] - Social research officer, government  [facility]  Office  [phone]  CC: Primary care physician; Karie Schwalbe, MD

## 2018-04-22 NOTE — Progress Notes (Signed)
Discussed with daughter this morning about patient's rapid response, explained that if this were to happen again-we will move her to ICU. Patient went for bleeding scan, had a huge bloody bowel movement with drop in blood pressure again. -2 packs of RBC stat ordered.  Discussed with intensivist, will transfer to ICU -Bleeding scan is positive, vascular has been notified.

## 2018-04-22 NOTE — Plan of Care (Signed)
  Problem: Education: Goal: Ability to identify signs and symptoms of gastrointestinal bleeding will improve Outcome: Progressing   Problem: Bowel/Gastric: Goal: Will show no signs and symptoms of gastrointestinal bleeding Outcome: Progressing   Problem: Fluid Volume: Goal: Will show no signs and symptoms of excessive bleeding Outcome: Progressing   Problem: Clinical Measurements: Goal: Complications related to the disease process, condition or treatment will be avoided or minimized Outcome: Progressing   Problem: Health Behavior/Discharge Planning: Goal: Ability to manage health-related needs will improve Outcome: Progressing   Problem: Clinical Measurements: Goal: Ability to maintain clinical measurements within normal limits will improve Outcome: Progressing Goal: Will remain free from infection Outcome: Progressing   Problem: Activity: Goal: Risk for activity intolerance will decrease Outcome: Progressing   Problem: Nutrition: Goal: Adequate nutrition will be maintained Outcome: Progressing   Problem: Coping: Goal: Level of anxiety will decrease Outcome: Progressing   Problem: Elimination: Goal: Will not experience complications related to bowel motility Outcome: Progressing   Problem: Pain Managment: Goal: General experience of comfort will improve Outcome: Progressing   Problem: Safety: Goal: Ability to remain free from injury will improve Outcome: Progressing

## 2018-04-22 NOTE — Progress Notes (Signed)
Responded to Rapid Response page. Charline Bills, RN ICU Charge RN also arrived. Bolus initiated.MD had been contacted. Pt alert, oriented.

## 2018-04-22 NOTE — Progress Notes (Addendum)
Nuclear medicine tech verbalized that pt's BP dropped within from 129/53 to 98/41. HR remained in the 60's. On assessment pt A&Ox4, felt tired.Pt denied chest pain. Notified Dr Nemiah Commander of the above. Bolus ordered and initiated.

## 2018-04-22 NOTE — Progress Notes (Signed)
Pt is off the floor for bleeding scan. VSS. Per Dr Nemiah Commander pt is ok to go for scan without RN. Spoke to [facility] nuclear medicine, BP is checked during scan qx76min, keep pt in bedrest , bolus still infusing.

## 2018-04-22 NOTE — Progress Notes (Signed)
Pt daughter spoke with Dr Wyn Quaker. Consent for embolization of bleeding in colon given. Also consent for blood products given.

## 2018-04-22 NOTE — Consult Note (Signed)
[facility] Vascular Consult Note  MRN : [id number]  Christina LACOMB is a 84 y.o. (1920-11-29) female who presents with chief complaint of  Chief Complaint  Patient presents with  . Rectal Bleeding  .  History of Present Illness: I am asked by Dr. Nemiah Commander to see the patient regarding a GI bleed.  She provides little history at this point and has developed hypotension and brisk lower GI bleeding.  The bleeding started yesterday.  This has been quite brisk without a clear cause or inciting event.  No previous episodes similar to this.  In discussions with the daughter, she appears to have no significant pain.  No fevers or chills.  As part of her work-up, she underwent a nuclear medicine scan which I have independently reviewed.  This demonstrates active bleeding in the left colon and to my review the area appears to be from the splenic flexure although the more distal left colon also lights up quickly.  Current Facility-Administered Medications  Medication Dose Route Frequency Provider Last Rate Last Dose  . [MAR Hold] acetaminophen (TYLENOL) tablet 650 mg  650 mg Oral Q6H PRN Annice Needy, MD       Or  . Mitzi Hansen Hold] acetaminophen (TYLENOL) suppository 650 mg  650 mg Rectal Q6H PRN Annice Needy, MD      . Mitzi Hansen Hold] amiodarone (PACERONE) tablet 200 mg  200 mg Oral Daily Zakeya Junker, Marlow Baars, MD      . ceFAZolin (ANCEF) 2-4 GM/100ML-% IVPB           . [MAR Hold] diphenhydrAMINE (BENADRYL) capsule 25 mg  25 mg Oral QHS PRN Annice Needy, MD   25 mg at 04/22/18 0105  . [MAR Hold] ondansetron (ZOFRAN) tablet 4 mg  4 mg Oral Q6H PRN Annice Needy, MD       Or  . Mitzi Hansen Hold] ondansetron (ZOFRAN) injection 4 mg  4 mg Intravenous Q6H PRN Annice Needy, MD      . Mitzi Hansen Hold] rosuvastatin (CRESTOR) tablet 5 mg  5 mg Oral Daily Jakobie Henslee, Marlow Baars, MD      . sodium chloride 0.9 % bolus 500 mL  500 mL Intravenous Once Enid Baas, MD 125 mL/hr at 04/22/18 0940 500 mL at 04/22/18 0940    Past  Medical History:  Diagnosis Date  . CAD (coronary artery disease)    a. S/P previous MI;  b. 04/2007 Cath/PCI: Taxus DES' to mid/distal RCA and RPDA as well as Ramus;  c. myoview 8/09: EF 78%, normal perfusion; d. 12/2012 Low risk MV; e. 08/2017 NSTEMI->Med Rx.  . Carotid stenosis    Bilateral, mild to moderate  . Chronic back pain   . Chronic shoulder pain   . HFrEF (heart failure with reduced ejection fraction) (HCC)    a. 08/2017 Echo: EF 20-25%, mid-apicalanteroseptal, ant, antlat, and apical sev HK. Mild MR, mildly dil LA, nl RV fxn.  . Hyperlipidemia    Intolerant of many statins  . Hypertension   . Ischemic cardiomyopathy    a. 08/2017 Echo: EF 20-25%, mid-apicalanteroseptal, ant, antlat, and apical sev HK.  . Mild mitral and aortic regurgitation    a. 11/2011 Echo: EF 55-65%, No RWMA, Gr 1 DD, Mild AI/MR; b. 08/2017 Echo: Mild MR.  Marland Kitchen Neuropathy   . Osteoarthritis   . Osteoporosis   . PAF (paroxysmal atrial fibrillation) (HCC)    a. 08/2017 - noted @ time of NSTEMI. CHA2DS2VASc = 5-->ASA only due  to age and h/o falls;  c. On Amio.  Marland Kitchen Pelvic fracture (HCC) 6/16  . Peripheral vascular disease [facility])     Past Surgical History:  Procedure Laterality Date  . 2 broken arms     . ABDOMINAL HYSTERECTOMY    . ADENOSINE MYOVIEW  01/2006   No ischemia, EF 70%  . BREAST BIOPSY     Bilateral  . CAROTID U/S  10/2006   0-39% bilaterally  . CATARACT EXTRACTION  09/2006   Right  . CORONARY ANGIOPLASTY  12/2002   Medical rx only  . CORONARY ANGIOPLASTY  01/2006   LAD/PAD disease  . CORONARY ANGIOPLASTY  03/2007   3 vessel disease  . CORONARY STENT PLACEMENT  2002   MI, [facility]  . CORONARY STENT PLACEMENT  04/2007   RCA/LCX multiple, [facility]  . CP ADMIT  09/2005   Stress myoview negative  . FEMORAL HERNIA REPAIR  05/2007   Incarcerated, right  . FRACTURE SURGERY  03/2009   Left leg  . hip fracture repair Bilateral   . INTRAMEDULLARY (IM) NAIL INTERTROCHANTERIC Right 05/18/2016    Procedure: INTRAMEDULLARY (IM) NAIL INTERTROCHANTRIC;  Surgeon: [name], MD;  Location: [facility] ORS;  Service: Orthopedics;  Laterality: Right;  . INTRAMEDULLARY (IM) NAIL INTERTROCHANTERIC Left 06/18/2017   Procedure: INTRAMEDULLARY (IM) NAIL INTERTROCHANTRIC;  Surgeon: Deeann Saint, MD;  Location: [facility] ORS;  Service: Orthopedics;  Laterality: Left;  . NM MYOVIEW LTD  11/2004   Stress, negative; NL EF  . two broken wrist on left arm,    . VESICOVAGINAL FISTULA CLOSURE W/ TAH      Social History Social History   Tobacco Use  . Smoking status: Never Smoker  . Smokeless tobacco: Never Used  Substance Use Topics  . Alcohol use: No  . Drug use: No    Family History Family History  Problem Relation Age of Onset  . Heart disease Mother   . Pulmonary embolism Father   . Heart disease Sister   . Cancer Brother   . Cancer Sister    Allergies  Allergen Reactions  . Bee Venom Rash  . Plavix [Clopidogrel] Rash  . Atorvastatin Other (See Comments)    Reaction:  Blisters on feet   . Diltiazem Hcl Other (See Comments)    Reaction:  Unknown   . Dye Fdc Red [Red Dye] Other (See Comments)    Reaction:  Unknown   . Gemfibrozil Other (See Comments)    Reaction:  Unknown   . Niacin Other (See Comments)    Reaction:  Unknown   . Sulfasalazine Rash     REVIEW OF SYSTEMS (Negative unless checked) Difficult to obtain due to the severity of illness and the patient being unable to provide reliable history at this time  Physical Examination  Vitals:   04/22/18 1225 04/22/18 1229 04/22/18 1234 04/22/18 1238  BP:      Pulse:      Resp:      Temp:      TempSrc:      SpO2: 100% 98% 96% 95%  Weight:      Height:       Body mass index is 22.31 kg/m. Gen: Frail, elderly white female who is somewhat somnolent at this time Head: [place]/AT, No temporalis wasting.  Ear/Nose/Throat: Hearing grossly intact, nares w/o erythema or drainage, oropharynx w/o Erythema/Exudate Eyes: Sclera  non-icteric, conjunctiva clear Neck: Trachea midline.  No JVD.  Pulmonary:  Good air movement, respirations not labored, equal bilaterally.  Cardiac:  RRR, normal S1, S2. Vascular:  Vessel Right Left  Radial Palpable Palpable                                   Gastrointestinal: soft, mildly distended without obvious tenderness Musculoskeletal:  Extremities without ischemic changes.  No deformity or atrophy. No edema. Neurologic: Difficult to discern given the severity of illness but no obvious focal deficits noted Psychiatric: Somnolent, unable to provide accurate history at this time Dermatologic: No rashes or ulcers noted.  No cellulitis or open wounds.       CBC Lab Results  Component Value Date   WBC 8.3 04/22/2018   HGB 9.1 (L) 04/22/2018   HCT 28.1 (L) 04/22/2018   MCV 95.7 04/22/2018   PLT 194 04/22/2018    BMET    Component Value Date/Time   NA 143 04/22/2018 0406   NA 141 05/23/2014 1603   K 3.6 04/22/2018 0406   K 3.8 05/23/2014 1603   CL 109 04/22/2018 0406   CL 109 (H) 05/23/2014 1603   CO2 26 04/22/2018 0406   CO2 26 05/23/2014 1603   GLUCOSE 102 (H) 04/22/2018 0406   GLUCOSE 165 (H) 05/23/2014 1603   BUN 21 (H) 04/22/2018 0406   BUN 26 (H) 05/23/2014 1603   CREATININE 0.84 04/22/2018 0406   CREATININE 1.02 05/23/2014 1603   CREATININE 0.98 06/07/2011 1015   CALCIUM 8.5 (L) 04/22/2018 0406   CALCIUM 8.9 05/23/2014 1603   GFRNONAA 57 (L) 04/22/2018 0406   GFRNONAA 48 (L) 05/23/2014 1603   GFRAA >60 04/22/2018 0406   GFRAA 55 (L) 05/23/2014 1603   Estimated Creatinine Clearance: 26.7 mL/min (by C-G formula based on SCr of 0.84 mg/dL).  COAG Lab Results  Component Value Date   INR 1.05 04/21/2018   INR 1.21 08/25/2017   INR 0.96 06/18/2017    Radiology Nm Gi Blood Loss  Result Date: 04/22/2018 CLINICAL DATA:  Hypotensive, bright red blood per rectum EXAM: NUCLEAR MEDICINE GASTROINTESTINAL BLEEDING SCAN TECHNIQUE: Sequential abdominal  images were obtained following intravenous administration of Tc-4m labeled red blood cells. RADIOPHARMACEUTICALS:  23.63 mCi Tc-50m pertechnetate in-vitro labeled red cells. COMPARISON:  None. FINDINGS: Examination was prematurely terminated given the active bleeding. Normal physiologic biodistribution of the radiotracer is demonstrated throughout the abdomen. Focal progressively increasing radiotracer activity in the mid descending colon which peristalsis into the distal descending colon and sigmoid colon and also retrograde into the splenic flexure. Findings are consistent with active gastrointestinal bleeding likely originating in the mid descending colon. IMPRESSION: Findings are consistent with active gastrointestinal bleeding likely originating in the mid descending colon. Critical Value/emergent results were called by telephone at the time of interpretation on 04/22/2018 at 10:30 am to Dr. Enid Baas , who verbally acknowledged these results. Electronically Signed   By: Elige Ko   On: 04/22/2018 10:30      Assessment/Plan 1.  GI bleed. nuclear medicine scan which I have independently reviewed.  This demonstrates active bleeding in the left colon and to my review the area appears to be from the splenic flexure although the more distal left colon also lights up quickly.  This is a critical and threatening situation particular given her advanced age.  I have discussed the situation with the daughter and if we can avoid surgery with an embolization procedure I think that would be her best chance.  This will be done immediately in the special procedures  area 2.  Acute blood loss anemia.  Most recent hemoglobin is 9 but it is likely lower than that now as bleeding has continued.  Resuscitate with volume and blood products which she is getting.  Embolization to try to stop the bleeding. 3.  Coronary disease and history of arrhythmias.  At high risk of cardiac complications with her acute blood loss  anemia.  Continue to monitor with telemetry. 4.  Hypertension.  At this time, her blood pressure is low from her acute blood loss situation.  May need to hold her antihypertensives.   Festus Barren, MD  04/22/2018 12:56 PM    This note was created with Dragon medical transcription system.  Any error is purely unintentional

## 2018-04-22 NOTE — Progress Notes (Signed)
CH responded to rapid response call and page. CH arrived to Nuclear Medicine and patient was in room with medical staff. CH provided prayer and support from outside the room. CH spoke with staff and CH Linwood went to speak with the patient's daughter and escorted her to ICU waiting room. CH walked with patient and staff as patient was being transported from Nuclear Medicine X-ray room to ICU 17. CH provided an update for patient's daughter and the nurse from patient's previous unit came and spoke with patient's daughter. CH Linwood provided prayer for patient's daughter. CH provided emotional support, calming presence, and prayer. CH's are available for follow-up.

## 2018-04-22 NOTE — Consult Note (Signed)
Reason for Consult:lower intestinal bleeding Referring Physician: Dr. Winfield Cunas is an 84 y.o. female.  HPI: patient is a 84 year old female with a past medical history remarkable for coronary artery disease, carotid stenosis, chronic back pain, heart failure with reduced ejection fraction at 20-25%, hypertension, hyperlipidemia, valvular heart disease, neuropathy, paroxysmal atrial fibrillation, peripheral vascular disease who presented to the emergency department for rectal bleeding. She had an episode of bright red blood per and has been having some spotting on her stool over the past several days. On admission she had a large volume of bleeding.she denied any significant abdominal pain, initially was admitted to the floor then while getting a bleeding scan she had a large volume of blood bowel movement with hypotension, was subsequently transferred to the intensive care unit. Vascular surgery was notified and presently getting an angiogram.  Past Medical History:  Diagnosis Date  . CAD (coronary artery disease)    a. S/P previous MI;  b. 04/2007 Cath/PCI: Taxus DES' to mid/distal RCA and RPDA as well as Ramus;  c. myoview 8/09: EF 78%, normal perfusion; d. 12/2012 Low risk MV; e. 08/2017 NSTEMI->Med Rx.  . Carotid stenosis    Bilateral, mild to moderate  . Chronic back pain   . Chronic shoulder pain   . HFrEF (heart failure with reduced ejection fraction) ([place])    a. 08/2017 Echo: EF 20-25%, mid-apicalanteroseptal, ant, antlat, and apical sev HK. Mild MR, mildly dil LA, nl RV fxn.  . Hyperlipidemia    Intolerant of many statins  . Hypertension   . Ischemic cardiomyopathy    a. 08/2017 Echo: EF 20-25%, mid-apicalanteroseptal, ant, antlat, and apical sev HK.  . Mild mitral and aortic regurgitation    a. 11/2011 Echo: EF 55-65%, No RWMA, Gr 1 DD, Mild AI/MR; b. 08/2017 Echo: Mild MR.  Marland Kitchen Neuropathy   . Osteoarthritis   . Osteoporosis   . PAF (paroxysmal atrial fibrillation) ([place])    a. 08/2017 - noted @ time of NSTEMI. CHA2DS2VASc = 5-->ASA only due to age and h/o falls;  c. On Amio.  Marland Kitchen Pelvic fracture ([place]) 6/16  . Peripheral vascular disease [facility])     Past Surgical History:  Procedure Laterality Date  . 2 broken arms     . ABDOMINAL HYSTERECTOMY    . ADENOSINE MYOVIEW  01/2006   No ischemia, EF 70%  . BREAST BIOPSY     Bilateral  . CAROTID U/S  10/2006   0-39% bilaterally  . CATARACT EXTRACTION  09/2006   Right  . CORONARY ANGIOPLASTY  12/2002   Medical rx only  . CORONARY ANGIOPLASTY  01/2006   LAD/PAD disease  . CORONARY ANGIOPLASTY  03/2007   3 vessel disease  . CORONARY STENT PLACEMENT  2002   MI, [facility]  . CORONARY STENT PLACEMENT  04/2007   RCA/LCX multiple, [facility]  . CP ADMIT  09/2005   Stress myoview negative  . FEMORAL HERNIA REPAIR  05/2007   Incarcerated, right  . FRACTURE SURGERY  03/2009   Left leg  . hip fracture repair Bilateral   . INTRAMEDULLARY (IM) NAIL INTERTROCHANTERIC Right 05/18/2016   Procedure: INTRAMEDULLARY (IM) NAIL INTERTROCHANTRIC;  Surgeon: Thornton Park, MD;  Location: [facility] ORS;  Service: Orthopedics;  Laterality: Right;  . INTRAMEDULLARY (IM) NAIL INTERTROCHANTERIC Left 06/18/2017   Procedure: INTRAMEDULLARY (IM) NAIL INTERTROCHANTRIC;  Surgeon: Earnestine Leys, MD;  Location: [facility] ORS;  Service: Orthopedics;  Laterality: Left;  . NM MYOVIEW LTD  11/2004   Stress, negative;  NL EF  . two broken wrist on left arm,    . VESICOVAGINAL FISTULA CLOSURE W/ TAH      Family History  Problem Relation Age of Onset  . Heart disease Mother   . Pulmonary embolism Father   . Heart disease Sister   . Cancer Brother   . Cancer Sister     Social History:  reports that she has never smoked. She has never used smokeless tobacco. She reports that she does not drink alcohol or use drugs.  Allergies:  Allergies  Allergen Reactions  . Bee Venom Rash  . Plavix [Clopidogrel] Rash  . Atorvastatin Other (See Comments)    Reaction:   Blisters on feet   . Diltiazem Hcl Other (See Comments)    Reaction:  Unknown   . Dye Fdc Red [Red Dye] Other (See Comments)    Reaction:  Unknown   . Gemfibrozil Other (See Comments)    Reaction:  Unknown   . Niacin Other (See Comments)    Reaction:  Unknown   . Sulfasalazine Rash    Medications: I have reviewed the patient's current medications.  Results for orders placed or performed during the hospital encounter of 04/21/18 (from the past 48 hour(s))  Protime-INR     Status: None   Collection Time: 04/21/18  8:45 PM  Result Value Ref Range   Prothrombin Time 13.6 11.4 - 15.2 seconds   INR 1.05     Comment: Performed at [facility], [address] [place], [place] [address]  CBC with Differential     Status: Abnormal   Collection Time: 04/21/18  8:45 PM  Result Value Ref Range   WBC 5.8 3.6 - 11.0 K/uL   RBC 3.47 (L) 3.80 - 5.20 MIL/uL   Hemoglobin 11.3 (L) 12.0 - 16.0 g/dL   HCT 33.5 (L) 35.0 - 47.0 %   MCV 96.7 80.0 - 100.0 fL   MCH 32.6 26.0 - 34.0 pg   MCHC 33.7 32.0 - 36.0 g/dL   RDW 15.2 (H) 11.5 - 14.5 %   Platelets 221 150 - 440 K/uL   Neutrophils Relative % 73 %   Neutro Abs 4.3 1.4 - 6.5 K/uL   Lymphocytes Relative 17 %   Lymphs Abs 1.0 1.0 - 3.6 K/uL   Monocytes Relative 7 %   Monocytes Absolute 0.4 0.2 - 0.9 K/uL   Eosinophils Relative 2 %   Eosinophils Absolute 0.1 0 - 0.7 K/uL   Basophils Relative 1 %   Basophils Absolute 0.0 0 - 0.1 K/uL    Comment: Performed at [facility], [address] [place], [place] [address]  Comprehensive metabolic panel     Status: Abnormal   Collection Time: 04/21/18  8:45 PM  Result Value Ref Range   Sodium 141 135 - 145 mmol/L   Potassium 3.3 (L) 3.5 - 5.1 mmol/L   Chloride 110 101 - 111 mmol/L   CO2 25 22 - 32 mmol/L   Glucose, Bld 139 (H) 65 - 99 mg/dL   BUN 21 (H) 6 - 20 mg/dL   Creatinine, Ser 0.98 0.44 - 1.00 mg/dL   Calcium 9.0 8.9 - 10.3 mg/dL   Total Protein 6.1 (L) 6.5 - 8.1 g/dL    Albumin 3.3 (L) 3.5 - 5.0 g/dL   AST 21 15 - 41 U/L   ALT 16 14 - 54 U/L   Alkaline Phosphatase 54 38 - 126 U/L   Total Bilirubin 0.5 0.3 - 1.2 mg/dL  GFR calc non Af Amer 47 (L) >60 mL/min   GFR calc Af Amer 55 (L) >60 mL/min    Comment: (NOTE) The eGFR has been calculated using the CKD EPI equation. This calculation has not been validated in all clinical situations. eGFR's persistently <60 mL/min signify possible Chronic Kidney Disease.    Anion gap 6 5 - 15    Comment: Performed at [facility], [address] [place], [place] [address]  Type and screen [place]     Status: None (Preliminary result)   Collection Time: 04/21/18  8:45 PM  Result Value Ref Range   ABO/RH(D) O POS    Antibody Screen NEG    Sample Expiration 04/24/2018    Unit Number [id number]    Blood Component Type RED CELLS,LR    Unit division 00    Status of Unit ISSUED    Transfusion Status OK TO TRANSFUSE    Crossmatch Result      Compatible Performed at [facility], [address] [place], [place] [address]    Unit Number [id number]    Blood Component Type RED CELLS,LR    Unit division 00    Status of Unit ALLOCATED    Transfusion Status OK TO TRANSFUSE    Crossmatch Result Compatible   Basic metabolic panel     Status: Abnormal   Collection Time: 04/22/18  4:06 AM  Result Value Ref Range   Sodium 143 135 - 145 mmol/L   Potassium 3.6 3.5 - 5.1 mmol/L   Chloride 109 101 - 111 mmol/L   CO2 26 22 - 32 mmol/L   Glucose, Bld 102 (H) 65 - 99 mg/dL   BUN 21 (H) 6 - 20 mg/dL   Creatinine, Ser 0.84 0.44 - 1.00 mg/dL   Calcium 8.5 (L) 8.9 - 10.3 mg/dL   GFR calc non Af Amer 57 (L) >60 mL/min   GFR calc Af Amer >60 >60 mL/min    Comment: (NOTE) The eGFR has been calculated using the CKD EPI equation. This calculation has not been validated in all clinical situations. eGFR's persistently <60 mL/min signify possible Chronic Kidney Disease.    Anion  gap 8 5 - 15    Comment: Performed at [facility], [address] [place], [place] [address]  CBC     Status: Abnormal   Collection Time: 04/22/18  4:06 AM  Result Value Ref Range   WBC 8.3 3.6 - 11.0 K/uL   RBC 3.28 (L) 3.80 - 5.20 MIL/uL   Hemoglobin 10.6 (L) 12.0 - 16.0 g/dL   HCT 31.4 (L) 35.0 - 47.0 %   MCV 95.7 80.0 - 100.0 fL   MCH 32.4 26.0 - 34.0 pg   MCHC 33.8 32.0 - 36.0 g/dL   RDW 15.2 (H) 11.5 - 14.5 %   Platelets 194 150 - 440 K/uL    Comment: Performed at [facility], [address] [place], [place] [address]  Hemoglobin and hematocrit, blood     Status: Abnormal   Collection Time: 04/22/18  7:13 AM  Result Value Ref Range   Hemoglobin 9.4 (L) 12.0 - 16.0 g/dL   HCT 28.1 (L) 35.0 - 47.0 %    Comment: Performed at [facility], [place], [place] [address]  Troponin I (q 6hr x 3)     Status: None   Collection Time: 04/22/18  7:40 AM  Result Value Ref Range   Troponin I <0.03 <0.03 ng/mL  Comment: Performed at [facility], [address] [place], [place] [address]  Hemoglobin     Status: Abnormal   Collection Time: 04/22/18 10:20 AM  Result Value Ref Range   Hemoglobin 9.1 (L) 12.0 - 16.0 g/dL    Comment: Performed at [facility], [address] [place], [place] [address]  Glucose, capillary     Status: None   Collection Time: 04/22/18 10:25 AM  Result Value Ref Range   Glucose-Capillary 95 65 - 99 mg/dL  Prepare RBC     Status: None   Collection Time: 04/22/18 10:30 AM  Result Value Ref Range   Order Confirmation      ORDER PROCESSED BY BLOOD BANK Performed at [facility], [place], [place] [address]     Nm Gi Blood Loss  Result Date: 04/22/2018 CLINICAL DATA:  Hypotensive, bright red blood per rectum EXAM: NUCLEAR MEDICINE GASTROINTESTINAL BLEEDING SCAN TECHNIQUE: Sequential abdominal images were obtained following intravenous administration of Tc-13mlabeled  red blood cells. RADIOPHARMACEUTICALS:  23.63 mCi Tc-964mertechnetate in-vitro labeled red cells. COMPARISON:  None. FINDINGS: Examination was prematurely terminated given the active bleeding. Normal physiologic biodistribution of the radiotracer is demonstrated throughout the abdomen. Focal progressively increasing radiotracer activity in the mid descending colon which peristalsis into the distal descending colon and sigmoid colon and also retrograde into the splenic flexure. Findings are consistent with active gastrointestinal bleeding likely originating in the mid descending colon. IMPRESSION: Findings are consistent with active gastrointestinal bleeding likely originating in the mid descending colon. Critical Value/emergent results were called by telephone at the time of interpretation on 04/22/2018 at 10:30 am to Dr. RAGladstone Lighter who verbally acknowledged these results. Electronically Signed   By: HeKathreen Devoid On: 04/22/2018 10:30    ROS Blood pressure (!) 116/47, pulse 64, temperature 97.7 F (36.5 C), temperature source Oral, resp. rate 16, height 4' 11" (1.499 m), weight 110 lb 7.2 oz (50.1 kg), SpO2 99 %. Physical Exam  Patient is awake, alert, elderly female, in mild distress. Vital signs: Please see the above listed vital signs HEENT: Trachea is midline, no thyromegaly noted, poor skin turgor, pale mucosa, no accessory muscle utilization noted Cardiovascular: Regular rate and rhythm, systolic murmur appreciated left parasternal border and aortic post Abdominal exam: Positive bowel sounds, soft exam, some mild tenderness to palpation in particular right lower quadrant Extremities: No clubbing cyanosis or edema noted Neurologic: Patient is responsive, no focal deficits are appreciated   Assessment/Plan:  Lower intestinal bleeding, positive bleeding scan, mid descending colon. Patient presently in interventional radiology getting vascular study performed and possible  embolization.last hemoglobin was measured at 9.1, admission hemoglobin was 11.3.Adequate IV access, transfuse as needed. INR is within normal range and platelet counts normal  Heart failure with reduced ejection fraction. Not in congestive heart failure at this time, stable oxygen saturation  Hypokalemia replaced    JoHermelinda DellenDO   Susan Arana 04/22/2018, 12:19 PM

## 2018-04-22 NOTE — Progress Notes (Signed)
Dr. Wyn Quaker at bedside, speaking with family and pt. Everyone verbalized understanding. New orders received.

## 2018-04-22 NOTE — Progress Notes (Signed)
Pt hypotensive.  Attempting to get blood pressure.  Rapid called.  Placed on oxygen and IV fluids started. Henriette Combs RN

## 2018-04-22 NOTE — Progress Notes (Signed)
Patient's belongings taken to ICU room 17.  Orson Ape, RN

## 2018-04-22 NOTE — Progress Notes (Signed)
Please note patient is currently followed by out patient PALLIATIVE at home. CMRN Collie Siad made aware.  Dayna Barker RN, BSN, [facility] [place], hospital liaison [phone]

## 2018-04-22 NOTE — Progress Notes (Signed)
Pt arrives from bleeding scan with large amount maroon clot stool. Bathed and brief applied. O2 at 2l/[place]. Denies chestpain, Resp nonlabored. Skin dry. Denies abdominal cramping.  Lab here to draw repeat H&H. NS infuses at 14ml/hr for total bolus. Lungs clear and diminished.

## 2018-04-22 NOTE — Progress Notes (Signed)
   04/22/18 0657  Clinical Encounter Type  Visited With Health care provider;Patient not available  Visit Type Code  Spiritual Encounters  Spiritual Needs Prayer   Responded to rapid response.  Maintained pastoral presence and offered silent prayers for patient and care team.

## 2018-04-22 NOTE — Progress Notes (Signed)
To vascular lab for embolization. Report given.

## 2018-04-22 NOTE — Progress Notes (Signed)
Lab called regarding draw for hgb recheck. Pt has good bp after second unit. Continues to have red bloody stool with clots. The size/amount of stools is lessening. Abdominal cramping off and on seems to be lessening also. SR on monitor. Right groin procedure site without problem.

## 2018-04-22 NOTE — Consult Note (Signed)
Wyline Mood , MD [address], [place], [place], [address] [address], [place], [place], [address] Phone: [phone]  Fax: [phone]  Consultation  Referring Provider:  Dr Nemiah Commander Primary Care Physician:  Karie Schwalbe, MD Primary Gastroenterologist:  None          Reason for Consultation:     GI bleed   Date of Admission:  04/21/2018 Date of Consultation:  04/22/2018         HPI:   Christina [name] is a 84 y.o. female admitted with rectal bleeding on 04/21/18 . She is on 81 mg asprin  She had a tagged RBC scan which showed bleeding from the left colon and subsequently was seen by Dr Wyn Quaker who embolized the bleeding vessel with good success. Now in the ICU and seems stable     CBC Latest Ref Rng & Units 04/22/2018 04/22/2018 04/21/2018  WBC 3.6 - 11.0 K/uL - 8.3 5.8  Hemoglobin 12.0 - 16.0 g/dL 5.7(Q) 10.6(L) 11.3(L)  Hematocrit 35.0 - 47.0 % 28.1(L) 31.4(L) 33.5(L)  Platelets 150 - 440 K/uL - 194 221        Past Medical History:  Diagnosis Date  . CAD (coronary artery disease)    a. S/P previous MI;  b. 04/2007 Cath/PCI: Taxus DES' to mid/distal RCA and RPDA as well as Ramus;  c. myoview 8/09: EF 78%, normal perfusion; d. 12/2012 Low risk MV; e. 08/2017 NSTEMI->Med Rx.  . Carotid stenosis    Bilateral, mild to moderate  . Chronic back pain   . Chronic shoulder pain   . HFrEF (heart failure with reduced ejection fraction) ([facility])    a. 08/2017 Echo: EF 20-25%, mid-apicalanteroseptal, ant, antlat, and apical sev HK. Mild MR, mildly dil LA, nl RV fxn.  . Hyperlipidemia    Intolerant of many statins  . Hypertension   . Ischemic cardiomyopathy    a. 08/2017 Echo: EF 20-25%, mid-apicalanteroseptal, ant, antlat, and apical sev HK.  . Mild mitral and aortic regurgitation    a. 11/2011 Echo: EF 55-65%, No RWMA, Gr 1 DD, Mild AI/MR; b. 08/2017 Echo: Mild MR.  Marland Kitchen Neuropathy   . Osteoarthritis   . Osteoporosis   . PAF (paroxysmal atrial fibrillation) ([facility])    a.  08/2017 - noted @ time of NSTEMI. CHA2DS2VASc = 5-->ASA only due to age and h/o falls;  c. On Amio.  Marland Kitchen Pelvic fracture ([facility]) 6/16  . Peripheral vascular disease [facility])     Past Surgical History:  Procedure Laterality Date  . 2 broken arms     . ABDOMINAL HYSTERECTOMY    . ADENOSINE MYOVIEW  01/2006   No ischemia, EF 70%  . BREAST BIOPSY     Bilateral  . CAROTID U/S  10/2006   0-39% bilaterally  . CATARACT EXTRACTION  09/2006   Right  . CORONARY ANGIOPLASTY  12/2002   Medical rx only  . CORONARY ANGIOPLASTY  01/2006   LAD/PAD disease  . CORONARY ANGIOPLASTY  03/2007   3 vessel disease  . CORONARY STENT PLACEMENT  2002   MI, [facility]  . CORONARY STENT PLACEMENT  04/2007   RCA/LCX multiple, [facility]  . CP ADMIT  09/2005   Stress myoview negative  . FEMORAL HERNIA REPAIR  05/2007   Incarcerated, right  . FRACTURE SURGERY  03/2009   Left leg  . hip fracture repair Bilateral   . INTRAMEDULLARY (IM) NAIL INTERTROCHANTERIC Right 05/18/2016   Procedure: INTRAMEDULLARY (IM) NAIL INTERTROCHANTRIC;  Surgeon: [name], MD;  Location: [facility] ORS;  Service: Orthopedics;  Laterality: Right;  . INTRAMEDULLARY (IM) NAIL INTERTROCHANTERIC Left 06/18/2017   Procedure: INTRAMEDULLARY (IM) NAIL INTERTROCHANTRIC;  Surgeon: Deeann Saint, MD;  Location: [facility] ORS;  Service: Orthopedics;  Laterality: Left;  . NM MYOVIEW LTD  11/2004   Stress, negative; NL EF  . two broken wrist on left arm,    . VESICOVAGINAL FISTULA CLOSURE W/ TAH      Prior to Admission medications   Medication Sig Start Date End Date Taking? Authorizing Provider  acetaminophen (TYLENOL 8 HOUR) 650 MG CR tablet Take 650-1,300 mg by mouth every 8 (eight) hours as needed for pain. Do not exceed 3000 mg / 24 hrs of acetaminophen. Consider all sources   Yes [provider]  amiodarone (PACERONE) 200 MG tablet Take 1 tablet (200 mg total) by mouth daily. 12/10/17  Yes Iran Ouch, MD  aspirin EC 81 MG tablet Take 81 mg by  mouth daily.    Yes [provider]  bisacodyl (DULCOLAX) 5 MG EC tablet Take 5-25 mg by mouth daily as needed for moderate constipation or severe constipation.   Yes [provider]  carvedilol (COREG) 3.125 MG tablet TAKE 1 TABLET BY MOUTH TWO  TIMES DAILY 01/28/18  Yes Karie Schwalbe, MD  Cholecalciferol (VITAMIN D3) 2000 units TABS Take 2,000 Units by mouth daily.    Yes [provider]  cyanocobalamin 1000 MCG tablet Take 1,000 mcg by mouth daily.   Yes [provider]  ibandronate (BONIVA) 150 MG tablet Take 1 tablet (150 mg total) by mouth every 30 (thirty) days. Patient taking differently: Take 150 mg by mouth every 30 (thirty) days. Takes around the 15th of the month 03/18/18  Yes Karie Schwalbe, MD  isosorbide mononitrate (IMDUR) 30 MG 24 hr tablet TAKE 1 TABLET BY MOUTH  DAILY 12/10/17  Yes Iran Ouch, MD  Krill Oil 350 MG CAPS Take 350 mg by mouth daily.   Yes [provider]  Lidocaine (ASPERCREME LIDOCAINE) 4 % PTCH Apply 1 patch topically daily as needed (back pain). Apply to lower back/sacrum for pain. Remove after 12 hours.    Yes [provider]  lisinopril (PRINIVIL,ZESTRIL) 10 MG tablet TAKE 1 TABLET BY MOUTH  DAILY 03/22/18  Yes Karie Schwalbe, MD  Morphine Sulfate (MORPHINE CONCENTRATE) 10 mg / 0.5 ml concentrated solution Take 0.25-0.5 mLs (5-10 mg total) by mouth every hour as needed for severe pain or shortness of breath. 09/05/17  Yes Karie Schwalbe, MD  Multiple Vitamins-Minerals (MULTIVITAL) tablet Take 1 tablet by mouth daily.   Yes [provider]  nitroGLYCERIN (NITROSTAT) 0.4 MG SL tablet PLACE 1 TABLET (0.4 MG TOTAL) UNDER THE TONGUE EVERY 5 (FIVE) MINUTES AS NEEDED FOR CHEST PAIN. 09/19/17  Yes Tillman Abide I, MD  rosuvastatin (CRESTOR) 5 MG tablet TAKE 1 TABLET BY MOUTH  DAILY 12/10/17  Yes Karie Schwalbe, MD  senna (SENOKOT) 8.6 MG TABS tablet Take 1 tablet by mouth 2 (two) times daily.    Yes [provider]  traMADol (ULTRAM) 50 MG tablet TAKE 0.5-1 TABLETS (25-50 MG TOTAL) BY MOUTH 3 (THREE) TIMES DAILY AS NEEDED. Patient taking differently: Take 25-50 mg by mouth 3 (three) times daily as needed for moderate pain.  04/04/18  Yes Doreene Nest, NP    Family History  Problem Relation Age of Onset  . Heart disease Mother   . Pulmonary embolism Father   .  Heart disease Sister   . Cancer Brother   . Cancer Sister      Social History   Tobacco Use  . Smoking status: Never Smoker  . Smokeless tobacco: Never Used  Substance Use Topics  . Alcohol use: No  . Drug use: No    Allergies as of 04/21/2018 - Review Complete 04/21/2018  Allergen Reaction Noted  . Bee venom Rash 08/24/2017  . Plavix [clopidogrel] Rash 08/24/2017  . Atorvastatin Other (See Comments) 06/04/2007  . Diltiazem hcl Other (See Comments) 06/04/2007  . Dye fdc red [red dye] Other (See Comments) 06/16/2013  . Gemfibrozil Other (See Comments) 06/04/2007  . Niacin Other (See Comments) 06/04/2007  . Sulfasalazine Rash 06/04/2007    Review of Systems:    Very drowsy - not able to assess    Physical Exam:  Vital signs in last 24 hours: Temp:  [97.2 F (36.2 C)-98.2 F (36.8 C)] 98.2 F (36.8 C) (05/06 0815) Pulse Rate:  [49-62] 61 (05/06 0815) Resp:  [15-18] 17 (05/06 0815) BP: (66-176)/(44-67) 116/56 (05/06 0815) SpO2:  [92 %-100 %] 100 % (05/06 0815) Weight:  [103 lb 8 oz (46.9 kg)-106 lb (48.1 kg)] 103 lb 8 oz (46.9 kg) (05/06 0020) Last BM Date: 04/22/18 General:   Drowsy  Head:  Normocephalic and atraumatic. Ears:  Not assessed  Neck:  Supple; no masses or thyroidomegaly Lungs: Respirations even and unlabored. Lungs clear to auscultation bilaterally.   No wheezes, crackles, or rhonchi.  Heart:  Regular rate and rhythm;  Without murmur, clicks, rubs or gallops Abdomen:  Soft, nondistended, nontender. Normal bowel sounds. No appreciable masses or hepatomegaly.  No rebound  or guarding.  Neurologic:  Alert and oriented x3;  grossly normal neurologically. Skin:  Intact without significant lesions or rashes. Cervical Nodes:  No significant cervical adenopathy. Psych:  Drowsy   LAB RESULTS: Recent Labs    04/21/18 2045 04/22/18 0406 04/22/18 0713  WBC 5.8 8.3  --   HGB 11.3* 10.6* 9.4*  HCT 33.5* 31.4* 28.1*  PLT 221 194  --    BMET Recent Labs    04/21/18 2045 04/22/18 0406  NA 141 143  K 3.3* 3.6  CL 110 109  CO2 25 26  GLUCOSE 139* 102*  BUN 21* 21*  CREATININE 0.98 0.84  CALCIUM 9.0 8.5*   LFT Recent Labs    04/21/18 2045  PROT 6.1*  ALBUMIN 3.3*  AST 21  ALT 16  ALKPHOS 54  BILITOT 0.5   PT/INR Recent Labs    04/21/18 2045  LABPROT 13.6  INR 1.05    STUDIES: No results found.    Impression / Plan:   Christina [name] is a 84 y.o. y/o female admitted with a rectal bleed and >2 grams drop in Hb . Bleeding scan showed bleeding from the left colon , S/p embolization by Dr Wyn Quaker. Now stable   Plan  1. No GI intervention at this time.   I will sign off.  Please call me if any further GI concerns or questions.  We would like to thank you for the opportunity to participate in the care of Christina [name].   Thank you for involving me in the care of this patient.      LOS: 1 day   Wyline Mood, MD  04/22/2018, 9:00 AM

## 2018-04-22 NOTE — Progress Notes (Signed)
Pt had large amt of blood and clots via rectum. BP 92/39, HR in the 60's. Pt a&Ox4, denies chest pain. Called rapid response and paged Dr Nemiah Commander. Dr Nemiah Commander and RRT present in nuclear med. Pt transferred to ICU 17. Report given to [facility], RN.

## 2018-04-23 LAB — HEMOGLOBIN AND HEMATOCRIT, BLOOD
HCT: 26.9 % — ABNORMAL LOW (ref 35.0–47.0)
HEMATOCRIT: 26.5 % — AB (ref 35.0–47.0)
HEMOGLOBIN: 8.8 g/dL — AB (ref 12.0–16.0)
Hemoglobin: 8.8 g/dL — ABNORMAL LOW (ref 12.0–16.0)

## 2018-04-23 LAB — HEMOGLOBIN
HEMOGLOBIN: 10.6 g/dL — AB (ref 12.0–16.0)
HEMOGLOBIN: 9.6 g/dL — AB (ref 12.0–16.0)

## 2018-04-23 LAB — PREPARE RBC (CROSSMATCH)

## 2018-04-23 MED ORDER — PANTOPRAZOLE SODIUM 40 MG PO TBEC
40.0000 mg | DELAYED_RELEASE_TABLET | Freq: Every day | ORAL | Status: DC
Start: 2018-04-23 — End: 2018-04-26
  Administered 2018-04-23 – 2018-04-26 (×4): 40 mg via ORAL
  Filled 2018-04-23 (×4): qty 1

## 2018-04-23 MED ORDER — ALPRAZOLAM 0.25 MG PO TABS
0.2500 mg | ORAL_TABLET | Freq: Once | ORAL | Status: AC
  Administered 2018-04-23: 0.25 mg via ORAL
  Filled 2018-04-23: qty 1

## 2018-04-23 MED ORDER — ADULT MULTIVITAMIN W/MINERALS CH
1.0000 | ORAL_TABLET | Freq: Every day | ORAL | Status: DC
  Administered 2018-04-24 – 2018-04-25 (×2): 1 via ORAL
  Filled 2018-04-23 (×3): qty 1

## 2018-04-23 MED ORDER — MORPHINE SULFATE (PF) 2 MG/ML IV SOLN
1.0000 mg | INTRAVENOUS | Status: DC | PRN
  Administered 2018-04-23: 1 mg via INTRAVENOUS

## 2018-04-23 MED ORDER — SODIUM CHLORIDE 0.9 % IV BOLUS
1000.0000 mL | Freq: Once | INTRAVENOUS | Status: AC
  Administered 2018-04-23: 1000 mL via INTRAVENOUS

## 2018-04-23 MED ORDER — SODIUM CHLORIDE 0.9 % IV SOLN: Freq: Once | INTRAVENOUS | Status: DC

## 2018-04-23 MED ORDER — MORPHINE SULFATE (PF) 2 MG/ML IV SOLN
INTRAVENOUS | Status: AC
  Filled 2018-04-23: qty 1

## 2018-04-23 MED ORDER — CARVEDILOL 3.125 MG PO TABS
3.1250 mg | ORAL_TABLET | Freq: Two times a day (BID) | ORAL | Status: DC
  Administered 2018-04-25 – 2018-04-26 (×3): 3.125 mg via ORAL
  Filled 2018-04-23 (×3): qty 1

## 2018-04-23 NOTE — Progress Notes (Signed)
Initial Nutrition Assessment  DOCUMENTATION CODES:   Severe malnutrition in context of chronic illness  INTERVENTION:  With diet advancement recommend providing Ensure Enlive po BID, each supplement provides 350 kcal and 20 grams of protein.   Provide daily MVI.  NUTRITION DIAGNOSIS:   Severe Malnutrition related to chronic illness(CHF, advanced age) as evidenced by severe fat depletion, severe muscle depletion.  GOAL:   Patient will meet greater than or equal to 90% of their needs  MONITOR:   PO intake, Supplement acceptance, Diet advancement, Labs, Weight trends, I & O's  REASON FOR ASSESSMENT:   Malnutrition Screening Tool    ASSESSMENT:   84 year old female with PMHx of CAD, HTN, OP, PVD, HLD, chronic shoulder and back pain, OA, neuropathy, ischemic cardiomyopathy, HFrEF (EF 20-25% 08/2017), PAF who was admitted with lower GI bleed now s/p embolization on 5/6.   -Per chart patient is followed by outpatient palliative at home.  Met with patient at bedside. She was resting with her eyes closed. RD attempted to ask some questions related to nutrition/weight history but patient kept resting with her eyes closed. No family members at bedside. Patient did nod that it was okay for RD to complete NFPE.  Per chart patient has been fairly weight stable at 98-105 lbs the past year. It looks like weight occasionally trends up, which is likely from fluid in setting of reduced EF.  Medications reviewed and include: amiodarone, pantoprazole.  Labs reviewed: CBG 120. On 5/6 BUN 21.  Discussed with RN and on rounds.  NUTRITION - FOCUSED PHYSICAL EXAM:    Most Recent Value  Orbital Region  Severe depletion  Upper Arm Region  Severe depletion  Thoracic and Lumbar Region  Severe depletion  Buccal Region  Severe depletion  Temple Region  Severe depletion  Clavicle Bone Region  Moderate depletion  Clavicle and Acromion Bone Region  Moderate depletion  Scapular Bone Region  Unable  to assess  Dorsal Hand  Severe depletion  Patellar Region  Severe depletion  Anterior Thigh Region  Severe depletion  Posterior Calf Region  Severe depletion  Edema (RD Assessment)  None  Hair  Reviewed  Eyes  Unable to assess  Mouth  Unable to assess  Skin  Reviewed  Nails  Reviewed     Diet Order:   Diet Order           Diet clear liquid Room service appropriate? Yes; Fluid consistency: Thin  Diet effective now          EDUCATION NEEDS:   Not appropriate for education at this time  Skin:  Skin Assessment: Reviewed RN Assessment(ecchymosis to bilateral arms)  Last BM:  04/22/2018 - small type 7  Height:   Ht Readings from Last 1 Encounters:  04/22/18 _0  (1.499 m)    Weight:   Wt Readings from Last 1 Encounters:  04/22/18 110 lb 7.2 oz (50.1 kg)    Ideal Body Weight:  44.7 kg  BMI:  Body mass index is 22.31 kg/m.  Estimated Nutritional Needs:   Kcal:  1250-1500 (25-30 kcal/kg)  Protein:  60-70 grams (1.2-1.4 grams/kg)  Fluid:  1.25 L/day (25 mL/kg)  Willey Blade, MS, RD, LDN Office: [phone] Pager: [phone] After Hours/Weekend Pager: [phone]

## 2018-04-23 NOTE — Progress Notes (Signed)
Follow up - Critical Care Medicine Note  Patient Details:    Christina [name] is an 84 y.o. female.with a past medical history remarkable for coronary artery disease, carotid stenosis, chronic back pain, heart failure with reduced ejection fraction at 20-25%, hypertension, hyperlipidemia, valvular heart disease, neuropathy, paroxysmal atrial fibrillation, peripheral vascular disease, S/P angiogram and embolization.   Lines, Airways, Drains: External Urinary Catheter (Active)    Anti-infectives:  Anti-infectives (From admission, onward)   Start     Dose/Rate Route Frequency Ordered Stop   04/22/18 1141  ceFAZolin (ANCEF) 2-4 GM/100ML-% IVPB    Note to Pharmacy:  Littie Deeds  : cabinet override      04/22/18 1141 04/22/18 2344   04/22/18 1056  ceFAZolin (ANCEF) IVPB 2g/100 mL premix  Status:  Discontinued     2 g 200 mL/hr over 30 Minutes Intravenous 30 min pre-op 04/22/18 1056 04/22/18 1900      Microbiology: Results for orders placed or performed during the hospital encounter of 04/21/18  MRSA PCR Screening     Status: None   Collection Time: 04/22/18  2:50 PM  Result Value Ref Range Status   MRSA by PCR NEGATIVE NEGATIVE Final    Comment:        The GeneXpert MRSA Assay (FDA approved for NASAL specimens only), is one component of a comprehensive MRSA colonization surveillance program. It is not intended to diagnose MRSA infection nor to guide or monitor treatment for MRSA infections. Performed at [facility], [address] [place], [place] [address]      Studies: Nm Gi Blood Loss  Result Date: 04/22/2018 CLINICAL DATA:  Hypotensive, bright red blood per rectum EXAM: NUCLEAR MEDICINE GASTROINTESTINAL BLEEDING SCAN TECHNIQUE: Sequential abdominal images were obtained following intravenous administration of Tc-10m labeled red blood cells. RADIOPHARMACEUTICALS:  23.63 mCi Tc-10m pertechnetate in-vitro labeled red cells. COMPARISON:  None. FINDINGS:  Examination was prematurely terminated given the active bleeding. Normal physiologic biodistribution of the radiotracer is demonstrated throughout the abdomen. Focal progressively increasing radiotracer activity in the mid descending colon which peristalsis into the distal descending colon and sigmoid colon and also retrograde into the splenic flexure. Findings are consistent with active gastrointestinal bleeding likely originating in the mid descending colon. IMPRESSION: Findings are consistent with active gastrointestinal bleeding likely originating in the mid descending colon. Critical Value/emergent results were called by telephone at the time of interpretation on 04/22/2018 at 10:30 am to Dr. Enid Baas , who verbally acknowledged these results. Electronically Signed   By: Elige Ko   On: 04/22/2018 10:30    Consults: Treatment Team:  Annice Needy, MD   Subjective:    Overnight Issues: patient had one episode of bleeding yesterday postprocedure most likely residual blood in the colon. Other than that has done well. Hemoglobin is 9.1  Objective:  Vital signs for last 24 hours: Temp:  [96.8 F (36 C)-99.3 F (37.4 C)] 99.3 F (37.4 C) (05/07 0720) Pulse Rate:  [62-89] 80 (05/07 0850) Resp:  [12-22] 21 (05/07 0850) BP: (74-185)/(40-118) 122/48 (05/07 0850) SpO2:  [92 %-100 %] 100 % (05/07 0850) Weight:  [110 lb 7.2 oz (50.1 kg)] 110 lb 7.2 oz (50.1 kg) (05/06 1040)  Hemodynamic parameters for last 24 hours:    Intake/Output from previous day: 05/06 0701 - 05/07 0700 In: 1562.5 [I.V.:930; Blood:632.5] Out: 1200 [Urine:500; Stool:700]  Intake/Output this shift: Total I/O In: -  Out: 100 [Urine:100]  Vent settings for last 24 hours:    Physical Exam:  Patient is awake, alert, oriented in no acute distress HEENT: No oral lesions appreciated, trachea is midline, Cardiovascular: Regular rate and rhythm Pulmonary: Clear to auscultation Abdominal: Positive bowel sounds,  soft exam Extremities: No clubbing cyanosis or edema noted Neurologic: Patient moves all extremities  Assessment/Plan:   Lower GI bleed, status post embolization. Patient has stabilized overnight. Hemoglobin is 9.1, stable hemodynamics, no pressors required. Starting clear liquid diet today. If H&H remains stable throughout the day consider transfer to the floor later on  Christina [name] 04/23/2018  *Care during the described time interval was provided by me and/or other providers on the critical care team.  I have reviewed this patient's available data, including medical history, events of note, physical examination and test results as part of my evaluation.

## 2018-04-23 NOTE — Progress Notes (Signed)
[facility] [place] [facility]   PATIENT NAME: Christina [name]    MR#:  [id number]  DATE OF BIRTH:  [birth year]  SUBJECTIVE:  CHIEF COMPLAINT:   Chief Complaint  Patient presents with  . Rectal Bleeding   -Patient came in with lower GI bleed, positive bleeding scan-status post angiogram and embolization yesterday. -Further bleeding this morning.  Hemoglobin is stable around 9.  Did receive 2 units packed RBC transfusion  REVIEW OF SYSTEMS:  Review of Systems  Constitutional: Positive for malaise/fatigue. Negative for chills and fever.  HENT: Positive for hearing loss. Negative for congestion, ear discharge and nosebleeds.   Respiratory: Negative for cough, shortness of breath and wheezing.   Cardiovascular: Positive for chest pain. Negative for palpitations.  Gastrointestinal: Positive for blood in stool. Negative for abdominal pain, constipation, diarrhea, nausea and vomiting.  Genitourinary: Negative for dysuria.  Neurological: Positive for dizziness. Negative for focal weakness, seizures, weakness and headaches.    DRUG ALLERGIES:   Allergies  Allergen Reactions  . Bee Venom Rash  . Plavix [Clopidogrel] Rash  . Atorvastatin Other (See Comments)    Reaction:  Blisters on feet   . Diltiazem Hcl Other (See Comments)    Reaction:  Unknown   . Dye Fdc Red [Red Dye] Other (See Comments)    Reaction:  Unknown   . Gemfibrozil Other (See Comments)    Reaction:  Unknown   . Niacin Other (See Comments)    Reaction:  Unknown   . Sulfasalazine Rash    VITALS:  Blood pressure (!) 133/45, pulse 78, temperature 99.3 F (37.4 C), temperature source Axillary, resp. rate 12, height  (1.499 m), weight 50.1 kg (110 lb 7.2 oz), SpO2 98 %.  PHYSICAL EXAMINATION:  Physical Exam   GENERAL:  84 y.o.-year-old patient lying in the bed with no acute distress.  EYES: Pupils equal, round, reactive to light and accommodation. No scleral icterus. Extraocular muscles  intact.  HEENT: Head atraumatic, normocephalic. Oropharynx and nasopharynx clear.  NECK:  Supple, no jugular venous distention. No thyroid enlargement, no tenderness.  LUNGS: Normal breath sounds bilaterally, no wheezing, rales,rhonchi or crepitation. No use of accessory muscles of respiration. Decreased bibasilar breath sounds CARDIOVASCULAR: S1, S2 normal. No  rubs, or gallops. 3/6 systolic murmur present ABDOMEN: Soft, nontender, nondistended. Bowel sounds present. No organomegaly or mass.  EXTREMITIES: No pedal edema, cyanosis, or clubbing.  NEUROLOGIC: Cranial nerves II through XII are intact. Muscle strength 5/5 in all extremities. Sensation intact. Gait not checked. Global weakness noted. PSYCHIATRIC: The patient is alert and oriented x 3.  SKIN: No obvious rash, lesion, or ulcer.    LABORATORY PANEL:   CBC Recent Labs  Lab 04/22/18 0406 04/22/18 0713  04/23/18 1035  WBC 8.3  --   --   --   HGB 10.6* 9.4*   < > 9.6*  HCT 31.4* 28.1*  --   --   PLT 194  --   --   --    < > = values in this interval not displayed.   ------------------------------------------------------------------------------------------------------------------  Chemistries  Recent Labs  Lab 04/21/18 2045 04/22/18 0406  NA 141 143  K 3.3* 3.6  CL 110 109  CO2 25 26  GLUCOSE 139* 102*  BUN 21* 21*  CREATININE 0.98 0.84  CALCIUM 9.0 8.5*  AST 21  --   ALT 16  --   ALKPHOS 54  --   BILITOT 0.5  --    ------------------------------------------------------------------------------------------------------------------  Cardiac Enzymes Recent Labs  Lab 04/22/18 0740  TROPONINI <0.03   ------------------------------------------------------------------------------------------------------------------  RADIOLOGY:  Nm Gi Blood Loss  Result Date: 04/22/2018 CLINICAL DATA:  Hypotensive, bright red blood per rectum EXAM: NUCLEAR MEDICINE GASTROINTESTINAL BLEEDING SCAN TECHNIQUE: Sequential abdominal  images were obtained following intravenous administration of Tc-4m labeled red blood cells. RADIOPHARMACEUTICALS:  23.63 mCi Tc-47m pertechnetate in-vitro labeled red cells. COMPARISON:  None. FINDINGS: Examination was prematurely terminated given the active bleeding. Normal physiologic biodistribution of the radiotracer is demonstrated throughout the abdomen. Focal progressively increasing radiotracer activity in the mid descending colon which peristalsis into the distal descending colon and sigmoid colon and also retrograde into the splenic flexure. Findings are consistent with active gastrointestinal bleeding likely originating in the mid descending colon. IMPRESSION: Findings are consistent with active gastrointestinal bleeding likely originating in the mid descending colon. Critical Value/emergent results were called by telephone at the time of interpretation on 04/22/2018 at 10:30 am to Dr. Enid Baas , who verbally acknowledged these results. Electronically Signed   By: Elige Ko   On: 04/22/2018 10:30    EKG:   Orders placed or performed during the hospital encounter of 04/21/18  . EKG 12-Lead  . EKG 12-Lead  . EKG 12-Lead  . EKG 12-Lead  . EKG 12-Lead  . EKG 12-Lead    ASSESSMENT AND PLAN:   84 year old female with past medical history significant for CAD status post PCI, carotid stenosis, hypertension, ischemic cardiomyopathy EF of 25%, osteoporosis, paroxysmal atrial fibrillation not on anticoagulation who states that home with her daughter was brought in secondary to rectal bleed,  1.  GI bleed-lower GI, diverticular bleed -He was bloody bowel movements yesterday with vasovagal syncope.  Bleeding scan was positive for active bleed in the descending colon region. -Patient transferred to ICU, had a stat vascular consult followed by mesenteric angiogram and embolization of splenic flexure branch of IMA and sigmoidal branch of IMA. -No active bleeding this morning.  Did receive  2 units packed RBC transfusion and hemoglobin is stable at 9 -Start on a liquid diet.  Advance as tolerated. -GI has been consulted  2.  Chest pain-secondary to drop in blood pressure from the bleeding yesterday. -None at this time Troponin is negative.  Monitor at this time  3.  Hypotension-likely hemorrhagic shock.  Improved after transfusion and stopping GI bleed -Blood pressure is stable at this time -Transfuse if hemoglobin is less than 7  4.  CAD-hold aspirin due to GI bleed.  Blood pressure medications on hold due to bleeding.  5.  A. fib-continue amiodarone.  Will start low-dose Coreg later today blood pressure is stable Hold aspirin for now  6.  DVT prophylaxis-teds and SCDs only   Daughter has been updated at bedside Likely transfer to floor later today   All the records are reviewed and case discussed with Care Management/Social Workerr. Management plans discussed with the patient, family and they are in agreement.  CODE STATUS: DNR  TOTAL CRITICAL CARE TIME SPENT IN TAKING CARE OF THIS PATIENT: 36 minutes.   POSSIBLE D/C IN 1-2 DAYS, DEPENDING ON CLINICAL CONDITION.   Enid Baas M.D on 04/23/2018 at 11:26 AM  Between 7am to 6pm - Pager - [phone]  After 6pm go to [email or web address] - Social research officer, government  [facility]  Office  [phone]  CC: Primary care physician; Karie Schwalbe, MD

## 2018-04-23 NOTE — Progress Notes (Signed)
Pt confused this am. After napping much of day, her confusion is only to time of day now. Overnight she had no bleeding from rectum. Had no bleeding from rectum today  until 1730. She has had steady decrease in hgb since 7pm yesterday. Last hgb at 1715 was 8.8. Dr Belia Heman aware of bloody stool and last hgb..  VSS. Has had clear liquids po today. On protonix po. Daughter here and aware of hgb trending down and bloody stool at 1730.

## 2018-04-23 NOTE — Progress Notes (Signed)
Pt consistently wanting to get OOB.  She is having difficulty with knowing the time of day.   The "staff outside her room makes it daytime."  Reorient patient.  Provide her with some ice chips.  Bed alarm on.

## 2018-04-23 NOTE — Progress Notes (Signed)
Daughter at beside for patient comfort.

## 2018-04-23 NOTE — Progress Notes (Signed)
Pt has 2 large bright red bloody stools.  The first stool had clots.  Annabelle Harman, NP notified.   Lab technician obtaining H/H at this time.

## 2018-04-24 ENCOUNTER — Inpatient Hospital Stay: Payer: Medicare Other

## 2018-04-24 DIAGNOSIS — E43 Unspecified severe protein-calorie malnutrition: Secondary | ICD-10-CM

## 2018-04-24 DIAGNOSIS — K922 Gastrointestinal hemorrhage, unspecified: Secondary | ICD-10-CM

## 2018-04-24 LAB — URINALYSIS, COMPLETE (UACMP) WITH MICROSCOPIC
BACTERIA UA: NONE SEEN
BILIRUBIN URINE: NEGATIVE
GLUCOSE, UA: NEGATIVE mg/dL
HGB URINE DIPSTICK: NEGATIVE
KETONES UR: 5 mg/dL — AB
Nitrite: NEGATIVE
PH: 5 (ref 5.0–8.0)
PROTEIN: NEGATIVE mg/dL
Specific Gravity, Urine: 1.018 (ref 1.005–1.030)

## 2018-04-24 LAB — CBC
HCT: 21.8 % — ABNORMAL LOW (ref 35.0–47.0)
Hemoglobin: 7.2 g/dL — ABNORMAL LOW (ref 12.0–16.0)
MCH: 30 pg (ref 26.0–34.0)
MCHC: 32.9 g/dL (ref 32.0–36.0)
MCV: 91.2 fL (ref 80.0–100.0)
PLATELETS: 124 10*3/uL — AB (ref 150–440)
RBC: 2.39 MIL/uL — AB (ref 3.80–5.20)
RDW: 18.2 % — AB (ref 11.5–14.5)
WBC: 22.8 10*3/uL — AB (ref 3.6–11.0)

## 2018-04-24 LAB — HEMOGLOBIN AND HEMATOCRIT, BLOOD
HCT: 24.7 % — ABNORMAL LOW (ref 35.0–47.0)
HEMATOCRIT: 25.3 % — AB (ref 35.0–47.0)
HEMOGLOBIN: 8.2 g/dL — AB (ref 12.0–16.0)
HEMOGLOBIN: 8.4 g/dL — AB (ref 12.0–16.0)

## 2018-04-24 LAB — PROCALCITONIN: Procalcitonin: 0.36 ng/mL

## 2018-04-24 MED ORDER — TECHNETIUM TC 99M-LABELED RED BLOOD CELLS IV KIT
22.0730 | PACK | Freq: Once | INTRAVENOUS | Status: AC | PRN
  Administered 2018-04-24: 22.073 via INTRAVENOUS

## 2018-04-24 MED ORDER — SODIUM CHLORIDE 0.9 % IV BOLUS: 250.0000 mL | Freq: Once | INTRAVENOUS | Status: DC

## 2018-04-24 NOTE — Progress Notes (Signed)
Pt refused to wear BiPAP mask tonight, pt stated "it makes me sick to my stomach"

## 2018-04-24 NOTE — Progress Notes (Signed)
Order received for cpap. Placed on cpap. Tolerated well

## 2018-04-24 NOTE — Progress Notes (Signed)
   04/24/18 1115  Clinical Encounter Type  Visited With Family  Visit Type Initial   Introductory visit to family who was awaiting patient from testing.  No spiritual needs reported, but they asked about patient status.  Chaplain checked in with nurse who was still waiting to hear but went to speak with them.

## 2018-04-24 NOTE — Progress Notes (Signed)
   04/24/18 1340  Clinical Encounter Type  Visited With Patient  Visit Type Follow-up  Spiritual Encounters  Spiritual Needs Prayer   Chaplain attempted follow up with family and patient.  Patient alone in the room; chaplain offered silent prayers for patient, family, and care team.

## 2018-04-24 NOTE — Progress Notes (Signed)
 Vein and Vascular Surgery  Daily Progress Note   Subjective  - 2 Days Post-Op  Had more bleeding overnight and this morning although it seems to have stopped now.  Moved back to the ICU.  Last hemoglobin was stable.  Not having any pain.  She underwent a repeat bleeding scan this morning which I have independently reviewed.  No active gastrointestinal bleeding was identified on the bleeding scan  Objective Vitals:   04/24/18 0918 04/24/18 1003 04/24/18 1019 04/24/18 1054  BP: (!) 114/38 (!) 112/37 (!) 112/41 (!) 119/43  Pulse:      Resp:      Temp:      TempSrc:      SpO2:      Weight:      Height:        Intake/Output Summary (Last 24 hours) at 04/24/2018 1219 Last data filed at 04/24/2018 0745 Gross per 24 hour  Intake 2160 ml  Output 450 ml  Net 1710 ml    PULM  CTAB CV  somewhat irregular Abdomen soft  Laboratory CBC    Component Value Date/Time   WBC 22.8 (H) 04/24/2018 0325   HGB 8.2 (L) 04/24/2018 1115   HGB 12.7 05/23/2014 1603   HCT 24.7 (L) 04/24/2018 1115   HCT 39.0 05/23/2014 1603   PLT 124 (L) 04/24/2018 0325   PLT 200 05/23/2014 1603    BMET    Component Value Date/Time   NA 143 04/22/2018 0406   NA 141 05/23/2014 1603   K 3.6 04/22/2018 0406   K 3.8 05/23/2014 1603   CL 109 04/22/2018 0406   CL 109 (H) 05/23/2014 1603   CO2 26 04/22/2018 0406   CO2 26 05/23/2014 1603   GLUCOSE 102 (H) 04/22/2018 0406   GLUCOSE 165 (H) 05/23/2014 1603   BUN 21 (H) 04/22/2018 0406   BUN 26 (H) 05/23/2014 1603   CREATININE 0.84 04/22/2018 0406   CREATININE 1.02 05/23/2014 1603   CREATININE 0.98 06/07/2011 1015   CALCIUM 8.5 (L) 04/22/2018 0406   CALCIUM 8.9 05/23/2014 1603   GFRNONAA 57 (L) 04/22/2018 0406   GFRNONAA 48 (L) 05/23/2014 1603   GFRAA >60 04/22/2018 0406   GFRAA 55 (L) 05/23/2014 1603    Assessment/Planning: POD #2 s/p IMA embolization for acute bleeding   Recurrent bleeding this morning.  No active bleeding on the bleeding  scan.  No role for embolization at current which is likely a good thing as repeat embolization carries with it a much higher risk of ischemia.  If she has further severe and life-threatening hemorrhage I would recommend repeating the bleeding scan and if the source is identified a tough decision will have to be made.  She is a very poor surgical candidate and may be worth the risk of repeat embolization even though that risk is significant.  Very complex and difficult situation on an elderly patient.  High risk of morbidity and mortality.    Festus Barren  04/24/2018, 12:19 PM

## 2018-04-24 NOTE — Progress Notes (Addendum)
[facility] [place] [facility]   PATIENT NAME: Christina [name]    MR#:  [id number]  DATE OF BIRTH:  Aug 25, 1920  SUBJECTIVE:  CHIEF COMPLAINT:   Chief Complaint  Patient presents with  . Rectal Bleeding   -sleeping with CPAP on. -2 more large bloody bowel movements yesterday evening with drop in hemoglobin to 7, receiving blood transfusion now  REVIEW OF SYSTEMS:  Review of Systems  Constitutional: Positive for malaise/fatigue. Negative for chills and fever.  HENT: Positive for hearing loss. Negative for congestion, ear discharge and nosebleeds.   Respiratory: Negative for cough, shortness of breath and wheezing.   Cardiovascular: Positive for chest pain. Negative for palpitations.  Gastrointestinal: Positive for blood in stool. Negative for abdominal pain, constipation, diarrhea, nausea and vomiting.  Genitourinary: Negative for dysuria.  Neurological: Positive for dizziness. Negative for focal weakness, seizures, weakness and headaches.    DRUG ALLERGIES:   Allergies  Allergen Reactions  . Bee Venom Rash  . Plavix [Clopidogrel] Rash  . Atorvastatin Other (See Comments)    Reaction:  Blisters on feet   . Diltiazem Hcl Other (See Comments)    Reaction:  Unknown   . Dye Fdc Red [Red Dye] Other (See Comments)    Reaction:  Unknown   . Gemfibrozil Other (See Comments)    Reaction:  Unknown   . Niacin Other (See Comments)    Reaction:  Unknown   . Sulfasalazine Rash    VITALS:  Blood pressure 120/65, pulse 76, temperature 98.1 F (36.7 C), temperature source Axillary, resp. rate (!) 23, height  (1.499 m), weight 47.5 kg (104 lb 11.5 oz), SpO2 97 %.  PHYSICAL EXAMINATION:  Physical Exam   GENERAL:  84 y.o.-year-old patient lying in the bed with no acute distress.  EYES: Pupils equal, round, reactive to light and accommodation. No scleral icterus. Extraocular muscles intact.  HEENT: Head atraumatic, normocephalic. Oropharynx and nasopharynx  clear.  NECK:  Supple, no jugular venous distention. No thyroid enlargement, no tenderness.  LUNGS: Normal breath sounds bilaterally, no wheezing, rales,rhonchi or crepitation. No use of accessory muscles of respiration. Decreased bibasilar breath sounds CARDIOVASCULAR: S1, S2 normal. No  rubs, or gallops. 3/6 systolic murmur present ABDOMEN: Soft, nontender, nondistended. Bowel sounds present. No organomegaly or mass.  EXTREMITIES: No pedal edema, cyanosis, or clubbing.  NEUROLOGIC: Cranial nerves II through XII are intact. Muscle strength 5/5 in all extremities. Sensation intact. Gait not checked. Global weakness noted. PSYCHIATRIC: The patient is alert and oriented x 3.  SKIN: No obvious rash, lesion, or ulcer.    LABORATORY PANEL:   CBC Recent Labs  Lab 04/24/18 0325  WBC 22.8*  HGB 7.2*  HCT 21.8*  PLT 124*   ------------------------------------------------------------------------------------------------------------------  Chemistries  Recent Labs  Lab 04/21/18 2045 04/22/18 0406  NA 141 143  K 3.3* 3.6  CL 110 109  CO2 25 26  GLUCOSE 139* 102*  BUN 21* 21*  CREATININE 0.98 0.84  CALCIUM 9.0 8.5*  AST 21  --   ALT 16  --   ALKPHOS 54  --   BILITOT 0.5  --    ------------------------------------------------------------------------------------------------------------------  Cardiac Enzymes Recent Labs  Lab 04/22/18 0740  TROPONINI <0.03   ------------------------------------------------------------------------------------------------------------------  RADIOLOGY:  Nm Gi Blood Loss  Result Date: 04/22/2018 CLINICAL DATA:  Hypotensive, bright red blood per rectum EXAM: NUCLEAR MEDICINE GASTROINTESTINAL BLEEDING SCAN TECHNIQUE: Sequential abdominal images were obtained following intravenous administration of Tc-70m labeled red blood cells. RADIOPHARMACEUTICALS:  23.63  mCi Tc-38m pertechnetate in-vitro labeled red cells. COMPARISON:  None. FINDINGS: Examination  was prematurely terminated given the active bleeding. Normal physiologic biodistribution of the radiotracer is demonstrated throughout the abdomen. Focal progressively increasing radiotracer activity in the mid descending colon which peristalsis into the distal descending colon and sigmoid colon and also retrograde into the splenic flexure. Findings are consistent with active gastrointestinal bleeding likely originating in the mid descending colon. IMPRESSION: Findings are consistent with active gastrointestinal bleeding likely originating in the mid descending colon. Critical Value/emergent results were called by telephone at the time of interpretation on 04/22/2018 at 10:30 am to Dr. Enid Baas , who verbally acknowledged these results. Electronically Signed   By: Elige Ko   On: 04/22/2018 10:30    EKG:   Orders placed or performed during the hospital encounter of 04/21/18  . EKG 12-Lead  . EKG 12-Lead  . EKG 12-Lead  . EKG 12-Lead  . EKG 12-Lead  . EKG 12-Lead    ASSESSMENT AND PLAN:   84 year old female with past medical history significant for CAD status post PCI, carotid stenosis, hypertension, ischemic cardiomyopathy EF of 25%, osteoporosis, paroxysmal atrial fibrillation not on anticoagulation who states that home with her daughter was brought in secondary to rectal bleed,  1.  GI bleed-lower GI, diverticular bleed -had bloody bowel movements with vasovagal syncope.  Bleeding scan was positive for active bleed in the descending colon region. S/p mesenteric angiogram and embolization of splenic flexure branch of IMA and sigmoidal branch of IMA. -more bleeding overnight- will repeat bleeding scan today- if theres active bleed from the same area- rather than repeat embolization, colectomy is preferable due to high risk of ischemia according to vascular.  Benefits bleeding from a different area, they can attempt embolization again. -Initially received 2 units packed RBC transfusion,  but due to repeat bleeding, 1 more unit ordered -GI has been consulted -Monitoring of hemoglobin.  Transfuse as needed  2.  Hypotension-likely hemorrhagic shock.  Improved after transfusion and stopping GI bleed -Blood pressure is stable at this time -Transfuse as needed  3. Leukocytosis- stress margination vs infection - check UA  4.  CAD-hold aspirin due to GI bleed.  Blood pressure medications on hold due to bleeding.  5.  A. fib-continue amiodarone.  started low-dose Coreg later today blood pressure is stable Hold aspirin for now  6.  DVT prophylaxis-teds and SCDs only   Daughter has been updated at bedside Discussed with vascular, ICU attending   All the records are reviewed and case discussed with Care Management/Social Workerr. Management plans discussed with the patient, family and they are in agreement.  CODE STATUS: DNR  TOTAL CRITICAL CARE TIME SPENT IN TAKING CARE OF THIS PATIENT: 41 minutes.   POSSIBLE D/C IN 1-2 DAYS, DEPENDING ON CLINICAL CONDITION.   Enid Baas M.D on 04/24/2018 at 7:54 AM  Between 7am to 6pm - Pager - [phone]  After 6pm go to [email or web address] - Social research officer, government  [facility]  Office  [phone]  CC: Primary care physician; Karie Schwalbe, MD

## 2018-04-24 NOTE — Progress Notes (Signed)
Follow up - Critical Care Medicine Note  Patient Details:    Christina [name] is an 84 y.o. female.with a past medical history remarkable for coronary artery disease, carotid stenosis, chronic back pain, heart failure with reduced ejection fraction at 20-25%, hypertension, hyperlipidemia, valvular heart disease, neuropathy, paroxysmal atrial fibrillation, peripheral vascular disease, S/P angiogram and embolization.   Lines, Airways, Drains: External Urinary Catheter (Active)    Anti-infectives:  Anti-infectives (From admission, onward)   Start     Dose/Rate Route Frequency Ordered Stop   04/22/18 1141  ceFAZolin (ANCEF) 2-4 GM/100ML-% IVPB    Note to Pharmacy:  Littie Deeds  : cabinet override      04/22/18 1141 04/22/18 2344   04/22/18 1056  ceFAZolin (ANCEF) IVPB 2g/100 mL premix  Status:  Discontinued     2 g 200 mL/hr over 30 Minutes Intravenous 30 min pre-op 04/22/18 1056 04/22/18 1900      Microbiology: Results for orders placed or performed during the hospital encounter of 04/21/18  MRSA PCR Screening     Status: None   Collection Time: 04/22/18  2:50 PM  Result Value Ref Range Status   MRSA by PCR NEGATIVE NEGATIVE Final    Comment:        The GeneXpert MRSA Assay (FDA approved for NASAL specimens only), is one component of a comprehensive MRSA colonization surveillance program. It is not intended to diagnose MRSA infection nor to guide or monitor treatment for MRSA infections. Performed at [facility], [address] [place], [place] [address]      Studies: Nm Gi Blood Loss  Result Date: 04/22/2018 CLINICAL DATA:  Hypotensive, bright red blood per rectum EXAM: NUCLEAR MEDICINE GASTROINTESTINAL BLEEDING SCAN TECHNIQUE: Sequential abdominal images were obtained following intravenous administration of Tc-2m labeled red blood cells. RADIOPHARMACEUTICALS:  23.63 mCi Tc-17m pertechnetate in-vitro labeled red cells. COMPARISON:  None. FINDINGS:  Examination was prematurely terminated given the active bleeding. Normal physiologic biodistribution of the radiotracer is demonstrated throughout the abdomen. Focal progressively increasing radiotracer activity in the mid descending colon which peristalsis into the distal descending colon and sigmoid colon and also retrograde into the splenic flexure. Findings are consistent with active gastrointestinal bleeding likely originating in the mid descending colon. IMPRESSION: Findings are consistent with active gastrointestinal bleeding likely originating in the mid descending colon. Critical Value/emergent results were called by telephone at the time of interpretation on 04/22/2018 at 10:30 am to Dr. Enid Baas , who verbally acknowledged these results. Electronically Signed   By: Elige Ko   On: 04/22/2018 10:30    Consults: Treatment Team:  Annice Needy, MD   Subjective:    Overnight Issues: patient had an additional episode of bleeding with decreasing hemoglobin to 7.2  Objective:  Vital signs for last 24 hours: Temp:  [98.1 F (36.7 C)-98.9 F (37.2 C)] 98.1 F (36.7 C) (05/08 0510) Pulse Rate:  [73-97] 76 (05/08 0700) Resp:  [8-26] 23 (05/08 0700) BP: (85-134)/(34-65) 114/38 (05/08 0918) SpO2:  [85 %-100 %] 97 % (05/08 0700) Weight:  [104 lb 11.5 oz (47.5 kg)] 104 lb 11.5 oz (47.5 kg) (05/08 0500)  Hemodynamic parameters for last 24 hours:    Intake/Output from previous day: 05/07 0701 - 05/08 0700 In: 1850 [P.O.:600; I.V.:1000; IV Piggyback:250] Out: 550 [Urine:400; Stool:150]  Intake/Output this shift: Total I/O In: 310 [Blood:310] Out: -   Vent settings for last 24 hours:    Physical Exam:  Patient is awake, alert, oriented in no acute distress  HEENT: No oral lesions appreciated, trachea is midline, Cardiovascular: Regular rate and rhythm Pulmonary: Clear to auscultation Abdominal: Positive bowel sounds, soft exam Extremities: No clubbing cyanosis or edema  noted Neurologic: Patient moves all extremities  Assessment/Plan:   Lower GI bleed, status post embolization. Patient unfortunately had another episode of bleeding, hemoglobin decreased to 7.2 requiring transfusion. Discussed with vascular, obtaining additional bleeding scan if bleeding in separate location consider repeat embolization if same location too high risk for ischemic bowel.  Christina [name] 04/24/2018  *Care during the described time interval was provided by me and/or other providers on the critical care team.  I have reviewed this patient's available data, including medical history, events of note, physical examination and test results as part of my evaluation. Patient ID: Christina [name], female   DOB: 1920/01/21, 84 y.o.   MRN: [id number]

## 2018-04-25 ENCOUNTER — Inpatient Hospital Stay: Payer: Medicare Other

## 2018-04-25 DIAGNOSIS — Z515 Encounter for palliative care: Secondary | ICD-10-CM

## 2018-04-25 DIAGNOSIS — I5022 Chronic systolic (congestive) heart failure: Secondary | ICD-10-CM

## 2018-04-25 DIAGNOSIS — E43 Unspecified severe protein-calorie malnutrition: Secondary | ICD-10-CM

## 2018-04-25 DIAGNOSIS — Z7189 Other specified counseling: Secondary | ICD-10-CM

## 2018-04-25 DIAGNOSIS — K625 Hemorrhage of anus and rectum: Secondary | ICD-10-CM

## 2018-04-25 DIAGNOSIS — K922 Gastrointestinal hemorrhage, unspecified: Secondary | ICD-10-CM

## 2018-04-25 LAB — BPAM RBC
BLOOD PRODUCT EXPIRATION DATE: 201905272359
BLOOD PRODUCT EXPIRATION DATE: 201905282359
Blood Product Expiration Date: 201905092359
Blood Product Expiration Date: 201905112359
Blood Product Expiration Date: 201905282359
Blood Product Expiration Date: 201905292359
ISSUE DATE / TIME: 201905061403
ISSUE DATE / TIME: 201905061124
ISSUE DATE / TIME: 201905080450
UNIT TYPE AND RH: 5100
Unit Type and Rh: 5100
Unit Type and Rh: 5100
Unit Type and Rh: 5100
Unit Type and Rh: 9500
Unit Type and Rh: 9500

## 2018-04-25 LAB — CBC
HEMATOCRIT: 24.4 % — AB (ref 35.0–47.0)
Hemoglobin: 8.3 g/dL — ABNORMAL LOW (ref 12.0–16.0)
MCH: 29.5 pg (ref 26.0–34.0)
MCHC: 33.9 g/dL (ref 32.0–36.0)
MCV: 87 fL (ref 80.0–100.0)
PLATELETS: 136 10*3/uL — AB (ref 150–440)
RBC: 2.81 MIL/uL — AB (ref 3.80–5.20)
RDW: 19.4 % — AB (ref 11.5–14.5)
WBC: 20.9 10*3/uL — ABNORMAL HIGH (ref 3.6–11.0)

## 2018-04-25 LAB — TYPE AND SCREEN
ABO/RH(D): O POS
Antibody Screen: NEGATIVE
UNIT DIVISION: 0
UNIT DIVISION: 0
UNIT DIVISION: 0
Unit division: 0
Unit division: 0
Unit division: 0

## 2018-04-25 LAB — BASIC METABOLIC PANEL
Anion gap: 8 (ref 5–15)
BUN: 22 mg/dL — ABNORMAL HIGH (ref 6–20)
CALCIUM: 7.4 mg/dL — AB (ref 8.9–10.3)
CO2: 18 mmol/L — AB (ref 22–32)
Chloride: 117 mmol/L — ABNORMAL HIGH (ref 101–111)
Creatinine, Ser: 0.91 mg/dL (ref 0.44–1.00)
GFR, EST AFRICAN AMERICAN: 60 mL/min — AB (ref >60)
GFR, EST NON AFRICAN AMERICAN: 52 mL/min — AB (ref >60)
GLUCOSE: 72 mg/dL (ref 65–99)
POTASSIUM: 3.3 mmol/L — AB (ref 3.5–5.1)
Sodium: 143 mmol/L (ref 135–145)

## 2018-04-25 LAB — GLUCOSE, CAPILLARY
GLUCOSE-CAPILLARY: 134 mg/dL — AB (ref 65–99)
Glucose-Capillary: 70 mg/dL (ref 65–99)

## 2018-04-25 MED ORDER — DEXTROSE 50 % IV SOLN
INTRAVENOUS | Status: AC
  Administered 2018-04-25: 25 mL via INTRAVENOUS
  Filled 2018-04-25: qty 50

## 2018-04-25 MED ORDER — POTASSIUM CHLORIDE 20 MEQ/15ML (10%) PO SOLN
40.0000 meq | Freq: Once | ORAL | Status: AC
Start: 2018-04-25 — End: 2018-04-25
  Administered 2018-04-25: 40 meq via ORAL
  Filled 2018-04-25: qty 30

## 2018-04-25 MED ORDER — DEXTROSE 50 % IV SOLN
25.0000 mL | Freq: Once | INTRAVENOUS | Status: AC
  Administered 2018-04-25: 25 mL via INTRAVENOUS

## 2018-04-25 NOTE — Consult Note (Signed)
Consultation Note Date: 04/25/2018   Patient Name: Christina [name]  DOB: 05-Jun-1920  MRN: [id number]  Age / Sex: 84 y.o., female  PCP: Christina Carbon, MD Referring Physician: Gladstone Lighter, MD  Reason for Consultation: Establishing goals of care   "I never want to go thru anything like this again"  HPI/Patient Profile: 84 y.o. female  with past medical history of PVD, paroxysmal atrial fibrillation, CAD s/p MI last September, chronic constipation, malnutrition, multiple falls (pelvic fracture, left then right hip fractures) who was admitted on 04/21/2018 with large volume rectal bleeding.   An active bleed was seen in the left colon on NM bleeding scan. She underwent angiogram and embolization of portions of her IMA on 04/22/2018.  On the night of 5/7 - 5/8 she bled once again.  This time the bleeding scan was not positive.  Vascular surgery recommended against embolization again for fear of causing bowel infarct.  Clinical Assessment and Goals of Care:  I have reviewed medical records including EPIC notes, labs and imaging, received report from CCM team, assessed the patient and then met at the bedside along with her daughter, Christina [name]  to discuss diagnosis prognosis, [place], EOL wishes, disposition and options.  I introduced Palliative Medicine as specialized medical care for people living with serious illness. It focuses on providing relief from the symptoms and stress of a serious illness. The goal is to improve quality of life for both the patient and the family.  We discussed a brief life review of the patient.  She is native to [place] area.  She raised 2 sons and a daughter.  She lost the youngest son when he was 67.  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  Her first husband passed away and she re-married.  Her second husband has passed away now as  well.  She lives in her own home.  She daughter lives 7 minutes away but has been staying with her as she has been declining.  She has a small dog that she adores.  Per [name], quality of life is being able to do "for myself".  She does not want to be bed bound.  She wants to be up and able to move around with her walker.   If she is not able to do that then she would rather not pursue aggressive medical care.  I spoke with Christina [name] in detail in a private conference room.  Her mother has declined significantly since last September when she had her MI.  She walks with a walker and needs help with some ADLs.  Christina [name] is afraid to leave her alone even for a minute as her mother will get up and attempt to do something - and in the process she will fall.   Christina [name] is 84 yo.  The patient's son is 57 yo and they have some physical limitations as well (they can not lift her into the bath tub).  Christina [name] and I discussed [name] GI  bleeding, PVD, and CHF.  I explained that on-going physical therapy may not be beneficial as it would simply be setting the patient up for failure.  She may be becoming too weak.   We talked about what to do if [name] bleeds again.  Christina [name] and [name] agree that if there is another large bleed she does not want blood transfusions or procedures - she wants full comfort care.  Christina [name] and the patient understand that another large bleed would be an end of life event.    However, if [name] does not re-bleed for 48 hours, and she is able to advance her diet - the patient and her family would like for her to have a PT eval (to see if she can stand, pivot, etc) and then DC to her home with hospice care.  We talked a little about the two forms of hospice.  Christina [name] and Mrs. [name] are hopeful for Hospice in their home if she stops bleeding and they will consider hospice house if she has another large bleed.   Primary Decision Maker:  PATIENT and her daughter Christina [name]    SUMMARY OF  RECOMMENDATIONS    If no bleeding for 48 hours and diet is able to be safely advanced please plan for DC to home with hospice.  If another large bleed occurs do not give blood.  Shift to full comfort.  Consider [facility] death. Otherwise treat the treatable.  Given frailty, recurrent falls, PVD, CHF, valvular disease patient would likely not do well at SNF.  Family prefers home with hospice.  PMT will follow with you.  Code Status/Advance Care Planning:  DNR  Prognosis:  Another large bleed will take her life quickly (hours to days).  If she stops bleeding her prognosis is likely in months given advanced age, frailty, frequent falls, malnutrition, CHF (EF 20%) and goals of care.    Discharge Planning: Home with Hospice if she stops bleeding.   If she bleeds then hospital death vs [place].      Primary Diagnoses: Present on Admission: . BRBPR (bright red blood per rectum) . Hyperlipidemia . Essential hypertension . Coronary artery disease . Chronic systolic heart failure ([facility]) . Paroxysmal atrial fibrillation ([facility])   I have reviewed the medical record, interviewed the patient and family, and examined the patient. The following aspects are pertinent.  Past Medical History:  Diagnosis Date  . CAD (coronary artery disease)    a. S/P previous MI;  b. 04/2007 Cath/PCI: Taxus DES' to mid/distal RCA and RPDA as well as Ramus;  c. myoview 8/09: EF 78%, normal perfusion; d. 12/2012 Low risk MV; e. 08/2017 NSTEMI->Med Rx.  . Carotid stenosis    Bilateral, mild to moderate  . Chronic back pain   . Chronic shoulder pain   . HFrEF (heart failure with reduced ejection fraction) ([facility])    a. 08/2017 Echo: EF 20-25%, mid-apicalanteroseptal, ant, antlat, and apical sev HK. Mild MR, mildly dil LA, nl RV fxn.  . Hyperlipidemia    Intolerant of many statins  . Hypertension   . Ischemic cardiomyopathy    a. 08/2017 Echo: EF 20-25%, mid-apicalanteroseptal, ant, antlat, and  apical sev HK.  . Mild mitral and aortic regurgitation    a. 11/2011 Echo: EF 55-65%, No RWMA, Gr 1 DD, Mild AI/MR; b. 08/2017 Echo: Mild MR.  Marland Kitchen Neuropathy   . Osteoarthritis   . Osteoporosis   . PAF (paroxysmal atrial fibrillation) ([facility])  a. 08/2017 - noted @ time of NSTEMI. CHA2DS2VASc = 5-->ASA only due to age and h/o falls;  c. On [place].  Marland Kitchen Pelvic fracture ([facility]) 6/16  . Peripheral vascular disease ([facility])    Social History   Socioeconomic History  . Marital status: Widowed    Spouse name: Not on file  . Number of children: 3  . Years of education: 36  . Highest education level: Not on file  Occupational History    Comment: retired from retail  Social Needs  . Financial resource strain: Not on file  . Food insecurity:    Worry: Not on file    Inability: Not on file  . Transportation needs:    Medical: Not on file    Non-medical: Not on file  Tobacco Use  . Smoking status: Never Smoker  . Smokeless tobacco: Never Used  Substance and Sexual Activity  . Alcohol use: No  . Drug use: No  . Sexual activity: Not on file  Lifestyle  . Physical activity:    Days per week: Not on file    Minutes per session: Not on file  . Stress: Not on file  Relationships  . Social connections:    Talks on phone: Not on file    Gets together: Not on file    Attends religious service: Not on file    Active member of club or organization: Not on file    Attends meetings of clubs or organizations: Not on file    Relationship status: Not on file  Other Topics Concern  . Not on file  Social History Narrative   Widowed 2005 after long time care for husband after stroke   Remarried but widowed again 11/18      No formal living will   Daughter Christina [name] should be her health care POA   Has DNR already   No feeding tube if cognitively unaware   Family History  Problem Relation Age of Onset  . Heart disease Mother   . Pulmonary embolism Father   . Heart disease Sister   . Cancer Brother   .  Cancer Sister    Scheduled Meds: . amiodarone  200 mg Oral Daily  . carvedilol  3.125 mg Oral BID WC  . multivitamin with minerals  1 tablet Oral Daily  . pantoprazole  40 mg Oral Daily  . rosuvastatin  5 mg Oral Daily   Continuous Infusions: . sodium chloride Stopped (04/23/18 2134)  . sodium chloride 250 mL/hr at 04/24/18 0600   PRN Meds:.acetaminophen **OR** acetaminophen, diphenhydrAMINE, morphine injection, ondansetron **OR** ondansetron (ZOFRAN) IV Allergies  Allergen Reactions  . Bee Venom Rash  . Plavix [Clopidogrel] Rash  . Atorvastatin Other (See Comments)    Reaction:  Blisters on feet   . Diltiazem Hcl Other (See Comments)    Reaction:  Unknown   . Dye Fdc Red [Red Dye] Other (See Comments)    Reaction:  Unknown   . Gemfibrozil Other (See Comments)    Reaction:  Unknown   . Niacin Other (See Comments)    Reaction:  Unknown   . Sulfasalazine Rash   Review of Systems Happy to be eating chicken broth.  No current complaints of pain, or discomfort.    Physical Exam  Thin frail elderly female, Awake, A&O, conversation tends to drift off on tangents but she is easily redirectable. CV rrr Respiration no distress, no w/r/c Abdomen soft, nt, nd Ext no edema  Vital Signs: BP (!) 159/50  Pulse 69   Temp 97.6 F (36.4 C) (Axillary)   Resp 18   Ht 4' 11"  (1.499 m)   Wt 47.3 kg (104 lb 4.4 oz)   SpO2 96%   BMI 21.06 kg/m  Pain Scale: 0-10 POSS *See Group Information*: S-Acceptable,Sleep, easy to arouse Pain Score: 0-No pain   SpO2: SpO2: 96 % O2 Device:SpO2: 96 % O2 Flow Rate: .O2 Flow Rate (L/min): 2 L/min  IO: Intake/output summary:   Intake/Output Summary (Last 24 hours) at 04/25/2018 1217 Last data filed at 04/24/2018 1416 Gross per 24 hour  Intake -  Output 450 ml  Net -450 ml    LBM: Last BM Date: 04/23/18 Baseline Weight: Weight: 48.1 kg (106 lb) Most recent weight: Weight: 47.3 kg (104 lb 4.4 oz)     Palliative Assessment/Data:  40%     Time In: 11:00 Time Out: 12:10 Time Total: 70 min. Greater than 50%  of this time was spent counseling and coordinating care related to the above assessment and plan.  Signed by: Florentina Jenny, PA-C Palliative Medicine Pager: [phone]  Please contact Palliative Medicine Team phone at [phone] for questions and concerns.  For individual provider: See Shea Evans

## 2018-04-25 NOTE — Progress Notes (Signed)
[facility]  Daily Progress Note   Subjective: 3 Days Post-Op: Ultrasound guidance for vascular access right femoral artery, Catheter placement into splenic flexure branch of the IMA and sigmoidal branch of the IMA from right femoral approach, Aortogram and selective angiogram of the IMA including selective injections of a splenic flexure branch and a sigmoidal branch, Microbead embolization of the splenic flexure branch of the IMA with 1.5 cc of 500-700  polyvinyl alcohol beads, Microbead embolization of the sigmoidal branch of the IMA with 1 cc of 500 to 700  polyvinyl alcohol beads with StarClose closure device right femoral artery.  Patient seen with family members at bedside.  Patient is without complaint.  Patient denies any continued episodes of bleeding.  The patient has been transferred out of the ICU to the medical floor.   Objective: Vitals:   04/25/18 1100 04/25/18 1200 04/25/18 1336 04/25/18 1552  BP: (!) 130/55 (!) 121/46 (!) 134/46 (!) 118/44  Pulse: 73 64 67 69  Resp: 20 19    Temp:   98.4 F (36.9 C) 98.7 F (37.1 C)  TempSrc:   Oral Oral  SpO2: 99% 99% 99% 99%  Weight:      Height:       No intake or output data in the 24 hours ending 04/25/18 1635  Physical Exam: A&Ox3, NAD CV: RRR Pulmonary: CTA Bilaterally Abdomen: Soft, Nontender, Nondistended Right groin: No signs of swelling, drainage, infection. Vascular: Warm, nontender, nondistended   Laboratory: CBC    Component Value Date/Time   WBC 20.9 (H) 04/25/2018 0726   HGB 8.3 (L) 04/25/2018 0726   HGB 12.7 05/23/2014 1603   HCT 24.4 (L) 04/25/2018 0726   HCT 39.0 05/23/2014 1603   PLT 136 (L) 04/25/2018 0726   PLT 200 05/23/2014 1603   BMET    Component Value Date/Time   NA 143 04/25/2018 0726   NA 141 05/23/2014 1603   K 3.3 (L) 04/25/2018 0726   K 3.8 05/23/2014 1603   CL 117 (H) 04/25/2018 0726   CL 109 (H) 05/23/2014 1603   CO2 18 (L) 04/25/2018 0726   CO2 26  05/23/2014 1603   GLUCOSE 72 04/25/2018 0726   GLUCOSE 165 (H) 05/23/2014 1603   BUN 22 (H) 04/25/2018 0726   BUN 26 (H) 05/23/2014 1603   CREATININE 0.91 04/25/2018 0726   CREATININE 1.02 05/23/2014 1603   CREATININE 0.98 06/07/2011 1015   CALCIUM 7.4 (L) 04/25/2018 0726   CALCIUM 8.9 05/23/2014 1603   GFRNONAA 52 (L) 04/25/2018 0726   GFRNONAA 48 (L) 05/23/2014 1603   GFRAA 60 (L) 04/25/2018 0726   GFRAA 55 (L) 05/23/2014 1603   Assessment/Planning: 84 year old female status post colonic embolization postop day #3. 1) patient and nurse report no additional bloody bowel movements. 2) patient has been transferred out of the ICU to the surgical floor 3) hemoglobin stable 4) At this time, vascular surgery will sign off.  If the patient continues to experience any type of bleeding she is at high risk for colonic perforation with a second attempt at embolization.  Would recommend consulting general surgery for resection.  Please reconsult as necessary. 5) patient does not need to follow-up as an outpatient.  Cleda Daub PA-C 04/25/2018 4:35 PM

## 2018-04-25 NOTE — Progress Notes (Signed)
Patient resting in bed, call light at bedside. Continue to monitor.

## 2018-04-25 NOTE — Progress Notes (Signed)
[facility] [place] at [facility]   PATIENT NAME: Christina [name]    MR#:  [id number]  DATE OF BIRTH:  1920-08-18  SUBJECTIVE:  CHIEF COMPLAINT:   Chief Complaint  Patient presents with  . Rectal Bleeding   -no further bleeding.  Received 4 units transfusion since admission. -Complains of weakness and dizziness -repeat Bleeding scan is negative  REVIEW OF SYSTEMS:  Review of Systems  Constitutional: Positive for malaise/fatigue. Negative for chills and fever.  HENT: Positive for hearing loss. Negative for congestion, ear discharge and nosebleeds.   Respiratory: Negative for cough, shortness of breath and wheezing.   Cardiovascular: Negative for chest pain and palpitations.  Gastrointestinal: Negative for abdominal pain, blood in stool, constipation, diarrhea, nausea and vomiting.  Genitourinary: Negative for dysuria.  Neurological: Positive for dizziness. Negative for focal weakness, seizures, weakness and headaches.    DRUG ALLERGIES:   Allergies  Allergen Reactions  . Bee Venom Rash  . Plavix [Clopidogrel] Rash  . Atorvastatin Other (See Comments)    Reaction:  Blisters on feet   . Diltiazem Hcl Other (See Comments)    Reaction:  Unknown   . Dye Fdc Red [Red Dye] Other (See Comments)    Reaction:  Unknown   . Gemfibrozil Other (See Comments)    Reaction:  Unknown   . Niacin Other (See Comments)    Reaction:  Unknown   . Sulfasalazine Rash    VITALS:  Blood pressure (!) 159/50, pulse 69, temperature 97.6 F (36.4 C), temperature source Axillary, resp. rate 18, height  (1.499 m), weight 47.3 kg (104 lb 4.4 oz), SpO2 96 %.  PHYSICAL EXAMINATION:  Physical Exam   GENERAL:  84 y.o.-year-old patient lying in the bed with no acute distress.  EYES: Pupils equal, round, reactive to light and accommodation. No scleral icterus. Extraocular muscles intact.  HEENT: Head atraumatic, normocephalic. Oropharynx and nasopharynx clear.  NECK:  Supple, no  jugular venous distention. No thyroid enlargement, no tenderness.  LUNGS: Normal breath sounds bilaterally, no wheezing, rales,rhonchi or crepitation. No use of accessory muscles of respiration. Decreased bibasilar breath sounds CARDIOVASCULAR: S1, S2 normal. No  rubs, or gallops. 3/6 systolic murmur present ABDOMEN: Soft, nontender, nondistended. Bowel sounds present. No organomegaly or mass.  EXTREMITIES: No pedal edema, cyanosis, or clubbing.  NEUROLOGIC: Cranial nerves II through XII are intact. Muscle strength 5/5 in all extremities. Sensation intact. Gait not checked. Global weakness noted. PSYCHIATRIC: The patient is alert and oriented x 3.  SKIN: No obvious rash, lesion, or ulcer.    LABORATORY PANEL:   CBC Recent Labs  Lab 04/25/18 0726  WBC 20.9*  HGB 8.3*  HCT 24.4*  PLT 136*   ------------------------------------------------------------------------------------------------------------------  Chemistries  Recent Labs  Lab 04/21/18 2045  04/25/18 0726  NA 141   < > 143  K 3.3*   < > 3.3*  CL 110   < > 117*  CO2 25   < > 18*  GLUCOSE 139*   < > 72  BUN 21*   < > 22*  CREATININE 0.98   < > 0.91  CALCIUM 9.0   < > 7.4*  AST 21  --   --   ALT 16  --   --   ALKPHOS 54  --   --   BILITOT 0.5  --   --    < > = values in this interval not displayed.   ------------------------------------------------------------------------------------------------------------------  Cardiac Enzymes Recent Labs  Lab 04/22/18  0740  TROPONINI <0.03   ------------------------------------------------------------------------------------------------------------------  RADIOLOGY:  Nm Gi Blood Loss  Result Date: 04/24/2018 CLINICAL DATA:  Lower GI bleed, bloody bowel movements, anemia EXAM: NUCLEAR MEDICINE GASTROINTESTINAL BLEEDING SCAN TECHNIQUE: Sequential abdominal images were obtained following intravenous administration of Tc-74m labeled red blood cells. RADIOPHARMACEUTICALS:   Twenty-two mCi Tc-98m pertechnetate in-vitro labeled red cells. COMPARISON:  04/22/2018 FINDINGS: Normal physiologic distribution of the radiotracer. No active or acute GI bleeding demonstrated during the 2 hours of imaging. Normal vascular distribution. IMPRESSION: Negative for acute or active gastrointestinal bleeding. Electronically Signed   By: Judie Petit.  Shick M.D.   On: 04/24/2018 11:18    EKG:   Orders placed or performed during the hospital encounter of 04/21/18  . EKG 12-Lead  . EKG 12-Lead  . EKG 12-Lead  . EKG 12-Lead  . EKG 12-Lead  . EKG 12-Lead    ASSESSMENT AND PLAN:   84 year old female with past medical history significant for CAD status post PCI, carotid stenosis, hypertension, ischemic cardiomyopathy EF of 25%, osteoporosis, paroxysmal atrial fibrillation not on anticoagulation who states that home with her daughter was brought in secondary to rectal bleed,  1.  GI bleed-lower GI, diverticular bleed -had bloody bowel movements with vasovagal syncope presentation. -  Bleeding scan was positive then for active bleed in the descending colon region. S/p mesenteric angiogram and embolization of splenic flexure branch of IMA and sigmoidal branch of IMA. -more bleeding after the procedure with drop in Hb and received 2 more units PRBC -Repeat bleeding scan is negative.  Hemoglobin is stable.  No further bleeding -GI has been consulted -Monitoring of hemoglobin.  Transfuse as needed -Started  on clear liquid diet  2.  Hypotension-hemorrhagic shock.  Improved after transfusion and stopping GI bleed -Blood pressure is stable at this time -Transfuse as needed  3. Leukocytosis- stress margination vs infection -Urine analysis is normal.  Will get a chest x-ray  4.  CAD-hold aspirin due to GI bleed.  Low-dose Coreg added.  5.  A. fib-continue amiodarone. low-dose Coreg as blood pressure is stable Hold aspirin for now  6.  DVT prophylaxis-teds and SCDs only   Discussed with    ICU attending If stable, consider transfer to floor and physical therapy consult.   All the records are reviewed and case discussed with Care Management/Social Workerr. Management plans discussed with the patient, family and they are in agreement.  CODE STATUS: DNR  TOTAL  TIME SPENT IN TAKING CARE OF THIS PATIENT: 38 minutes.   POSSIBLE D/C IN 1-2 DAYS, DEPENDING ON CLINICAL CONDITION.   Enid Baas M.D on 04/25/2018 at 11:11 AM  Between 7am to 6pm - Pager - [phone]  After 6pm go to [email or web address] - Social research officer, government  [facility]  Office  [phone]  CC: Primary care physician; Karie Schwalbe, MD

## 2018-04-25 NOTE — Progress Notes (Signed)
Follow up - Critical Care Medicine Note  Patient Details:    Christina [name] is an 84 y.o. female.with a past medical history remarkable for coronary artery disease, carotid stenosis, chronic back pain, heart failure with reduced ejection fraction at 20-25%, hypertension, hyperlipidemia, valvular heart disease, neuropathy, paroxysmal atrial fibrillation, peripheral vascular disease, S/P angiogram and embolization.   Lines, Airways, Drains: External Urinary Catheter (Active)    Anti-infectives:  Anti-infectives (From admission, onward)   Start     Dose/Rate Route Frequency Ordered Stop   04/22/18 1141  ceFAZolin (ANCEF) 2-4 GM/100ML-% IVPB    Note to Pharmacy:  Christina [name]  : cabinet override      04/22/18 1141 04/22/18 2344   04/22/18 1056  ceFAZolin (ANCEF) IVPB 2g/100 mL premix  Status:  Discontinued     2 g 200 mL/hr over 30 Minutes Intravenous 30 min pre-op 04/22/18 1056 04/22/18 1900      Microbiology: Results for orders placed or performed during the hospital encounter of 04/21/18  MRSA PCR Screening     Status: None   Collection Time: 04/22/18  2:50 PM  Result Value Ref Range Status   MRSA by PCR NEGATIVE NEGATIVE Final    Comment:        The GeneXpert MRSA Assay (FDA approved for NASAL specimens only), is one component of a comprehensive MRSA colonization surveillance program. It is not intended to diagnose MRSA infection nor to guide or monitor treatment for MRSA infections. Performed at [facility], [address] [place], [place] [address]      Studies: Nm Gi Blood Loss  Result Date: 04/24/2018 CLINICAL DATA:  Lower GI bleed, bloody bowel movements, anemia EXAM: NUCLEAR MEDICINE GASTROINTESTINAL BLEEDING SCAN TECHNIQUE: Sequential abdominal images were obtained following intravenous administration of Tc-34m labeled red blood cells. RADIOPHARMACEUTICALS:  Twenty-two mCi Tc-43m pertechnetate in-vitro labeled red cells. COMPARISON:  04/22/2018  FINDINGS: Normal physiologic distribution of the radiotracer. No active or acute GI bleeding demonstrated during the 2 hours of imaging. Normal vascular distribution. IMPRESSION: Negative for acute or active gastrointestinal bleeding. Electronically Signed   By: Judie Petit.  Shick M.D.   On: 04/24/2018 11:18   Nm Gi Blood Loss  Result Date: 04/22/2018 CLINICAL DATA:  Hypotensive, bright red blood per rectum EXAM: NUCLEAR MEDICINE GASTROINTESTINAL BLEEDING SCAN TECHNIQUE: Sequential abdominal images were obtained following intravenous administration of Tc-62m labeled red blood cells. RADIOPHARMACEUTICALS:  23.63 mCi Tc-67m pertechnetate in-vitro labeled red cells. COMPARISON:  None. FINDINGS: Examination was prematurely terminated given the active bleeding. Normal physiologic biodistribution of the radiotracer is demonstrated throughout the abdomen. Focal progressively increasing radiotracer activity in the mid descending colon which peristalsis into the distal descending colon and sigmoid colon and also retrograde into the splenic flexure. Findings are consistent with active gastrointestinal bleeding likely originating in the mid descending colon. IMPRESSION: Findings are consistent with active gastrointestinal bleeding likely originating in the mid descending colon. Critical Value/emergent results were called by telephone at the time of interpretation on 04/22/2018 at 10:30 am to Dr. Enid Baas , who verbally acknowledged these results. Electronically Signed   By: Elige Ko   On: 04/22/2018 10:30    Consults: Treatment Team:  Annice Needy, MD   Subjective:    Overnight Issues: patient had an additional episode of bleeding with decreasing hemoglobin to 7.2, status post bleeding scan which did not show any active bleeding and additional transfusion of blood. Most recent hemoglobin was 8.3  Objective:  Vital signs for last 24 hours: Temp:  [  97.6 F (36.4 C)-98 F (36.7 C)] 97.6 F (36.4 C) (05/09  0000) Pulse Rate:  [69-80] 70 (05/09 0300) Resp:  [14-20] 17 (05/09 0300) BP: (96-139)/(34-116) 113/43 (05/09 0300) SpO2:  [97 %-100 %] 98 % (05/09 0300) Weight:  [104 lb 4.4 oz (47.3 kg)] 104 lb 4.4 oz (47.3 kg) (05/09 0332)  Hemodynamic parameters for last 24 hours:    Intake/Output from previous day: 05/08 0701 - 05/09 0700 In: 310 [Blood:310] Out: 450 [Urine:450]  Intake/Output this shift: No intake/output data recorded.  Vent settings for last 24 hours:    Physical Exam:  Patient is awake, alert, oriented in no acute distress HEENT: No oral lesions appreciated, trachea is midline, Cardiovascular: Regular rate and rhythm Pulmonary: Clear to auscultation Abdominal: Positive bowel sounds, soft exam Extremities: No clubbing cyanosis or edema noted Neurologic: Patient moves all extremities  Assessment/Plan:   Lower GI bleed, status post embolization. Had an additional episode of bleeding yesterday morning, repeat bleeding scan was negative. So far no additional episode, stable hemodynamics, hemoglobin has increased to 8.3. Limited therapeutics as she is a poor surgical candidate, difficult to re-embolize as this may result in bowel ischemia. Talk with her and family treating conservatively at this time.  Hemoglobin remained stable transfer to the floor  .Christina [name] 04/25/2018  *Care during the described time interval was provided by me and/or other providers on the critical care team.  I have reviewed this patient's available data, including medical history, events of note, physical examination and test results as part of my evaluation. Patient ID: Christina [name], female   DOB: Nov 21, 1920, 84 y.o.   MRN: [id number] Patient ID: Christina [name], female   DOB: 1920/02/25, 85 y.o.   MRN: [id number]

## 2018-04-25 NOTE — Evaluation (Signed)
Physical Therapy Evaluation Patient Details Name: Christina [name] MRN: [id number] DOB: 04-19-1920 Today's Date: 04/25/2018   History of Present Illness  84 y/o female here with lower GI bleed.  Has gotten multiple transfusions and H&H are now stable.    Clinical Impression  Pt showed very good effort with PT exam and though she was initially hesitant to do more than simple in-bed activity she did agree to get to sitting and ultimately was able to stand and walk forward/back ~4 ft X 2 with relative ease, good safety and only minimal fatigue.  Pt should be able to return home with daughter once medically stable.      Follow Up Recommendations Home health PT    Equipment Recommendations    daughter asked about a transport chair (they do have a w/c)   Recommendations for Other Services       Precautions / Restrictions Precautions Precautions: Fall Restrictions Weight Bearing Restrictions: No      Mobility  Bed Mobility Overal bed mobility: Needs Assistance Bed Mobility: Supine to Sit;Sit to Supine     Supine to sit: Min guard;Min assist Sit to supine: Min guard   General bed mobility comments: Pt did well with getting to EOB; she was able to scoot hips to side, roll to side and needed only very slight assist with UEs to rise to sitting  Transfers Overall transfer level: Modified independent Equipment used: Rolling walker (2 wheeled)             General transfer comment: Pt able to rise to standing w/o direct assist (using walker)  Ambulation/Gait Ambulation/Gait assistance: Min guard Ambulation Distance (Feet): 8 Feet Assistive device: Rolling walker (2 wheeled)       General Gait Details: Pt able to go forward and back ~4 ft x 2.  She had some fatigue with the effort but ultimately did very well and showed good safety and relative confidence.  Stairs            Wheelchair Mobility    Modified Rankin (Stroke Patients Only)       Balance Overall balance  assessment: Modified Independent                                           Pertinent Vitals/Pain Pain Assessment: No/denies pain    Home Living Family/patient expects to be discharged to:: Private residence Living Arrangements: Children Available Help at Discharge: Available 24 hours/day;Family   Home Access: Stairs to enter   Entrance Stairs-Number of Steps: 3   Home Equipment: Bedside commode;Shower seat;Walker - 2 wheels;Walker - 4 wheels;Grab bars - tub/shower      Prior Function Level of Independence: Needs assistance   Gait / Transfers Assistance Needed: Pt able to ambulate in the home (generally uses rollator) but also will furniture walk  ADL's / Homemaking Assistance Needed: Pt is able to dress self, daughter helps with sponge bath        Hand Dominance        Extremity/Trunk Assessment   Upper Extremity Assessment Upper Extremity Assessment: Generalized weakness(age appropriate limitations, generally WFL)    Lower Extremity Assessment Lower Extremity Assessment: Generalized weakness(age appropraite limitations, generally WFL)       Communication   Communication: No difficulties  Cognition Arousal/Alertness: Awake/alert Behavior During Therapy: WFL for tasks assessed/performed Overall Cognitive Status: Within Functional Limits for tasks assessed  General Comments      Exercises     Assessment/Plan    PT Assessment Patient needs continued PT services  PT Problem List Decreased strength;Decreased range of motion;Decreased activity tolerance;Decreased balance;Decreased mobility;Decreased coordination;Decreased knowledge of use of DME;Decreased safety awareness       PT Treatment Interventions DME instruction;Gait training;Stair training;Functional mobility training;Therapeutic activities;Therapeutic exercise;Balance training;Neuromuscular re-education;Patient/family education     PT Goals (Current goals can be found in the Care Plan section)  Acute Rehab PT Goals Patient Stated Goal: go home, be able to water her flowers PT Goal Formulation: With patient Time For Goal Achievement: 05/09/18 Potential to Achieve Goals: Good    Frequency Min 2X/week   Barriers to discharge        Co-evaluation               AM-PAC PT "6 Clicks" Daily Activity  Outcome Measure Difficulty turning over in bed (including adjusting bedclothes, sheets and blankets)?: A Little Difficulty moving from lying on back to sitting on the side of the bed? : A Lot Difficulty sitting down on and standing up from a chair with arms (e.g., wheelchair, bedside commode, etc,.)?: A Little Help needed moving to and from a bed to chair (including a wheelchair)?: A Little Help needed walking in hospital room?: A Little Help needed climbing 3-5 steps with a railing? : A Lot 6 Click Score: 16    End of Session Equipment Utilized During Treatment: Gait belt Activity Tolerance: Patient tolerated treatment well Patient left: with bed alarm set;with call bell/phone within reach;with family/visitor present Nurse Communication: Mobility status PT Visit Diagnosis: Difficulty in walking, not elsewhere classified (R26.2);Muscle weakness (generalized) (M62.81)    Time: 1610-9604 PT Time Calculation (min) (ACUTE ONLY): 22 min   Charges:   PT Evaluation $PT Eval Low Complexity: 1 Low     PT G Codes:        Malachi Pro, DPT 04/25/2018, 5:35 PM

## 2018-04-25 NOTE — Progress Notes (Signed)
Pharmacy Electrolyte Monitoring Consult:  Pharmacy consulted to assist in monitoring and replacing electrolytes in this 84 y.o. female admitted on 04/21/2018 with Rectal Bleeding   Labs:  Sodium (mmol/L)  Date Value  04/25/2018 143  05/23/2014 141   Potassium (mmol/L)  Date Value  04/25/2018 3.3 (L)  05/23/2014 3.8   Magnesium (mg/dL)  Date Value  40/98/1191 2.4   Phosphorus (mg/dL)  Date Value  47/82/9562 4.1   Calcium (mg/dL)  Date Value  13/07/6577 7.4 (L)   Calcium, Total (mg/dL)  Date Value  46/96/2952 8.9   Albumin (g/dL)  Date Value  84/13/2440 3.3 (L)    Assessment/Plan: Will replace Potassium 40 mEq PO x 1. Will recheck electrolytes with am labs.   Pharmacy will continue to monitor and adjust per consult.   Jeanpaul Biehl L 04/25/2018 1:23 PM

## 2018-04-25 NOTE — Progress Notes (Signed)
   04/25/18 1050  Clinical Encounter Type  Visited With Patient not available  Visit Type Follow-up  Spiritual Encounters  Spiritual Needs Prayer   Chaplain attempted follow up with patient.  Palliative care staff currently meeting with patient.  Chaplain offered silent prayer outside of room.

## 2018-04-26 ENCOUNTER — Telehealth: Payer: Self-pay | Admitting: Internal Medicine

## 2018-04-26 LAB — CBC
HCT: 22.1 % — ABNORMAL LOW (ref 35.0–47.0)
Hemoglobin: 7.5 g/dL — ABNORMAL LOW (ref 12.0–16.0)
MCH: 29.6 pg (ref 26.0–34.0)
MCHC: 33.8 g/dL (ref 32.0–36.0)
MCV: 87.7 fL (ref 80.0–100.0)
PLATELETS: 157 10*3/uL (ref 150–440)
RBC: 2.52 MIL/uL — AB (ref 3.80–5.20)
RDW: 19.6 % — ABNORMAL HIGH (ref 11.5–14.5)
WBC: 14.2 10*3/uL — AB (ref 3.6–11.0)

## 2018-04-26 LAB — BASIC METABOLIC PANEL
ANION GAP: 4 — AB (ref 5–15)
BUN: 17 mg/dL (ref 6–20)
CALCIUM: 7.4 mg/dL — AB (ref 8.9–10.3)
CHLORIDE: 115 mmol/L — AB (ref 101–111)
CO2: 21 mmol/L — AB (ref 22–32)
Creatinine, Ser: 0.86 mg/dL (ref 0.44–1.00)
GFR calc non Af Amer: 55 mL/min — ABNORMAL LOW (ref >60)
GLUCOSE: 95 mg/dL (ref 65–99)
POTASSIUM: 3.6 mmol/L (ref 3.5–5.1)
Sodium: 140 mmol/L (ref 135–145)

## 2018-04-26 MED ORDER — FERROUS SULFATE 325 (65 FE) MG PO TABS
325.0000 mg | ORAL_TABLET | Freq: Every day | ORAL | Status: DC
  Administered 2018-04-26: 325 mg via ORAL
  Filled 2018-04-26: qty 1

## 2018-04-26 MED ORDER — DOCUSATE SODIUM 100 MG PO CAPS: 100.0000 mg | ORAL_CAPSULE | Freq: Two times a day (BID) | ORAL | 0 refills | Status: DC

## 2018-04-26 MED ORDER — MORPHINE SULFATE (CONCENTRATE) 10 MG /0.5 ML PO SOLN: 5.0000 mg | ORAL | 0 refills | Status: AC | PRN

## 2018-04-26 MED ORDER — FERROUS SULFATE 325 (65 FE) MG PO TABS: 325.0000 mg | ORAL_TABLET | Freq: Every day | ORAL | 3 refills | Status: DC

## 2018-04-26 NOTE — Care Management (Signed)
Home health cancelled and [facility] to care for patient at home

## 2018-04-26 NOTE — Telephone Encounter (Signed)
Copied from CRM [id number]. Topic: Quick Communication - See Telephone Encounter >> Apr 26, 2018  2:11 PM Cipriano Bunker wrote: CRM for notification. See Telephone encounter for: 04/26/18.  She is being discharged today from [facility] and the family is asking for Hospice to start this weekend and need verbal hospice services today [facility] [phone]

## 2018-04-26 NOTE — Progress Notes (Signed)
Daily Progress Note   Patient Name: Christina [name]       Date: 04/26/2018 DOB: 05-Mar-1920  Age: 84 y.o. MRN#: [id number] Attending Physician: Enid Baas, MD Primary Care Physician: Karie Schwalbe, MD Admit Date: 04/21/2018  Reason for Consultation/Follow-up: Establishing goals of care  Subjective:  Patient resting in bed. Son and other family members are at bedside.  [name] states she is ready to go home. She and her family state they would like to focus on comfort and would not want any surgeries. Upon discussing hospice, they tell me they are familiar and went through all this with her husband and their father 15 years ago. The patient and family states that if she can just get home and have a few weeks with her family and pets that will be enough. They hope her anemia will improve, but would like to prepare for the worst.    Length of Stay: 5  Current Medications: Scheduled Meds:  . amiodarone  200 mg Oral Daily  . carvedilol  3.125 mg Oral BID WC  . ferrous sulfate  325 mg Oral Q breakfast  . pantoprazole  40 mg Oral Daily  . rosuvastatin  5 mg Oral Daily    Continuous Infusions: . sodium chloride Stopped (04/23/18 2134)  . sodium chloride 250 mL/hr at 04/24/18 0600    PRN Meds: acetaminophen **OR** acetaminophen, diphenhydrAMINE, morphine injection, ondansetron **OR** ondansetron (ZOFRAN) IV  Physical Exam  Constitutional: No distress.  Pulmonary/Chest: Effort normal.  Neurological: She is alert.            Vital Signs: BP (!) 144/50 (BP Location: Right Arm)   Pulse 67   Temp 98.7 F (37.1 C) (Oral)   Resp 19   Ht  (1.499 m)   Wt 48.3 kg (106 lb 8 oz)   SpO2 96%   BMI 21.51 kg/m  SpO2: SpO2: 96 % O2 Device: O2 Device: Room Air O2 Flow Rate: O2  Flow Rate (L/min): 2 L/min  Intake/output summary: No intake or output data in the 24 hours ending 04/26/18 1130 LBM: Last BM Date: 04/25/18 Baseline Weight: Weight: 48.1 kg (106 lb) Most recent weight: Weight: 48.3 kg (106 lb 8 oz)       Palliative Assessment/Data: 40%     Patient Active Problem List  Diagnosis Date Noted  . Palliative care encounter   . Goals of care, counseling/discussion   . Protein-calorie malnutrition, severe 04/24/2018  . BRBPR (bright red blood per rectum) 04/21/2018  . Paroxysmal atrial fibrillation (HCC) 09/05/2017  . Chronic systolic heart failure (HCC) 09/04/2017  . S/p left hip fracture 08/08/2017  . Vitamin D deficiency 07/23/2017  . Vitamin B12 deficiency 07/23/2017  . Mood disorder (HCC) 09/06/2015  . Back pain without sciatica 07/26/2015  . Osteoarthritis, multiple sites 03/04/2015  . Advanced directives, counseling/discussion 09/04/2014  . Mild cognitive impairment 09/04/2014  . Routine general medical examination at a health care facility 08/26/2013  . Carotid arterial disease (HCC)   . Hyperlipidemia 12/04/2007  . Essential hypertension 06/04/2007  . Coronary artery disease 06/04/2007  . Osteoporosis 06/04/2007    Palliative Care Assessment & Plan   Patient Profile:  84 y.o. female  with past medical history of PVD, paroxysmal atrial fibrillation, CAD s/p MI last September, chronic constipation, malnutrition, multiple falls (pelvic fracture, left then right hip fractures) who was admitted on 04/21/2018 with large volume rectal bleeding.   An active bleed was seen in the left colon on NM bleeding scan. She underwent angiogram and embolization of portions of her IMA on 04/22/2018.  On the night of 5/7 - 5/8 she bled once again.  This time the bleeding scan was not positive.  Vascular surgery recommended against embolization again for fear of causing bowel infarct.   Assessment/Recommendations/Plan:  Home with hospice.    Code Status:      Code Status Orders  (From admission, onward)        Start     Ordered   04/22/18 0020  Do not attempt resuscitation (DNR)  Continuous    Question Answer Comment  In the event of cardiac or respiratory ARREST Do not call a "code blue"   In the event of cardiac or respiratory ARREST Do not perform Intubation, CPR, defibrillation or ACLS   In the event of cardiac or respiratory ARREST Use medication by any route, position, wound care, and other measures to relive pain and suffering. May use oxygen, suction and manual treatment of airway obstruction as needed for comfort.      04/22/18 0019    Code Status History    Date Active Date Inactive Code Status Order ID Comments User Context   08/24/2017 1605 08/27/2017 1929 DNR [id number]  Milagros Loll, MD ED   06/18/2017 1543 06/20/2017 1716 DNR [id number]  Enid Baas, MD ED   05/17/2016 1517 05/22/2016 2105 DNR [id number]  Ramonita Lab, MD Inpatient   05/17/2016 1243 05/17/2016 1243 Full Code [id number]  [name], MD ED   07/02/2015 1350 07/03/2015 1533 DNR [id number]  Altamese Dilling, MD Inpatient   07/02/2015 1129 07/02/2015 1350 Full Code [id number]  Altamese Dilling, MD Inpatient   06/09/2015 1835 06/11/2015 1450 DNR [id number]  Auburn Bilberry, MD Inpatient    Advance Directive Documentation     Most Recent Value  Type of Advance Directive  Healthcare Power of Attorney, Out of facility DNR (pink MOST or yellow form)  Pre-existing out of facility DNR order (yellow form or pink MOST form)  -  "MOST" Form in Place?  -       Prognosis: Another large bleed will take her life quickly as she does not want operative intervention.  If she stops bleeding her prognosis is likely in months given advanced age, frailty, frequent falls, malnutrition, CHF (EF 20%) and goals of care.   Discharge  Planning:  Home with Hospice  Care plan was discussed with Dr. Nemiah Commander  Thank you for allowing the Palliative Medicine Team to assist in the  care of this patient.   Total Time 35 min Prolonged Time Billed No      Greater than 50%  of this time was spent counseling and coordinating care related to the above assessment and plan.  Morton Stall, NP  Please contact Palliative Medicine Team phone at [phone] for questions and concerns.

## 2018-04-26 NOTE — Care Management Note (Signed)
Case Management Note  Patient Details  Name: KILEEN LANGE MRN: [id number] Date of Birth: 11-Sep-1920  Subjective/Objective:  Met with patient, and her daughter and son at bedside to discuss discharge planning and PT recommendations. Daughter prefers to use [facility]. Referral to [facility] for PT and HHA. No DME needs. PCP is Dr. Silvio Pate.                   Action/Plan: [facility] for PT and HHA.   Expected Discharge Date:  04/24/18               Expected Discharge Plan:  [place]  In-House Referral:     Discharge planning Services  CM Consult  Post Acute Care Choice:  Home Health Choice offered to:  Adult Children  DME Arranged:    DME Agency:     HH Arranged:  PT, Nurse's Aide [place] Agency:  [facility] (formerly [employer])  Status of Service:  In process, will continue to follow  If discussed at Long Length of Stay Meetings, dates discussed:    Additional Comments:  Jolly Mango, RN 04/26/2018, 10:14 AM

## 2018-04-26 NOTE — Care Management Important Message (Signed)
Copy of signed IM left in patient's room.    

## 2018-04-26 NOTE — Telephone Encounter (Signed)
Dr Alphonsus Sias out of office will fwd to Pamala Hurry NP; Hospice attending physicians order is in [place] NP in box.

## 2018-04-26 NOTE — Discharge Planning (Signed)
Patient's IV removed. RN assessment and VS revealed stability for DC to home with hospice. Discharge papers given, explained and educated.  Informed of suggested FU w/ PCP and appt made.  Scripts printed, signed and given.  Once ready, will be wheeled to front and family transporting home via car.

## 2018-04-26 NOTE — Telephone Encounter (Signed)
Signed, given back to [place] to fax back

## 2018-04-26 NOTE — Discharge Summary (Signed)
[facility] [place] [facility]   PATIENT NAME: Christina [name]    MR#:  [id number]  DATE OF BIRTH:  26-Apr-1920  DATE OF ADMISSION:  04/21/2018   ADMITTING PHYSICIAN: Oralia Manis, MD  DATE OF DISCHARGE:  04/26/18  PRIMARY CARE PHYSICIAN: Karie Schwalbe, MD   ADMISSION DIAGNOSIS:   Rectal bleeding [K62.5]  DISCHARGE DIAGNOSIS:   Principal Problem:   BRBPR (bright red blood per rectum) Active Problems:   Hyperlipidemia   Essential hypertension   Coronary artery disease   Chronic systolic heart failure (HCC)   Paroxysmal atrial fibrillation (HCC)   Protein-calorie malnutrition, severe   Palliative care encounter   Goals of care, counseling/discussion   SECONDARY DIAGNOSIS:   Past Medical History:  Diagnosis Date  . CAD (coronary artery disease)    a. S/P previous MI;  b. 04/2007 Cath/PCI: Taxus DES' to mid/distal RCA and RPDA as well as Ramus;  c. myoview 8/09: EF 78%, normal perfusion; d. 12/2012 Low risk MV; e. 08/2017 NSTEMI->Med Rx.  . Carotid stenosis    Bilateral, mild to moderate  . Chronic back pain   . Chronic shoulder pain   . HFrEF (heart failure with reduced ejection fraction) (HCC)    a. 08/2017 Echo: EF 20-25%, mid-apicalanteroseptal, ant, antlat, and apical sev HK. Mild MR, mildly dil LA, nl RV fxn.  . Hyperlipidemia    Intolerant of many statins  . Hypertension   . Ischemic cardiomyopathy    a. 08/2017 Echo: EF 20-25%, mid-apicalanteroseptal, ant, antlat, and apical sev HK.  . Mild mitral and aortic regurgitation    a. 11/2011 Echo: EF 55-65%, No RWMA, Gr 1 DD, Mild AI/MR; b. 08/2017 Echo: Mild MR.  [facility] Neuropathy   . Osteoarthritis   . Osteoporosis   . PAF (paroxysmal atrial fibrillation) (HCC)    a. 08/2017 - noted @ time of NSTEMI. CHA2DS2VASc = 5-->ASA only due to age and h/o falls;  c. On Amio.  [facility] Pelvic fracture (HCC) 6/16  . Peripheral vascular disease [facility])     HOSPITAL COURSE:   84 year old female with past medical  history significant for CAD status post PCI, carotid stenosis, hypertension, ischemic cardiomyopathy EF of 25%, osteoporosis, paroxysmal atrial fibrillation not on anticoagulation who states that home with her daughter was brought in secondary to rectal bleed,  1.  GI bleed-lower GI, diverticular bleed -had bloody bowel movements with vasovagal syncope presentation. -  Bleeding scan was positive then for active bleed in the descending colon region. S/p mesenteric angiogram and embolization of splenic flexure branch of IMA and sigmoidal branch of IMA. -more bleeding after the procedure with drop in Hb and received 2 more units PRBC -Repeat bleeding scan is negative.  Hemoglobin is stable.  No further bleeding -GI has been consulted - hb at 7.5 today, no transfusion for now - recommended iron supplements at discharge  2.  Hypotension-hemorrhagic shock.  Improved after transfusion and stopping GI bleed -Blood pressure is stable at this time  3. Leukocytosis- stress margination likely. Improving -Urine analysis is normal.  chest x-ray with no acute findings  4.  CAD-hold aspirin due to GI bleed.  Low-dose Coreg added.  5.  A. fib-continue amiodarone. low-dose Coreg as blood pressure is stable Hold aspirin at discharge  Worked decent with physical therapy, but family requested for home hospice.     DISCHARGE CONDITIONS:   Guarded  CONSULTS OBTAINED:   Treatment Team:  Annice Needy, MD  DRUG ALLERGIES:  Allergies  Allergen Reactions  . Bee Venom Rash  . Plavix [Clopidogrel] Rash  . Atorvastatin Other (See Comments)    Reaction:  Blisters on feet   . Diltiazem Hcl Other (See Comments)    Reaction:  Unknown   . Dye Fdc Red [Red Dye] Other (See Comments)    Reaction:  Unknown   . Gemfibrozil Other (See Comments)    Reaction:  Unknown   . Niacin Other (See Comments)    Reaction:  Unknown   . Sulfasalazine Rash   DISCHARGE MEDICATIONS:   Allergies as of 04/26/2018        Reactions   Bee Venom Rash   Plavix [clopidogrel] Rash   Atorvastatin Other (See Comments)   Reaction:  Blisters on feet    Diltiazem Hcl Other (See Comments)   Reaction:  Unknown    Dye Fdc Red [red Dye] Other (See Comments)   Reaction:  Unknown    Gemfibrozil Other (See Comments)   Reaction:  Unknown    Niacin Other (See Comments)   Reaction:  Unknown    Sulfasalazine Rash      Medication List    STOP taking these medications   aspirin EC 81 MG tablet   bisacodyl 5 MG EC tablet Commonly known as:  DULCOLAX   lisinopril 10 MG tablet Commonly known as:  PRINIVIL,ZESTRIL     TAKE these medications   amiodarone 200 MG tablet Commonly known as:  PACERONE Take 1 tablet (200 mg total) by mouth daily.   ASPERCREME LIDOCAINE 4 % Ptch Generic drug:  Lidocaine Apply 1 patch topically daily as needed (back pain). Apply to lower back/sacrum for pain. Remove after 12 hours.   carvedilol 3.125 MG tablet Commonly known as:  COREG TAKE 1 TABLET BY MOUTH TWO  TIMES DAILY   cyanocobalamin 1000 MCG tablet Take 1,000 mcg by mouth daily.   docusate sodium 100 MG capsule Commonly known as:  COLACE Take 1 capsule (100 mg total) by mouth 2 (two) times daily.   ferrous sulfate 325 (65 FE) MG tablet Take 1 tablet (325 mg total) by mouth daily with breakfast. Start taking on:  04/27/2018   ibandronate 150 MG tablet Commonly known as:  BONIVA Take 1 tablet (150 mg total) by mouth every 30 (thirty) days. What changed:  additional instructions   isosorbide mononitrate 30 MG 24 hr tablet Commonly known as:  IMDUR TAKE 1 TABLET BY MOUTH  DAILY   Krill Oil 350 MG Caps Take 350 mg by mouth daily.   morphine CONCENTRATE 10 mg / 0.5 ml concentrated solution Take 0.25-0.5 mLs (5-10 mg total) by mouth every 3 (three) hours as needed for severe pain or shortness of breath. What changed:  when to take this   MULTIVITAL tablet Take 1 tablet by mouth daily.   nitroGLYCERIN 0.4 MG SL  tablet Commonly known as:  NITROSTAT PLACE 1 TABLET (0.4 MG TOTAL) UNDER THE TONGUE EVERY 5 (FIVE) MINUTES AS NEEDED FOR CHEST PAIN.   rosuvastatin 5 MG tablet Commonly known as:  CRESTOR TAKE 1 TABLET BY MOUTH  DAILY   senna 8.6 MG Tabs tablet Commonly known as:  SENOKOT Take 1 tablet by mouth 2 (two) times daily.   traMADol 50 MG tablet Commonly known as:  ULTRAM TAKE 0.5-1 TABLETS (25-50 MG TOTAL) BY MOUTH 3 (THREE) TIMES DAILY AS NEEDED. What changed:  reasons to take this   TYLENOL 8 HOUR 650 MG CR tablet Generic drug:  acetaminophen Take 650-1,300 mg  by mouth every 8 (eight) hours as needed for pain. Do not exceed 3000 mg / 24 hrs of acetaminophen. Consider all sources   Vitamin D3 2000 units Tabs Take 2,000 Units by mouth daily.        DISCHARGE INSTRUCTIONS:   1. PCP f/u in 1 week 2. Hospice at home  DIET:   Renal diet  ACTIVITY:   Activity as tolerated  OXYGEN:   Home Oxygen: No.  Oxygen Delivery: room air  DISCHARGE LOCATION:   home   If you experience worsening of your admission symptoms, develop shortness of breath, life threatening emergency, suicidal or homicidal thoughts you must seek medical attention immediately by calling 911 or calling your MD immediately  if symptoms less severe.  You Must read complete instructions/literature along with all the possible adverse reactions/side effects for all the Medicines you take and that have been prescribed to you. Take any new Medicines after you have completely understood and accpet all the possible adverse reactions/side effects.   Please note  You were cared for by a hospitalist during your hospital stay. If you have any questions about your discharge medications or the care you received while you were in the hospital after you are discharged, you can call the unit and asked to speak with the hospitalist on call if the hospitalist that took care of you is not available. Once you are discharged, your  primary care physician will handle any further medical issues. Please note that NO REFILLS for any discharge medications will be authorized once you are discharged, as it is imperative that you return to your primary care physician (or establish a relationship with a primary care physician if you do not have one) for your aftercare needs so that they can reassess your need for medications and monitor your lab values.    On the day of Discharge:  VITAL SIGNS:   Blood pressure (!) 146/60, pulse 70, temperature 98.7 F (37.1 C), temperature source Oral, resp. rate 19, height  (1.499 m), weight 48.3 kg (106 lb 8 oz), SpO2 96 %.  PHYSICAL EXAMINATION:    GENERAL:  84 y.o.-year-old patient lying in the bed with no acute distress.  EYES: Pupils equal, round, reactive to light and accommodation. No scleral icterus. Extraocular muscles intact.  HEENT: Head atraumatic, normocephalic. Oropharynx and nasopharynx clear.  NECK:  Supple, no jugular venous distention. No thyroid enlargement, no tenderness.  LUNGS: Normal breath sounds bilaterally, no wheezing, rales,rhonchi or crepitation. No use of accessory muscles of respiration. Decreased bibasilar breath sounds CARDIOVASCULAR: S1, S2 normal. No  rubs, or gallops. 3/6 systolic murmur present ABDOMEN: Soft, nontender, nondistended. Bowel sounds present. No organomegaly or mass.  EXTREMITIES: No pedal edema, cyanosis, or clubbing.  NEUROLOGIC: Cranial nerves II through XII are intact. Muscle strength 5/5 in all extremities. Sensation intact. Gait not checked. Global weakness noted. PSYCHIATRIC: The patient is alert and oriented x 3.  SKIN: No obvious rash, lesion, or ulcer.    DATA REVIEW:   CBC Recent Labs  Lab 04/26/18 0330  WBC 14.2*  HGB 7.5*  HCT 22.1*  PLT 157    Chemistries  Recent Labs  Lab 04/21/18 2045  04/26/18 0330  NA 141   < > 140  K 3.3*   < > 3.6  CL 110   < > 115*  CO2 25   < > 21*  GLUCOSE 139*   < > 95  BUN  21*   < > 17  CREATININE  0.98   < > 0.86  CALCIUM 9.0   < > 7.4*  AST 21  --   --   ALT 16  --   --   ALKPHOS 54  --   --   BILITOT 0.5  --   --    < > = values in this interval not displayed.     Microbiology Results  Results for orders placed or performed during the hospital encounter of 04/21/18  MRSA PCR Screening     Status: None   Collection Time: 04/22/18  2:50 PM  Result Value Ref Range Status   MRSA by PCR NEGATIVE NEGATIVE Final    Comment:        The GeneXpert MRSA Assay (FDA approved for NASAL specimens only), is one component of a comprehensive MRSA colonization surveillance program. It is not intended to diagnose MRSA infection nor to guide or monitor treatment for MRSA infections. Performed at [facility], [address], [place], [place] [address]     RADIOLOGY:  No results found.   Management plans discussed with the patient, family and they are in agreement.  CODE STATUS:     Code Status Orders  (From admission, onward)        Start     Ordered   04/22/18 0020  Do not attempt resuscitation (DNR)  Continuous    Question Answer Comment  In the event of cardiac or respiratory ARREST Do not call a "code blue"   In the event of cardiac or respiratory ARREST Do not perform Intubation, CPR, defibrillation or ACLS   In the event of cardiac or respiratory ARREST Use medication by any route, position, wound care, and other measures to relive pain and suffering. May use oxygen, suction and manual treatment of airway obstruction as needed for comfort.      04/22/18 0019    Code Status History    Date Active Date Inactive Code Status Order ID Comments User Context   08/24/2017 1605 08/27/2017 1929 DNR [id number]  Milagros Loll, MD ED   06/18/2017 1543 06/20/2017 1716 DNR [id number]  Enid Baas, MD ED   05/17/2016 1517 05/22/2016 2105 DNR [id number]  Ramonita Lab, MD Inpatient   05/17/2016 1243 05/17/2016 1243 Full Code [id number]  [name],  MD ED   07/02/2015 1350 07/03/2015 1533 DNR [id number]  Altamese Dilling, MD Inpatient   07/02/2015 1129 07/02/2015 1350 Full Code [id number]  Altamese Dilling, MD Inpatient   06/09/2015 1835 06/11/2015 1450 DNR [id number]  Auburn Bilberry, MD Inpatient    Advance Directive Documentation     Most Recent Value  Type of Advance Directive  Healthcare Power of Attorney, Out of facility DNR (pink MOST or yellow form)  Pre-existing out of facility DNR order (yellow form or pink MOST form)  -  "MOST" Form in Place?  -      TOTAL TIME TAKING CARE OF THIS PATIENT: 38 minutes.    Tarence Searcy M.D on 04/26/2018 at 2:50 PM  Between 7am to 6pm - Pager - [phone]  After 6pm go to [email or web address] - Social research officer, government  [facility]  Office  [phone]  CC: Primary care physician; Karie Schwalbe, MD   Note: This dictation was prepared with Dragon dictation along with smaller phrase technology. Any transcriptional errors that result from this process are unintentional.

## 2018-04-26 NOTE — Progress Notes (Signed)
New referral for [facility] services at home received from [facility] following a  Palliative Medicine follow up visit. Patient has a known past medical history of PVD, paroxysmal atrial fibrillation, CAD s/p MI 9/18, chronic constipation, malnutrition, multiple falls (pelvic fracture, left then right hip fractures) who was admitted to [facility] from home on 04/21/2018 with large volume rectal bleeding. She was found to have a large bleed in the left colon and was admitted to the ICU. Vascular surgery was consulted and patient had an angiogram and embolization of the inferior mesenteric artery. She had another bleed on the night of 5/8.  Palliative medicine was consulted for goals of care and met with patient and her family over the last few days, with another meeting this morning. They have chosen to focus on comfort and discharge home with Hospice services. Writer met in the room with Mrs. Stansell, her son Rush Landmark and several grandchildren to initiate education regarding hospice services, philosophy and team approach to care with good understanding voiced. Plan is for discharge home via car today. Signed DNR to accompany patient.[facility] care team updated. Updated notes faxed to referral. Hospice information and contact numbers left with Mr. Halsey, [place] RN, BSN, [place] [facility] [place], hospital Liaison [phone]

## 2018-04-27 NOTE — Telephone Encounter (Signed)
Noted I will plan to make a home visit in the next couple of weeks Let daughter know I will tentatively plan to come 5/22

## 2018-04-29 ENCOUNTER — Telehealth: Payer: Self-pay

## 2018-04-29 DIAGNOSIS — R627 Adult failure to thrive: Secondary | ICD-10-CM | POA: Diagnosis not present

## 2018-04-29 DIAGNOSIS — Z8781 Personal history of (healed) traumatic fracture: Secondary | ICD-10-CM | POA: Diagnosis not present

## 2018-04-29 DIAGNOSIS — Z6820 Body mass index (BMI) 20.0-20.9, adult: Secondary | ICD-10-CM | POA: Diagnosis not present

## 2018-04-29 DIAGNOSIS — I252 Old myocardial infarction: Secondary | ICD-10-CM | POA: Diagnosis not present

## 2018-04-29 DIAGNOSIS — G4733 Obstructive sleep apnea (adult) (pediatric): Secondary | ICD-10-CM | POA: Diagnosis not present

## 2018-04-29 DIAGNOSIS — I110 Hypertensive heart disease with heart failure: Secondary | ICD-10-CM | POA: Diagnosis not present

## 2018-04-29 DIAGNOSIS — R634 Abnormal weight loss: Secondary | ICD-10-CM | POA: Diagnosis not present

## 2018-04-29 DIAGNOSIS — I5020 Unspecified systolic (congestive) heart failure: Secondary | ICD-10-CM | POA: Diagnosis not present

## 2018-04-29 DIAGNOSIS — I480 Paroxysmal atrial fibrillation: Secondary | ICD-10-CM | POA: Diagnosis not present

## 2018-04-29 DIAGNOSIS — I2510 Atherosclerotic heart disease of native coronary artery without angina pectoris: Secondary | ICD-10-CM | POA: Diagnosis not present

## 2018-04-29 DIAGNOSIS — D500 Iron deficiency anemia secondary to blood loss (chronic): Secondary | ICD-10-CM | POA: Diagnosis not present

## 2018-04-29 NOTE — Telephone Encounter (Signed)
Note: patient has a hospital follow up visit scheduled here in the clinic for 5/15.

## 2018-04-29 NOTE — Telephone Encounter (Signed)
Let her know that I am tentatively planning around 3:15-3:30PM on that day and I will confirm next week

## 2018-04-29 NOTE — Telephone Encounter (Signed)
Spoke to pt's daughter, [name]. She said a home visit on 5/22 would be great. She will wait to hear from Dr Alphonsus Sias.

## 2018-04-29 NOTE — Telephone Encounter (Signed)
Left message for Christina [name]

## 2018-04-30 DIAGNOSIS — I110 Hypertensive heart disease with heart failure: Secondary | ICD-10-CM | POA: Diagnosis not present

## 2018-04-30 DIAGNOSIS — I5020 Unspecified systolic (congestive) heart failure: Secondary | ICD-10-CM | POA: Diagnosis not present

## 2018-04-30 DIAGNOSIS — I252 Old myocardial infarction: Secondary | ICD-10-CM | POA: Diagnosis not present

## 2018-04-30 DIAGNOSIS — I2510 Atherosclerotic heart disease of native coronary artery without angina pectoris: Secondary | ICD-10-CM | POA: Diagnosis not present

## 2018-04-30 DIAGNOSIS — D500 Iron deficiency anemia secondary to blood loss (chronic): Secondary | ICD-10-CM | POA: Diagnosis not present

## 2018-04-30 DIAGNOSIS — I480 Paroxysmal atrial fibrillation: Secondary | ICD-10-CM | POA: Diagnosis not present

## 2018-04-30 NOTE — Telephone Encounter (Signed)
Transition Care Management Follow-up Telephone Call   Spoke with daughter, [name] (okay per DPR), as patient unable.  Date discharged?04/26/18   How have you been since you were released from the hospital? "Doing better every day, things are much better today now that Hospice is involved"   Do you understand why you were in the hospital? Yes   Do you understand the discharge instructions? Yes   Where were you discharged to? Home with daughter and Hospice.    Items Reviewed:  Medications reviewed: Yes  Allergies reviewed: Yes  Dietary changes reviewed: No changes in diet  Referrals reviewed: yes, Hospice has been set up in the home and oxygen currently in place.    Functional Questionnaire:   Activities of Daily Living (ADLs):   She states they are independent in the following: Feeding self States they require assistance with the following: bathing and hygiene, dressing, grooming, toileting. Patient is not ambulating at the present time, just transfers to wheelchair with dter assist.  Daughter lives with patient and helps with all needs.  Hospice will support bathing assistance.      Any transportation issues/concerns?: yes, patient doesn't transport very easily.  Daughter drives and Hospice care is involved in the home. For now, care will come to her in the home.      Any patient concerns? No    Confirmed importance and date/time of follow-up visits scheduled yes  Provider Appointment booked with Dr. Alphonsus Sias on 05/08/18.    Confirmed with patient if condition begins to worsen call PCP or go to the ER.  Patient was given the office number and encouraged to call back with question or concerns.  : yes

## 2018-04-30 NOTE — Telephone Encounter (Signed)
LM on patient's daughter Denyse Dago (okay per [facility]) to call back for hospital f/u call to check on patient and see if they have any needs prior to Dr. Karle Starch visit on 05/08/18.

## 2018-05-01 ENCOUNTER — Inpatient Hospital Stay: Payer: Medicare Other | Admitting: Internal Medicine

## 2018-05-01 DIAGNOSIS — I110 Hypertensive heart disease with heart failure: Secondary | ICD-10-CM | POA: Diagnosis not present

## 2018-05-01 DIAGNOSIS — I2510 Atherosclerotic heart disease of native coronary artery without angina pectoris: Secondary | ICD-10-CM | POA: Diagnosis not present

## 2018-05-01 DIAGNOSIS — I5020 Unspecified systolic (congestive) heart failure: Secondary | ICD-10-CM | POA: Diagnosis not present

## 2018-05-01 DIAGNOSIS — I252 Old myocardial infarction: Secondary | ICD-10-CM | POA: Diagnosis not present

## 2018-05-01 DIAGNOSIS — D500 Iron deficiency anemia secondary to blood loss (chronic): Secondary | ICD-10-CM | POA: Diagnosis not present

## 2018-05-01 DIAGNOSIS — I480 Paroxysmal atrial fibrillation: Secondary | ICD-10-CM | POA: Diagnosis not present

## 2018-05-03 DIAGNOSIS — I252 Old myocardial infarction: Secondary | ICD-10-CM | POA: Diagnosis not present

## 2018-05-03 DIAGNOSIS — I110 Hypertensive heart disease with heart failure: Secondary | ICD-10-CM | POA: Diagnosis not present

## 2018-05-03 DIAGNOSIS — I480 Paroxysmal atrial fibrillation: Secondary | ICD-10-CM | POA: Diagnosis not present

## 2018-05-03 DIAGNOSIS — I5020 Unspecified systolic (congestive) heart failure: Secondary | ICD-10-CM | POA: Diagnosis not present

## 2018-05-03 DIAGNOSIS — D500 Iron deficiency anemia secondary to blood loss (chronic): Secondary | ICD-10-CM | POA: Diagnosis not present

## 2018-05-03 DIAGNOSIS — I2510 Atherosclerotic heart disease of native coronary artery without angina pectoris: Secondary | ICD-10-CM | POA: Diagnosis not present

## 2018-05-07 DIAGNOSIS — D500 Iron deficiency anemia secondary to blood loss (chronic): Secondary | ICD-10-CM | POA: Diagnosis not present

## 2018-05-07 DIAGNOSIS — I2510 Atherosclerotic heart disease of native coronary artery without angina pectoris: Secondary | ICD-10-CM | POA: Diagnosis not present

## 2018-05-07 DIAGNOSIS — I480 Paroxysmal atrial fibrillation: Secondary | ICD-10-CM | POA: Diagnosis not present

## 2018-05-07 DIAGNOSIS — I110 Hypertensive heart disease with heart failure: Secondary | ICD-10-CM | POA: Diagnosis not present

## 2018-05-07 DIAGNOSIS — I5020 Unspecified systolic (congestive) heart failure: Secondary | ICD-10-CM | POA: Diagnosis not present

## 2018-05-07 DIAGNOSIS — I252 Old myocardial infarction: Secondary | ICD-10-CM | POA: Diagnosis not present

## 2018-05-08 ENCOUNTER — Encounter: Payer: Self-pay | Admitting: Internal Medicine

## 2018-05-08 ENCOUNTER — Ambulatory Visit (INDEPENDENT_AMBULATORY_CARE_PROVIDER_SITE_OTHER): Payer: Medicare Other | Admitting: Internal Medicine

## 2018-05-08 VITALS — BP 132/64 | HR 56 | Resp 18

## 2018-05-08 DIAGNOSIS — I5022 Chronic systolic (congestive) heart failure: Secondary | ICD-10-CM | POA: Diagnosis not present

## 2018-05-08 DIAGNOSIS — K922 Gastrointestinal hemorrhage, unspecified: Secondary | ICD-10-CM

## 2018-05-08 DIAGNOSIS — I2510 Atherosclerotic heart disease of native coronary artery without angina pectoris: Secondary | ICD-10-CM | POA: Diagnosis not present

## 2018-05-08 DIAGNOSIS — I252 Old myocardial infarction: Secondary | ICD-10-CM | POA: Diagnosis not present

## 2018-05-08 DIAGNOSIS — I5020 Unspecified systolic (congestive) heart failure: Secondary | ICD-10-CM | POA: Diagnosis not present

## 2018-05-08 DIAGNOSIS — I6523 Occlusion and stenosis of bilateral carotid arteries: Secondary | ICD-10-CM

## 2018-05-08 DIAGNOSIS — I480 Paroxysmal atrial fibrillation: Secondary | ICD-10-CM

## 2018-05-08 DIAGNOSIS — F39 Unspecified mood [affective] disorder: Secondary | ICD-10-CM

## 2018-05-08 DIAGNOSIS — I110 Hypertensive heart disease with heart failure: Secondary | ICD-10-CM | POA: Diagnosis not present

## 2018-05-08 DIAGNOSIS — D500 Iron deficiency anemia secondary to blood loss (chronic): Secondary | ICD-10-CM | POA: Diagnosis not present

## 2018-05-08 DIAGNOSIS — E441 Mild protein-calorie malnutrition: Secondary | ICD-10-CM

## 2018-05-08 NOTE — Assessment & Plan Note (Signed)
Exacerbation with hypotension, etc due to hypovolemia Severe disability from this--only able to walk short distances Has oxygen for night

## 2018-05-08 NOTE — Assessment & Plan Note (Signed)
Overall better with daughter here Still grieves husbands, etc Nothing to require medication

## 2018-05-08 NOTE — Assessment & Plan Note (Signed)
Regular on amiodarone Off aspirin due to the bleeding Will reconsider over time

## 2018-05-08 NOTE — Assessment & Plan Note (Signed)
Severe with hypotension and requiring transfusions Had embolization of 2 IMA branches Now better Eating better Some constipation with the iron---may just decrease to every other day

## 2018-05-08 NOTE — Assessment & Plan Note (Signed)
Bilateral blockage ~50% in 2016 No stroke symptoms No action indicated

## 2018-05-08 NOTE — Progress Notes (Signed)
Subjective:    Patient ID: Christina [name], female    DOB: 02/11/1920, 84 y.o.   MRN: [id number]  HPI Home visit due to home bound status Hospital follow up Daughter and Maralyn Sago, hospice RN, are here  Reviewed hospital records and discharge  Had seen a small amount of blood--then it got severe Brought to ER Did have bleeding scan that was positive Had embolizaton of 2 branches of the IMA Needed an additional 2 units transfusion even after--but then it stopped  Had hypotension Better after transfusions and cessation of the bleeding Sent home 12 days ago---on hospice care  Is constipated now --on iron Is taking senna bid  Gets some chest pain---but mostly if turning on her left side Sleeps on her left side Some other pain---uses the nitro with some regluarity Breathing is better now. Using oxygen at night No edema No palpitations  No stroke syndrome Memory issues are about the same  Current Outpatient Medications on File Prior to Visit  Medication Sig Dispense Refill  . acetaminophen (TYLENOL 8 HOUR) 650 MG CR tablet Take 650-1,300 mg by mouth every 8 (eight) hours as needed for pain. Do not exceed 3000 mg / 24 hrs of acetaminophen. Consider all sources    . amiodarone (PACERONE) 200 MG tablet Take 1 tablet (200 mg total) by mouth daily. 90 tablet 3  . carvedilol (COREG) 3.125 MG tablet TAKE 1 TABLET BY MOUTH TWO  TIMES DAILY 180 tablet 3  . Cholecalciferol (VITAMIN D3) 2000 units TABS Take 2,000 Units by mouth daily.     . cyanocobalamin 1000 MCG tablet Take 1,000 mcg by mouth daily.    . ferrous sulfate 325 (65 FE) MG tablet Take 1 tablet (325 mg total) by mouth daily with breakfast. 30 tablet 3  . ibandronate (BONIVA) 150 MG tablet Take 1 tablet (150 mg total) by mouth every 30 (thirty) days. (Patient taking differently: Take 150 mg by mouth every 30 (thirty) days. Takes around the 15th of the month) 3 tablet 3  . isosorbide mononitrate (IMDUR) 30 MG 24 hr tablet TAKE 1  TABLET BY MOUTH  DAILY 90 tablet 3  . Morphine Sulfate (MORPHINE CONCENTRATE) 10 mg / 0.5 ml concentrated solution Take 0.25-0.5 mLs (5-10 mg total) by mouth every 3 (three) hours as needed for severe pain or shortness of breath. 30 mL 0  . Multiple Vitamins-Minerals (MULTIVITAL) tablet Take 1 tablet by mouth daily.    . nitroGLYCERIN (NITROSTAT) 0.4 MG SL tablet PLACE 1 TABLET (0.4 MG TOTAL) UNDER THE TONGUE EVERY 5 (FIVE) MINUTES AS NEEDED FOR CHEST PAIN. 25 tablet 0  . rosuvastatin (CRESTOR) 5 MG tablet TAKE 1 TABLET BY MOUTH  DAILY 90 tablet 3  . senna (SENOKOT) 8.6 MG TABS tablet Take 1 tablet by mouth 2 (two) times daily.    . traMADol (ULTRAM) 50 MG tablet TAKE 0.5-1 TABLETS (25-50 MG TOTAL) BY MOUTH 3 (THREE) TIMES DAILY AS NEEDED. (Patient taking differently: Take 25-50 mg by mouth 3 (three) times daily as needed for moderate pain. ) 60 tablet 0   No current facility-administered medications on file prior to visit.     Allergies  Allergen Reactions  . Bee Venom Rash  . Plavix [Clopidogrel] Rash  . Atorvastatin Other (See Comments)    Reaction:  Blisters on feet   . Diltiazem Hcl Other (See Comments)    Reaction:  Unknown   . Dye Fdc Red [Red Dye] Other (See Comments)    Reaction:  Unknown   .  Gemfibrozil Other (See Comments)    Reaction:  Unknown   . Niacin Other (See Comments)    Reaction:  Unknown   . Sulfasalazine Rash    Past Medical History:  Diagnosis Date  . CAD (coronary artery disease)    a. S/P previous MI;  b. 04/2007 Cath/PCI: Taxus DES' to mid/distal RCA and RPDA as well as Ramus;  c. myoview 8/09: EF 78%, normal perfusion; d. 12/2012 Low risk MV; e. 08/2017 NSTEMI->Med Rx.  . Carotid stenosis    Bilateral, mild to moderate  . Chronic back pain   . Chronic shoulder pain   . HFrEF (heart failure with reduced ejection fraction) (HCC)    a. 08/2017 Echo: EF 20-25%, mid-apicalanteroseptal, ant, antlat, and apical sev HK. Mild MR, mildly dil LA, nl RV fxn.  .  Hyperlipidemia    Intolerant of many statins  . Hypertension   . Ischemic cardiomyopathy    a. 08/2017 Echo: EF 20-25%, mid-apicalanteroseptal, ant, antlat, and apical sev HK.  . Mild mitral and aortic regurgitation    a. 11/2011 Echo: EF 55-65%, No RWMA, Gr 1 DD, Mild AI/MR; b. 08/2017 Echo: Mild MR.  Marland Kitchen Neuropathy   . Osteoarthritis   . Osteoporosis   . PAF (paroxysmal atrial fibrillation) (HCC)    a. 08/2017 - noted @ time of NSTEMI. CHA2DS2VASc = 5-->ASA only due to age and h/o falls;  c. On Amio.  Marland Kitchen Pelvic fracture (HCC) 6/16  . Peripheral vascular disease [facility])     Past Surgical History:  Procedure Laterality Date  . 2 broken arms     . ABDOMINAL HYSTERECTOMY    . ADENOSINE MYOVIEW  01/2006   No ischemia, EF 70%  . BREAST BIOPSY     Bilateral  . CAROTID U/S  10/2006   0-39% bilaterally  . CATARACT EXTRACTION  09/2006   Right  . CORONARY ANGIOPLASTY  12/2002   Medical rx only  . CORONARY ANGIOPLASTY  01/2006   LAD/PAD disease  . CORONARY ANGIOPLASTY  03/2007   3 vessel disease  . CORONARY STENT PLACEMENT  2002   MI, [facility]  . CORONARY STENT PLACEMENT  04/2007   RCA/LCX multiple, [facility]  . CP ADMIT  09/2005   Stress myoview negative  . FEMORAL HERNIA REPAIR  05/2007   Incarcerated, right  . FRACTURE SURGERY  03/2009   Left leg  . hip fracture repair Bilateral   . INTRAMEDULLARY (IM) NAIL INTERTROCHANTERIC Right 05/18/2016   Procedure: INTRAMEDULLARY (IM) NAIL INTERTROCHANTRIC;  Surgeon: [name], MD;  Location: [facility] ORS;  Service: Orthopedics;  Laterality: Right;  . INTRAMEDULLARY (IM) NAIL INTERTROCHANTERIC Left 06/18/2017   Procedure: INTRAMEDULLARY (IM) NAIL INTERTROCHANTRIC;  Surgeon: Deeann Saint, MD;  Location: [facility] ORS;  Service: Orthopedics;  Laterality: Left;  . NM MYOVIEW LTD  11/2004   Stress, negative; NL EF  . two broken wrist on left arm,    . VESICOVAGINAL FISTULA CLOSURE W/ TAH    . VISCERAL ARTERY INTERVENTION N/A 04/22/2018   Procedure:  VISCERAL ARTERY INTERVENTION;  Surgeon: Annice Needy, MD;  Location: [facility] INVASIVE CV LAB;  Service: Cardiovascular;  Laterality: N/A;    Family History  Problem Relation Age of Onset  . Heart disease Mother   . Pulmonary embolism Father   . Heart disease Sister   . Cancer Brother   . Cancer Sister     Social History   Socioeconomic History  . Marital status: Widowed    Spouse name: Not on file  .  Number of children: 3  . Years of education: 20  . Highest education level: Not on file  Occupational History    Comment: retired from retail  Social Needs  . Financial resource strain: Not on file  . Food insecurity:    Worry: Not on file    Inability: Not on file  . Transportation needs:    Medical: Not on file    Non-medical: Not on file  Tobacco Use  . Smoking status: Never Smoker  . Smokeless tobacco: Never Used  Substance and Sexual Activity  . Alcohol use: No  . Drug use: No  . Sexual activity: Not on file  Lifestyle  . Physical activity:    Days per week: Not on file    Minutes per session: Not on file  . Stress: Not on file  Relationships  . Social connections:    Talks on phone: Not on file    Gets together: Not on file    Attends religious service: Not on file    Active member of club or organization: Not on file    Attends meetings of clubs or organizations: Not on file    Relationship status: Not on file  . Intimate partner violence:    Fear of current or ex partner: Not on file    Emotionally abused: Not on file    Physically abused: Not on file    Forced sexual activity: Not on file  Other Topics Concern  . Not on file  Social History Narrative   Widowed 2005 after long time care for husband after stroke   Remarried but widowed again 11/18      No formal living will   Daughter [name] should be her health care POA   Has DNR already   No feeding tube if cognitively unaware    Review of Systems Goes to the bathroom herself Some bad back pain at  times--has tramadol for this Some nocturia--can be every 2 hours Walking with rollator again in past few days (had needed the transport chair) Has hospice aide--- generally can dress herself though Is getting PT to help strength Eating fair    Objective:   Physical Exam  Constitutional:  Frail but no distress  Neck: No thyromegaly present.  Cardiovascular: Normal rate, regular rhythm and normal heart sounds. Exam reveals no gallop.  No murmur heard. Respiratory: Breath sounds normal. No respiratory distress. She has no wheezes. She has no rales.  GI: Soft. There is no tenderness.  Musculoskeletal: She exhibits no edema or tenderness.  Lymphadenopathy:    She has no cervical adenopathy.  Psychiatric: She has a normal mood and affect. Her behavior is normal.           Assessment & Plan:

## 2018-05-08 NOTE — Assessment & Plan Note (Signed)
Eating a little better now Daughter here to prepare food, etc

## 2018-05-09 DIAGNOSIS — I5020 Unspecified systolic (congestive) heart failure: Secondary | ICD-10-CM | POA: Diagnosis not present

## 2018-05-09 DIAGNOSIS — D500 Iron deficiency anemia secondary to blood loss (chronic): Secondary | ICD-10-CM | POA: Diagnosis not present

## 2018-05-09 DIAGNOSIS — I2510 Atherosclerotic heart disease of native coronary artery without angina pectoris: Secondary | ICD-10-CM | POA: Diagnosis not present

## 2018-05-09 DIAGNOSIS — I110 Hypertensive heart disease with heart failure: Secondary | ICD-10-CM | POA: Diagnosis not present

## 2018-05-09 DIAGNOSIS — I252 Old myocardial infarction: Secondary | ICD-10-CM | POA: Diagnosis not present

## 2018-05-09 DIAGNOSIS — I480 Paroxysmal atrial fibrillation: Secondary | ICD-10-CM | POA: Diagnosis not present

## 2018-05-10 DIAGNOSIS — I2510 Atherosclerotic heart disease of native coronary artery without angina pectoris: Secondary | ICD-10-CM | POA: Diagnosis not present

## 2018-05-10 DIAGNOSIS — D500 Iron deficiency anemia secondary to blood loss (chronic): Secondary | ICD-10-CM | POA: Diagnosis not present

## 2018-05-10 DIAGNOSIS — I252 Old myocardial infarction: Secondary | ICD-10-CM | POA: Diagnosis not present

## 2018-05-10 DIAGNOSIS — I5020 Unspecified systolic (congestive) heart failure: Secondary | ICD-10-CM | POA: Diagnosis not present

## 2018-05-10 DIAGNOSIS — I480 Paroxysmal atrial fibrillation: Secondary | ICD-10-CM | POA: Diagnosis not present

## 2018-05-10 DIAGNOSIS — I110 Hypertensive heart disease with heart failure: Secondary | ICD-10-CM | POA: Diagnosis not present

## 2018-05-14 DIAGNOSIS — I252 Old myocardial infarction: Secondary | ICD-10-CM | POA: Diagnosis not present

## 2018-05-14 DIAGNOSIS — D500 Iron deficiency anemia secondary to blood loss (chronic): Secondary | ICD-10-CM | POA: Diagnosis not present

## 2018-05-14 DIAGNOSIS — I480 Paroxysmal atrial fibrillation: Secondary | ICD-10-CM | POA: Diagnosis not present

## 2018-05-14 DIAGNOSIS — I5020 Unspecified systolic (congestive) heart failure: Secondary | ICD-10-CM | POA: Diagnosis not present

## 2018-05-14 DIAGNOSIS — I2510 Atherosclerotic heart disease of native coronary artery without angina pectoris: Secondary | ICD-10-CM | POA: Diagnosis not present

## 2018-05-14 DIAGNOSIS — I110 Hypertensive heart disease with heart failure: Secondary | ICD-10-CM | POA: Diagnosis not present

## 2018-05-15 DIAGNOSIS — I480 Paroxysmal atrial fibrillation: Secondary | ICD-10-CM | POA: Diagnosis not present

## 2018-05-15 DIAGNOSIS — I252 Old myocardial infarction: Secondary | ICD-10-CM | POA: Diagnosis not present

## 2018-05-15 DIAGNOSIS — D500 Iron deficiency anemia secondary to blood loss (chronic): Secondary | ICD-10-CM | POA: Diagnosis not present

## 2018-05-15 DIAGNOSIS — I110 Hypertensive heart disease with heart failure: Secondary | ICD-10-CM | POA: Diagnosis not present

## 2018-05-15 DIAGNOSIS — I5020 Unspecified systolic (congestive) heart failure: Secondary | ICD-10-CM | POA: Diagnosis not present

## 2018-05-15 DIAGNOSIS — I2510 Atherosclerotic heart disease of native coronary artery without angina pectoris: Secondary | ICD-10-CM | POA: Diagnosis not present

## 2018-05-16 DIAGNOSIS — D500 Iron deficiency anemia secondary to blood loss (chronic): Secondary | ICD-10-CM | POA: Diagnosis not present

## 2018-05-16 DIAGNOSIS — I2510 Atherosclerotic heart disease of native coronary artery without angina pectoris: Secondary | ICD-10-CM | POA: Diagnosis not present

## 2018-05-16 DIAGNOSIS — I252 Old myocardial infarction: Secondary | ICD-10-CM | POA: Diagnosis not present

## 2018-05-16 DIAGNOSIS — I5020 Unspecified systolic (congestive) heart failure: Secondary | ICD-10-CM | POA: Diagnosis not present

## 2018-05-16 DIAGNOSIS — I480 Paroxysmal atrial fibrillation: Secondary | ICD-10-CM | POA: Diagnosis not present

## 2018-05-16 DIAGNOSIS — I110 Hypertensive heart disease with heart failure: Secondary | ICD-10-CM | POA: Diagnosis not present

## 2018-05-17 DIAGNOSIS — I5020 Unspecified systolic (congestive) heart failure: Secondary | ICD-10-CM | POA: Diagnosis not present

## 2018-05-17 DIAGNOSIS — I2510 Atherosclerotic heart disease of native coronary artery without angina pectoris: Secondary | ICD-10-CM | POA: Diagnosis not present

## 2018-05-17 DIAGNOSIS — I252 Old myocardial infarction: Secondary | ICD-10-CM | POA: Diagnosis not present

## 2018-05-17 DIAGNOSIS — D500 Iron deficiency anemia secondary to blood loss (chronic): Secondary | ICD-10-CM | POA: Diagnosis not present

## 2018-05-17 DIAGNOSIS — I110 Hypertensive heart disease with heart failure: Secondary | ICD-10-CM | POA: Diagnosis not present

## 2018-05-17 DIAGNOSIS — I480 Paroxysmal atrial fibrillation: Secondary | ICD-10-CM | POA: Diagnosis not present

## 2018-05-18 DIAGNOSIS — I480 Paroxysmal atrial fibrillation: Secondary | ICD-10-CM | POA: Diagnosis not present

## 2018-05-18 DIAGNOSIS — I110 Hypertensive heart disease with heart failure: Secondary | ICD-10-CM | POA: Diagnosis not present

## 2018-05-18 DIAGNOSIS — I252 Old myocardial infarction: Secondary | ICD-10-CM | POA: Diagnosis not present

## 2018-05-18 DIAGNOSIS — G4733 Obstructive sleep apnea (adult) (pediatric): Secondary | ICD-10-CM | POA: Diagnosis not present

## 2018-05-18 DIAGNOSIS — Z6820 Body mass index (BMI) 20.0-20.9, adult: Secondary | ICD-10-CM | POA: Diagnosis not present

## 2018-05-18 DIAGNOSIS — D500 Iron deficiency anemia secondary to blood loss (chronic): Secondary | ICD-10-CM | POA: Diagnosis not present

## 2018-05-18 DIAGNOSIS — Z8781 Personal history of (healed) traumatic fracture: Secondary | ICD-10-CM | POA: Diagnosis not present

## 2018-05-18 DIAGNOSIS — R627 Adult failure to thrive: Secondary | ICD-10-CM | POA: Diagnosis not present

## 2018-05-18 DIAGNOSIS — I5020 Unspecified systolic (congestive) heart failure: Secondary | ICD-10-CM | POA: Diagnosis not present

## 2018-05-18 DIAGNOSIS — I2510 Atherosclerotic heart disease of native coronary artery without angina pectoris: Secondary | ICD-10-CM | POA: Diagnosis not present

## 2018-05-18 DIAGNOSIS — R634 Abnormal weight loss: Secondary | ICD-10-CM | POA: Diagnosis not present

## 2018-05-20 DIAGNOSIS — D500 Iron deficiency anemia secondary to blood loss (chronic): Secondary | ICD-10-CM | POA: Diagnosis not present

## 2018-05-20 DIAGNOSIS — I252 Old myocardial infarction: Secondary | ICD-10-CM | POA: Diagnosis not present

## 2018-05-20 DIAGNOSIS — I110 Hypertensive heart disease with heart failure: Secondary | ICD-10-CM | POA: Diagnosis not present

## 2018-05-20 DIAGNOSIS — I5020 Unspecified systolic (congestive) heart failure: Secondary | ICD-10-CM | POA: Diagnosis not present

## 2018-05-20 DIAGNOSIS — I2510 Atherosclerotic heart disease of native coronary artery without angina pectoris: Secondary | ICD-10-CM | POA: Diagnosis not present

## 2018-05-20 DIAGNOSIS — I480 Paroxysmal atrial fibrillation: Secondary | ICD-10-CM | POA: Diagnosis not present

## 2018-05-21 DIAGNOSIS — I5020 Unspecified systolic (congestive) heart failure: Secondary | ICD-10-CM | POA: Diagnosis not present

## 2018-05-21 DIAGNOSIS — I2510 Atherosclerotic heart disease of native coronary artery without angina pectoris: Secondary | ICD-10-CM | POA: Diagnosis not present

## 2018-05-21 DIAGNOSIS — D500 Iron deficiency anemia secondary to blood loss (chronic): Secondary | ICD-10-CM | POA: Diagnosis not present

## 2018-05-21 DIAGNOSIS — I480 Paroxysmal atrial fibrillation: Secondary | ICD-10-CM | POA: Diagnosis not present

## 2018-05-21 DIAGNOSIS — I252 Old myocardial infarction: Secondary | ICD-10-CM | POA: Diagnosis not present

## 2018-05-21 DIAGNOSIS — I110 Hypertensive heart disease with heart failure: Secondary | ICD-10-CM | POA: Diagnosis not present

## 2018-05-22 DIAGNOSIS — D500 Iron deficiency anemia secondary to blood loss (chronic): Secondary | ICD-10-CM | POA: Diagnosis not present

## 2018-05-22 DIAGNOSIS — I252 Old myocardial infarction: Secondary | ICD-10-CM | POA: Diagnosis not present

## 2018-05-22 DIAGNOSIS — I2510 Atherosclerotic heart disease of native coronary artery without angina pectoris: Secondary | ICD-10-CM | POA: Diagnosis not present

## 2018-05-22 DIAGNOSIS — I110 Hypertensive heart disease with heart failure: Secondary | ICD-10-CM | POA: Diagnosis not present

## 2018-05-22 DIAGNOSIS — I480 Paroxysmal atrial fibrillation: Secondary | ICD-10-CM | POA: Diagnosis not present

## 2018-05-22 DIAGNOSIS — I5020 Unspecified systolic (congestive) heart failure: Secondary | ICD-10-CM | POA: Diagnosis not present

## 2018-05-23 DIAGNOSIS — I252 Old myocardial infarction: Secondary | ICD-10-CM | POA: Diagnosis not present

## 2018-05-23 DIAGNOSIS — I2510 Atherosclerotic heart disease of native coronary artery without angina pectoris: Secondary | ICD-10-CM | POA: Diagnosis not present

## 2018-05-23 DIAGNOSIS — D500 Iron deficiency anemia secondary to blood loss (chronic): Secondary | ICD-10-CM | POA: Diagnosis not present

## 2018-05-23 DIAGNOSIS — I5020 Unspecified systolic (congestive) heart failure: Secondary | ICD-10-CM | POA: Diagnosis not present

## 2018-05-23 DIAGNOSIS — I110 Hypertensive heart disease with heart failure: Secondary | ICD-10-CM | POA: Diagnosis not present

## 2018-05-23 DIAGNOSIS — I480 Paroxysmal atrial fibrillation: Secondary | ICD-10-CM | POA: Diagnosis not present

## 2018-05-24 DIAGNOSIS — D500 Iron deficiency anemia secondary to blood loss (chronic): Secondary | ICD-10-CM | POA: Diagnosis not present

## 2018-05-24 DIAGNOSIS — I480 Paroxysmal atrial fibrillation: Secondary | ICD-10-CM | POA: Diagnosis not present

## 2018-05-24 DIAGNOSIS — I110 Hypertensive heart disease with heart failure: Secondary | ICD-10-CM | POA: Diagnosis not present

## 2018-05-24 DIAGNOSIS — I5020 Unspecified systolic (congestive) heart failure: Secondary | ICD-10-CM | POA: Diagnosis not present

## 2018-05-24 DIAGNOSIS — I2510 Atherosclerotic heart disease of native coronary artery without angina pectoris: Secondary | ICD-10-CM | POA: Diagnosis not present

## 2018-05-24 DIAGNOSIS — I252 Old myocardial infarction: Secondary | ICD-10-CM | POA: Diagnosis not present

## 2018-05-28 DIAGNOSIS — I480 Paroxysmal atrial fibrillation: Secondary | ICD-10-CM | POA: Diagnosis not present

## 2018-05-28 DIAGNOSIS — I2510 Atherosclerotic heart disease of native coronary artery without angina pectoris: Secondary | ICD-10-CM | POA: Diagnosis not present

## 2018-05-28 DIAGNOSIS — I252 Old myocardial infarction: Secondary | ICD-10-CM | POA: Diagnosis not present

## 2018-05-28 DIAGNOSIS — I5020 Unspecified systolic (congestive) heart failure: Secondary | ICD-10-CM | POA: Diagnosis not present

## 2018-05-28 DIAGNOSIS — D500 Iron deficiency anemia secondary to blood loss (chronic): Secondary | ICD-10-CM | POA: Diagnosis not present

## 2018-05-28 DIAGNOSIS — I110 Hypertensive heart disease with heart failure: Secondary | ICD-10-CM | POA: Diagnosis not present

## 2018-05-29 DIAGNOSIS — D500 Iron deficiency anemia secondary to blood loss (chronic): Secondary | ICD-10-CM | POA: Diagnosis not present

## 2018-05-29 DIAGNOSIS — I110 Hypertensive heart disease with heart failure: Secondary | ICD-10-CM | POA: Diagnosis not present

## 2018-05-29 DIAGNOSIS — I2510 Atherosclerotic heart disease of native coronary artery without angina pectoris: Secondary | ICD-10-CM | POA: Diagnosis not present

## 2018-05-29 DIAGNOSIS — I480 Paroxysmal atrial fibrillation: Secondary | ICD-10-CM | POA: Diagnosis not present

## 2018-05-29 DIAGNOSIS — I5020 Unspecified systolic (congestive) heart failure: Secondary | ICD-10-CM | POA: Diagnosis not present

## 2018-05-29 DIAGNOSIS — I252 Old myocardial infarction: Secondary | ICD-10-CM | POA: Diagnosis not present

## 2018-05-31 DIAGNOSIS — D500 Iron deficiency anemia secondary to blood loss (chronic): Secondary | ICD-10-CM | POA: Diagnosis not present

## 2018-05-31 DIAGNOSIS — I5020 Unspecified systolic (congestive) heart failure: Secondary | ICD-10-CM | POA: Diagnosis not present

## 2018-05-31 DIAGNOSIS — I110 Hypertensive heart disease with heart failure: Secondary | ICD-10-CM | POA: Diagnosis not present

## 2018-05-31 DIAGNOSIS — I2510 Atherosclerotic heart disease of native coronary artery without angina pectoris: Secondary | ICD-10-CM | POA: Diagnosis not present

## 2018-05-31 DIAGNOSIS — I480 Paroxysmal atrial fibrillation: Secondary | ICD-10-CM | POA: Diagnosis not present

## 2018-05-31 DIAGNOSIS — I252 Old myocardial infarction: Secondary | ICD-10-CM | POA: Diagnosis not present

## 2018-06-04 DIAGNOSIS — I480 Paroxysmal atrial fibrillation: Secondary | ICD-10-CM | POA: Diagnosis not present

## 2018-06-04 DIAGNOSIS — I2510 Atherosclerotic heart disease of native coronary artery without angina pectoris: Secondary | ICD-10-CM | POA: Diagnosis not present

## 2018-06-04 DIAGNOSIS — I110 Hypertensive heart disease with heart failure: Secondary | ICD-10-CM | POA: Diagnosis not present

## 2018-06-04 DIAGNOSIS — I252 Old myocardial infarction: Secondary | ICD-10-CM | POA: Diagnosis not present

## 2018-06-04 DIAGNOSIS — I5020 Unspecified systolic (congestive) heart failure: Secondary | ICD-10-CM | POA: Diagnosis not present

## 2018-06-04 DIAGNOSIS — D500 Iron deficiency anemia secondary to blood loss (chronic): Secondary | ICD-10-CM | POA: Diagnosis not present

## 2018-06-05 DIAGNOSIS — I5020 Unspecified systolic (congestive) heart failure: Secondary | ICD-10-CM | POA: Diagnosis not present

## 2018-06-05 DIAGNOSIS — I252 Old myocardial infarction: Secondary | ICD-10-CM | POA: Diagnosis not present

## 2018-06-05 DIAGNOSIS — D500 Iron deficiency anemia secondary to blood loss (chronic): Secondary | ICD-10-CM | POA: Diagnosis not present

## 2018-06-05 DIAGNOSIS — I110 Hypertensive heart disease with heart failure: Secondary | ICD-10-CM | POA: Diagnosis not present

## 2018-06-05 DIAGNOSIS — I2510 Atherosclerotic heart disease of native coronary artery without angina pectoris: Secondary | ICD-10-CM | POA: Diagnosis not present

## 2018-06-05 DIAGNOSIS — I480 Paroxysmal atrial fibrillation: Secondary | ICD-10-CM | POA: Diagnosis not present

## 2018-06-07 DIAGNOSIS — D500 Iron deficiency anemia secondary to blood loss (chronic): Secondary | ICD-10-CM | POA: Diagnosis not present

## 2018-06-07 DIAGNOSIS — I5020 Unspecified systolic (congestive) heart failure: Secondary | ICD-10-CM | POA: Diagnosis not present

## 2018-06-07 DIAGNOSIS — I480 Paroxysmal atrial fibrillation: Secondary | ICD-10-CM | POA: Diagnosis not present

## 2018-06-07 DIAGNOSIS — I252 Old myocardial infarction: Secondary | ICD-10-CM | POA: Diagnosis not present

## 2018-06-07 DIAGNOSIS — I2510 Atherosclerotic heart disease of native coronary artery without angina pectoris: Secondary | ICD-10-CM | POA: Diagnosis not present

## 2018-06-07 DIAGNOSIS — I110 Hypertensive heart disease with heart failure: Secondary | ICD-10-CM | POA: Diagnosis not present

## 2018-06-11 DIAGNOSIS — I252 Old myocardial infarction: Secondary | ICD-10-CM | POA: Diagnosis not present

## 2018-06-11 DIAGNOSIS — I2510 Atherosclerotic heart disease of native coronary artery without angina pectoris: Secondary | ICD-10-CM | POA: Diagnosis not present

## 2018-06-11 DIAGNOSIS — I110 Hypertensive heart disease with heart failure: Secondary | ICD-10-CM | POA: Diagnosis not present

## 2018-06-11 DIAGNOSIS — I5020 Unspecified systolic (congestive) heart failure: Secondary | ICD-10-CM | POA: Diagnosis not present

## 2018-06-11 DIAGNOSIS — I480 Paroxysmal atrial fibrillation: Secondary | ICD-10-CM | POA: Diagnosis not present

## 2018-06-11 DIAGNOSIS — D500 Iron deficiency anemia secondary to blood loss (chronic): Secondary | ICD-10-CM | POA: Diagnosis not present

## 2018-06-14 DIAGNOSIS — D500 Iron deficiency anemia secondary to blood loss (chronic): Secondary | ICD-10-CM | POA: Diagnosis not present

## 2018-06-14 DIAGNOSIS — I252 Old myocardial infarction: Secondary | ICD-10-CM | POA: Diagnosis not present

## 2018-06-14 DIAGNOSIS — I480 Paroxysmal atrial fibrillation: Secondary | ICD-10-CM | POA: Diagnosis not present

## 2018-06-14 DIAGNOSIS — I5020 Unspecified systolic (congestive) heart failure: Secondary | ICD-10-CM | POA: Diagnosis not present

## 2018-06-14 DIAGNOSIS — I2510 Atherosclerotic heart disease of native coronary artery without angina pectoris: Secondary | ICD-10-CM | POA: Diagnosis not present

## 2018-06-14 DIAGNOSIS — I110 Hypertensive heart disease with heart failure: Secondary | ICD-10-CM | POA: Diagnosis not present

## 2018-06-17 DIAGNOSIS — G4733 Obstructive sleep apnea (adult) (pediatric): Secondary | ICD-10-CM | POA: Diagnosis not present

## 2018-06-17 DIAGNOSIS — R634 Abnormal weight loss: Secondary | ICD-10-CM | POA: Diagnosis not present

## 2018-06-17 DIAGNOSIS — I2510 Atherosclerotic heart disease of native coronary artery without angina pectoris: Secondary | ICD-10-CM | POA: Diagnosis not present

## 2018-06-17 DIAGNOSIS — R627 Adult failure to thrive: Secondary | ICD-10-CM | POA: Diagnosis not present

## 2018-06-17 DIAGNOSIS — Z6820 Body mass index (BMI) 20.0-20.9, adult: Secondary | ICD-10-CM | POA: Diagnosis not present

## 2018-06-17 DIAGNOSIS — I110 Hypertensive heart disease with heart failure: Secondary | ICD-10-CM | POA: Diagnosis not present

## 2018-06-17 DIAGNOSIS — I5020 Unspecified systolic (congestive) heart failure: Secondary | ICD-10-CM | POA: Diagnosis not present

## 2018-06-17 DIAGNOSIS — D500 Iron deficiency anemia secondary to blood loss (chronic): Secondary | ICD-10-CM | POA: Diagnosis not present

## 2018-06-17 DIAGNOSIS — I480 Paroxysmal atrial fibrillation: Secondary | ICD-10-CM | POA: Diagnosis not present

## 2018-06-17 DIAGNOSIS — Z8781 Personal history of (healed) traumatic fracture: Secondary | ICD-10-CM | POA: Diagnosis not present

## 2018-06-17 DIAGNOSIS — I252 Old myocardial infarction: Secondary | ICD-10-CM | POA: Diagnosis not present

## 2018-06-18 DIAGNOSIS — I110 Hypertensive heart disease with heart failure: Secondary | ICD-10-CM | POA: Diagnosis not present

## 2018-06-18 DIAGNOSIS — I252 Old myocardial infarction: Secondary | ICD-10-CM | POA: Diagnosis not present

## 2018-06-18 DIAGNOSIS — I480 Paroxysmal atrial fibrillation: Secondary | ICD-10-CM | POA: Diagnosis not present

## 2018-06-18 DIAGNOSIS — D500 Iron deficiency anemia secondary to blood loss (chronic): Secondary | ICD-10-CM | POA: Diagnosis not present

## 2018-06-18 DIAGNOSIS — I2510 Atherosclerotic heart disease of native coronary artery without angina pectoris: Secondary | ICD-10-CM | POA: Diagnosis not present

## 2018-06-18 DIAGNOSIS — I5020 Unspecified systolic (congestive) heart failure: Secondary | ICD-10-CM | POA: Diagnosis not present

## 2018-06-21 DIAGNOSIS — I252 Old myocardial infarction: Secondary | ICD-10-CM | POA: Diagnosis not present

## 2018-06-21 DIAGNOSIS — D500 Iron deficiency anemia secondary to blood loss (chronic): Secondary | ICD-10-CM | POA: Diagnosis not present

## 2018-06-21 DIAGNOSIS — I2510 Atherosclerotic heart disease of native coronary artery without angina pectoris: Secondary | ICD-10-CM | POA: Diagnosis not present

## 2018-06-21 DIAGNOSIS — I5020 Unspecified systolic (congestive) heart failure: Secondary | ICD-10-CM | POA: Diagnosis not present

## 2018-06-21 DIAGNOSIS — I110 Hypertensive heart disease with heart failure: Secondary | ICD-10-CM | POA: Diagnosis not present

## 2018-06-21 DIAGNOSIS — I480 Paroxysmal atrial fibrillation: Secondary | ICD-10-CM | POA: Diagnosis not present

## 2018-06-24 DIAGNOSIS — D500 Iron deficiency anemia secondary to blood loss (chronic): Secondary | ICD-10-CM | POA: Diagnosis not present

## 2018-06-24 DIAGNOSIS — I480 Paroxysmal atrial fibrillation: Secondary | ICD-10-CM | POA: Diagnosis not present

## 2018-06-24 DIAGNOSIS — I110 Hypertensive heart disease with heart failure: Secondary | ICD-10-CM | POA: Diagnosis not present

## 2018-06-24 DIAGNOSIS — I5020 Unspecified systolic (congestive) heart failure: Secondary | ICD-10-CM | POA: Diagnosis not present

## 2018-06-24 DIAGNOSIS — I252 Old myocardial infarction: Secondary | ICD-10-CM | POA: Diagnosis not present

## 2018-06-24 DIAGNOSIS — I2510 Atherosclerotic heart disease of native coronary artery without angina pectoris: Secondary | ICD-10-CM | POA: Diagnosis not present

## 2018-06-25 DIAGNOSIS — I110 Hypertensive heart disease with heart failure: Secondary | ICD-10-CM | POA: Diagnosis not present

## 2018-06-25 DIAGNOSIS — D500 Iron deficiency anemia secondary to blood loss (chronic): Secondary | ICD-10-CM | POA: Diagnosis not present

## 2018-06-25 DIAGNOSIS — I2510 Atherosclerotic heart disease of native coronary artery without angina pectoris: Secondary | ICD-10-CM | POA: Diagnosis not present

## 2018-06-25 DIAGNOSIS — I252 Old myocardial infarction: Secondary | ICD-10-CM | POA: Diagnosis not present

## 2018-06-25 DIAGNOSIS — I5020 Unspecified systolic (congestive) heart failure: Secondary | ICD-10-CM | POA: Diagnosis not present

## 2018-06-25 DIAGNOSIS — I480 Paroxysmal atrial fibrillation: Secondary | ICD-10-CM | POA: Diagnosis not present

## 2018-06-26 DIAGNOSIS — I2510 Atherosclerotic heart disease of native coronary artery without angina pectoris: Secondary | ICD-10-CM | POA: Diagnosis not present

## 2018-06-26 DIAGNOSIS — I252 Old myocardial infarction: Secondary | ICD-10-CM | POA: Diagnosis not present

## 2018-06-26 DIAGNOSIS — I480 Paroxysmal atrial fibrillation: Secondary | ICD-10-CM | POA: Diagnosis not present

## 2018-06-26 DIAGNOSIS — I110 Hypertensive heart disease with heart failure: Secondary | ICD-10-CM | POA: Diagnosis not present

## 2018-06-26 DIAGNOSIS — I5020 Unspecified systolic (congestive) heart failure: Secondary | ICD-10-CM | POA: Diagnosis not present

## 2018-06-26 DIAGNOSIS — D500 Iron deficiency anemia secondary to blood loss (chronic): Secondary | ICD-10-CM | POA: Diagnosis not present

## 2018-06-27 DIAGNOSIS — I110 Hypertensive heart disease with heart failure: Secondary | ICD-10-CM | POA: Diagnosis not present

## 2018-06-27 DIAGNOSIS — I480 Paroxysmal atrial fibrillation: Secondary | ICD-10-CM | POA: Diagnosis not present

## 2018-06-27 DIAGNOSIS — I252 Old myocardial infarction: Secondary | ICD-10-CM | POA: Diagnosis not present

## 2018-06-27 DIAGNOSIS — I2510 Atherosclerotic heart disease of native coronary artery without angina pectoris: Secondary | ICD-10-CM | POA: Diagnosis not present

## 2018-06-27 DIAGNOSIS — D500 Iron deficiency anemia secondary to blood loss (chronic): Secondary | ICD-10-CM | POA: Diagnosis not present

## 2018-06-27 DIAGNOSIS — I5020 Unspecified systolic (congestive) heart failure: Secondary | ICD-10-CM | POA: Diagnosis not present

## 2018-06-28 DIAGNOSIS — I252 Old myocardial infarction: Secondary | ICD-10-CM | POA: Diagnosis not present

## 2018-06-28 DIAGNOSIS — D500 Iron deficiency anemia secondary to blood loss (chronic): Secondary | ICD-10-CM | POA: Diagnosis not present

## 2018-06-28 DIAGNOSIS — I5020 Unspecified systolic (congestive) heart failure: Secondary | ICD-10-CM | POA: Diagnosis not present

## 2018-06-28 DIAGNOSIS — I110 Hypertensive heart disease with heart failure: Secondary | ICD-10-CM | POA: Diagnosis not present

## 2018-06-28 DIAGNOSIS — I2510 Atherosclerotic heart disease of native coronary artery without angina pectoris: Secondary | ICD-10-CM | POA: Diagnosis not present

## 2018-06-28 DIAGNOSIS — I480 Paroxysmal atrial fibrillation: Secondary | ICD-10-CM | POA: Diagnosis not present

## 2018-07-02 DIAGNOSIS — I5020 Unspecified systolic (congestive) heart failure: Secondary | ICD-10-CM | POA: Diagnosis not present

## 2018-07-02 DIAGNOSIS — I480 Paroxysmal atrial fibrillation: Secondary | ICD-10-CM | POA: Diagnosis not present

## 2018-07-02 DIAGNOSIS — D500 Iron deficiency anemia secondary to blood loss (chronic): Secondary | ICD-10-CM | POA: Diagnosis not present

## 2018-07-02 DIAGNOSIS — I252 Old myocardial infarction: Secondary | ICD-10-CM | POA: Diagnosis not present

## 2018-07-02 DIAGNOSIS — I110 Hypertensive heart disease with heart failure: Secondary | ICD-10-CM | POA: Diagnosis not present

## 2018-07-02 DIAGNOSIS — I2510 Atherosclerotic heart disease of native coronary artery without angina pectoris: Secondary | ICD-10-CM | POA: Diagnosis not present

## 2018-07-03 ENCOUNTER — Ambulatory Visit: Payer: Medicare Other | Admitting: Internal Medicine

## 2018-07-03 ENCOUNTER — Encounter: Payer: Self-pay | Admitting: Internal Medicine

## 2018-07-03 VITALS — BP 118/62 | HR 60 | Resp 16 | Wt 95.0 lb

## 2018-07-03 DIAGNOSIS — I480 Paroxysmal atrial fibrillation: Secondary | ICD-10-CM | POA: Diagnosis not present

## 2018-07-03 DIAGNOSIS — E440 Moderate protein-calorie malnutrition: Secondary | ICD-10-CM | POA: Diagnosis not present

## 2018-07-03 DIAGNOSIS — I252 Old myocardial infarction: Secondary | ICD-10-CM | POA: Diagnosis not present

## 2018-07-03 DIAGNOSIS — I5020 Unspecified systolic (congestive) heart failure: Secondary | ICD-10-CM | POA: Diagnosis not present

## 2018-07-03 DIAGNOSIS — I5022 Chronic systolic (congestive) heart failure: Secondary | ICD-10-CM

## 2018-07-03 DIAGNOSIS — F39 Unspecified mood [affective] disorder: Secondary | ICD-10-CM | POA: Diagnosis not present

## 2018-07-03 DIAGNOSIS — I110 Hypertensive heart disease with heart failure: Secondary | ICD-10-CM | POA: Diagnosis not present

## 2018-07-03 DIAGNOSIS — D500 Iron deficiency anemia secondary to blood loss (chronic): Secondary | ICD-10-CM | POA: Diagnosis not present

## 2018-07-03 DIAGNOSIS — I2510 Atherosclerotic heart disease of native coronary artery without angina pectoris: Secondary | ICD-10-CM | POA: Diagnosis not present

## 2018-07-03 NOTE — Assessment & Plan Note (Signed)
More anxiety Has been helped by the lorazepam

## 2018-07-03 NOTE — Assessment & Plan Note (Signed)
Compensated Weight loss is niutritional

## 2018-07-03 NOTE — Assessment & Plan Note (Signed)
Sig weight loss Discussed snacking and higher calorie density foods

## 2018-07-03 NOTE — Progress Notes (Signed)
Subjective:    Patient ID: Christina [name], female    DOB: November 30, 1920, 84 y.o.   MRN: [id number]  HPI Follow up in home (very hard for her to get out)--chronic medical conditions Daughter Carney BernJean and hospice nurse Maralyn SagoSarah are here  Up and down Physical and emotional exhaustion at times Hospice aide helps with showers Dresses herself--- other than socks Independent with bathroom--some recent incontinence (mostly at night)  No more GI bleed Some abdominal pain--mostly RUQ Not related to eating or bowels---may come on if she twists her trunk  Uses oxygen at night--not in the day Can walk in home and dress without dyspnea---just gets tired Gets occasional chest pain---- uses the nitro with success No dizziness or syncope Did fall onto couch trying to get socks on this morming  Not much appetite "You know food just doesn't taste like it used to" Has lost another 6# recently Doesn't like supplements---does like the potato chips  Daughter did try the ibandronate Couldn't consistently stay up to take it  Anxiety has been worse Now taking lorazepam 05 bid Lots of reminiscing that is causing some distress (dreams about the people she has lost) Some negativity about her daughter---?strain living together Is hoping to make trip to beach  Current Outpatient Medications on File Prior to Visit  Medication Sig Dispense Refill  . acetaminophen (TYLENOL 8 HOUR) 650 MG CR tablet Take 650-1,300 mg by mouth every 8 (eight) hours as needed for pain. Do not exceed 3000 mg / 24 hrs of acetaminophen. Consider all sources    . amiodarone (PACERONE) 200 MG tablet Take 1 tablet (200 mg total) by mouth daily. 90 tablet 3  . carvedilol (COREG) 3.125 MG tablet TAKE 1 TABLET BY MOUTH TWO  TIMES DAILY 180 tablet 3  . Cholecalciferol (VITAMIN D3) 2000 units TABS Take 2,000 Units by mouth daily.     . cyanocobalamin 1000 MCG tablet Take 1,000 mcg by mouth daily.    . isosorbide mononitrate (IMDUR) 30 MG 24 hr  tablet TAKE 1 TABLET BY MOUTH  DAILY 90 tablet 3  . Morphine Sulfate (MORPHINE CONCENTRATE) 10 mg / 0.5 ml concentrated solution Take 0.25-0.5 mLs (5-10 mg total) by mouth every 3 (three) hours as needed for severe pain or shortness of breath. 30 mL 0  . Multiple Vitamins-Minerals (MULTIVITAL) tablet Take 1 tablet by mouth daily.    . nitroGLYCERIN (NITROSTAT) 0.4 MG SL tablet PLACE 1 TABLET (0.4 MG TOTAL) UNDER THE TONGUE EVERY 5 (FIVE) MINUTES AS NEEDED FOR CHEST PAIN. 25 tablet 0  . rosuvastatin (CRESTOR) 5 MG tablet TAKE 1 TABLET BY MOUTH  DAILY 90 tablet 3  . senna (SENOKOT) 8.6 MG TABS tablet Take 1 tablet by mouth 2 (two) times daily.    . traMADol (ULTRAM) 50 MG tablet TAKE 0.5-1 TABLETS (25-50 MG TOTAL) BY MOUTH 3 (THREE) TIMES DAILY AS NEEDED. (Patient taking differently: Take 25-50 mg by mouth 3 (three) times daily as needed for moderate pain. ) 60 tablet 0   No current facility-administered medications on file prior to visit.     Allergies  Allergen Reactions  . Bee Venom Rash  . Plavix [Clopidogrel] Rash  . Atorvastatin Other (See Comments)    Reaction:  Blisters on feet   . Diltiazem Hcl Other (See Comments)    Reaction:  Unknown   . Dye Fdc Red [Red Dye] Other (See Comments)    Reaction:  Unknown   . Gemfibrozil Other (See Comments)    Reaction:  Unknown   . Niacin Other (See Comments)    Reaction:  Unknown   . Sulfasalazine Rash    Past Medical History:  Diagnosis Date  . CAD (coronary artery disease)    a. S/P previous MI;  b. 04/2007 Cath/PCI: Taxus DES' to mid/distal RCA and RPDA as well as Ramus;  c. myoview 8/09: EF 78%, normal perfusion; d. 12/2012 Low risk MV; e. 08/2017 NSTEMI->Med Rx.  . Carotid stenosis    Bilateral, mild to moderate  . Chronic back pain   . Chronic shoulder pain   . HFrEF (heart failure with reduced ejection fraction) (HCC)    a. 08/2017 Echo: EF 20-25%, mid-apicalanteroseptal, ant, antlat, and apical sev HK. Mild MR, mildly dil LA, nl RV  fxn.  . Hyperlipidemia    Intolerant of many statins  . Hypertension   . Ischemic cardiomyopathy    a. 08/2017 Echo: EF 20-25%, mid-apicalanteroseptal, ant, antlat, and apical sev HK.  . Mild mitral and aortic regurgitation    a. 11/2011 Echo: EF 55-65%, No RWMA, Gr 1 DD, Mild AI/MR; b. 08/2017 Echo: Mild MR.  Marland Kitchen Neuropathy   . Osteoarthritis   . Osteoporosis   . PAF (paroxysmal atrial fibrillation) (HCC)    a. 08/2017 - noted @ time of NSTEMI. CHA2DS2VASc = 5-->ASA only due to age and h/o falls;  c. On Amio.  Marland Kitchen Pelvic fracture (HCC) 6/16  . Peripheral vascular disease [facility])     Past Surgical History:  Procedure Laterality Date  . 2 broken arms     . ABDOMINAL HYSTERECTOMY    . ADENOSINE MYOVIEW  01/2006   No ischemia, EF 70%  . BREAST BIOPSY     Bilateral  . CAROTID U/S  10/2006   0-39% bilaterally  . CATARACT EXTRACTION  09/2006   Right  . CORONARY ANGIOPLASTY  12/2002   Medical rx only  . CORONARY ANGIOPLASTY  01/2006   LAD/PAD disease  . CORONARY ANGIOPLASTY  03/2007   3 vessel disease  . CORONARY STENT PLACEMENT  2002   MI, [facility]  . CORONARY STENT PLACEMENT  04/2007   RCA/LCX multiple, [facility]  . CP ADMIT  09/2005   Stress myoview negative  . FEMORAL HERNIA REPAIR  05/2007   Incarcerated, right  . FRACTURE SURGERY  03/2009   Left leg  . hip fracture repair Bilateral   . INTRAMEDULLARY (IM) NAIL INTERTROCHANTERIC Right 05/18/2016   Procedure: INTRAMEDULLARY (IM) NAIL INTERTROCHANTRIC;  Surgeon: [name], MD;  Location: [facility] ORS;  Service: Orthopedics;  Laterality: Right;  . INTRAMEDULLARY (IM) NAIL INTERTROCHANTERIC Left 06/18/2017   Procedure: INTRAMEDULLARY (IM) NAIL INTERTROCHANTRIC;  Surgeon: Deeann Saint, MD;  Location: [facility] ORS;  Service: Orthopedics;  Laterality: Left;  . NM MYOVIEW LTD  11/2004   Stress, negative; NL EF  . two broken wrist on left arm,    . VESICOVAGINAL FISTULA CLOSURE W/ TAH    . VISCERAL ARTERY INTERVENTION N/A 04/22/2018    Procedure: VISCERAL ARTERY INTERVENTION;  Surgeon: Annice Needy, MD;  Location: [facility] INVASIVE CV LAB;  Service: Cardiovascular;  Laterality: N/A;    Family History  Problem Relation Age of Onset  . Heart disease Mother   . Pulmonary embolism Father   . Heart disease Sister   . Cancer Brother   . Cancer Sister     Social History   Socioeconomic History  . Marital status: Widowed    Spouse name: Not on file  . Number of children: 3  . Years of  education: 15  . Highest education level: Not on file  Occupational History    Comment: retired from retail  Social Needs  . Financial resource strain: Not on file  . Food insecurity:    Worry: Not on file    Inability: Not on file  . Transportation needs:    Medical: Not on file    Non-medical: Not on file  Tobacco Use  . Smoking status: Never Smoker  . Smokeless tobacco: Never Used  Substance and Sexual Activity  . Alcohol use: No  . Drug use: No  . Sexual activity: Not on file  Lifestyle  . Physical activity:    Days per week: Not on file    Minutes per session: Not on file  . Stress: Not on file  Relationships  . Social connections:    Talks on phone: Not on file    Gets together: Not on file    Attends religious service: Not on file    Active member of club or organization: Not on file    Attends meetings of clubs or organizations: Not on file    Relationship status: Not on file  . Intimate partner violence:    Fear of current or ex partner: Not on file    Emotionally abused: Not on file    Physically abused: Not on file    Forced sexual activity: Not on file  Other Topics Concern  . Not on file  Social History Narrative   Widowed 2005 after long time care for husband after stroke   Remarried but widowed again 11/18      No formal living will   Daughter [name] should be her health care POA   Has DNR already   No feeding tube if cognitively unaware   Review of Systems Still not sleeping well---restless, hears  things Dentures are not fitting well due to weight  Constipation with the iron Generally hasn't needed the tramadol--but uses it rarely    Objective:   Physical Exam  Constitutional: No distress.  Clear wasting  Neck: No thyromegaly present.  Cardiovascular: Normal rate, regular rhythm and normal heart sounds. Exam reveals no gallop.  No murmur heard. Respiratory: Effort normal and breath sounds normal. No respiratory distress. She has no wheezes. She has no rales.  GI: Soft. There is no tenderness.  Musculoskeletal: She exhibits no edema.  Lymphadenopathy:    She has no cervical adenopathy.  Psychiatric: She has a normal mood and affect. Her behavior is normal.           Assessment & Plan:

## 2018-07-03 NOTE — Assessment & Plan Note (Signed)
Regular today No anticoagulation due to bleed

## 2018-07-04 DIAGNOSIS — I110 Hypertensive heart disease with heart failure: Secondary | ICD-10-CM | POA: Diagnosis not present

## 2018-07-04 DIAGNOSIS — I2510 Atherosclerotic heart disease of native coronary artery without angina pectoris: Secondary | ICD-10-CM | POA: Diagnosis not present

## 2018-07-04 DIAGNOSIS — D500 Iron deficiency anemia secondary to blood loss (chronic): Secondary | ICD-10-CM | POA: Diagnosis not present

## 2018-07-04 DIAGNOSIS — I480 Paroxysmal atrial fibrillation: Secondary | ICD-10-CM | POA: Diagnosis not present

## 2018-07-04 DIAGNOSIS — I5020 Unspecified systolic (congestive) heart failure: Secondary | ICD-10-CM | POA: Diagnosis not present

## 2018-07-04 DIAGNOSIS — I252 Old myocardial infarction: Secondary | ICD-10-CM | POA: Diagnosis not present

## 2018-07-05 DIAGNOSIS — I480 Paroxysmal atrial fibrillation: Secondary | ICD-10-CM | POA: Diagnosis not present

## 2018-07-05 DIAGNOSIS — I252 Old myocardial infarction: Secondary | ICD-10-CM | POA: Diagnosis not present

## 2018-07-05 DIAGNOSIS — I2510 Atherosclerotic heart disease of native coronary artery without angina pectoris: Secondary | ICD-10-CM | POA: Diagnosis not present

## 2018-07-05 DIAGNOSIS — I110 Hypertensive heart disease with heart failure: Secondary | ICD-10-CM | POA: Diagnosis not present

## 2018-07-05 DIAGNOSIS — D500 Iron deficiency anemia secondary to blood loss (chronic): Secondary | ICD-10-CM | POA: Diagnosis not present

## 2018-07-05 DIAGNOSIS — I5020 Unspecified systolic (congestive) heart failure: Secondary | ICD-10-CM | POA: Diagnosis not present

## 2018-07-09 DIAGNOSIS — I480 Paroxysmal atrial fibrillation: Secondary | ICD-10-CM | POA: Diagnosis not present

## 2018-07-09 DIAGNOSIS — D500 Iron deficiency anemia secondary to blood loss (chronic): Secondary | ICD-10-CM | POA: Diagnosis not present

## 2018-07-09 DIAGNOSIS — I252 Old myocardial infarction: Secondary | ICD-10-CM | POA: Diagnosis not present

## 2018-07-09 DIAGNOSIS — I5020 Unspecified systolic (congestive) heart failure: Secondary | ICD-10-CM | POA: Diagnosis not present

## 2018-07-09 DIAGNOSIS — I110 Hypertensive heart disease with heart failure: Secondary | ICD-10-CM | POA: Diagnosis not present

## 2018-07-09 DIAGNOSIS — I2510 Atherosclerotic heart disease of native coronary artery without angina pectoris: Secondary | ICD-10-CM | POA: Diagnosis not present

## 2018-07-10 DIAGNOSIS — I2510 Atherosclerotic heart disease of native coronary artery without angina pectoris: Secondary | ICD-10-CM | POA: Diagnosis not present

## 2018-07-10 DIAGNOSIS — I480 Paroxysmal atrial fibrillation: Secondary | ICD-10-CM | POA: Diagnosis not present

## 2018-07-10 DIAGNOSIS — I5020 Unspecified systolic (congestive) heart failure: Secondary | ICD-10-CM | POA: Diagnosis not present

## 2018-07-10 DIAGNOSIS — I252 Old myocardial infarction: Secondary | ICD-10-CM | POA: Diagnosis not present

## 2018-07-10 DIAGNOSIS — D500 Iron deficiency anemia secondary to blood loss (chronic): Secondary | ICD-10-CM | POA: Diagnosis not present

## 2018-07-10 DIAGNOSIS — I110 Hypertensive heart disease with heart failure: Secondary | ICD-10-CM | POA: Diagnosis not present

## 2018-07-15 DIAGNOSIS — I480 Paroxysmal atrial fibrillation: Secondary | ICD-10-CM | POA: Diagnosis not present

## 2018-07-15 DIAGNOSIS — I2510 Atherosclerotic heart disease of native coronary artery without angina pectoris: Secondary | ICD-10-CM | POA: Diagnosis not present

## 2018-07-15 DIAGNOSIS — I110 Hypertensive heart disease with heart failure: Secondary | ICD-10-CM | POA: Diagnosis not present

## 2018-07-15 DIAGNOSIS — I252 Old myocardial infarction: Secondary | ICD-10-CM | POA: Diagnosis not present

## 2018-07-15 DIAGNOSIS — I5020 Unspecified systolic (congestive) heart failure: Secondary | ICD-10-CM | POA: Diagnosis not present

## 2018-07-15 DIAGNOSIS — D500 Iron deficiency anemia secondary to blood loss (chronic): Secondary | ICD-10-CM | POA: Diagnosis not present

## 2018-07-16 DIAGNOSIS — D500 Iron deficiency anemia secondary to blood loss (chronic): Secondary | ICD-10-CM | POA: Diagnosis not present

## 2018-07-16 DIAGNOSIS — I480 Paroxysmal atrial fibrillation: Secondary | ICD-10-CM | POA: Diagnosis not present

## 2018-07-16 DIAGNOSIS — I5020 Unspecified systolic (congestive) heart failure: Secondary | ICD-10-CM | POA: Diagnosis not present

## 2018-07-16 DIAGNOSIS — I2510 Atherosclerotic heart disease of native coronary artery without angina pectoris: Secondary | ICD-10-CM | POA: Diagnosis not present

## 2018-07-16 DIAGNOSIS — I110 Hypertensive heart disease with heart failure: Secondary | ICD-10-CM | POA: Diagnosis not present

## 2018-07-16 DIAGNOSIS — I252 Old myocardial infarction: Secondary | ICD-10-CM | POA: Diagnosis not present

## 2018-07-17 DIAGNOSIS — I252 Old myocardial infarction: Secondary | ICD-10-CM | POA: Diagnosis not present

## 2018-07-17 DIAGNOSIS — D500 Iron deficiency anemia secondary to blood loss (chronic): Secondary | ICD-10-CM | POA: Diagnosis not present

## 2018-07-17 DIAGNOSIS — I480 Paroxysmal atrial fibrillation: Secondary | ICD-10-CM | POA: Diagnosis not present

## 2018-07-17 DIAGNOSIS — I2510 Atherosclerotic heart disease of native coronary artery without angina pectoris: Secondary | ICD-10-CM | POA: Diagnosis not present

## 2018-07-17 DIAGNOSIS — I5020 Unspecified systolic (congestive) heart failure: Secondary | ICD-10-CM | POA: Diagnosis not present

## 2018-07-17 DIAGNOSIS — I110 Hypertensive heart disease with heart failure: Secondary | ICD-10-CM | POA: Diagnosis not present

## 2018-07-18 DIAGNOSIS — D500 Iron deficiency anemia secondary to blood loss (chronic): Secondary | ICD-10-CM | POA: Diagnosis not present

## 2018-07-18 DIAGNOSIS — Z681 Body mass index (BMI) 19 or less, adult: Secondary | ICD-10-CM | POA: Diagnosis not present

## 2018-07-18 DIAGNOSIS — R627 Adult failure to thrive: Secondary | ICD-10-CM | POA: Diagnosis not present

## 2018-07-18 DIAGNOSIS — I480 Paroxysmal atrial fibrillation: Secondary | ICD-10-CM | POA: Diagnosis not present

## 2018-07-18 DIAGNOSIS — R296 Repeated falls: Secondary | ICD-10-CM | POA: Diagnosis not present

## 2018-07-18 DIAGNOSIS — I110 Hypertensive heart disease with heart failure: Secondary | ICD-10-CM | POA: Diagnosis not present

## 2018-07-18 DIAGNOSIS — Z741 Need for assistance with personal care: Secondary | ICD-10-CM | POA: Diagnosis not present

## 2018-07-18 DIAGNOSIS — Z8781 Personal history of (healed) traumatic fracture: Secondary | ICD-10-CM | POA: Diagnosis not present

## 2018-07-18 DIAGNOSIS — R634 Abnormal weight loss: Secondary | ICD-10-CM | POA: Diagnosis not present

## 2018-07-18 DIAGNOSIS — I5020 Unspecified systolic (congestive) heart failure: Secondary | ICD-10-CM | POA: Diagnosis not present

## 2018-07-18 DIAGNOSIS — I2510 Atherosclerotic heart disease of native coronary artery without angina pectoris: Secondary | ICD-10-CM | POA: Diagnosis not present

## 2018-07-18 DIAGNOSIS — I252 Old myocardial infarction: Secondary | ICD-10-CM | POA: Diagnosis not present

## 2018-07-18 DIAGNOSIS — G4733 Obstructive sleep apnea (adult) (pediatric): Secondary | ICD-10-CM | POA: Diagnosis not present

## 2018-07-23 DIAGNOSIS — I252 Old myocardial infarction: Secondary | ICD-10-CM | POA: Diagnosis not present

## 2018-07-23 DIAGNOSIS — I2510 Atherosclerotic heart disease of native coronary artery without angina pectoris: Secondary | ICD-10-CM | POA: Diagnosis not present

## 2018-07-23 DIAGNOSIS — D500 Iron deficiency anemia secondary to blood loss (chronic): Secondary | ICD-10-CM | POA: Diagnosis not present

## 2018-07-23 DIAGNOSIS — I5020 Unspecified systolic (congestive) heart failure: Secondary | ICD-10-CM | POA: Diagnosis not present

## 2018-07-23 DIAGNOSIS — I480 Paroxysmal atrial fibrillation: Secondary | ICD-10-CM | POA: Diagnosis not present

## 2018-07-23 DIAGNOSIS — I110 Hypertensive heart disease with heart failure: Secondary | ICD-10-CM | POA: Diagnosis not present

## 2018-07-24 DIAGNOSIS — I480 Paroxysmal atrial fibrillation: Secondary | ICD-10-CM | POA: Diagnosis not present

## 2018-07-24 DIAGNOSIS — I5020 Unspecified systolic (congestive) heart failure: Secondary | ICD-10-CM | POA: Diagnosis not present

## 2018-07-24 DIAGNOSIS — I110 Hypertensive heart disease with heart failure: Secondary | ICD-10-CM | POA: Diagnosis not present

## 2018-07-24 DIAGNOSIS — I2510 Atherosclerotic heart disease of native coronary artery without angina pectoris: Secondary | ICD-10-CM | POA: Diagnosis not present

## 2018-07-24 DIAGNOSIS — I252 Old myocardial infarction: Secondary | ICD-10-CM | POA: Diagnosis not present

## 2018-07-24 DIAGNOSIS — D500 Iron deficiency anemia secondary to blood loss (chronic): Secondary | ICD-10-CM | POA: Diagnosis not present

## 2018-07-26 DIAGNOSIS — I480 Paroxysmal atrial fibrillation: Secondary | ICD-10-CM | POA: Diagnosis not present

## 2018-07-26 DIAGNOSIS — I252 Old myocardial infarction: Secondary | ICD-10-CM | POA: Diagnosis not present

## 2018-07-26 DIAGNOSIS — I110 Hypertensive heart disease with heart failure: Secondary | ICD-10-CM | POA: Diagnosis not present

## 2018-07-26 DIAGNOSIS — D500 Iron deficiency anemia secondary to blood loss (chronic): Secondary | ICD-10-CM | POA: Diagnosis not present

## 2018-07-26 DIAGNOSIS — I5020 Unspecified systolic (congestive) heart failure: Secondary | ICD-10-CM | POA: Diagnosis not present

## 2018-07-26 DIAGNOSIS — I2510 Atherosclerotic heart disease of native coronary artery without angina pectoris: Secondary | ICD-10-CM | POA: Diagnosis not present

## 2018-07-30 DIAGNOSIS — I480 Paroxysmal atrial fibrillation: Secondary | ICD-10-CM | POA: Diagnosis not present

## 2018-07-30 DIAGNOSIS — D500 Iron deficiency anemia secondary to blood loss (chronic): Secondary | ICD-10-CM | POA: Diagnosis not present

## 2018-07-30 DIAGNOSIS — I110 Hypertensive heart disease with heart failure: Secondary | ICD-10-CM | POA: Diagnosis not present

## 2018-07-30 DIAGNOSIS — I252 Old myocardial infarction: Secondary | ICD-10-CM | POA: Diagnosis not present

## 2018-07-30 DIAGNOSIS — I5020 Unspecified systolic (congestive) heart failure: Secondary | ICD-10-CM | POA: Diagnosis not present

## 2018-07-30 DIAGNOSIS — I2510 Atherosclerotic heart disease of native coronary artery without angina pectoris: Secondary | ICD-10-CM | POA: Diagnosis not present

## 2018-07-31 DIAGNOSIS — I2510 Atherosclerotic heart disease of native coronary artery without angina pectoris: Secondary | ICD-10-CM | POA: Diagnosis not present

## 2018-07-31 DIAGNOSIS — I110 Hypertensive heart disease with heart failure: Secondary | ICD-10-CM | POA: Diagnosis not present

## 2018-07-31 DIAGNOSIS — D500 Iron deficiency anemia secondary to blood loss (chronic): Secondary | ICD-10-CM | POA: Diagnosis not present

## 2018-07-31 DIAGNOSIS — I480 Paroxysmal atrial fibrillation: Secondary | ICD-10-CM | POA: Diagnosis not present

## 2018-07-31 DIAGNOSIS — I252 Old myocardial infarction: Secondary | ICD-10-CM | POA: Diagnosis not present

## 2018-07-31 DIAGNOSIS — I5020 Unspecified systolic (congestive) heart failure: Secondary | ICD-10-CM | POA: Diagnosis not present

## 2018-08-02 DIAGNOSIS — I2510 Atherosclerotic heart disease of native coronary artery without angina pectoris: Secondary | ICD-10-CM | POA: Diagnosis not present

## 2018-08-02 DIAGNOSIS — I110 Hypertensive heart disease with heart failure: Secondary | ICD-10-CM | POA: Diagnosis not present

## 2018-08-02 DIAGNOSIS — I5020 Unspecified systolic (congestive) heart failure: Secondary | ICD-10-CM | POA: Diagnosis not present

## 2018-08-02 DIAGNOSIS — I480 Paroxysmal atrial fibrillation: Secondary | ICD-10-CM | POA: Diagnosis not present

## 2018-08-02 DIAGNOSIS — D500 Iron deficiency anemia secondary to blood loss (chronic): Secondary | ICD-10-CM | POA: Diagnosis not present

## 2018-08-02 DIAGNOSIS — I252 Old myocardial infarction: Secondary | ICD-10-CM | POA: Diagnosis not present

## 2018-08-06 DIAGNOSIS — I110 Hypertensive heart disease with heart failure: Secondary | ICD-10-CM | POA: Diagnosis not present

## 2018-08-06 DIAGNOSIS — I252 Old myocardial infarction: Secondary | ICD-10-CM | POA: Diagnosis not present

## 2018-08-06 DIAGNOSIS — D500 Iron deficiency anemia secondary to blood loss (chronic): Secondary | ICD-10-CM | POA: Diagnosis not present

## 2018-08-06 DIAGNOSIS — I480 Paroxysmal atrial fibrillation: Secondary | ICD-10-CM | POA: Diagnosis not present

## 2018-08-06 DIAGNOSIS — I5020 Unspecified systolic (congestive) heart failure: Secondary | ICD-10-CM | POA: Diagnosis not present

## 2018-08-06 DIAGNOSIS — I2510 Atherosclerotic heart disease of native coronary artery without angina pectoris: Secondary | ICD-10-CM | POA: Diagnosis not present

## 2018-08-07 ENCOUNTER — Telehealth: Payer: Self-pay | Admitting: Internal Medicine

## 2018-08-07 DIAGNOSIS — I252 Old myocardial infarction: Secondary | ICD-10-CM | POA: Diagnosis not present

## 2018-08-07 DIAGNOSIS — I480 Paroxysmal atrial fibrillation: Secondary | ICD-10-CM | POA: Diagnosis not present

## 2018-08-07 DIAGNOSIS — I2510 Atherosclerotic heart disease of native coronary artery without angina pectoris: Secondary | ICD-10-CM | POA: Diagnosis not present

## 2018-08-07 DIAGNOSIS — D500 Iron deficiency anemia secondary to blood loss (chronic): Secondary | ICD-10-CM | POA: Diagnosis not present

## 2018-08-07 DIAGNOSIS — I5020 Unspecified systolic (congestive) heart failure: Secondary | ICD-10-CM | POA: Diagnosis not present

## 2018-08-07 DIAGNOSIS — I110 Hypertensive heart disease with heart failure: Secondary | ICD-10-CM | POA: Diagnosis not present

## 2018-08-07 NOTE — Telephone Encounter (Signed)
Notified by Sarah--hospice RN More confused and weak Thinks daughter is sister and that her parents are still living Lorazepam not helping much  I am reluctant to start more psychoactive meds, but if the delusions worsen, will start seroquel 25 mg at bedtime

## 2018-08-09 DIAGNOSIS — I110 Hypertensive heart disease with heart failure: Secondary | ICD-10-CM | POA: Diagnosis not present

## 2018-08-09 DIAGNOSIS — I5020 Unspecified systolic (congestive) heart failure: Secondary | ICD-10-CM | POA: Diagnosis not present

## 2018-08-09 DIAGNOSIS — I2510 Atherosclerotic heart disease of native coronary artery without angina pectoris: Secondary | ICD-10-CM | POA: Diagnosis not present

## 2018-08-09 DIAGNOSIS — D500 Iron deficiency anemia secondary to blood loss (chronic): Secondary | ICD-10-CM | POA: Diagnosis not present

## 2018-08-09 DIAGNOSIS — I480 Paroxysmal atrial fibrillation: Secondary | ICD-10-CM | POA: Diagnosis not present

## 2018-08-09 DIAGNOSIS — I252 Old myocardial infarction: Secondary | ICD-10-CM | POA: Diagnosis not present

## 2018-08-13 DIAGNOSIS — I252 Old myocardial infarction: Secondary | ICD-10-CM | POA: Diagnosis not present

## 2018-08-13 DIAGNOSIS — I480 Paroxysmal atrial fibrillation: Secondary | ICD-10-CM | POA: Diagnosis not present

## 2018-08-13 DIAGNOSIS — I2510 Atherosclerotic heart disease of native coronary artery without angina pectoris: Secondary | ICD-10-CM | POA: Diagnosis not present

## 2018-08-13 DIAGNOSIS — I110 Hypertensive heart disease with heart failure: Secondary | ICD-10-CM | POA: Diagnosis not present

## 2018-08-13 DIAGNOSIS — I5020 Unspecified systolic (congestive) heart failure: Secondary | ICD-10-CM | POA: Diagnosis not present

## 2018-08-13 DIAGNOSIS — D500 Iron deficiency anemia secondary to blood loss (chronic): Secondary | ICD-10-CM | POA: Diagnosis not present

## 2018-08-14 DIAGNOSIS — I110 Hypertensive heart disease with heart failure: Secondary | ICD-10-CM | POA: Diagnosis not present

## 2018-08-14 DIAGNOSIS — I2510 Atherosclerotic heart disease of native coronary artery without angina pectoris: Secondary | ICD-10-CM | POA: Diagnosis not present

## 2018-08-14 DIAGNOSIS — I5020 Unspecified systolic (congestive) heart failure: Secondary | ICD-10-CM | POA: Diagnosis not present

## 2018-08-14 DIAGNOSIS — I480 Paroxysmal atrial fibrillation: Secondary | ICD-10-CM | POA: Diagnosis not present

## 2018-08-14 DIAGNOSIS — I252 Old myocardial infarction: Secondary | ICD-10-CM | POA: Diagnosis not present

## 2018-08-14 DIAGNOSIS — D500 Iron deficiency anemia secondary to blood loss (chronic): Secondary | ICD-10-CM | POA: Diagnosis not present

## 2018-08-16 DIAGNOSIS — I2510 Atherosclerotic heart disease of native coronary artery without angina pectoris: Secondary | ICD-10-CM | POA: Diagnosis not present

## 2018-08-16 DIAGNOSIS — I5020 Unspecified systolic (congestive) heart failure: Secondary | ICD-10-CM | POA: Diagnosis not present

## 2018-08-16 DIAGNOSIS — I480 Paroxysmal atrial fibrillation: Secondary | ICD-10-CM | POA: Diagnosis not present

## 2018-08-16 DIAGNOSIS — I110 Hypertensive heart disease with heart failure: Secondary | ICD-10-CM | POA: Diagnosis not present

## 2018-08-16 DIAGNOSIS — D500 Iron deficiency anemia secondary to blood loss (chronic): Secondary | ICD-10-CM | POA: Diagnosis not present

## 2018-08-16 DIAGNOSIS — I252 Old myocardial infarction: Secondary | ICD-10-CM | POA: Diagnosis not present

## 2018-08-18 DIAGNOSIS — I110 Hypertensive heart disease with heart failure: Secondary | ICD-10-CM | POA: Diagnosis not present

## 2018-08-18 DIAGNOSIS — R634 Abnormal weight loss: Secondary | ICD-10-CM | POA: Diagnosis not present

## 2018-08-18 DIAGNOSIS — R296 Repeated falls: Secondary | ICD-10-CM | POA: Diagnosis not present

## 2018-08-18 DIAGNOSIS — Z8781 Personal history of (healed) traumatic fracture: Secondary | ICD-10-CM | POA: Diagnosis not present

## 2018-08-18 DIAGNOSIS — I5020 Unspecified systolic (congestive) heart failure: Secondary | ICD-10-CM | POA: Diagnosis not present

## 2018-08-18 DIAGNOSIS — R627 Adult failure to thrive: Secondary | ICD-10-CM | POA: Diagnosis not present

## 2018-08-18 DIAGNOSIS — I252 Old myocardial infarction: Secondary | ICD-10-CM | POA: Diagnosis not present

## 2018-08-18 DIAGNOSIS — Z741 Need for assistance with personal care: Secondary | ICD-10-CM | POA: Diagnosis not present

## 2018-08-18 DIAGNOSIS — I2510 Atherosclerotic heart disease of native coronary artery without angina pectoris: Secondary | ICD-10-CM | POA: Diagnosis not present

## 2018-08-18 DIAGNOSIS — G4733 Obstructive sleep apnea (adult) (pediatric): Secondary | ICD-10-CM | POA: Diagnosis not present

## 2018-08-18 DIAGNOSIS — I480 Paroxysmal atrial fibrillation: Secondary | ICD-10-CM | POA: Diagnosis not present

## 2018-08-18 DIAGNOSIS — Z681 Body mass index (BMI) 19 or less, adult: Secondary | ICD-10-CM | POA: Diagnosis not present

## 2018-08-18 DIAGNOSIS — D500 Iron deficiency anemia secondary to blood loss (chronic): Secondary | ICD-10-CM | POA: Diagnosis not present

## 2018-08-20 DIAGNOSIS — I252 Old myocardial infarction: Secondary | ICD-10-CM | POA: Diagnosis not present

## 2018-08-20 DIAGNOSIS — I2510 Atherosclerotic heart disease of native coronary artery without angina pectoris: Secondary | ICD-10-CM | POA: Diagnosis not present

## 2018-08-20 DIAGNOSIS — I110 Hypertensive heart disease with heart failure: Secondary | ICD-10-CM | POA: Diagnosis not present

## 2018-08-20 DIAGNOSIS — I5020 Unspecified systolic (congestive) heart failure: Secondary | ICD-10-CM | POA: Diagnosis not present

## 2018-08-20 DIAGNOSIS — I480 Paroxysmal atrial fibrillation: Secondary | ICD-10-CM | POA: Diagnosis not present

## 2018-08-20 DIAGNOSIS — D500 Iron deficiency anemia secondary to blood loss (chronic): Secondary | ICD-10-CM | POA: Diagnosis not present

## 2018-08-21 DIAGNOSIS — I2510 Atherosclerotic heart disease of native coronary artery without angina pectoris: Secondary | ICD-10-CM | POA: Diagnosis not present

## 2018-08-21 DIAGNOSIS — I480 Paroxysmal atrial fibrillation: Secondary | ICD-10-CM | POA: Diagnosis not present

## 2018-08-21 DIAGNOSIS — D500 Iron deficiency anemia secondary to blood loss (chronic): Secondary | ICD-10-CM | POA: Diagnosis not present

## 2018-08-21 DIAGNOSIS — I110 Hypertensive heart disease with heart failure: Secondary | ICD-10-CM | POA: Diagnosis not present

## 2018-08-21 DIAGNOSIS — I5020 Unspecified systolic (congestive) heart failure: Secondary | ICD-10-CM | POA: Diagnosis not present

## 2018-08-21 DIAGNOSIS — I252 Old myocardial infarction: Secondary | ICD-10-CM | POA: Diagnosis not present

## 2018-08-22 DIAGNOSIS — I5020 Unspecified systolic (congestive) heart failure: Secondary | ICD-10-CM | POA: Diagnosis not present

## 2018-08-22 DIAGNOSIS — I252 Old myocardial infarction: Secondary | ICD-10-CM | POA: Diagnosis not present

## 2018-08-22 DIAGNOSIS — I480 Paroxysmal atrial fibrillation: Secondary | ICD-10-CM | POA: Diagnosis not present

## 2018-08-22 DIAGNOSIS — D500 Iron deficiency anemia secondary to blood loss (chronic): Secondary | ICD-10-CM | POA: Diagnosis not present

## 2018-08-22 DIAGNOSIS — I110 Hypertensive heart disease with heart failure: Secondary | ICD-10-CM | POA: Diagnosis not present

## 2018-08-22 DIAGNOSIS — I2510 Atherosclerotic heart disease of native coronary artery without angina pectoris: Secondary | ICD-10-CM | POA: Diagnosis not present

## 2018-08-23 DIAGNOSIS — I252 Old myocardial infarction: Secondary | ICD-10-CM | POA: Diagnosis not present

## 2018-08-23 DIAGNOSIS — I2510 Atherosclerotic heart disease of native coronary artery without angina pectoris: Secondary | ICD-10-CM | POA: Diagnosis not present

## 2018-08-23 DIAGNOSIS — I5020 Unspecified systolic (congestive) heart failure: Secondary | ICD-10-CM | POA: Diagnosis not present

## 2018-08-23 DIAGNOSIS — I110 Hypertensive heart disease with heart failure: Secondary | ICD-10-CM | POA: Diagnosis not present

## 2018-08-23 DIAGNOSIS — D500 Iron deficiency anemia secondary to blood loss (chronic): Secondary | ICD-10-CM | POA: Diagnosis not present

## 2018-08-23 DIAGNOSIS — I480 Paroxysmal atrial fibrillation: Secondary | ICD-10-CM | POA: Diagnosis not present

## 2018-08-27 DIAGNOSIS — D500 Iron deficiency anemia secondary to blood loss (chronic): Secondary | ICD-10-CM | POA: Diagnosis not present

## 2018-08-27 DIAGNOSIS — I480 Paroxysmal atrial fibrillation: Secondary | ICD-10-CM | POA: Diagnosis not present

## 2018-08-27 DIAGNOSIS — I2510 Atherosclerotic heart disease of native coronary artery without angina pectoris: Secondary | ICD-10-CM | POA: Diagnosis not present

## 2018-08-27 DIAGNOSIS — I5020 Unspecified systolic (congestive) heart failure: Secondary | ICD-10-CM | POA: Diagnosis not present

## 2018-08-27 DIAGNOSIS — I252 Old myocardial infarction: Secondary | ICD-10-CM | POA: Diagnosis not present

## 2018-08-27 DIAGNOSIS — I110 Hypertensive heart disease with heart failure: Secondary | ICD-10-CM | POA: Diagnosis not present

## 2018-08-29 ENCOUNTER — Other Ambulatory Visit: Payer: Self-pay | Admitting: Internal Medicine

## 2018-08-29 DIAGNOSIS — I5020 Unspecified systolic (congestive) heart failure: Secondary | ICD-10-CM | POA: Diagnosis not present

## 2018-08-29 DIAGNOSIS — I252 Old myocardial infarction: Secondary | ICD-10-CM | POA: Diagnosis not present

## 2018-08-29 DIAGNOSIS — I480 Paroxysmal atrial fibrillation: Secondary | ICD-10-CM | POA: Diagnosis not present

## 2018-08-29 DIAGNOSIS — I2510 Atherosclerotic heart disease of native coronary artery without angina pectoris: Secondary | ICD-10-CM | POA: Diagnosis not present

## 2018-08-29 DIAGNOSIS — D500 Iron deficiency anemia secondary to blood loss (chronic): Secondary | ICD-10-CM | POA: Diagnosis not present

## 2018-08-29 DIAGNOSIS — I110 Hypertensive heart disease with heart failure: Secondary | ICD-10-CM | POA: Diagnosis not present

## 2018-08-29 MED ORDER — QUETIAPINE FUMARATE 50 MG PO TABS: 50.0000 mg | ORAL_TABLET | Freq: Every day | ORAL | 11 refills | Status: DC

## 2018-08-30 DIAGNOSIS — D500 Iron deficiency anemia secondary to blood loss (chronic): Secondary | ICD-10-CM | POA: Diagnosis not present

## 2018-08-30 DIAGNOSIS — I5020 Unspecified systolic (congestive) heart failure: Secondary | ICD-10-CM | POA: Diagnosis not present

## 2018-08-30 DIAGNOSIS — I110 Hypertensive heart disease with heart failure: Secondary | ICD-10-CM | POA: Diagnosis not present

## 2018-08-30 DIAGNOSIS — I480 Paroxysmal atrial fibrillation: Secondary | ICD-10-CM | POA: Diagnosis not present

## 2018-08-30 DIAGNOSIS — I252 Old myocardial infarction: Secondary | ICD-10-CM | POA: Diagnosis not present

## 2018-08-30 DIAGNOSIS — I2510 Atherosclerotic heart disease of native coronary artery without angina pectoris: Secondary | ICD-10-CM | POA: Diagnosis not present

## 2018-09-02 ENCOUNTER — Ambulatory Visit: Payer: Medicare Other | Admitting: Internal Medicine

## 2018-09-04 ENCOUNTER — Ambulatory Visit: Payer: Medicare Other | Admitting: Internal Medicine

## 2018-09-04 ENCOUNTER — Encounter: Payer: Self-pay | Admitting: Internal Medicine

## 2018-09-04 VITALS — BP 122/62 | HR 62 | Resp 18

## 2018-09-04 DIAGNOSIS — D500 Iron deficiency anemia secondary to blood loss (chronic): Secondary | ICD-10-CM | POA: Diagnosis not present

## 2018-09-04 DIAGNOSIS — I25119 Atherosclerotic heart disease of native coronary artery with unspecified angina pectoris: Secondary | ICD-10-CM | POA: Diagnosis not present

## 2018-09-04 DIAGNOSIS — R410 Disorientation, unspecified: Secondary | ICD-10-CM

## 2018-09-04 DIAGNOSIS — I110 Hypertensive heart disease with heart failure: Secondary | ICD-10-CM | POA: Diagnosis not present

## 2018-09-04 DIAGNOSIS — I5020 Unspecified systolic (congestive) heart failure: Secondary | ICD-10-CM | POA: Diagnosis not present

## 2018-09-04 DIAGNOSIS — I5022 Chronic systolic (congestive) heart failure: Secondary | ICD-10-CM | POA: Diagnosis not present

## 2018-09-04 DIAGNOSIS — I2510 Atherosclerotic heart disease of native coronary artery without angina pectoris: Secondary | ICD-10-CM | POA: Diagnosis not present

## 2018-09-04 DIAGNOSIS — I480 Paroxysmal atrial fibrillation: Secondary | ICD-10-CM | POA: Diagnosis not present

## 2018-09-04 DIAGNOSIS — I252 Old myocardial infarction: Secondary | ICD-10-CM | POA: Diagnosis not present

## 2018-09-04 DIAGNOSIS — E440 Moderate protein-calorie malnutrition: Secondary | ICD-10-CM | POA: Diagnosis not present

## 2018-09-04 MED ORDER — QUETIAPINE FUMARATE 50 MG PO TABS: 25.0000 mg | ORAL_TABLET | Freq: Two times a day (BID) | ORAL | 0 refills | Status: DC | PRN

## 2018-09-04 NOTE — Assessment & Plan Note (Signed)
Seems to have lost more weight Only eating breakfast--then spotty after that Daughter is trying to pique her appetite

## 2018-09-04 NOTE — Assessment & Plan Note (Addendum)
Marked progression  Seems to be approaching end of life Will try cutting out some medications to see if that helps Focus on symptom relief and dignity  If no better with the decrease in meds, will need hospital bed

## 2018-09-04 NOTE — Progress Notes (Signed)
Subjective:    Patient ID: Christina [name], female    DOB: 1920-06-16, 84 y.o.   MRN: [id number]  HPI Home visit for follow up of severe disabling medical conditions Daughter [name] is here Hospice social worker Marylu Lund  Nighttime hallucinations and agitation started ~6 weeks ago seroquel has helped some but did have to increase the dose recently Sleeping better  Overall status has deteriorated  Very frightened to stand---just stiffens Sleeps in bed Most of day on couch Daughter able to transfer her with maximal effort  Loves breakfast Not eating much after that Variable tastes --[name] tries to find what she wants. Ice cream, etc  Having some chest pain No apparent SOB  Some apparent ongoing joint pain Tramadol does help Only needed 1 dose of morphine lately  Current Outpatient Medications on File Prior to Visit  Medication Sig Dispense Refill  . acetaminophen (TYLENOL 8 HOUR) 650 MG CR tablet Take 650-1,300 mg by mouth every 8 (eight) hours as needed for pain. Do not exceed 3000 mg / 24 hrs of acetaminophen. Consider all sources    . amiodarone (PACERONE) 200 MG tablet Take 1 tablet (200 mg total) by mouth daily. 90 tablet 3  . carvedilol (COREG) 3.125 MG tablet TAKE 1 TABLET BY MOUTH TWO  TIMES DAILY 180 tablet 3  . Cholecalciferol (VITAMIN D3) 2000 units TABS Take 2,000 Units by mouth daily.     . cyanocobalamin 1000 MCG tablet Take 1,000 mcg by mouth daily.    . isosorbide mononitrate (IMDUR) 30 MG 24 hr tablet TAKE 1 TABLET BY MOUTH  DAILY 90 tablet 3  . LORazepam (ATIVAN) 0.5 MG tablet Take 0.5 mg by mouth 2 (two) times daily as needed for anxiety.    . Morphine Sulfate (MORPHINE CONCENTRATE) 10 mg / 0.5 ml concentrated solution Take 0.25-0.5 mLs (5-10 mg total) by mouth every 3 (three) hours as needed for severe pain or shortness of breath. 30 mL 0  . Multiple Vitamins-Minerals (MULTIVITAL) tablet Take 1 tablet by mouth daily.    . nitroGLYCERIN (NITROSTAT) 0.4 MG SL tablet  PLACE 1 TABLET (0.4 MG TOTAL) UNDER THE TONGUE EVERY 5 (FIVE) MINUTES AS NEEDED FOR CHEST PAIN. 25 tablet 0  . QUEtiapine (SEROQUEL) 50 MG tablet Take 1 tablet (50 mg total) by mouth at bedtime. 15 tablet 11  . rosuvastatin (CRESTOR) 5 MG tablet TAKE 1 TABLET BY MOUTH  DAILY 90 tablet 3  . senna (SENOKOT) 8.6 MG TABS tablet Take 1 tablet by mouth 2 (two) times daily.    . traMADol (ULTRAM) 50 MG tablet TAKE 0.5-1 TABLETS (25-50 MG TOTAL) BY MOUTH 3 (THREE) TIMES DAILY AS NEEDED. (Patient taking differently: Take 25-50 mg by mouth 3 (three) times daily as needed for moderate pain. ) 60 tablet 0   No current facility-administered medications on file prior to visit.     Allergies  Allergen Reactions  . Bee Venom Rash  . Plavix [Clopidogrel] Rash  . Atorvastatin Other (See Comments)    Reaction:  Blisters on feet   . Diltiazem Hcl Other (See Comments)    Reaction:  Unknown   . Dye Fdc Red [Red Dye] Other (See Comments)    Reaction:  Unknown   . Gemfibrozil Other (See Comments)    Reaction:  Unknown   . Niacin Other (See Comments)    Reaction:  Unknown   . Sulfasalazine Rash    Past Medical History:  Diagnosis Date  . CAD (coronary artery disease)  a. S/P previous MI;  b. 04/2007 Cath/PCI: Taxus DES' to mid/distal RCA and RPDA as well as Ramus;  c. myoview 8/09: EF 78%, normal perfusion; d. 12/2012 Low risk MV; e. 08/2017 NSTEMI->Med Rx.  . Carotid stenosis    Bilateral, mild to moderate  . Chronic back pain   . Chronic shoulder pain   . HFrEF (heart failure with reduced ejection fraction) (HCC)    a. 08/2017 Echo: EF 20-25%, mid-apicalanteroseptal, ant, antlat, and apical sev HK. Mild MR, mildly dil LA, nl RV fxn.  . Hyperlipidemia    Intolerant of many statins  . Hypertension   . Ischemic cardiomyopathy    a. 08/2017 Echo: EF 20-25%, mid-apicalanteroseptal, ant, antlat, and apical sev HK.  . Mild mitral and aortic regurgitation    a. 11/2011 Echo: EF 55-65%, No RWMA, Gr 1 DD, Mild  AI/MR; b. 08/2017 Echo: Mild MR.  Marland Kitchen. Neuropathy   . Osteoarthritis   . Osteoporosis   . PAF (paroxysmal atrial fibrillation) (HCC)    a. 08/2017 - noted @ time of NSTEMI. CHA2DS2VASc = 5-->ASA only due to age and h/o falls;  c. On Amio.  Marland Kitchen. Pelvic fracture (HCC) 6/16  . Peripheral vascular disease [facility](HCC)     Past Surgical History:  Procedure Laterality Date  . 2 broken arms     . ABDOMINAL HYSTERECTOMY    . ADENOSINE MYOVIEW  01/2006   No ischemia, EF 70%  . BREAST BIOPSY     Bilateral  . CAROTID U/S  10/2006   0-39% bilaterally  . CATARACT EXTRACTION  09/2006   Right  . CORONARY ANGIOPLASTY  12/2002   Medical rx only  . CORONARY ANGIOPLASTY  01/2006   LAD/PAD disease  . CORONARY ANGIOPLASTY  03/2007   3 vessel disease  . CORONARY STENT PLACEMENT  2002   MI, [facility]  . CORONARY STENT PLACEMENT  04/2007   RCA/LCX multiple, [facility]  . CP ADMIT  09/2005   Stress myoview negative  . FEMORAL HERNIA REPAIR  05/2007   Incarcerated, right  . FRACTURE SURGERY  03/2009   Left leg  . hip fracture repair Bilateral   . INTRAMEDULLARY (IM) NAIL INTERTROCHANTERIC Right 05/18/2016   Procedure: INTRAMEDULLARY (IM) NAIL INTERTROCHANTRIC;  Surgeon: Juanell FairlyKevin Krasinski, MD;  Location: [facility] ORS;  Service: Orthopedics;  Laterality: Right;  . INTRAMEDULLARY (IM) NAIL INTERTROCHANTERIC Left 06/18/2017   Procedure: INTRAMEDULLARY (IM) NAIL INTERTROCHANTRIC;  Surgeon: Deeann SaintMiller, Howard, MD;  Location: [facility] ORS;  Service: Orthopedics;  Laterality: Left;  . NM MYOVIEW LTD  11/2004   Stress, negative; NL EF  . two broken wrist on left arm,    . VESICOVAGINAL FISTULA CLOSURE W/ TAH    . VISCERAL ARTERY INTERVENTION N/A 04/22/2018   Procedure: VISCERAL ARTERY INTERVENTION;  Surgeon: Annice Needyew, Jason S, MD;  Location: [facility] INVASIVE CV LAB;  Service: Cardiovascular;  Laterality: N/A;    Family History  Problem Relation Age of Onset  . Heart disease Mother   . Pulmonary embolism Father   . Heart disease Sister   . Cancer  Brother   . Cancer Sister     Social History   Socioeconomic History  . Marital status: Widowed    Spouse name: Not on file  . Number of children: 3  . Years of education: 2312  . Highest education level: Not on file  Occupational History    Comment: retired from retail  Social Needs  . Financial resource strain: Not on file  . Food insecurity:  Worry: Not on file    Inability: Not on file  . Transportation needs:    Medical: Not on file    Non-medical: Not on file  Tobacco Use  . Smoking status: Never Smoker  . Smokeless tobacco: Never Used  Substance and Sexual Activity  . Alcohol use: No  . Drug use: No  . Sexual activity: Not on file  Lifestyle  . Physical activity:    Days per week: Not on file    Minutes per session: Not on file  . Stress: Not on file  Relationships  . Social connections:    Talks on phone: Not on file    Gets together: Not on file    Attends religious service: Not on file    Active member of club or organization: Not on file    Attends meetings of clubs or organizations: Not on file    Relationship status: Not on file  . Intimate partner violence:    Fear of current or ex partner: Not on file    Emotionally abused: Not on file    Physically abused: Not on file    Forced sexual activity: Not on file  Other Topics Concern  . Not on file  Social History Narrative   Widowed 2005 after long time care for husband after stroke   Remarried but widowed again 11/18      No formal living will   Daughter [name] should be her health care POA   Has DNR already   No feeding tube if cognitively unaware   Review of Systems Sleeps much of the time No BM in 3 days---they are increasing the senna for now Urine output is somewhat slower---not drinking a lot    Objective:   Physical Exam  Constitutional:  Somnolent Clear wasting Little interaction now No distress  Cardiovascular: Exam reveals no gallop.  No murmur heard. Regular in low 60's    Respiratory: Effort normal and breath sounds normal. No respiratory distress. She has no wheezes. She has no rales.  GI: Soft. There is no tenderness.  Musculoskeletal: She exhibits no edema.           Assessment & Plan:

## 2018-09-04 NOTE — Assessment & Plan Note (Signed)
Worsening at night Now potentially oversedated Will try holding the seroquel and only using prn----may need to decrease dose again

## 2018-09-04 NOTE — Assessment & Plan Note (Signed)
Given overall status, will stop carvedilol and isosorbide If angina recurs, would restart the isosorbide

## 2018-09-04 NOTE — Assessment & Plan Note (Signed)
Regular and borderline slow Will stop amiodarone and carvedilol

## 2018-09-06 DIAGNOSIS — I2510 Atherosclerotic heart disease of native coronary artery without angina pectoris: Secondary | ICD-10-CM | POA: Diagnosis not present

## 2018-09-06 DIAGNOSIS — I480 Paroxysmal atrial fibrillation: Secondary | ICD-10-CM | POA: Diagnosis not present

## 2018-09-06 DIAGNOSIS — D500 Iron deficiency anemia secondary to blood loss (chronic): Secondary | ICD-10-CM | POA: Diagnosis not present

## 2018-09-06 DIAGNOSIS — I110 Hypertensive heart disease with heart failure: Secondary | ICD-10-CM | POA: Diagnosis not present

## 2018-09-06 DIAGNOSIS — I252 Old myocardial infarction: Secondary | ICD-10-CM | POA: Diagnosis not present

## 2018-09-06 DIAGNOSIS — I5020 Unspecified systolic (congestive) heart failure: Secondary | ICD-10-CM | POA: Diagnosis not present

## 2018-09-11 DIAGNOSIS — I252 Old myocardial infarction: Secondary | ICD-10-CM | POA: Diagnosis not present

## 2018-09-11 DIAGNOSIS — I2510 Atherosclerotic heart disease of native coronary artery without angina pectoris: Secondary | ICD-10-CM | POA: Diagnosis not present

## 2018-09-11 DIAGNOSIS — I110 Hypertensive heart disease with heart failure: Secondary | ICD-10-CM | POA: Diagnosis not present

## 2018-09-11 DIAGNOSIS — D500 Iron deficiency anemia secondary to blood loss (chronic): Secondary | ICD-10-CM | POA: Diagnosis not present

## 2018-09-11 DIAGNOSIS — I5020 Unspecified systolic (congestive) heart failure: Secondary | ICD-10-CM | POA: Diagnosis not present

## 2018-09-11 DIAGNOSIS — I480 Paroxysmal atrial fibrillation: Secondary | ICD-10-CM | POA: Diagnosis not present

## 2018-09-13 DIAGNOSIS — I252 Old myocardial infarction: Secondary | ICD-10-CM | POA: Diagnosis not present

## 2018-09-13 DIAGNOSIS — I110 Hypertensive heart disease with heart failure: Secondary | ICD-10-CM | POA: Diagnosis not present

## 2018-09-13 DIAGNOSIS — I5020 Unspecified systolic (congestive) heart failure: Secondary | ICD-10-CM | POA: Diagnosis not present

## 2018-09-13 DIAGNOSIS — D500 Iron deficiency anemia secondary to blood loss (chronic): Secondary | ICD-10-CM | POA: Diagnosis not present

## 2018-09-13 DIAGNOSIS — I2510 Atherosclerotic heart disease of native coronary artery without angina pectoris: Secondary | ICD-10-CM | POA: Diagnosis not present

## 2018-09-13 DIAGNOSIS — I480 Paroxysmal atrial fibrillation: Secondary | ICD-10-CM | POA: Diagnosis not present

## 2018-09-16 DIAGNOSIS — D500 Iron deficiency anemia secondary to blood loss (chronic): Secondary | ICD-10-CM | POA: Diagnosis not present

## 2018-09-16 DIAGNOSIS — I2510 Atherosclerotic heart disease of native coronary artery without angina pectoris: Secondary | ICD-10-CM | POA: Diagnosis not present

## 2018-09-16 DIAGNOSIS — I480 Paroxysmal atrial fibrillation: Secondary | ICD-10-CM | POA: Diagnosis not present

## 2018-09-16 DIAGNOSIS — I5020 Unspecified systolic (congestive) heart failure: Secondary | ICD-10-CM | POA: Diagnosis not present

## 2018-09-16 DIAGNOSIS — I110 Hypertensive heart disease with heart failure: Secondary | ICD-10-CM | POA: Diagnosis not present

## 2018-09-16 DIAGNOSIS — I252 Old myocardial infarction: Secondary | ICD-10-CM | POA: Diagnosis not present

## 2018-09-17 DIAGNOSIS — I2510 Atherosclerotic heart disease of native coronary artery without angina pectoris: Secondary | ICD-10-CM | POA: Diagnosis not present

## 2018-09-17 DIAGNOSIS — R634 Abnormal weight loss: Secondary | ICD-10-CM | POA: Diagnosis not present

## 2018-09-17 DIAGNOSIS — I252 Old myocardial infarction: Secondary | ICD-10-CM | POA: Diagnosis not present

## 2018-09-17 DIAGNOSIS — R296 Repeated falls: Secondary | ICD-10-CM | POA: Diagnosis not present

## 2018-09-17 DIAGNOSIS — Z741 Need for assistance with personal care: Secondary | ICD-10-CM | POA: Diagnosis not present

## 2018-09-17 DIAGNOSIS — I5020 Unspecified systolic (congestive) heart failure: Secondary | ICD-10-CM | POA: Diagnosis not present

## 2018-09-17 DIAGNOSIS — Z8781 Personal history of (healed) traumatic fracture: Secondary | ICD-10-CM | POA: Diagnosis not present

## 2018-09-17 DIAGNOSIS — D500 Iron deficiency anemia secondary to blood loss (chronic): Secondary | ICD-10-CM | POA: Diagnosis not present

## 2018-09-17 DIAGNOSIS — G4733 Obstructive sleep apnea (adult) (pediatric): Secondary | ICD-10-CM | POA: Diagnosis not present

## 2018-09-17 DIAGNOSIS — I110 Hypertensive heart disease with heart failure: Secondary | ICD-10-CM | POA: Diagnosis not present

## 2018-09-17 DIAGNOSIS — Z681 Body mass index (BMI) 19 or less, adult: Secondary | ICD-10-CM | POA: Diagnosis not present

## 2018-09-17 DIAGNOSIS — R627 Adult failure to thrive: Secondary | ICD-10-CM | POA: Diagnosis not present

## 2018-09-17 DIAGNOSIS — I480 Paroxysmal atrial fibrillation: Secondary | ICD-10-CM | POA: Diagnosis not present

## 2018-09-18 DIAGNOSIS — I252 Old myocardial infarction: Secondary | ICD-10-CM | POA: Diagnosis not present

## 2018-09-18 DIAGNOSIS — D500 Iron deficiency anemia secondary to blood loss (chronic): Secondary | ICD-10-CM | POA: Diagnosis not present

## 2018-09-18 DIAGNOSIS — I2510 Atherosclerotic heart disease of native coronary artery without angina pectoris: Secondary | ICD-10-CM | POA: Diagnosis not present

## 2018-09-18 DIAGNOSIS — I5020 Unspecified systolic (congestive) heart failure: Secondary | ICD-10-CM | POA: Diagnosis not present

## 2018-09-18 DIAGNOSIS — I480 Paroxysmal atrial fibrillation: Secondary | ICD-10-CM | POA: Diagnosis not present

## 2018-09-18 DIAGNOSIS — I110 Hypertensive heart disease with heart failure: Secondary | ICD-10-CM | POA: Diagnosis not present

## 2018-09-19 DIAGNOSIS — I2510 Atherosclerotic heart disease of native coronary artery without angina pectoris: Secondary | ICD-10-CM | POA: Diagnosis not present

## 2018-09-19 DIAGNOSIS — D500 Iron deficiency anemia secondary to blood loss (chronic): Secondary | ICD-10-CM | POA: Diagnosis not present

## 2018-09-19 DIAGNOSIS — I5020 Unspecified systolic (congestive) heart failure: Secondary | ICD-10-CM | POA: Diagnosis not present

## 2018-09-19 DIAGNOSIS — I110 Hypertensive heart disease with heart failure: Secondary | ICD-10-CM | POA: Diagnosis not present

## 2018-09-19 DIAGNOSIS — I252 Old myocardial infarction: Secondary | ICD-10-CM | POA: Diagnosis not present

## 2018-09-19 DIAGNOSIS — I480 Paroxysmal atrial fibrillation: Secondary | ICD-10-CM | POA: Diagnosis not present

## 2018-09-20 DIAGNOSIS — I2510 Atherosclerotic heart disease of native coronary artery without angina pectoris: Secondary | ICD-10-CM | POA: Diagnosis not present

## 2018-09-20 DIAGNOSIS — I5020 Unspecified systolic (congestive) heart failure: Secondary | ICD-10-CM | POA: Diagnosis not present

## 2018-09-20 DIAGNOSIS — I252 Old myocardial infarction: Secondary | ICD-10-CM | POA: Diagnosis not present

## 2018-09-20 DIAGNOSIS — I480 Paroxysmal atrial fibrillation: Secondary | ICD-10-CM | POA: Diagnosis not present

## 2018-09-20 DIAGNOSIS — D500 Iron deficiency anemia secondary to blood loss (chronic): Secondary | ICD-10-CM | POA: Diagnosis not present

## 2018-09-20 DIAGNOSIS — I110 Hypertensive heart disease with heart failure: Secondary | ICD-10-CM | POA: Diagnosis not present

## 2018-09-23 DIAGNOSIS — I110 Hypertensive heart disease with heart failure: Secondary | ICD-10-CM | POA: Diagnosis not present

## 2018-09-23 DIAGNOSIS — D500 Iron deficiency anemia secondary to blood loss (chronic): Secondary | ICD-10-CM | POA: Diagnosis not present

## 2018-09-23 DIAGNOSIS — I252 Old myocardial infarction: Secondary | ICD-10-CM | POA: Diagnosis not present

## 2018-09-23 DIAGNOSIS — I5020 Unspecified systolic (congestive) heart failure: Secondary | ICD-10-CM | POA: Diagnosis not present

## 2018-09-23 DIAGNOSIS — I2510 Atherosclerotic heart disease of native coronary artery without angina pectoris: Secondary | ICD-10-CM | POA: Diagnosis not present

## 2018-09-23 DIAGNOSIS — I480 Paroxysmal atrial fibrillation: Secondary | ICD-10-CM | POA: Diagnosis not present

## 2018-09-25 DIAGNOSIS — I480 Paroxysmal atrial fibrillation: Secondary | ICD-10-CM | POA: Diagnosis not present

## 2018-09-25 DIAGNOSIS — I110 Hypertensive heart disease with heart failure: Secondary | ICD-10-CM | POA: Diagnosis not present

## 2018-09-25 DIAGNOSIS — I5020 Unspecified systolic (congestive) heart failure: Secondary | ICD-10-CM | POA: Diagnosis not present

## 2018-09-25 DIAGNOSIS — I252 Old myocardial infarction: Secondary | ICD-10-CM | POA: Diagnosis not present

## 2018-09-25 DIAGNOSIS — I2510 Atherosclerotic heart disease of native coronary artery without angina pectoris: Secondary | ICD-10-CM | POA: Diagnosis not present

## 2018-09-25 DIAGNOSIS — D500 Iron deficiency anemia secondary to blood loss (chronic): Secondary | ICD-10-CM | POA: Diagnosis not present

## 2018-09-27 DIAGNOSIS — I110 Hypertensive heart disease with heart failure: Secondary | ICD-10-CM | POA: Diagnosis not present

## 2018-09-27 DIAGNOSIS — I252 Old myocardial infarction: Secondary | ICD-10-CM | POA: Diagnosis not present

## 2018-09-27 DIAGNOSIS — I5020 Unspecified systolic (congestive) heart failure: Secondary | ICD-10-CM | POA: Diagnosis not present

## 2018-09-27 DIAGNOSIS — I2510 Atherosclerotic heart disease of native coronary artery without angina pectoris: Secondary | ICD-10-CM | POA: Diagnosis not present

## 2018-09-27 DIAGNOSIS — D500 Iron deficiency anemia secondary to blood loss (chronic): Secondary | ICD-10-CM | POA: Diagnosis not present

## 2018-09-27 DIAGNOSIS — I480 Paroxysmal atrial fibrillation: Secondary | ICD-10-CM | POA: Diagnosis not present

## 2018-09-30 DIAGNOSIS — I252 Old myocardial infarction: Secondary | ICD-10-CM | POA: Diagnosis not present

## 2018-09-30 DIAGNOSIS — I5020 Unspecified systolic (congestive) heart failure: Secondary | ICD-10-CM | POA: Diagnosis not present

## 2018-09-30 DIAGNOSIS — I480 Paroxysmal atrial fibrillation: Secondary | ICD-10-CM | POA: Diagnosis not present

## 2018-09-30 DIAGNOSIS — D500 Iron deficiency anemia secondary to blood loss (chronic): Secondary | ICD-10-CM | POA: Diagnosis not present

## 2018-09-30 DIAGNOSIS — I2510 Atherosclerotic heart disease of native coronary artery without angina pectoris: Secondary | ICD-10-CM | POA: Diagnosis not present

## 2018-09-30 DIAGNOSIS — I110 Hypertensive heart disease with heart failure: Secondary | ICD-10-CM | POA: Diagnosis not present

## 2018-10-04 DIAGNOSIS — I252 Old myocardial infarction: Secondary | ICD-10-CM | POA: Diagnosis not present

## 2018-10-04 DIAGNOSIS — D500 Iron deficiency anemia secondary to blood loss (chronic): Secondary | ICD-10-CM | POA: Diagnosis not present

## 2018-10-04 DIAGNOSIS — I480 Paroxysmal atrial fibrillation: Secondary | ICD-10-CM | POA: Diagnosis not present

## 2018-10-04 DIAGNOSIS — I110 Hypertensive heart disease with heart failure: Secondary | ICD-10-CM | POA: Diagnosis not present

## 2018-10-04 DIAGNOSIS — I5020 Unspecified systolic (congestive) heart failure: Secondary | ICD-10-CM | POA: Diagnosis not present

## 2018-10-04 DIAGNOSIS — I2510 Atherosclerotic heart disease of native coronary artery without angina pectoris: Secondary | ICD-10-CM | POA: Diagnosis not present

## 2018-10-06 DIAGNOSIS — I5020 Unspecified systolic (congestive) heart failure: Secondary | ICD-10-CM | POA: Diagnosis not present

## 2018-10-06 DIAGNOSIS — D500 Iron deficiency anemia secondary to blood loss (chronic): Secondary | ICD-10-CM | POA: Diagnosis not present

## 2018-10-06 DIAGNOSIS — I2510 Atherosclerotic heart disease of native coronary artery without angina pectoris: Secondary | ICD-10-CM | POA: Diagnosis not present

## 2018-10-06 DIAGNOSIS — I252 Old myocardial infarction: Secondary | ICD-10-CM | POA: Diagnosis not present

## 2018-10-06 DIAGNOSIS — I480 Paroxysmal atrial fibrillation: Secondary | ICD-10-CM | POA: Diagnosis not present

## 2018-10-06 DIAGNOSIS — I110 Hypertensive heart disease with heart failure: Secondary | ICD-10-CM | POA: Diagnosis not present

## 2018-10-07 DIAGNOSIS — I252 Old myocardial infarction: Secondary | ICD-10-CM | POA: Diagnosis not present

## 2018-10-07 DIAGNOSIS — I480 Paroxysmal atrial fibrillation: Secondary | ICD-10-CM | POA: Diagnosis not present

## 2018-10-07 DIAGNOSIS — I2510 Atherosclerotic heart disease of native coronary artery without angina pectoris: Secondary | ICD-10-CM | POA: Diagnosis not present

## 2018-10-07 DIAGNOSIS — I5020 Unspecified systolic (congestive) heart failure: Secondary | ICD-10-CM | POA: Diagnosis not present

## 2018-10-07 DIAGNOSIS — I110 Hypertensive heart disease with heart failure: Secondary | ICD-10-CM | POA: Diagnosis not present

## 2018-10-07 DIAGNOSIS — D500 Iron deficiency anemia secondary to blood loss (chronic): Secondary | ICD-10-CM | POA: Diagnosis not present

## 2018-10-09 DIAGNOSIS — I252 Old myocardial infarction: Secondary | ICD-10-CM | POA: Diagnosis not present

## 2018-10-09 DIAGNOSIS — I5020 Unspecified systolic (congestive) heart failure: Secondary | ICD-10-CM | POA: Diagnosis not present

## 2018-10-09 DIAGNOSIS — I2510 Atherosclerotic heart disease of native coronary artery without angina pectoris: Secondary | ICD-10-CM | POA: Diagnosis not present

## 2018-10-09 DIAGNOSIS — I110 Hypertensive heart disease with heart failure: Secondary | ICD-10-CM | POA: Diagnosis not present

## 2018-10-09 DIAGNOSIS — D500 Iron deficiency anemia secondary to blood loss (chronic): Secondary | ICD-10-CM | POA: Diagnosis not present

## 2018-10-09 DIAGNOSIS — I480 Paroxysmal atrial fibrillation: Secondary | ICD-10-CM | POA: Diagnosis not present

## 2018-10-11 DIAGNOSIS — I110 Hypertensive heart disease with heart failure: Secondary | ICD-10-CM | POA: Diagnosis not present

## 2018-10-11 DIAGNOSIS — I2510 Atherosclerotic heart disease of native coronary artery without angina pectoris: Secondary | ICD-10-CM | POA: Diagnosis not present

## 2018-10-11 DIAGNOSIS — D500 Iron deficiency anemia secondary to blood loss (chronic): Secondary | ICD-10-CM | POA: Diagnosis not present

## 2018-10-11 DIAGNOSIS — I252 Old myocardial infarction: Secondary | ICD-10-CM | POA: Diagnosis not present

## 2018-10-11 DIAGNOSIS — I5020 Unspecified systolic (congestive) heart failure: Secondary | ICD-10-CM | POA: Diagnosis not present

## 2018-10-11 DIAGNOSIS — I480 Paroxysmal atrial fibrillation: Secondary | ICD-10-CM | POA: Diagnosis not present

## 2018-10-14 DIAGNOSIS — I110 Hypertensive heart disease with heart failure: Secondary | ICD-10-CM | POA: Diagnosis not present

## 2018-10-14 DIAGNOSIS — I5020 Unspecified systolic (congestive) heart failure: Secondary | ICD-10-CM | POA: Diagnosis not present

## 2018-10-14 DIAGNOSIS — I2510 Atherosclerotic heart disease of native coronary artery without angina pectoris: Secondary | ICD-10-CM | POA: Diagnosis not present

## 2018-10-14 DIAGNOSIS — I252 Old myocardial infarction: Secondary | ICD-10-CM | POA: Diagnosis not present

## 2018-10-14 DIAGNOSIS — D500 Iron deficiency anemia secondary to blood loss (chronic): Secondary | ICD-10-CM | POA: Diagnosis not present

## 2018-10-14 DIAGNOSIS — I480 Paroxysmal atrial fibrillation: Secondary | ICD-10-CM | POA: Diagnosis not present

## 2018-10-16 DIAGNOSIS — D500 Iron deficiency anemia secondary to blood loss (chronic): Secondary | ICD-10-CM | POA: Diagnosis not present

## 2018-10-16 DIAGNOSIS — I5020 Unspecified systolic (congestive) heart failure: Secondary | ICD-10-CM | POA: Diagnosis not present

## 2018-10-16 DIAGNOSIS — I110 Hypertensive heart disease with heart failure: Secondary | ICD-10-CM | POA: Diagnosis not present

## 2018-10-16 DIAGNOSIS — I252 Old myocardial infarction: Secondary | ICD-10-CM | POA: Diagnosis not present

## 2018-10-16 DIAGNOSIS — I480 Paroxysmal atrial fibrillation: Secondary | ICD-10-CM | POA: Diagnosis not present

## 2018-10-16 DIAGNOSIS — I2510 Atherosclerotic heart disease of native coronary artery without angina pectoris: Secondary | ICD-10-CM | POA: Diagnosis not present

## 2018-10-18 DIAGNOSIS — I5020 Unspecified systolic (congestive) heart failure: Secondary | ICD-10-CM | POA: Diagnosis not present

## 2018-10-18 DIAGNOSIS — I252 Old myocardial infarction: Secondary | ICD-10-CM | POA: Diagnosis not present

## 2018-10-18 DIAGNOSIS — R627 Adult failure to thrive: Secondary | ICD-10-CM | POA: Diagnosis not present

## 2018-10-18 DIAGNOSIS — D500 Iron deficiency anemia secondary to blood loss (chronic): Secondary | ICD-10-CM | POA: Diagnosis not present

## 2018-10-18 DIAGNOSIS — R634 Abnormal weight loss: Secondary | ICD-10-CM | POA: Diagnosis not present

## 2018-10-18 DIAGNOSIS — I480 Paroxysmal atrial fibrillation: Secondary | ICD-10-CM | POA: Diagnosis not present

## 2018-10-18 DIAGNOSIS — Z741 Need for assistance with personal care: Secondary | ICD-10-CM | POA: Diagnosis not present

## 2018-10-18 DIAGNOSIS — Z8781 Personal history of (healed) traumatic fracture: Secondary | ICD-10-CM | POA: Diagnosis not present

## 2018-10-18 DIAGNOSIS — I2510 Atherosclerotic heart disease of native coronary artery without angina pectoris: Secondary | ICD-10-CM | POA: Diagnosis not present

## 2018-10-18 DIAGNOSIS — R296 Repeated falls: Secondary | ICD-10-CM | POA: Diagnosis not present

## 2018-10-18 DIAGNOSIS — G4733 Obstructive sleep apnea (adult) (pediatric): Secondary | ICD-10-CM | POA: Diagnosis not present

## 2018-10-18 DIAGNOSIS — I110 Hypertensive heart disease with heart failure: Secondary | ICD-10-CM | POA: Diagnosis not present

## 2018-10-18 DIAGNOSIS — Z681 Body mass index (BMI) 19 or less, adult: Secondary | ICD-10-CM | POA: Diagnosis not present

## 2018-10-21 DIAGNOSIS — I2510 Atherosclerotic heart disease of native coronary artery without angina pectoris: Secondary | ICD-10-CM | POA: Diagnosis not present

## 2018-10-21 DIAGNOSIS — D500 Iron deficiency anemia secondary to blood loss (chronic): Secondary | ICD-10-CM | POA: Diagnosis not present

## 2018-10-21 DIAGNOSIS — I5020 Unspecified systolic (congestive) heart failure: Secondary | ICD-10-CM | POA: Diagnosis not present

## 2018-10-21 DIAGNOSIS — I252 Old myocardial infarction: Secondary | ICD-10-CM | POA: Diagnosis not present

## 2018-10-21 DIAGNOSIS — I480 Paroxysmal atrial fibrillation: Secondary | ICD-10-CM | POA: Diagnosis not present

## 2018-10-21 DIAGNOSIS — I110 Hypertensive heart disease with heart failure: Secondary | ICD-10-CM | POA: Diagnosis not present

## 2018-10-23 DIAGNOSIS — I5020 Unspecified systolic (congestive) heart failure: Secondary | ICD-10-CM | POA: Diagnosis not present

## 2018-10-23 DIAGNOSIS — I2510 Atherosclerotic heart disease of native coronary artery without angina pectoris: Secondary | ICD-10-CM | POA: Diagnosis not present

## 2018-10-23 DIAGNOSIS — I480 Paroxysmal atrial fibrillation: Secondary | ICD-10-CM | POA: Diagnosis not present

## 2018-10-23 DIAGNOSIS — I110 Hypertensive heart disease with heart failure: Secondary | ICD-10-CM | POA: Diagnosis not present

## 2018-10-23 DIAGNOSIS — I252 Old myocardial infarction: Secondary | ICD-10-CM | POA: Diagnosis not present

## 2018-10-23 DIAGNOSIS — D500 Iron deficiency anemia secondary to blood loss (chronic): Secondary | ICD-10-CM | POA: Diagnosis not present

## 2018-10-25 DIAGNOSIS — I252 Old myocardial infarction: Secondary | ICD-10-CM | POA: Diagnosis not present

## 2018-10-25 DIAGNOSIS — I480 Paroxysmal atrial fibrillation: Secondary | ICD-10-CM | POA: Diagnosis not present

## 2018-10-25 DIAGNOSIS — I5020 Unspecified systolic (congestive) heart failure: Secondary | ICD-10-CM | POA: Diagnosis not present

## 2018-10-25 DIAGNOSIS — D500 Iron deficiency anemia secondary to blood loss (chronic): Secondary | ICD-10-CM | POA: Diagnosis not present

## 2018-10-25 DIAGNOSIS — I2510 Atherosclerotic heart disease of native coronary artery without angina pectoris: Secondary | ICD-10-CM | POA: Diagnosis not present

## 2018-10-25 DIAGNOSIS — I110 Hypertensive heart disease with heart failure: Secondary | ICD-10-CM | POA: Diagnosis not present

## 2018-10-28 DIAGNOSIS — I5020 Unspecified systolic (congestive) heart failure: Secondary | ICD-10-CM | POA: Diagnosis not present

## 2018-10-28 DIAGNOSIS — I252 Old myocardial infarction: Secondary | ICD-10-CM | POA: Diagnosis not present

## 2018-10-28 DIAGNOSIS — I480 Paroxysmal atrial fibrillation: Secondary | ICD-10-CM | POA: Diagnosis not present

## 2018-10-28 DIAGNOSIS — I2510 Atherosclerotic heart disease of native coronary artery without angina pectoris: Secondary | ICD-10-CM | POA: Diagnosis not present

## 2018-10-28 DIAGNOSIS — I110 Hypertensive heart disease with heart failure: Secondary | ICD-10-CM | POA: Diagnosis not present

## 2018-10-28 DIAGNOSIS — D500 Iron deficiency anemia secondary to blood loss (chronic): Secondary | ICD-10-CM | POA: Diagnosis not present

## 2018-10-30 ENCOUNTER — Encounter: Payer: Self-pay | Admitting: Internal Medicine

## 2018-10-30 ENCOUNTER — Ambulatory Visit: Payer: Medicare Other | Admitting: Internal Medicine

## 2018-10-30 VITALS — BP 136/84 | HR 68 | Resp 20

## 2018-10-30 DIAGNOSIS — I480 Paroxysmal atrial fibrillation: Secondary | ICD-10-CM

## 2018-10-30 DIAGNOSIS — M159 Polyosteoarthritis, unspecified: Secondary | ICD-10-CM

## 2018-10-30 DIAGNOSIS — I5022 Chronic systolic (congestive) heart failure: Secondary | ICD-10-CM | POA: Diagnosis not present

## 2018-10-30 DIAGNOSIS — F22 Delusional disorders: Secondary | ICD-10-CM

## 2018-10-30 DIAGNOSIS — I110 Hypertensive heart disease with heart failure: Secondary | ICD-10-CM | POA: Diagnosis not present

## 2018-10-30 DIAGNOSIS — I252 Old myocardial infarction: Secondary | ICD-10-CM | POA: Diagnosis not present

## 2018-10-30 DIAGNOSIS — D500 Iron deficiency anemia secondary to blood loss (chronic): Secondary | ICD-10-CM | POA: Diagnosis not present

## 2018-10-30 DIAGNOSIS — I25119 Atherosclerotic heart disease of native coronary artery with unspecified angina pectoris: Secondary | ICD-10-CM

## 2018-10-30 DIAGNOSIS — M150 Primary generalized (osteo)arthritis: Secondary | ICD-10-CM | POA: Diagnosis not present

## 2018-10-30 DIAGNOSIS — I6523 Occlusion and stenosis of bilateral carotid arteries: Secondary | ICD-10-CM

## 2018-10-30 DIAGNOSIS — I2510 Atherosclerotic heart disease of native coronary artery without angina pectoris: Secondary | ICD-10-CM | POA: Diagnosis not present

## 2018-10-30 DIAGNOSIS — I5020 Unspecified systolic (congestive) heart failure: Secondary | ICD-10-CM | POA: Diagnosis not present

## 2018-10-30 DIAGNOSIS — F29 Unspecified psychosis not due to a substance or known physiological condition: Secondary | ICD-10-CM | POA: Insufficient documentation

## 2018-10-30 MED ORDER — RISPERIDONE 0.25 MG PO TABS: 0.2500 mg | ORAL_TABLET | Freq: Every evening | ORAL | 1 refills | Status: DC | PRN

## 2018-10-30 NOTE — Assessment & Plan Note (Signed)
Still some chest pain but not as persistent Has the nitro for prn

## 2018-10-30 NOTE — Assessment & Plan Note (Signed)
Agitated particularly in the evening Affecting her care since she acts out against her care giving daughter Quetiapine helps but knocks her out Daughter hasn't been giving her the lorazepam Will try low dose risperidone

## 2018-10-30 NOTE — Assessment & Plan Note (Signed)
Regular now Rate is good

## 2018-10-30 NOTE — Assessment & Plan Note (Signed)
No action given no recent CVA and hospice status

## 2018-10-30 NOTE — Assessment & Plan Note (Signed)
Mostly back Doing okay if lying down so little medication for this lately

## 2018-10-30 NOTE — Assessment & Plan Note (Signed)
Actually seems somewhat better off the meds Continues on hospice care--- close to total care (other than eating)

## 2018-10-30 NOTE — Progress Notes (Signed)
Subjective:    Patient ID: Christina [name], female    DOB: 02-Jan-1920, 84 y.o.   MRN: [id number]  HPI Home visit for follow up of CHF and other medical problems Daughter is here Ship broker also here  Did become more alert after last visit, when medications stopped This happened within a few days Has had some nighttime agitation at times There has been paranoia---not eating due to fear that daughter is going to poison her, etc The quetiapine helps but may put her to sleep for 18 hours (even just 25mg )  Staying on her couch Uses wheelchair for transport to bathroom Aides come three times a week for a shower Has rarely taken a few steps Does eat at the table  Ongoing back pain Reclining on the couch does help--so hasn't used the tramadol or morphine  Some chest pain--hasn't taken the nitro Breathing is okay at rest Sleeps flat on the bed-- 2 pillows No sig edema Uses oxygen at night--but not much during the day  Confusion more evident No clear focal neurologic event --to suggest carotid occlusion or sig stroke  Current Outpatient Medications on File Prior to Visit  Medication Sig Dispense Refill  . acetaminophen (TYLENOL 8 HOUR) 650 MG CR tablet Take 650-1,300 mg by mouth every 8 (eight) hours as needed for pain. Do not exceed 3000 mg / 24 hrs of acetaminophen. Consider all sources    . LORazepam (ATIVAN) 0.5 MG tablet Take 0.5 mg by mouth 2 (two) times daily as needed for anxiety.    . Morphine Sulfate (MORPHINE CONCENTRATE) 10 mg / 0.5 ml concentrated solution Take 0.25-0.5 mLs (5-10 mg total) by mouth every 3 (three) hours as needed for severe pain or shortness of breath. 30 mL 0  . nitroGLYCERIN (NITROSTAT) 0.4 MG SL tablet PLACE 1 TABLET (0.4 MG TOTAL) UNDER THE TONGUE EVERY 5 (FIVE) MINUTES AS NEEDED FOR CHEST PAIN. 25 tablet 0  . QUEtiapine (SEROQUEL) 50 MG tablet Take 0.5-1 tablets (25-50 mg total) by mouth 3 times/day as needed-between meals & bedtime. 1 tablet 0    . senna (SENOKOT) 8.6 MG TABS tablet Take 1 tablet by mouth 2 (two) times daily.    . traMADol (ULTRAM) 50 MG tablet TAKE 0.5-1 TABLETS (25-50 MG TOTAL) BY MOUTH 3 (THREE) TIMES DAILY AS NEEDED. (Patient taking differently: Take 25-50 mg by mouth 3 (three) times daily as needed for moderate pain. ) 60 tablet 0   No current facility-administered medications on file prior to visit.     Allergies  Allergen Reactions  . Bee Venom Rash  . Plavix [Clopidogrel] Rash  . Atorvastatin Other (See Comments)    Reaction:  Blisters on feet   . Diltiazem Hcl Other (See Comments)    Reaction:  Unknown   . Dye Fdc Red [Red Dye] Other (See Comments)    Reaction:  Unknown   . Gemfibrozil Other (See Comments)    Reaction:  Unknown   . Niacin Other (See Comments)    Reaction:  Unknown   . Sulfasalazine Rash    Past Medical History:  Diagnosis Date  . CAD (coronary artery disease)    a. S/P previous MI;  b. 04/2007 Cath/PCI: Taxus DES' to mid/distal RCA and RPDA as well as Ramus;  c. myoview 8/09: EF 78%, normal perfusion; d. 12/2012 Low risk MV; e. 08/2017 NSTEMI->Med Rx.  . Carotid stenosis    Bilateral, mild to moderate  . Chronic back pain   . Chronic shoulder pain   .  HFrEF (heart failure with reduced ejection fraction) (HCC)    a. 08/2017 Echo: EF 20-25%, mid-apicalanteroseptal, ant, antlat, and apical sev HK. Mild MR, mildly dil LA, nl RV fxn.  . Hyperlipidemia    Intolerant of many statins  . Hypertension   . Ischemic cardiomyopathy    a. 08/2017 Echo: EF 20-25%, mid-apicalanteroseptal, ant, antlat, and apical sev HK.  . Mild mitral and aortic regurgitation    a. 11/2011 Echo: EF 55-65%, No RWMA, Gr 1 DD, Mild AI/MR; b. 08/2017 Echo: Mild MR.  Marland Kitchen Neuropathy   . Osteoarthritis   . Osteoporosis   . PAF (paroxysmal atrial fibrillation) (HCC)    a. 08/2017 - noted @ time of NSTEMI. CHA2DS2VASc = 5-->ASA only due to age and h/o falls;  c. On Amio.  Marland Kitchen Pelvic fracture (HCC) 6/16  . Peripheral  vascular disease [facility])     Past Surgical History:  Procedure Laterality Date  . 2 broken arms     . ABDOMINAL HYSTERECTOMY    . ADENOSINE MYOVIEW  01/2006   No ischemia, EF 70%  . BREAST BIOPSY     Bilateral  . CAROTID U/S  10/2006   0-39% bilaterally  . CATARACT EXTRACTION  09/2006   Right  . CORONARY ANGIOPLASTY  12/2002   Medical rx only  . CORONARY ANGIOPLASTY  01/2006   LAD/PAD disease  . CORONARY ANGIOPLASTY  03/2007   3 vessel disease  . CORONARY STENT PLACEMENT  2002   MI, [facility]  . CORONARY STENT PLACEMENT  04/2007   RCA/LCX multiple, [facility]  . CP ADMIT  09/2005   Stress myoview negative  . FEMORAL HERNIA REPAIR  05/2007   Incarcerated, right  . FRACTURE SURGERY  03/2009   Left leg  . hip fracture repair Bilateral   . INTRAMEDULLARY (IM) NAIL INTERTROCHANTERIC Right 05/18/2016   Procedure: INTRAMEDULLARY (IM) NAIL INTERTROCHANTRIC;  Surgeon: [name], MD;  Location: [facility] ORS;  Service: Orthopedics;  Laterality: Right;  . INTRAMEDULLARY (IM) NAIL INTERTROCHANTERIC Left 06/18/2017   Procedure: INTRAMEDULLARY (IM) NAIL INTERTROCHANTRIC;  Surgeon: Deeann Saint, MD;  Location: [facility] ORS;  Service: Orthopedics;  Laterality: Left;  . NM MYOVIEW LTD  11/2004   Stress, negative; NL EF  . two broken wrist on left arm,    . VESICOVAGINAL FISTULA CLOSURE W/ TAH    . VISCERAL ARTERY INTERVENTION N/A 04/22/2018   Procedure: VISCERAL ARTERY INTERVENTION;  Surgeon: Annice Needy, MD;  Location: [facility] INVASIVE CV LAB;  Service: Cardiovascular;  Laterality: N/A;    Family History  Problem Relation Age of Onset  . Heart disease Mother   . Pulmonary embolism Father   . Heart disease Sister   . Cancer Brother   . Cancer Sister     Social History   Socioeconomic History  . Marital status: Widowed    Spouse name: Not on file  . Number of children: 3  . Years of education: 50  . Highest education level: Not on file  Occupational History    Comment: retired from retail    Social Needs  . Financial resource strain: Not on file  . Food insecurity:    Worry: Not on file    Inability: Not on file  . Transportation needs:    Medical: Not on file    Non-medical: Not on file  Tobacco Use  . Smoking status: Never Smoker  . Smokeless tobacco: Never Used  Substance and Sexual Activity  . Alcohol use: No  . Drug use: No  .  Sexual activity: Not on file  Lifestyle  . Physical activity:    Days per week: Not on file    Minutes per session: Not on file  . Stress: Not on file  Relationships  . Social connections:    Talks on phone: Not on file    Gets together: Not on file    Attends religious service: Not on file    Active member of club or organization: Not on file    Attends meetings of clubs or organizations: Not on file    Relationship status: Not on file  . Intimate partner violence:    Fear of current or ex partner: Not on file    Emotionally abused: Not on file    Physically abused: Not on file    Forced sexual activity: Not on file  Other Topics Concern  . Not on file  Social History Narrative   Widowed 2005 after long time care for husband after stroke   Remarried but widowed again 11/18      No formal living will   Daughter Carney BernJean should be her health care POA   Has DNR already   No feeding tube if cognitively unaware   Review of Systems Appetite is very variable Seems to have still lost more weight---has lost muscle mass per measurement Occasionally gets heartburn--no dysphagia Bowels moving okay No urinary problems---wears pads just in case No skin breakdown    Objective:   Physical Exam  Constitutional:  Cachectic and wasting noted  Neck: No thyromegaly present.  Respiratory: Effort normal and breath sounds normal. No respiratory distress. She has no wheezes. She has no rales.  GI: Soft. There is no tenderness.  Musculoskeletal: She exhibits no edema.  Lymphadenopathy:    She has no cervical adenopathy.  Neurological:  Mild  confusion  Psychiatric:  "when I see an old man on the TV, I just want to cry. Cause I don't have anyone any more" (tearful)           Assessment & Plan:

## 2018-11-01 DIAGNOSIS — D500 Iron deficiency anemia secondary to blood loss (chronic): Secondary | ICD-10-CM | POA: Diagnosis not present

## 2018-11-01 DIAGNOSIS — I5020 Unspecified systolic (congestive) heart failure: Secondary | ICD-10-CM | POA: Diagnosis not present

## 2018-11-01 DIAGNOSIS — I480 Paroxysmal atrial fibrillation: Secondary | ICD-10-CM | POA: Diagnosis not present

## 2018-11-01 DIAGNOSIS — I252 Old myocardial infarction: Secondary | ICD-10-CM | POA: Diagnosis not present

## 2018-11-01 DIAGNOSIS — I110 Hypertensive heart disease with heart failure: Secondary | ICD-10-CM | POA: Diagnosis not present

## 2018-11-01 DIAGNOSIS — I2510 Atherosclerotic heart disease of native coronary artery without angina pectoris: Secondary | ICD-10-CM | POA: Diagnosis not present

## 2018-11-04 DIAGNOSIS — D500 Iron deficiency anemia secondary to blood loss (chronic): Secondary | ICD-10-CM | POA: Diagnosis not present

## 2018-11-04 DIAGNOSIS — I252 Old myocardial infarction: Secondary | ICD-10-CM | POA: Diagnosis not present

## 2018-11-04 DIAGNOSIS — I110 Hypertensive heart disease with heart failure: Secondary | ICD-10-CM | POA: Diagnosis not present

## 2018-11-04 DIAGNOSIS — I2510 Atherosclerotic heart disease of native coronary artery without angina pectoris: Secondary | ICD-10-CM | POA: Diagnosis not present

## 2018-11-04 DIAGNOSIS — I480 Paroxysmal atrial fibrillation: Secondary | ICD-10-CM | POA: Diagnosis not present

## 2018-11-04 DIAGNOSIS — I5020 Unspecified systolic (congestive) heart failure: Secondary | ICD-10-CM | POA: Diagnosis not present

## 2018-11-06 DIAGNOSIS — D500 Iron deficiency anemia secondary to blood loss (chronic): Secondary | ICD-10-CM | POA: Diagnosis not present

## 2018-11-06 DIAGNOSIS — I5020 Unspecified systolic (congestive) heart failure: Secondary | ICD-10-CM | POA: Diagnosis not present

## 2018-11-06 DIAGNOSIS — I2510 Atherosclerotic heart disease of native coronary artery without angina pectoris: Secondary | ICD-10-CM | POA: Diagnosis not present

## 2018-11-06 DIAGNOSIS — I252 Old myocardial infarction: Secondary | ICD-10-CM | POA: Diagnosis not present

## 2018-11-06 DIAGNOSIS — I480 Paroxysmal atrial fibrillation: Secondary | ICD-10-CM | POA: Diagnosis not present

## 2018-11-06 DIAGNOSIS — I110 Hypertensive heart disease with heart failure: Secondary | ICD-10-CM | POA: Diagnosis not present

## 2018-11-08 DIAGNOSIS — I5020 Unspecified systolic (congestive) heart failure: Secondary | ICD-10-CM | POA: Diagnosis not present

## 2018-11-08 DIAGNOSIS — I480 Paroxysmal atrial fibrillation: Secondary | ICD-10-CM | POA: Diagnosis not present

## 2018-11-08 DIAGNOSIS — I252 Old myocardial infarction: Secondary | ICD-10-CM | POA: Diagnosis not present

## 2018-11-08 DIAGNOSIS — I2510 Atherosclerotic heart disease of native coronary artery without angina pectoris: Secondary | ICD-10-CM | POA: Diagnosis not present

## 2018-11-08 DIAGNOSIS — D500 Iron deficiency anemia secondary to blood loss (chronic): Secondary | ICD-10-CM | POA: Diagnosis not present

## 2018-11-08 DIAGNOSIS — I110 Hypertensive heart disease with heart failure: Secondary | ICD-10-CM | POA: Diagnosis not present

## 2018-11-11 DIAGNOSIS — I110 Hypertensive heart disease with heart failure: Secondary | ICD-10-CM | POA: Diagnosis not present

## 2018-11-11 DIAGNOSIS — I252 Old myocardial infarction: Secondary | ICD-10-CM | POA: Diagnosis not present

## 2018-11-11 DIAGNOSIS — I480 Paroxysmal atrial fibrillation: Secondary | ICD-10-CM | POA: Diagnosis not present

## 2018-11-11 DIAGNOSIS — D500 Iron deficiency anemia secondary to blood loss (chronic): Secondary | ICD-10-CM | POA: Diagnosis not present

## 2018-11-11 DIAGNOSIS — I5020 Unspecified systolic (congestive) heart failure: Secondary | ICD-10-CM | POA: Diagnosis not present

## 2018-11-11 DIAGNOSIS — I2510 Atherosclerotic heart disease of native coronary artery without angina pectoris: Secondary | ICD-10-CM | POA: Diagnosis not present

## 2018-11-13 DIAGNOSIS — I252 Old myocardial infarction: Secondary | ICD-10-CM | POA: Diagnosis not present

## 2018-11-13 DIAGNOSIS — I2510 Atherosclerotic heart disease of native coronary artery without angina pectoris: Secondary | ICD-10-CM | POA: Diagnosis not present

## 2018-11-13 DIAGNOSIS — D500 Iron deficiency anemia secondary to blood loss (chronic): Secondary | ICD-10-CM | POA: Diagnosis not present

## 2018-11-13 DIAGNOSIS — I480 Paroxysmal atrial fibrillation: Secondary | ICD-10-CM | POA: Diagnosis not present

## 2018-11-13 DIAGNOSIS — I110 Hypertensive heart disease with heart failure: Secondary | ICD-10-CM | POA: Diagnosis not present

## 2018-11-13 DIAGNOSIS — I5020 Unspecified systolic (congestive) heart failure: Secondary | ICD-10-CM | POA: Diagnosis not present

## 2018-11-17 DIAGNOSIS — R296 Repeated falls: Secondary | ICD-10-CM | POA: Diagnosis not present

## 2018-11-17 DIAGNOSIS — R627 Adult failure to thrive: Secondary | ICD-10-CM | POA: Diagnosis not present

## 2018-11-17 DIAGNOSIS — I5020 Unspecified systolic (congestive) heart failure: Secondary | ICD-10-CM | POA: Diagnosis not present

## 2018-11-17 DIAGNOSIS — R634 Abnormal weight loss: Secondary | ICD-10-CM | POA: Diagnosis not present

## 2018-11-17 DIAGNOSIS — Z741 Need for assistance with personal care: Secondary | ICD-10-CM | POA: Diagnosis not present

## 2018-11-17 DIAGNOSIS — Z681 Body mass index (BMI) 19 or less, adult: Secondary | ICD-10-CM | POA: Diagnosis not present

## 2018-11-17 DIAGNOSIS — I252 Old myocardial infarction: Secondary | ICD-10-CM | POA: Diagnosis not present

## 2018-11-17 DIAGNOSIS — G4733 Obstructive sleep apnea (adult) (pediatric): Secondary | ICD-10-CM | POA: Diagnosis not present

## 2018-11-17 DIAGNOSIS — I480 Paroxysmal atrial fibrillation: Secondary | ICD-10-CM | POA: Diagnosis not present

## 2018-11-17 DIAGNOSIS — I110 Hypertensive heart disease with heart failure: Secondary | ICD-10-CM | POA: Diagnosis not present

## 2018-11-17 DIAGNOSIS — Z8781 Personal history of (healed) traumatic fracture: Secondary | ICD-10-CM | POA: Diagnosis not present

## 2018-11-17 DIAGNOSIS — I2510 Atherosclerotic heart disease of native coronary artery without angina pectoris: Secondary | ICD-10-CM | POA: Diagnosis not present

## 2018-11-17 DIAGNOSIS — D500 Iron deficiency anemia secondary to blood loss (chronic): Secondary | ICD-10-CM | POA: Diagnosis not present

## 2018-11-18 DIAGNOSIS — I2510 Atherosclerotic heart disease of native coronary artery without angina pectoris: Secondary | ICD-10-CM | POA: Diagnosis not present

## 2018-11-18 DIAGNOSIS — I252 Old myocardial infarction: Secondary | ICD-10-CM | POA: Diagnosis not present

## 2018-11-18 DIAGNOSIS — I480 Paroxysmal atrial fibrillation: Secondary | ICD-10-CM | POA: Diagnosis not present

## 2018-11-18 DIAGNOSIS — D500 Iron deficiency anemia secondary to blood loss (chronic): Secondary | ICD-10-CM | POA: Diagnosis not present

## 2018-11-18 DIAGNOSIS — I5020 Unspecified systolic (congestive) heart failure: Secondary | ICD-10-CM | POA: Diagnosis not present

## 2018-11-18 DIAGNOSIS — I110 Hypertensive heart disease with heart failure: Secondary | ICD-10-CM | POA: Diagnosis not present

## 2018-11-20 DIAGNOSIS — I252 Old myocardial infarction: Secondary | ICD-10-CM | POA: Diagnosis not present

## 2018-11-20 DIAGNOSIS — I2510 Atherosclerotic heart disease of native coronary artery without angina pectoris: Secondary | ICD-10-CM | POA: Diagnosis not present

## 2018-11-20 DIAGNOSIS — I110 Hypertensive heart disease with heart failure: Secondary | ICD-10-CM | POA: Diagnosis not present

## 2018-11-20 DIAGNOSIS — D500 Iron deficiency anemia secondary to blood loss (chronic): Secondary | ICD-10-CM | POA: Diagnosis not present

## 2018-11-20 DIAGNOSIS — I480 Paroxysmal atrial fibrillation: Secondary | ICD-10-CM | POA: Diagnosis not present

## 2018-11-20 DIAGNOSIS — I5020 Unspecified systolic (congestive) heart failure: Secondary | ICD-10-CM | POA: Diagnosis not present

## 2018-11-22 DIAGNOSIS — I252 Old myocardial infarction: Secondary | ICD-10-CM | POA: Diagnosis not present

## 2018-11-22 DIAGNOSIS — D500 Iron deficiency anemia secondary to blood loss (chronic): Secondary | ICD-10-CM | POA: Diagnosis not present

## 2018-11-22 DIAGNOSIS — I5020 Unspecified systolic (congestive) heart failure: Secondary | ICD-10-CM | POA: Diagnosis not present

## 2018-11-22 DIAGNOSIS — I2510 Atherosclerotic heart disease of native coronary artery without angina pectoris: Secondary | ICD-10-CM | POA: Diagnosis not present

## 2018-11-22 DIAGNOSIS — I110 Hypertensive heart disease with heart failure: Secondary | ICD-10-CM | POA: Diagnosis not present

## 2018-11-22 DIAGNOSIS — I480 Paroxysmal atrial fibrillation: Secondary | ICD-10-CM | POA: Diagnosis not present

## 2018-11-25 ENCOUNTER — Other Ambulatory Visit: Payer: Self-pay | Admitting: Internal Medicine

## 2018-11-25 DIAGNOSIS — I252 Old myocardial infarction: Secondary | ICD-10-CM | POA: Diagnosis not present

## 2018-11-25 DIAGNOSIS — I480 Paroxysmal atrial fibrillation: Secondary | ICD-10-CM | POA: Diagnosis not present

## 2018-11-25 DIAGNOSIS — D500 Iron deficiency anemia secondary to blood loss (chronic): Secondary | ICD-10-CM | POA: Diagnosis not present

## 2018-11-25 DIAGNOSIS — I5020 Unspecified systolic (congestive) heart failure: Secondary | ICD-10-CM | POA: Diagnosis not present

## 2018-11-25 DIAGNOSIS — I2510 Atherosclerotic heart disease of native coronary artery without angina pectoris: Secondary | ICD-10-CM | POA: Diagnosis not present

## 2018-11-25 DIAGNOSIS — I110 Hypertensive heart disease with heart failure: Secondary | ICD-10-CM | POA: Diagnosis not present

## 2018-11-27 DIAGNOSIS — D500 Iron deficiency anemia secondary to blood loss (chronic): Secondary | ICD-10-CM | POA: Diagnosis not present

## 2018-11-27 DIAGNOSIS — I252 Old myocardial infarction: Secondary | ICD-10-CM | POA: Diagnosis not present

## 2018-11-27 DIAGNOSIS — I5020 Unspecified systolic (congestive) heart failure: Secondary | ICD-10-CM | POA: Diagnosis not present

## 2018-11-27 DIAGNOSIS — I110 Hypertensive heart disease with heart failure: Secondary | ICD-10-CM | POA: Diagnosis not present

## 2018-11-27 DIAGNOSIS — I2510 Atherosclerotic heart disease of native coronary artery without angina pectoris: Secondary | ICD-10-CM | POA: Diagnosis not present

## 2018-11-27 DIAGNOSIS — I480 Paroxysmal atrial fibrillation: Secondary | ICD-10-CM | POA: Diagnosis not present

## 2018-12-02 DIAGNOSIS — I5020 Unspecified systolic (congestive) heart failure: Secondary | ICD-10-CM | POA: Diagnosis not present

## 2018-12-02 DIAGNOSIS — I110 Hypertensive heart disease with heart failure: Secondary | ICD-10-CM | POA: Diagnosis not present

## 2018-12-02 DIAGNOSIS — D500 Iron deficiency anemia secondary to blood loss (chronic): Secondary | ICD-10-CM | POA: Diagnosis not present

## 2018-12-02 DIAGNOSIS — I2510 Atherosclerotic heart disease of native coronary artery without angina pectoris: Secondary | ICD-10-CM | POA: Diagnosis not present

## 2018-12-02 DIAGNOSIS — I480 Paroxysmal atrial fibrillation: Secondary | ICD-10-CM | POA: Diagnosis not present

## 2018-12-02 DIAGNOSIS — I252 Old myocardial infarction: Secondary | ICD-10-CM | POA: Diagnosis not present

## 2018-12-03 DIAGNOSIS — I480 Paroxysmal atrial fibrillation: Secondary | ICD-10-CM | POA: Diagnosis not present

## 2018-12-03 DIAGNOSIS — I252 Old myocardial infarction: Secondary | ICD-10-CM | POA: Diagnosis not present

## 2018-12-03 DIAGNOSIS — I2510 Atherosclerotic heart disease of native coronary artery without angina pectoris: Secondary | ICD-10-CM | POA: Diagnosis not present

## 2018-12-03 DIAGNOSIS — I110 Hypertensive heart disease with heart failure: Secondary | ICD-10-CM | POA: Diagnosis not present

## 2018-12-03 DIAGNOSIS — D500 Iron deficiency anemia secondary to blood loss (chronic): Secondary | ICD-10-CM | POA: Diagnosis not present

## 2018-12-03 DIAGNOSIS — I5020 Unspecified systolic (congestive) heart failure: Secondary | ICD-10-CM | POA: Diagnosis not present

## 2018-12-04 DIAGNOSIS — I5020 Unspecified systolic (congestive) heart failure: Secondary | ICD-10-CM | POA: Diagnosis not present

## 2018-12-04 DIAGNOSIS — I110 Hypertensive heart disease with heart failure: Secondary | ICD-10-CM | POA: Diagnosis not present

## 2018-12-04 DIAGNOSIS — D500 Iron deficiency anemia secondary to blood loss (chronic): Secondary | ICD-10-CM | POA: Diagnosis not present

## 2018-12-04 DIAGNOSIS — I252 Old myocardial infarction: Secondary | ICD-10-CM | POA: Diagnosis not present

## 2018-12-04 DIAGNOSIS — I480 Paroxysmal atrial fibrillation: Secondary | ICD-10-CM | POA: Diagnosis not present

## 2018-12-04 DIAGNOSIS — I2510 Atherosclerotic heart disease of native coronary artery without angina pectoris: Secondary | ICD-10-CM | POA: Diagnosis not present

## 2018-12-06 DIAGNOSIS — I480 Paroxysmal atrial fibrillation: Secondary | ICD-10-CM | POA: Diagnosis not present

## 2018-12-06 DIAGNOSIS — I2510 Atherosclerotic heart disease of native coronary artery without angina pectoris: Secondary | ICD-10-CM | POA: Diagnosis not present

## 2018-12-06 DIAGNOSIS — I110 Hypertensive heart disease with heart failure: Secondary | ICD-10-CM | POA: Diagnosis not present

## 2018-12-06 DIAGNOSIS — D500 Iron deficiency anemia secondary to blood loss (chronic): Secondary | ICD-10-CM | POA: Diagnosis not present

## 2018-12-06 DIAGNOSIS — I5020 Unspecified systolic (congestive) heart failure: Secondary | ICD-10-CM | POA: Diagnosis not present

## 2018-12-06 DIAGNOSIS — I252 Old myocardial infarction: Secondary | ICD-10-CM | POA: Diagnosis not present

## 2018-12-09 DIAGNOSIS — I480 Paroxysmal atrial fibrillation: Secondary | ICD-10-CM | POA: Diagnosis not present

## 2018-12-09 DIAGNOSIS — I110 Hypertensive heart disease with heart failure: Secondary | ICD-10-CM | POA: Diagnosis not present

## 2018-12-09 DIAGNOSIS — D500 Iron deficiency anemia secondary to blood loss (chronic): Secondary | ICD-10-CM | POA: Diagnosis not present

## 2018-12-09 DIAGNOSIS — I2510 Atherosclerotic heart disease of native coronary artery without angina pectoris: Secondary | ICD-10-CM | POA: Diagnosis not present

## 2018-12-09 DIAGNOSIS — I5020 Unspecified systolic (congestive) heart failure: Secondary | ICD-10-CM | POA: Diagnosis not present

## 2018-12-09 DIAGNOSIS — I252 Old myocardial infarction: Secondary | ICD-10-CM | POA: Diagnosis not present

## 2018-12-12 DIAGNOSIS — I252 Old myocardial infarction: Secondary | ICD-10-CM | POA: Diagnosis not present

## 2018-12-12 DIAGNOSIS — I5020 Unspecified systolic (congestive) heart failure: Secondary | ICD-10-CM | POA: Diagnosis not present

## 2018-12-12 DIAGNOSIS — I2510 Atherosclerotic heart disease of native coronary artery without angina pectoris: Secondary | ICD-10-CM | POA: Diagnosis not present

## 2018-12-12 DIAGNOSIS — I480 Paroxysmal atrial fibrillation: Secondary | ICD-10-CM | POA: Diagnosis not present

## 2018-12-12 DIAGNOSIS — D500 Iron deficiency anemia secondary to blood loss (chronic): Secondary | ICD-10-CM | POA: Diagnosis not present

## 2018-12-12 DIAGNOSIS — I110 Hypertensive heart disease with heart failure: Secondary | ICD-10-CM | POA: Diagnosis not present

## 2018-12-13 DIAGNOSIS — I2510 Atherosclerotic heart disease of native coronary artery without angina pectoris: Secondary | ICD-10-CM | POA: Diagnosis not present

## 2018-12-13 DIAGNOSIS — I5020 Unspecified systolic (congestive) heart failure: Secondary | ICD-10-CM | POA: Diagnosis not present

## 2018-12-13 DIAGNOSIS — I252 Old myocardial infarction: Secondary | ICD-10-CM | POA: Diagnosis not present

## 2018-12-13 DIAGNOSIS — I480 Paroxysmal atrial fibrillation: Secondary | ICD-10-CM | POA: Diagnosis not present

## 2018-12-13 DIAGNOSIS — D500 Iron deficiency anemia secondary to blood loss (chronic): Secondary | ICD-10-CM | POA: Diagnosis not present

## 2018-12-13 DIAGNOSIS — I110 Hypertensive heart disease with heart failure: Secondary | ICD-10-CM | POA: Diagnosis not present

## 2018-12-16 DIAGNOSIS — D500 Iron deficiency anemia secondary to blood loss (chronic): Secondary | ICD-10-CM | POA: Diagnosis not present

## 2018-12-16 DIAGNOSIS — I252 Old myocardial infarction: Secondary | ICD-10-CM | POA: Diagnosis not present

## 2018-12-16 DIAGNOSIS — I2510 Atherosclerotic heart disease of native coronary artery without angina pectoris: Secondary | ICD-10-CM | POA: Diagnosis not present

## 2018-12-16 DIAGNOSIS — I480 Paroxysmal atrial fibrillation: Secondary | ICD-10-CM | POA: Diagnosis not present

## 2018-12-16 DIAGNOSIS — I110 Hypertensive heart disease with heart failure: Secondary | ICD-10-CM | POA: Diagnosis not present

## 2018-12-16 DIAGNOSIS — I5020 Unspecified systolic (congestive) heart failure: Secondary | ICD-10-CM | POA: Diagnosis not present

## 2018-12-17 DIAGNOSIS — I110 Hypertensive heart disease with heart failure: Secondary | ICD-10-CM | POA: Diagnosis not present

## 2018-12-17 DIAGNOSIS — I252 Old myocardial infarction: Secondary | ICD-10-CM | POA: Diagnosis not present

## 2018-12-17 DIAGNOSIS — D500 Iron deficiency anemia secondary to blood loss (chronic): Secondary | ICD-10-CM | POA: Diagnosis not present

## 2018-12-17 DIAGNOSIS — I2510 Atherosclerotic heart disease of native coronary artery without angina pectoris: Secondary | ICD-10-CM | POA: Diagnosis not present

## 2018-12-17 DIAGNOSIS — I480 Paroxysmal atrial fibrillation: Secondary | ICD-10-CM | POA: Diagnosis not present

## 2018-12-17 DIAGNOSIS — I5020 Unspecified systolic (congestive) heart failure: Secondary | ICD-10-CM | POA: Diagnosis not present

## 2018-12-18 DIAGNOSIS — I252 Old myocardial infarction: Secondary | ICD-10-CM | POA: Diagnosis not present

## 2018-12-18 DIAGNOSIS — Z741 Need for assistance with personal care: Secondary | ICD-10-CM | POA: Diagnosis not present

## 2018-12-18 DIAGNOSIS — I480 Paroxysmal atrial fibrillation: Secondary | ICD-10-CM | POA: Diagnosis not present

## 2018-12-18 DIAGNOSIS — R296 Repeated falls: Secondary | ICD-10-CM | POA: Diagnosis not present

## 2018-12-18 DIAGNOSIS — Z8781 Personal history of (healed) traumatic fracture: Secondary | ICD-10-CM | POA: Diagnosis not present

## 2018-12-18 DIAGNOSIS — Z681 Body mass index (BMI) 19 or less, adult: Secondary | ICD-10-CM | POA: Diagnosis not present

## 2018-12-18 DIAGNOSIS — D500 Iron deficiency anemia secondary to blood loss (chronic): Secondary | ICD-10-CM | POA: Diagnosis not present

## 2018-12-18 DIAGNOSIS — R634 Abnormal weight loss: Secondary | ICD-10-CM | POA: Diagnosis not present

## 2018-12-18 DIAGNOSIS — G4733 Obstructive sleep apnea (adult) (pediatric): Secondary | ICD-10-CM | POA: Diagnosis not present

## 2018-12-18 DIAGNOSIS — R627 Adult failure to thrive: Secondary | ICD-10-CM | POA: Diagnosis not present

## 2018-12-18 DIAGNOSIS — I25119 Atherosclerotic heart disease of native coronary artery with unspecified angina pectoris: Secondary | ICD-10-CM | POA: Diagnosis not present

## 2018-12-18 DIAGNOSIS — I110 Hypertensive heart disease with heart failure: Secondary | ICD-10-CM | POA: Diagnosis not present

## 2018-12-18 DIAGNOSIS — I5020 Unspecified systolic (congestive) heart failure: Secondary | ICD-10-CM | POA: Diagnosis not present

## 2018-12-18 DIAGNOSIS — E785 Hyperlipidemia, unspecified: Secondary | ICD-10-CM | POA: Diagnosis not present

## 2018-12-20 DIAGNOSIS — E785 Hyperlipidemia, unspecified: Secondary | ICD-10-CM | POA: Diagnosis not present

## 2018-12-20 DIAGNOSIS — I110 Hypertensive heart disease with heart failure: Secondary | ICD-10-CM | POA: Diagnosis not present

## 2018-12-20 DIAGNOSIS — I25119 Atherosclerotic heart disease of native coronary artery with unspecified angina pectoris: Secondary | ICD-10-CM | POA: Diagnosis not present

## 2018-12-20 DIAGNOSIS — I252 Old myocardial infarction: Secondary | ICD-10-CM | POA: Diagnosis not present

## 2018-12-20 DIAGNOSIS — I5020 Unspecified systolic (congestive) heart failure: Secondary | ICD-10-CM | POA: Diagnosis not present

## 2018-12-20 DIAGNOSIS — I480 Paroxysmal atrial fibrillation: Secondary | ICD-10-CM | POA: Diagnosis not present

## 2018-12-23 DIAGNOSIS — E785 Hyperlipidemia, unspecified: Secondary | ICD-10-CM | POA: Diagnosis not present

## 2018-12-23 DIAGNOSIS — I252 Old myocardial infarction: Secondary | ICD-10-CM | POA: Diagnosis not present

## 2018-12-23 DIAGNOSIS — I110 Hypertensive heart disease with heart failure: Secondary | ICD-10-CM | POA: Diagnosis not present

## 2018-12-23 DIAGNOSIS — I5020 Unspecified systolic (congestive) heart failure: Secondary | ICD-10-CM | POA: Diagnosis not present

## 2018-12-23 DIAGNOSIS — I25119 Atherosclerotic heart disease of native coronary artery with unspecified angina pectoris: Secondary | ICD-10-CM | POA: Diagnosis not present

## 2018-12-23 DIAGNOSIS — I480 Paroxysmal atrial fibrillation: Secondary | ICD-10-CM | POA: Diagnosis not present

## 2018-12-25 DIAGNOSIS — E785 Hyperlipidemia, unspecified: Secondary | ICD-10-CM | POA: Diagnosis not present

## 2018-12-25 DIAGNOSIS — I25119 Atherosclerotic heart disease of native coronary artery with unspecified angina pectoris: Secondary | ICD-10-CM | POA: Diagnosis not present

## 2018-12-25 DIAGNOSIS — I110 Hypertensive heart disease with heart failure: Secondary | ICD-10-CM | POA: Diagnosis not present

## 2018-12-25 DIAGNOSIS — I5020 Unspecified systolic (congestive) heart failure: Secondary | ICD-10-CM | POA: Diagnosis not present

## 2018-12-25 DIAGNOSIS — I480 Paroxysmal atrial fibrillation: Secondary | ICD-10-CM | POA: Diagnosis not present

## 2018-12-25 DIAGNOSIS — I252 Old myocardial infarction: Secondary | ICD-10-CM | POA: Diagnosis not present

## 2018-12-27 DIAGNOSIS — E785 Hyperlipidemia, unspecified: Secondary | ICD-10-CM | POA: Diagnosis not present

## 2018-12-27 DIAGNOSIS — I25119 Atherosclerotic heart disease of native coronary artery with unspecified angina pectoris: Secondary | ICD-10-CM | POA: Diagnosis not present

## 2018-12-27 DIAGNOSIS — I252 Old myocardial infarction: Secondary | ICD-10-CM | POA: Diagnosis not present

## 2018-12-27 DIAGNOSIS — I110 Hypertensive heart disease with heart failure: Secondary | ICD-10-CM | POA: Diagnosis not present

## 2018-12-27 DIAGNOSIS — I480 Paroxysmal atrial fibrillation: Secondary | ICD-10-CM | POA: Diagnosis not present

## 2018-12-27 DIAGNOSIS — I5020 Unspecified systolic (congestive) heart failure: Secondary | ICD-10-CM | POA: Diagnosis not present

## 2018-12-30 DIAGNOSIS — I110 Hypertensive heart disease with heart failure: Secondary | ICD-10-CM | POA: Diagnosis not present

## 2018-12-30 DIAGNOSIS — I252 Old myocardial infarction: Secondary | ICD-10-CM | POA: Diagnosis not present

## 2018-12-30 DIAGNOSIS — I480 Paroxysmal atrial fibrillation: Secondary | ICD-10-CM | POA: Diagnosis not present

## 2018-12-30 DIAGNOSIS — E785 Hyperlipidemia, unspecified: Secondary | ICD-10-CM | POA: Diagnosis not present

## 2018-12-30 DIAGNOSIS — I25119 Atherosclerotic heart disease of native coronary artery with unspecified angina pectoris: Secondary | ICD-10-CM | POA: Diagnosis not present

## 2018-12-30 DIAGNOSIS — I5020 Unspecified systolic (congestive) heart failure: Secondary | ICD-10-CM | POA: Diagnosis not present

## 2019-01-01 DIAGNOSIS — E785 Hyperlipidemia, unspecified: Secondary | ICD-10-CM | POA: Diagnosis not present

## 2019-01-01 DIAGNOSIS — I480 Paroxysmal atrial fibrillation: Secondary | ICD-10-CM | POA: Diagnosis not present

## 2019-01-01 DIAGNOSIS — I5020 Unspecified systolic (congestive) heart failure: Secondary | ICD-10-CM | POA: Diagnosis not present

## 2019-01-01 DIAGNOSIS — I110 Hypertensive heart disease with heart failure: Secondary | ICD-10-CM | POA: Diagnosis not present

## 2019-01-01 DIAGNOSIS — I25119 Atherosclerotic heart disease of native coronary artery with unspecified angina pectoris: Secondary | ICD-10-CM | POA: Diagnosis not present

## 2019-01-01 DIAGNOSIS — I252 Old myocardial infarction: Secondary | ICD-10-CM | POA: Diagnosis not present

## 2019-01-03 DIAGNOSIS — E785 Hyperlipidemia, unspecified: Secondary | ICD-10-CM | POA: Diagnosis not present

## 2019-01-03 DIAGNOSIS — I25119 Atherosclerotic heart disease of native coronary artery with unspecified angina pectoris: Secondary | ICD-10-CM | POA: Diagnosis not present

## 2019-01-03 DIAGNOSIS — I480 Paroxysmal atrial fibrillation: Secondary | ICD-10-CM | POA: Diagnosis not present

## 2019-01-03 DIAGNOSIS — I5020 Unspecified systolic (congestive) heart failure: Secondary | ICD-10-CM | POA: Diagnosis not present

## 2019-01-03 DIAGNOSIS — I252 Old myocardial infarction: Secondary | ICD-10-CM | POA: Diagnosis not present

## 2019-01-03 DIAGNOSIS — I110 Hypertensive heart disease with heart failure: Secondary | ICD-10-CM | POA: Diagnosis not present

## 2019-01-06 DIAGNOSIS — E785 Hyperlipidemia, unspecified: Secondary | ICD-10-CM | POA: Diagnosis not present

## 2019-01-06 DIAGNOSIS — I480 Paroxysmal atrial fibrillation: Secondary | ICD-10-CM | POA: Diagnosis not present

## 2019-01-06 DIAGNOSIS — I5020 Unspecified systolic (congestive) heart failure: Secondary | ICD-10-CM | POA: Diagnosis not present

## 2019-01-06 DIAGNOSIS — I25119 Atherosclerotic heart disease of native coronary artery with unspecified angina pectoris: Secondary | ICD-10-CM | POA: Diagnosis not present

## 2019-01-06 DIAGNOSIS — I252 Old myocardial infarction: Secondary | ICD-10-CM | POA: Diagnosis not present

## 2019-01-06 DIAGNOSIS — I110 Hypertensive heart disease with heart failure: Secondary | ICD-10-CM | POA: Diagnosis not present

## 2019-01-08 ENCOUNTER — Encounter: Payer: Self-pay | Admitting: Internal Medicine

## 2019-01-08 ENCOUNTER — Ambulatory Visit: Payer: Medicare Other | Admitting: Internal Medicine

## 2019-01-08 VITALS — BP 116/60 | HR 72 | Resp 24 | Wt 79.0 lb

## 2019-01-08 DIAGNOSIS — I480 Paroxysmal atrial fibrillation: Secondary | ICD-10-CM

## 2019-01-08 DIAGNOSIS — I252 Old myocardial infarction: Secondary | ICD-10-CM | POA: Diagnosis not present

## 2019-01-08 DIAGNOSIS — F39 Unspecified mood [affective] disorder: Secondary | ICD-10-CM

## 2019-01-08 DIAGNOSIS — I6523 Occlusion and stenosis of bilateral carotid arteries: Secondary | ICD-10-CM | POA: Diagnosis not present

## 2019-01-08 DIAGNOSIS — I25119 Atherosclerotic heart disease of native coronary artery with unspecified angina pectoris: Secondary | ICD-10-CM

## 2019-01-08 DIAGNOSIS — I5020 Unspecified systolic (congestive) heart failure: Secondary | ICD-10-CM | POA: Diagnosis not present

## 2019-01-08 DIAGNOSIS — I5022 Chronic systolic (congestive) heart failure: Secondary | ICD-10-CM

## 2019-01-08 DIAGNOSIS — E785 Hyperlipidemia, unspecified: Secondary | ICD-10-CM | POA: Diagnosis not present

## 2019-01-08 DIAGNOSIS — I110 Hypertensive heart disease with heart failure: Secondary | ICD-10-CM | POA: Diagnosis not present

## 2019-01-08 DIAGNOSIS — E440 Moderate protein-calorie malnutrition: Secondary | ICD-10-CM

## 2019-01-08 DIAGNOSIS — F22 Delusional disorders: Secondary | ICD-10-CM | POA: Diagnosis not present

## 2019-01-08 NOTE — Assessment & Plan Note (Signed)
Continues but not as bad since on the risperidone No apparent sedation on this

## 2019-01-08 NOTE — Assessment & Plan Note (Signed)
Some anxiety No MDD Has the lorazepam for prn

## 2019-01-08 NOTE — Assessment & Plan Note (Signed)
Eats well but still losing weight Won't take boost or ensure----rarely will have a small milkshake

## 2019-01-08 NOTE — Progress Notes (Signed)
Subjective:    Patient ID: Christina [name], female    DOB: 06/19/1920, 85 y.o.   MRN: [id number]  HPI Home visit for home bound hospice patient for review of chronic medical conditions Daughter [name] here  Janet---Hospice social worker here  She feels she is "about the same" Stands to transfer with max assist of 1 Sleeps in bed----spends all day on couch Aide for showers 3 days per week Calls daughter when she needs bathroom--generally continent  Gets chest pain "once in a while" Has used the nitro at times--but not the morphine Breathing is okay--but not much exertion Frequent dizziness--even just sitting (if she tries to sit up) No palpitations No edema  Still some paranoia Thinks daughter is 2 different people Seems about the same on the risperidone (getting it every night) Hasn't been using the lorazepam Only occasional bad spells  Some arthritis pain Mostly gets tylenol Rarely the tramadol  Daughter [name] is ready to hire extra help Does have someone from the church who comes occasionally for 2-3 hours (but only as [employer])  No focal weakness No vision loss  Current Outpatient Medications on File Prior to Visit  Medication Sig Dispense Refill  . acetaminophen (TYLENOL 8 HOUR) 650 MG CR tablet Take 650-1,300 mg by mouth every 8 (eight) hours as needed for pain. Do not exceed 3000 mg / 24 hrs of acetaminophen. Consider all sources    . LORazepam (ATIVAN) 0.5 MG tablet Take 0.5 mg by mouth 2 (two) times daily as needed for anxiety.    . Morphine Sulfate (MORPHINE CONCENTRATE) 10 mg / 0.5 ml concentrated solution Take 0.25-0.5 mLs (5-10 mg total) by mouth every 3 (three) hours as needed for severe pain or shortness of breath. 30 mL 0  . nitroGLYCERIN (NITROSTAT) 0.4 MG SL tablet PLACE 1 TABLET (0.4 MG TOTAL) UNDER THE TONGUE EVERY 5 (FIVE) MINUTES AS NEEDED FOR CHEST PAIN. 25 tablet 0  . risperiDONE (RISPERDAL) 0.25 MG tablet TAKE 1 TABLET (0.25 MG TOTAL) BY MOUTH  AT BEDTIME AS NEEDED. 90 tablet 1  . senna (SENOKOT) 8.6 MG TABS tablet Take 1 tablet by mouth 2 (two) times daily.    . traMADol (ULTRAM) 50 MG tablet TAKE 0.5-1 TABLETS (25-50 MG TOTAL) BY MOUTH 3 (THREE) TIMES DAILY AS NEEDED. (Patient taking differently: Take 25-50 mg by mouth 3 (three) times daily as needed for moderate pain. ) 60 tablet 0   No current facility-administered medications on file prior to visit.     Allergies  Allergen Reactions  . Bee Venom Rash  . Plavix [Clopidogrel] Rash  . Atorvastatin Other (See Comments)    Reaction:  Blisters on feet   . Diltiazem Hcl Other (See Comments)    Reaction:  Unknown   . Dye Fdc Red [Red Dye] Other (See Comments)    Reaction:  Unknown   . Gemfibrozil Other (See Comments)    Reaction:  Unknown   . Niacin Other (See Comments)    Reaction:  Unknown   . Sulfasalazine Rash    Past Medical History:  Diagnosis Date  . CAD (coronary artery disease)    a. S/P previous MI;  b. 04/2007 Cath/PCI: Taxus DES' to mid/distal RCA and RPDA as well as Ramus;  c. myoview 8/09: EF 78%, normal perfusion; d. 12/2012 Low risk MV; e. 08/2017 NSTEMI->Med Rx.  . Carotid stenosis    Bilateral, mild to moderate  . Chronic back pain   . Chronic shoulder pain   .  HFrEF (heart failure with reduced ejection fraction) (HCC)    a. 08/2017 Echo: EF 20-25%, mid-apicalanteroseptal, ant, antlat, and apical sev HK. Mild MR, mildly dil LA, nl RV fxn.  . Hyperlipidemia    Intolerant of many statins  . Hypertension   . Ischemic cardiomyopathy    a. 08/2017 Echo: EF 20-25%, mid-apicalanteroseptal, ant, antlat, and apical sev HK.  . Mild mitral and aortic regurgitation    a. 11/2011 Echo: EF 55-65%, No RWMA, Gr 1 DD, Mild AI/MR; b. 08/2017 Echo: Mild MR.  Marland Kitchen Neuropathy   . Osteoarthritis   . Osteoporosis   . PAF (paroxysmal atrial fibrillation) (HCC)    a. 08/2017 - noted @ time of NSTEMI. CHA2DS2VASc = 5-->ASA only due to age and h/o falls;  c. On Amio.  Marland Kitchen Pelvic  fracture (HCC) 6/16  . Peripheral vascular disease [facility])     Past Surgical History:  Procedure Laterality Date  . 2 broken arms     . ABDOMINAL HYSTERECTOMY    . ADENOSINE MYOVIEW  01/2006   No ischemia, EF 70%  . BREAST BIOPSY     Bilateral  . CAROTID U/S  10/2006   0-39% bilaterally  . CATARACT EXTRACTION  09/2006   Right  . CORONARY ANGIOPLASTY  12/2002   Medical rx only  . CORONARY ANGIOPLASTY  01/2006   LAD/PAD disease  . CORONARY ANGIOPLASTY  03/2007   3 vessel disease  . CORONARY STENT PLACEMENT  2002   MI, [facility]  . CORONARY STENT PLACEMENT  04/2007   RCA/LCX multiple, [facility]  . CP ADMIT  09/2005   Stress myoview negative  . FEMORAL HERNIA REPAIR  05/2007   Incarcerated, right  . FRACTURE SURGERY  03/2009   Left leg  . hip fracture repair Bilateral   . INTRAMEDULLARY (IM) NAIL INTERTROCHANTERIC Right 05/18/2016   Procedure: INTRAMEDULLARY (IM) NAIL INTERTROCHANTRIC;  Surgeon: [name], MD;  Location: [facility] ORS;  Service: Orthopedics;  Laterality: Right;  . INTRAMEDULLARY (IM) NAIL INTERTROCHANTERIC Left 06/18/2017   Procedure: INTRAMEDULLARY (IM) NAIL INTERTROCHANTRIC;  Surgeon: Deeann Saint, MD;  Location: [facility] ORS;  Service: Orthopedics;  Laterality: Left;  . NM MYOVIEW LTD  11/2004   Stress, negative; NL EF  . two broken wrist on left arm,    . VESICOVAGINAL FISTULA CLOSURE W/ TAH    . VISCERAL ARTERY INTERVENTION N/A 04/22/2018   Procedure: VISCERAL ARTERY INTERVENTION;  Surgeon: Annice Needy, MD;  Location: [facility] INVASIVE CV LAB;  Service: Cardiovascular;  Laterality: N/A;    Family History  Problem Relation Age of Onset  . Heart disease Mother   . Pulmonary embolism Father   . Heart disease Sister   . Cancer Brother   . Cancer Sister     Social History   Socioeconomic History  . Marital status: Widowed    Spouse name: Not on file  . Number of children: 3  . Years of education: 87  . Highest education level: Not on file  Occupational History      Comment: retired from retail  Social Needs  . Financial resource strain: Not on file  . Food insecurity:    Worry: Not on file    Inability: Not on file  . Transportation needs:    Medical: Not on file    Non-medical: Not on file  Tobacco Use  . Smoking status: Never Smoker  . Smokeless tobacco: Never Used  Substance and Sexual Activity  . Alcohol use: No  . Drug use: No  .  Sexual activity: Not on file  Lifestyle  . Physical activity:    Days per week: Not on file    Minutes per session: Not on file  . Stress: Not on file  Relationships  . Social connections:    Talks on phone: Not on file    Gets together: Not on file    Attends religious service: Not on file    Active member of club or organization: Not on file    Attends meetings of clubs or organizations: Not on file    Relationship status: Not on file  . Intimate partner violence:    Fear of current or ex partner: Not on file    Emotionally abused: Not on file    Physically abused: Not on file    Forced sexual activity: Not on file  Other Topics Concern  . Not on file  Social History Narrative   Widowed 2005 after long time care for husband after stroke   Remarried but widowed again 11/18      No formal living will   Daughter Carney BernJean should be her health care POA   Has DNR already   No feeding tube if cognitively unaware   Review of Systems Eats well--but weight continues to drop Sleeps fine in day--not good at night often. Carney BernJean has given her melatonin 3mg  at times Bowels okay ----gets stool softener if she misses a day Voids okay No skin breakdown or ulcers    Objective:   Physical Exam  Constitutional: No distress.  Clear wasting  Neck: No thyromegaly present.  Cardiovascular: Normal rate, regular rhythm and normal heart sounds. Exam reveals no gallop.  No murmur heard. Respiratory: Effort normal and breath sounds normal. No respiratory distress. She has no wheezes. She has no rales.  GI: Soft.  There is no abdominal tenderness.  Musculoskeletal:        General: No edema.  Lymphadenopathy:    She has no cervical adenopathy.  Psychiatric:  Still some harsh words for her daughter ---no verbalization about her trying to poison her, etc though           Assessment & Plan:

## 2019-01-08 NOTE — Assessment & Plan Note (Signed)
End stage with very little endurance Needs help with all ADLs No fluid overload though off diuretics

## 2019-01-08 NOTE — Assessment & Plan Note (Signed)
No TIA or symptoms of stroke

## 2019-01-08 NOTE — Assessment & Plan Note (Signed)
Fairly frequent angina Nitro helps

## 2019-01-08 NOTE — Assessment & Plan Note (Signed)
No recent spells--at least by history

## 2019-01-10 DIAGNOSIS — I25119 Atherosclerotic heart disease of native coronary artery with unspecified angina pectoris: Secondary | ICD-10-CM | POA: Diagnosis not present

## 2019-01-10 DIAGNOSIS — I5020 Unspecified systolic (congestive) heart failure: Secondary | ICD-10-CM | POA: Diagnosis not present

## 2019-01-10 DIAGNOSIS — I480 Paroxysmal atrial fibrillation: Secondary | ICD-10-CM | POA: Diagnosis not present

## 2019-01-10 DIAGNOSIS — I110 Hypertensive heart disease with heart failure: Secondary | ICD-10-CM | POA: Diagnosis not present

## 2019-01-10 DIAGNOSIS — E785 Hyperlipidemia, unspecified: Secondary | ICD-10-CM | POA: Diagnosis not present

## 2019-01-10 DIAGNOSIS — I252 Old myocardial infarction: Secondary | ICD-10-CM | POA: Diagnosis not present

## 2019-01-12 ENCOUNTER — Other Ambulatory Visit: Payer: Self-pay | Admitting: Internal Medicine

## 2019-01-13 DIAGNOSIS — I252 Old myocardial infarction: Secondary | ICD-10-CM | POA: Diagnosis not present

## 2019-01-13 DIAGNOSIS — I110 Hypertensive heart disease with heart failure: Secondary | ICD-10-CM | POA: Diagnosis not present

## 2019-01-13 DIAGNOSIS — I5020 Unspecified systolic (congestive) heart failure: Secondary | ICD-10-CM | POA: Diagnosis not present

## 2019-01-13 DIAGNOSIS — I480 Paroxysmal atrial fibrillation: Secondary | ICD-10-CM | POA: Diagnosis not present

## 2019-01-13 DIAGNOSIS — E785 Hyperlipidemia, unspecified: Secondary | ICD-10-CM | POA: Diagnosis not present

## 2019-01-13 DIAGNOSIS — I25119 Atherosclerotic heart disease of native coronary artery with unspecified angina pectoris: Secondary | ICD-10-CM | POA: Diagnosis not present

## 2019-01-15 DIAGNOSIS — I25119 Atherosclerotic heart disease of native coronary artery with unspecified angina pectoris: Secondary | ICD-10-CM | POA: Diagnosis not present

## 2019-01-15 DIAGNOSIS — I5020 Unspecified systolic (congestive) heart failure: Secondary | ICD-10-CM | POA: Diagnosis not present

## 2019-01-15 DIAGNOSIS — I480 Paroxysmal atrial fibrillation: Secondary | ICD-10-CM | POA: Diagnosis not present

## 2019-01-15 DIAGNOSIS — I110 Hypertensive heart disease with heart failure: Secondary | ICD-10-CM | POA: Diagnosis not present

## 2019-01-15 DIAGNOSIS — I252 Old myocardial infarction: Secondary | ICD-10-CM | POA: Diagnosis not present

## 2019-01-15 DIAGNOSIS — E785 Hyperlipidemia, unspecified: Secondary | ICD-10-CM | POA: Diagnosis not present

## 2019-01-16 DIAGNOSIS — E785 Hyperlipidemia, unspecified: Secondary | ICD-10-CM | POA: Diagnosis not present

## 2019-01-16 DIAGNOSIS — I480 Paroxysmal atrial fibrillation: Secondary | ICD-10-CM | POA: Diagnosis not present

## 2019-01-16 DIAGNOSIS — I110 Hypertensive heart disease with heart failure: Secondary | ICD-10-CM | POA: Diagnosis not present

## 2019-01-16 DIAGNOSIS — I252 Old myocardial infarction: Secondary | ICD-10-CM | POA: Diagnosis not present

## 2019-01-16 DIAGNOSIS — I5020 Unspecified systolic (congestive) heart failure: Secondary | ICD-10-CM | POA: Diagnosis not present

## 2019-01-16 DIAGNOSIS — I25119 Atherosclerotic heart disease of native coronary artery with unspecified angina pectoris: Secondary | ICD-10-CM | POA: Diagnosis not present

## 2019-01-17 DIAGNOSIS — E785 Hyperlipidemia, unspecified: Secondary | ICD-10-CM | POA: Diagnosis not present

## 2019-01-17 DIAGNOSIS — I110 Hypertensive heart disease with heart failure: Secondary | ICD-10-CM | POA: Diagnosis not present

## 2019-01-17 DIAGNOSIS — I480 Paroxysmal atrial fibrillation: Secondary | ICD-10-CM | POA: Diagnosis not present

## 2019-01-17 DIAGNOSIS — I25119 Atherosclerotic heart disease of native coronary artery with unspecified angina pectoris: Secondary | ICD-10-CM | POA: Diagnosis not present

## 2019-01-17 DIAGNOSIS — I5020 Unspecified systolic (congestive) heart failure: Secondary | ICD-10-CM | POA: Diagnosis not present

## 2019-01-17 DIAGNOSIS — I252 Old myocardial infarction: Secondary | ICD-10-CM | POA: Diagnosis not present

## 2019-01-18 DIAGNOSIS — Z8781 Personal history of (healed) traumatic fracture: Secondary | ICD-10-CM | POA: Diagnosis not present

## 2019-01-18 DIAGNOSIS — E785 Hyperlipidemia, unspecified: Secondary | ICD-10-CM | POA: Diagnosis not present

## 2019-01-18 DIAGNOSIS — R634 Abnormal weight loss: Secondary | ICD-10-CM | POA: Diagnosis not present

## 2019-01-18 DIAGNOSIS — G4733 Obstructive sleep apnea (adult) (pediatric): Secondary | ICD-10-CM | POA: Diagnosis not present

## 2019-01-18 DIAGNOSIS — I480 Paroxysmal atrial fibrillation: Secondary | ICD-10-CM | POA: Diagnosis not present

## 2019-01-18 DIAGNOSIS — I5020 Unspecified systolic (congestive) heart failure: Secondary | ICD-10-CM | POA: Diagnosis not present

## 2019-01-18 DIAGNOSIS — Z741 Need for assistance with personal care: Secondary | ICD-10-CM | POA: Diagnosis not present

## 2019-01-18 DIAGNOSIS — I252 Old myocardial infarction: Secondary | ICD-10-CM | POA: Diagnosis not present

## 2019-01-18 DIAGNOSIS — R627 Adult failure to thrive: Secondary | ICD-10-CM | POA: Diagnosis not present

## 2019-01-18 DIAGNOSIS — I110 Hypertensive heart disease with heart failure: Secondary | ICD-10-CM | POA: Diagnosis not present

## 2019-01-18 DIAGNOSIS — R296 Repeated falls: Secondary | ICD-10-CM | POA: Diagnosis not present

## 2019-01-18 DIAGNOSIS — I25119 Atherosclerotic heart disease of native coronary artery with unspecified angina pectoris: Secondary | ICD-10-CM | POA: Diagnosis not present

## 2019-01-18 DIAGNOSIS — Z681 Body mass index (BMI) 19 or less, adult: Secondary | ICD-10-CM | POA: Diagnosis not present

## 2019-01-18 DIAGNOSIS — D500 Iron deficiency anemia secondary to blood loss (chronic): Secondary | ICD-10-CM | POA: Diagnosis not present

## 2019-01-20 DIAGNOSIS — I480 Paroxysmal atrial fibrillation: Secondary | ICD-10-CM | POA: Diagnosis not present

## 2019-01-20 DIAGNOSIS — I110 Hypertensive heart disease with heart failure: Secondary | ICD-10-CM | POA: Diagnosis not present

## 2019-01-20 DIAGNOSIS — I5020 Unspecified systolic (congestive) heart failure: Secondary | ICD-10-CM | POA: Diagnosis not present

## 2019-01-20 DIAGNOSIS — G4733 Obstructive sleep apnea (adult) (pediatric): Secondary | ICD-10-CM | POA: Diagnosis not present

## 2019-01-20 DIAGNOSIS — I252 Old myocardial infarction: Secondary | ICD-10-CM | POA: Diagnosis not present

## 2019-01-20 DIAGNOSIS — D500 Iron deficiency anemia secondary to blood loss (chronic): Secondary | ICD-10-CM | POA: Diagnosis not present

## 2019-01-22 DIAGNOSIS — D500 Iron deficiency anemia secondary to blood loss (chronic): Secondary | ICD-10-CM | POA: Diagnosis not present

## 2019-01-22 DIAGNOSIS — I480 Paroxysmal atrial fibrillation: Secondary | ICD-10-CM | POA: Diagnosis not present

## 2019-01-22 DIAGNOSIS — I110 Hypertensive heart disease with heart failure: Secondary | ICD-10-CM | POA: Diagnosis not present

## 2019-01-22 DIAGNOSIS — I252 Old myocardial infarction: Secondary | ICD-10-CM | POA: Diagnosis not present

## 2019-01-22 DIAGNOSIS — I5020 Unspecified systolic (congestive) heart failure: Secondary | ICD-10-CM | POA: Diagnosis not present

## 2019-01-22 DIAGNOSIS — G4733 Obstructive sleep apnea (adult) (pediatric): Secondary | ICD-10-CM | POA: Diagnosis not present

## 2019-01-24 DIAGNOSIS — I110 Hypertensive heart disease with heart failure: Secondary | ICD-10-CM | POA: Diagnosis not present

## 2019-01-24 DIAGNOSIS — G4733 Obstructive sleep apnea (adult) (pediatric): Secondary | ICD-10-CM | POA: Diagnosis not present

## 2019-01-24 DIAGNOSIS — I5020 Unspecified systolic (congestive) heart failure: Secondary | ICD-10-CM | POA: Diagnosis not present

## 2019-01-24 DIAGNOSIS — I252 Old myocardial infarction: Secondary | ICD-10-CM | POA: Diagnosis not present

## 2019-01-24 DIAGNOSIS — D500 Iron deficiency anemia secondary to blood loss (chronic): Secondary | ICD-10-CM | POA: Diagnosis not present

## 2019-01-24 DIAGNOSIS — I480 Paroxysmal atrial fibrillation: Secondary | ICD-10-CM | POA: Diagnosis not present

## 2019-01-27 DIAGNOSIS — I480 Paroxysmal atrial fibrillation: Secondary | ICD-10-CM | POA: Diagnosis not present

## 2019-01-27 DIAGNOSIS — I5020 Unspecified systolic (congestive) heart failure: Secondary | ICD-10-CM | POA: Diagnosis not present

## 2019-01-27 DIAGNOSIS — G4733 Obstructive sleep apnea (adult) (pediatric): Secondary | ICD-10-CM | POA: Diagnosis not present

## 2019-01-27 DIAGNOSIS — I110 Hypertensive heart disease with heart failure: Secondary | ICD-10-CM | POA: Diagnosis not present

## 2019-01-27 DIAGNOSIS — D500 Iron deficiency anemia secondary to blood loss (chronic): Secondary | ICD-10-CM | POA: Diagnosis not present

## 2019-01-27 DIAGNOSIS — I252 Old myocardial infarction: Secondary | ICD-10-CM | POA: Diagnosis not present

## 2019-01-28 DIAGNOSIS — D500 Iron deficiency anemia secondary to blood loss (chronic): Secondary | ICD-10-CM | POA: Diagnosis not present

## 2019-01-28 DIAGNOSIS — I5020 Unspecified systolic (congestive) heart failure: Secondary | ICD-10-CM | POA: Diagnosis not present

## 2019-01-28 DIAGNOSIS — G4733 Obstructive sleep apnea (adult) (pediatric): Secondary | ICD-10-CM | POA: Diagnosis not present

## 2019-01-28 DIAGNOSIS — I110 Hypertensive heart disease with heart failure: Secondary | ICD-10-CM | POA: Diagnosis not present

## 2019-01-28 DIAGNOSIS — I252 Old myocardial infarction: Secondary | ICD-10-CM | POA: Diagnosis not present

## 2019-01-28 DIAGNOSIS — I480 Paroxysmal atrial fibrillation: Secondary | ICD-10-CM | POA: Diagnosis not present

## 2019-01-29 DIAGNOSIS — I5020 Unspecified systolic (congestive) heart failure: Secondary | ICD-10-CM | POA: Diagnosis not present

## 2019-01-29 DIAGNOSIS — G4733 Obstructive sleep apnea (adult) (pediatric): Secondary | ICD-10-CM | POA: Diagnosis not present

## 2019-01-29 DIAGNOSIS — I480 Paroxysmal atrial fibrillation: Secondary | ICD-10-CM | POA: Diagnosis not present

## 2019-01-29 DIAGNOSIS — D500 Iron deficiency anemia secondary to blood loss (chronic): Secondary | ICD-10-CM | POA: Diagnosis not present

## 2019-01-29 DIAGNOSIS — I110 Hypertensive heart disease with heart failure: Secondary | ICD-10-CM | POA: Diagnosis not present

## 2019-01-29 DIAGNOSIS — I252 Old myocardial infarction: Secondary | ICD-10-CM | POA: Diagnosis not present

## 2019-01-31 DIAGNOSIS — I252 Old myocardial infarction: Secondary | ICD-10-CM | POA: Diagnosis not present

## 2019-01-31 DIAGNOSIS — D500 Iron deficiency anemia secondary to blood loss (chronic): Secondary | ICD-10-CM | POA: Diagnosis not present

## 2019-01-31 DIAGNOSIS — I110 Hypertensive heart disease with heart failure: Secondary | ICD-10-CM | POA: Diagnosis not present

## 2019-01-31 DIAGNOSIS — I5020 Unspecified systolic (congestive) heart failure: Secondary | ICD-10-CM | POA: Diagnosis not present

## 2019-01-31 DIAGNOSIS — I480 Paroxysmal atrial fibrillation: Secondary | ICD-10-CM | POA: Diagnosis not present

## 2019-01-31 DIAGNOSIS — G4733 Obstructive sleep apnea (adult) (pediatric): Secondary | ICD-10-CM | POA: Diagnosis not present

## 2019-02-03 DIAGNOSIS — I110 Hypertensive heart disease with heart failure: Secondary | ICD-10-CM | POA: Diagnosis not present

## 2019-02-03 DIAGNOSIS — G4733 Obstructive sleep apnea (adult) (pediatric): Secondary | ICD-10-CM | POA: Diagnosis not present

## 2019-02-03 DIAGNOSIS — I5020 Unspecified systolic (congestive) heart failure: Secondary | ICD-10-CM | POA: Diagnosis not present

## 2019-02-03 DIAGNOSIS — I480 Paroxysmal atrial fibrillation: Secondary | ICD-10-CM | POA: Diagnosis not present

## 2019-02-03 DIAGNOSIS — D500 Iron deficiency anemia secondary to blood loss (chronic): Secondary | ICD-10-CM | POA: Diagnosis not present

## 2019-02-03 DIAGNOSIS — I252 Old myocardial infarction: Secondary | ICD-10-CM | POA: Diagnosis not present

## 2019-02-05 DIAGNOSIS — D500 Iron deficiency anemia secondary to blood loss (chronic): Secondary | ICD-10-CM | POA: Diagnosis not present

## 2019-02-05 DIAGNOSIS — I480 Paroxysmal atrial fibrillation: Secondary | ICD-10-CM | POA: Diagnosis not present

## 2019-02-05 DIAGNOSIS — I5020 Unspecified systolic (congestive) heart failure: Secondary | ICD-10-CM | POA: Diagnosis not present

## 2019-02-05 DIAGNOSIS — G4733 Obstructive sleep apnea (adult) (pediatric): Secondary | ICD-10-CM | POA: Diagnosis not present

## 2019-02-05 DIAGNOSIS — I110 Hypertensive heart disease with heart failure: Secondary | ICD-10-CM | POA: Diagnosis not present

## 2019-02-05 DIAGNOSIS — I252 Old myocardial infarction: Secondary | ICD-10-CM | POA: Diagnosis not present

## 2019-02-07 DIAGNOSIS — I110 Hypertensive heart disease with heart failure: Secondary | ICD-10-CM | POA: Diagnosis not present

## 2019-02-07 DIAGNOSIS — D500 Iron deficiency anemia secondary to blood loss (chronic): Secondary | ICD-10-CM | POA: Diagnosis not present

## 2019-02-07 DIAGNOSIS — I480 Paroxysmal atrial fibrillation: Secondary | ICD-10-CM | POA: Diagnosis not present

## 2019-02-07 DIAGNOSIS — G4733 Obstructive sleep apnea (adult) (pediatric): Secondary | ICD-10-CM | POA: Diagnosis not present

## 2019-02-07 DIAGNOSIS — I252 Old myocardial infarction: Secondary | ICD-10-CM | POA: Diagnosis not present

## 2019-02-07 DIAGNOSIS — I5020 Unspecified systolic (congestive) heart failure: Secondary | ICD-10-CM | POA: Diagnosis not present

## 2019-02-10 DIAGNOSIS — I5020 Unspecified systolic (congestive) heart failure: Secondary | ICD-10-CM | POA: Diagnosis not present

## 2019-02-10 DIAGNOSIS — G4733 Obstructive sleep apnea (adult) (pediatric): Secondary | ICD-10-CM | POA: Diagnosis not present

## 2019-02-10 DIAGNOSIS — I480 Paroxysmal atrial fibrillation: Secondary | ICD-10-CM | POA: Diagnosis not present

## 2019-02-10 DIAGNOSIS — I110 Hypertensive heart disease with heart failure: Secondary | ICD-10-CM | POA: Diagnosis not present

## 2019-02-10 DIAGNOSIS — D500 Iron deficiency anemia secondary to blood loss (chronic): Secondary | ICD-10-CM | POA: Diagnosis not present

## 2019-02-10 DIAGNOSIS — I252 Old myocardial infarction: Secondary | ICD-10-CM | POA: Diagnosis not present

## 2019-02-12 DIAGNOSIS — I252 Old myocardial infarction: Secondary | ICD-10-CM | POA: Diagnosis not present

## 2019-02-12 DIAGNOSIS — G4733 Obstructive sleep apnea (adult) (pediatric): Secondary | ICD-10-CM | POA: Diagnosis not present

## 2019-02-12 DIAGNOSIS — I480 Paroxysmal atrial fibrillation: Secondary | ICD-10-CM | POA: Diagnosis not present

## 2019-02-12 DIAGNOSIS — I5020 Unspecified systolic (congestive) heart failure: Secondary | ICD-10-CM | POA: Diagnosis not present

## 2019-02-12 DIAGNOSIS — D500 Iron deficiency anemia secondary to blood loss (chronic): Secondary | ICD-10-CM | POA: Diagnosis not present

## 2019-02-12 DIAGNOSIS — I110 Hypertensive heart disease with heart failure: Secondary | ICD-10-CM | POA: Diagnosis not present

## 2019-02-16 DIAGNOSIS — Z8781 Personal history of (healed) traumatic fracture: Secondary | ICD-10-CM | POA: Diagnosis not present

## 2019-02-16 DIAGNOSIS — Z681 Body mass index (BMI) 19 or less, adult: Secondary | ICD-10-CM | POA: Diagnosis not present

## 2019-02-16 DIAGNOSIS — I480 Paroxysmal atrial fibrillation: Secondary | ICD-10-CM | POA: Diagnosis not present

## 2019-02-16 DIAGNOSIS — I252 Old myocardial infarction: Secondary | ICD-10-CM | POA: Diagnosis not present

## 2019-02-16 DIAGNOSIS — I5020 Unspecified systolic (congestive) heart failure: Secondary | ICD-10-CM | POA: Diagnosis not present

## 2019-02-16 DIAGNOSIS — D500 Iron deficiency anemia secondary to blood loss (chronic): Secondary | ICD-10-CM | POA: Diagnosis not present

## 2019-02-16 DIAGNOSIS — R634 Abnormal weight loss: Secondary | ICD-10-CM | POA: Diagnosis not present

## 2019-02-16 DIAGNOSIS — I110 Hypertensive heart disease with heart failure: Secondary | ICD-10-CM | POA: Diagnosis not present

## 2019-02-16 DIAGNOSIS — R627 Adult failure to thrive: Secondary | ICD-10-CM | POA: Diagnosis not present

## 2019-02-16 DIAGNOSIS — I25119 Atherosclerotic heart disease of native coronary artery with unspecified angina pectoris: Secondary | ICD-10-CM | POA: Diagnosis not present

## 2019-02-16 DIAGNOSIS — G4733 Obstructive sleep apnea (adult) (pediatric): Secondary | ICD-10-CM | POA: Diagnosis not present

## 2019-02-16 DIAGNOSIS — E785 Hyperlipidemia, unspecified: Secondary | ICD-10-CM | POA: Diagnosis not present

## 2019-02-16 DIAGNOSIS — Z741 Need for assistance with personal care: Secondary | ICD-10-CM | POA: Diagnosis not present

## 2019-02-16 DIAGNOSIS — R296 Repeated falls: Secondary | ICD-10-CM | POA: Diagnosis not present

## 2019-02-17 DIAGNOSIS — I5020 Unspecified systolic (congestive) heart failure: Secondary | ICD-10-CM | POA: Diagnosis not present

## 2019-02-17 DIAGNOSIS — G4733 Obstructive sleep apnea (adult) (pediatric): Secondary | ICD-10-CM | POA: Diagnosis not present

## 2019-02-17 DIAGNOSIS — I252 Old myocardial infarction: Secondary | ICD-10-CM | POA: Diagnosis not present

## 2019-02-17 DIAGNOSIS — I110 Hypertensive heart disease with heart failure: Secondary | ICD-10-CM | POA: Diagnosis not present

## 2019-02-17 DIAGNOSIS — I480 Paroxysmal atrial fibrillation: Secondary | ICD-10-CM | POA: Diagnosis not present

## 2019-02-17 DIAGNOSIS — D500 Iron deficiency anemia secondary to blood loss (chronic): Secondary | ICD-10-CM | POA: Diagnosis not present

## 2019-02-19 DIAGNOSIS — D500 Iron deficiency anemia secondary to blood loss (chronic): Secondary | ICD-10-CM | POA: Diagnosis not present

## 2019-02-19 DIAGNOSIS — G4733 Obstructive sleep apnea (adult) (pediatric): Secondary | ICD-10-CM | POA: Diagnosis not present

## 2019-02-19 DIAGNOSIS — I480 Paroxysmal atrial fibrillation: Secondary | ICD-10-CM | POA: Diagnosis not present

## 2019-02-19 DIAGNOSIS — I110 Hypertensive heart disease with heart failure: Secondary | ICD-10-CM | POA: Diagnosis not present

## 2019-02-19 DIAGNOSIS — I252 Old myocardial infarction: Secondary | ICD-10-CM | POA: Diagnosis not present

## 2019-02-19 DIAGNOSIS — I5020 Unspecified systolic (congestive) heart failure: Secondary | ICD-10-CM | POA: Diagnosis not present

## 2019-02-20 DIAGNOSIS — I252 Old myocardial infarction: Secondary | ICD-10-CM | POA: Diagnosis not present

## 2019-02-20 DIAGNOSIS — G4733 Obstructive sleep apnea (adult) (pediatric): Secondary | ICD-10-CM | POA: Diagnosis not present

## 2019-02-20 DIAGNOSIS — I110 Hypertensive heart disease with heart failure: Secondary | ICD-10-CM | POA: Diagnosis not present

## 2019-02-20 DIAGNOSIS — I5020 Unspecified systolic (congestive) heart failure: Secondary | ICD-10-CM | POA: Diagnosis not present

## 2019-02-20 DIAGNOSIS — D500 Iron deficiency anemia secondary to blood loss (chronic): Secondary | ICD-10-CM | POA: Diagnosis not present

## 2019-02-20 DIAGNOSIS — I480 Paroxysmal atrial fibrillation: Secondary | ICD-10-CM | POA: Diagnosis not present

## 2019-02-21 DIAGNOSIS — G4733 Obstructive sleep apnea (adult) (pediatric): Secondary | ICD-10-CM | POA: Diagnosis not present

## 2019-02-21 DIAGNOSIS — I480 Paroxysmal atrial fibrillation: Secondary | ICD-10-CM | POA: Diagnosis not present

## 2019-02-21 DIAGNOSIS — I252 Old myocardial infarction: Secondary | ICD-10-CM | POA: Diagnosis not present

## 2019-02-21 DIAGNOSIS — D500 Iron deficiency anemia secondary to blood loss (chronic): Secondary | ICD-10-CM | POA: Diagnosis not present

## 2019-02-21 DIAGNOSIS — I5020 Unspecified systolic (congestive) heart failure: Secondary | ICD-10-CM | POA: Diagnosis not present

## 2019-02-21 DIAGNOSIS — I110 Hypertensive heart disease with heart failure: Secondary | ICD-10-CM | POA: Diagnosis not present

## 2019-02-24 DIAGNOSIS — I110 Hypertensive heart disease with heart failure: Secondary | ICD-10-CM | POA: Diagnosis not present

## 2019-02-24 DIAGNOSIS — G4733 Obstructive sleep apnea (adult) (pediatric): Secondary | ICD-10-CM | POA: Diagnosis not present

## 2019-02-24 DIAGNOSIS — D500 Iron deficiency anemia secondary to blood loss (chronic): Secondary | ICD-10-CM | POA: Diagnosis not present

## 2019-02-24 DIAGNOSIS — I480 Paroxysmal atrial fibrillation: Secondary | ICD-10-CM | POA: Diagnosis not present

## 2019-02-24 DIAGNOSIS — I5020 Unspecified systolic (congestive) heart failure: Secondary | ICD-10-CM | POA: Diagnosis not present

## 2019-02-24 DIAGNOSIS — I252 Old myocardial infarction: Secondary | ICD-10-CM | POA: Diagnosis not present

## 2019-02-26 DIAGNOSIS — D500 Iron deficiency anemia secondary to blood loss (chronic): Secondary | ICD-10-CM | POA: Diagnosis not present

## 2019-02-26 DIAGNOSIS — I252 Old myocardial infarction: Secondary | ICD-10-CM | POA: Diagnosis not present

## 2019-02-26 DIAGNOSIS — I5020 Unspecified systolic (congestive) heart failure: Secondary | ICD-10-CM | POA: Diagnosis not present

## 2019-02-26 DIAGNOSIS — I110 Hypertensive heart disease with heart failure: Secondary | ICD-10-CM | POA: Diagnosis not present

## 2019-02-26 DIAGNOSIS — G4733 Obstructive sleep apnea (adult) (pediatric): Secondary | ICD-10-CM | POA: Diagnosis not present

## 2019-02-26 DIAGNOSIS — I480 Paroxysmal atrial fibrillation: Secondary | ICD-10-CM | POA: Diagnosis not present

## 2019-02-28 DIAGNOSIS — I480 Paroxysmal atrial fibrillation: Secondary | ICD-10-CM | POA: Diagnosis not present

## 2019-02-28 DIAGNOSIS — D500 Iron deficiency anemia secondary to blood loss (chronic): Secondary | ICD-10-CM | POA: Diagnosis not present

## 2019-02-28 DIAGNOSIS — I5020 Unspecified systolic (congestive) heart failure: Secondary | ICD-10-CM | POA: Diagnosis not present

## 2019-02-28 DIAGNOSIS — G4733 Obstructive sleep apnea (adult) (pediatric): Secondary | ICD-10-CM | POA: Diagnosis not present

## 2019-02-28 DIAGNOSIS — I252 Old myocardial infarction: Secondary | ICD-10-CM | POA: Diagnosis not present

## 2019-02-28 DIAGNOSIS — I110 Hypertensive heart disease with heart failure: Secondary | ICD-10-CM | POA: Diagnosis not present

## 2019-03-03 DIAGNOSIS — I110 Hypertensive heart disease with heart failure: Secondary | ICD-10-CM | POA: Diagnosis not present

## 2019-03-03 DIAGNOSIS — I252 Old myocardial infarction: Secondary | ICD-10-CM | POA: Diagnosis not present

## 2019-03-03 DIAGNOSIS — D500 Iron deficiency anemia secondary to blood loss (chronic): Secondary | ICD-10-CM | POA: Diagnosis not present

## 2019-03-03 DIAGNOSIS — G4733 Obstructive sleep apnea (adult) (pediatric): Secondary | ICD-10-CM | POA: Diagnosis not present

## 2019-03-03 DIAGNOSIS — I480 Paroxysmal atrial fibrillation: Secondary | ICD-10-CM | POA: Diagnosis not present

## 2019-03-03 DIAGNOSIS — I5020 Unspecified systolic (congestive) heart failure: Secondary | ICD-10-CM | POA: Diagnosis not present

## 2019-03-05 DIAGNOSIS — D500 Iron deficiency anemia secondary to blood loss (chronic): Secondary | ICD-10-CM | POA: Diagnosis not present

## 2019-03-05 DIAGNOSIS — I110 Hypertensive heart disease with heart failure: Secondary | ICD-10-CM | POA: Diagnosis not present

## 2019-03-05 DIAGNOSIS — I5020 Unspecified systolic (congestive) heart failure: Secondary | ICD-10-CM | POA: Diagnosis not present

## 2019-03-05 DIAGNOSIS — I480 Paroxysmal atrial fibrillation: Secondary | ICD-10-CM | POA: Diagnosis not present

## 2019-03-05 DIAGNOSIS — G4733 Obstructive sleep apnea (adult) (pediatric): Secondary | ICD-10-CM | POA: Diagnosis not present

## 2019-03-05 DIAGNOSIS — I252 Old myocardial infarction: Secondary | ICD-10-CM | POA: Diagnosis not present

## 2019-03-07 DIAGNOSIS — G4733 Obstructive sleep apnea (adult) (pediatric): Secondary | ICD-10-CM | POA: Diagnosis not present

## 2019-03-07 DIAGNOSIS — I480 Paroxysmal atrial fibrillation: Secondary | ICD-10-CM | POA: Diagnosis not present

## 2019-03-07 DIAGNOSIS — I5020 Unspecified systolic (congestive) heart failure: Secondary | ICD-10-CM | POA: Diagnosis not present

## 2019-03-07 DIAGNOSIS — I252 Old myocardial infarction: Secondary | ICD-10-CM | POA: Diagnosis not present

## 2019-03-07 DIAGNOSIS — D500 Iron deficiency anemia secondary to blood loss (chronic): Secondary | ICD-10-CM | POA: Diagnosis not present

## 2019-03-07 DIAGNOSIS — I110 Hypertensive heart disease with heart failure: Secondary | ICD-10-CM | POA: Diagnosis not present

## 2019-03-10 DIAGNOSIS — I480 Paroxysmal atrial fibrillation: Secondary | ICD-10-CM | POA: Diagnosis not present

## 2019-03-10 DIAGNOSIS — I252 Old myocardial infarction: Secondary | ICD-10-CM | POA: Diagnosis not present

## 2019-03-10 DIAGNOSIS — I110 Hypertensive heart disease with heart failure: Secondary | ICD-10-CM | POA: Diagnosis not present

## 2019-03-10 DIAGNOSIS — D500 Iron deficiency anemia secondary to blood loss (chronic): Secondary | ICD-10-CM | POA: Diagnosis not present

## 2019-03-10 DIAGNOSIS — G4733 Obstructive sleep apnea (adult) (pediatric): Secondary | ICD-10-CM | POA: Diagnosis not present

## 2019-03-10 DIAGNOSIS — I5020 Unspecified systolic (congestive) heart failure: Secondary | ICD-10-CM | POA: Diagnosis not present

## 2019-03-12 ENCOUNTER — Encounter: Payer: Self-pay | Admitting: Internal Medicine

## 2019-03-12 ENCOUNTER — Other Ambulatory Visit: Payer: Self-pay

## 2019-03-12 ENCOUNTER — Ambulatory Visit: Payer: Medicare Other | Admitting: Internal Medicine

## 2019-03-12 VITALS — BP 132/74 | HR 64 | Resp 18 | Wt 78.0 lb

## 2019-03-12 DIAGNOSIS — I5020 Unspecified systolic (congestive) heart failure: Secondary | ICD-10-CM | POA: Diagnosis not present

## 2019-03-12 DIAGNOSIS — I6523 Occlusion and stenosis of bilateral carotid arteries: Secondary | ICD-10-CM

## 2019-03-12 DIAGNOSIS — I110 Hypertensive heart disease with heart failure: Secondary | ICD-10-CM | POA: Diagnosis not present

## 2019-03-12 DIAGNOSIS — F22 Delusional disorders: Secondary | ICD-10-CM

## 2019-03-12 DIAGNOSIS — I480 Paroxysmal atrial fibrillation: Secondary | ICD-10-CM | POA: Diagnosis not present

## 2019-03-12 DIAGNOSIS — E440 Moderate protein-calorie malnutrition: Secondary | ICD-10-CM | POA: Diagnosis not present

## 2019-03-12 DIAGNOSIS — I252 Old myocardial infarction: Secondary | ICD-10-CM | POA: Diagnosis not present

## 2019-03-12 DIAGNOSIS — I5022 Chronic systolic (congestive) heart failure: Secondary | ICD-10-CM

## 2019-03-12 DIAGNOSIS — D500 Iron deficiency anemia secondary to blood loss (chronic): Secondary | ICD-10-CM | POA: Diagnosis not present

## 2019-03-12 DIAGNOSIS — G4733 Obstructive sleep apnea (adult) (pediatric): Secondary | ICD-10-CM | POA: Diagnosis not present

## 2019-03-12 NOTE — Progress Notes (Signed)
Subjective:    Patient ID: Christina [name], female    DOB: 04-18-1920, 85 y.o.   MRN: [id number]  HPI Home visit for follow up of end stage CHF/homebound status--and other chronic medical conditions Daughter [name] here as usual Gaffer from [employer] as well  Still sleeps in bed Spends all day on the couch Transfers with assist of 1---but has actually gotten to the bathroom on her own three times this week (fortunately no falls) Discussed trying a bedside commode Still has aides 3 times per week for bathing Daughter did hire someone ---started last week (former CNA from hospice)  No chest pain Breathing seems to be okay No edema No PND---sleeps on a couple of pillows Using the oxygen all night---generally hasn't needed it during the day  Delusions continue Still thinks her daughter is 2 different people Doesn't realize she is in her own home at times, etc Doesn't seem to get her agitated---except for rare situations  Has some right groin/hip pain May occur even just lying down Seems to radiate up towards chest Better if she rubs it She does get relief from tramadol if in pain. May be worse after spending all night in bed (gets stiff?)  No focal neurologic symptoms to suggest new CVA  Current Outpatient Medications on File Prior to Visit  Medication Sig Dispense Refill  . acetaminophen (TYLENOL 8 HOUR) 650 MG CR tablet Take 650-1,300 mg by mouth every 8 (eight) hours as needed for pain. Do not exceed 3000 mg / 24 hrs of acetaminophen. Consider all sources    . Morphine Sulfate (MORPHINE CONCENTRATE) 10 mg / 0.5 ml concentrated solution Take 0.25-0.5 mLs (5-10 mg total) by mouth every 3 (three) hours as needed for severe pain or shortness of breath. 30 mL 0  . nitroGLYCERIN (NITROSTAT) 0.4 MG SL tablet PLACE 1 TABLET (0.4 MG TOTAL) UNDER THE TONGUE EVERY 5 (FIVE) MINUTES AS NEEDED FOR CHEST PAIN. 25 tablet 0  . risperiDONE (RISPERDAL) 0.25 MG tablet TAKE 1 TABLET (0.25  MG TOTAL) BY MOUTH AT BEDTIME AS NEEDED. 90 tablet 3  . traMADol (ULTRAM) 50 MG tablet TAKE 0.5-1 TABLETS (25-50 MG TOTAL) BY MOUTH 3 (THREE) TIMES DAILY AS NEEDED. (Patient taking differently: Take 25-50 mg by mouth 3 (three) times daily as needed for moderate pain. ) 60 tablet 0   No current facility-administered medications on file prior to visit.     Allergies  Allergen Reactions  . Bee Venom Rash  . Plavix [Clopidogrel] Rash  . Atorvastatin Other (See Comments)    Reaction:  Blisters on feet   . Diltiazem Hcl Other (See Comments)    Reaction:  Unknown   . Dye Fdc Red [Red Dye] Other (See Comments)    Reaction:  Unknown   . Gemfibrozil Other (See Comments)    Reaction:  Unknown   . Niacin Other (See Comments)    Reaction:  Unknown   . Sulfasalazine Rash    Past Medical History:  Diagnosis Date  . CAD (coronary artery disease)    a. S/P previous MI;  b. 04/2007 Cath/PCI: Taxus DES' to mid/distal RCA and RPDA as well as Ramus;  c. myoview 8/09: EF 78%, normal perfusion; d. 12/2012 Low risk MV; e. 08/2017 NSTEMI->Med Rx.  . Carotid stenosis    Bilateral, mild to moderate  . Chronic back pain   . Chronic shoulder pain   . HFrEF (heart failure with reduced ejection fraction) (HCC)    a. 08/2017 Echo:  EF 20-25%, mid-apicalanteroseptal, ant, antlat, and apical sev HK. Mild MR, mildly dil LA, nl RV fxn.  . Hyperlipidemia    Intolerant of many statins  . Hypertension   . Ischemic cardiomyopathy    a. 08/2017 Echo: EF 20-25%, mid-apicalanteroseptal, ant, antlat, and apical sev HK.  . Mild mitral and aortic regurgitation    a. 11/2011 Echo: EF 55-65%, No RWMA, Gr 1 DD, Mild AI/MR; b. 08/2017 Echo: Mild MR.  Marland Kitchen Neuropathy   . Osteoarthritis   . Osteoporosis   . PAF (paroxysmal atrial fibrillation) (HCC)    a. 08/2017 - noted @ time of NSTEMI. CHA2DS2VASc = 5-->ASA only due to age and h/o falls;  c. On Amio.  Marland Kitchen Pelvic fracture (HCC) 6/16  . Peripheral vascular disease [facility])     Past  Surgical History:  Procedure Laterality Date  . 2 broken arms     . ABDOMINAL HYSTERECTOMY    . ADENOSINE MYOVIEW  01/2006   No ischemia, EF 70%  . BREAST BIOPSY     Bilateral  . CAROTID U/S  10/2006   0-39% bilaterally  . CATARACT EXTRACTION  09/2006   Right  . CORONARY ANGIOPLASTY  12/2002   Medical rx only  . CORONARY ANGIOPLASTY  01/2006   LAD/PAD disease  . CORONARY ANGIOPLASTY  03/2007   3 vessel disease  . CORONARY STENT PLACEMENT  2002   MI, [facility]  . CORONARY STENT PLACEMENT  04/2007   RCA/LCX multiple, [facility]  . CP ADMIT  09/2005   Stress myoview negative  . FEMORAL HERNIA REPAIR  05/2007   Incarcerated, right  . FRACTURE SURGERY  03/2009   Left leg  . hip fracture repair Bilateral   . INTRAMEDULLARY (IM) NAIL INTERTROCHANTERIC Right 05/18/2016   Procedure: INTRAMEDULLARY (IM) NAIL INTERTROCHANTRIC;  Surgeon: [name], MD;  Location: [facility] ORS;  Service: Orthopedics;  Laterality: Right;  . INTRAMEDULLARY (IM) NAIL INTERTROCHANTERIC Left 06/18/2017   Procedure: INTRAMEDULLARY (IM) NAIL INTERTROCHANTRIC;  Surgeon: Deeann Saint, MD;  Location: [facility] ORS;  Service: Orthopedics;  Laterality: Left;  . NM MYOVIEW LTD  11/2004   Stress, negative; NL EF  . two broken wrist on left arm,    . VESICOVAGINAL FISTULA CLOSURE W/ TAH    . VISCERAL ARTERY INTERVENTION N/A 04/22/2018   Procedure: VISCERAL ARTERY INTERVENTION;  Surgeon: Annice Needy, MD;  Location: [facility] INVASIVE CV LAB;  Service: Cardiovascular;  Laterality: N/A;    Family History  Problem Relation Age of Onset  . Heart disease Mother   . Pulmonary embolism Father   . Heart disease Sister   . Cancer Brother   . Cancer Sister     Social History   Socioeconomic History  . Marital status: Widowed    Spouse name: Not on file  . Number of children: 3  . Years of education: 23  . Highest education level: Not on file  Occupational History    Comment: retired from retail  Social Needs  . Financial resource  strain: Not on file  . Food insecurity:    Worry: Not on file    Inability: Not on file  . Transportation needs:    Medical: Not on file    Non-medical: Not on file  Tobacco Use  . Smoking status: Never Smoker  . Smokeless tobacco: Never Used  Substance and Sexual Activity  . Alcohol use: No  . Drug use: No  . Sexual activity: Not on file  Lifestyle  . Physical activity:  Days per week: Not on file    Minutes per session: Not on file  . Stress: Not on file  Relationships  . Social connections:    Talks on phone: Not on file    Gets together: Not on file    Attends religious service: Not on file    Active member of club or organization: Not on file    Attends meetings of clubs or organizations: Not on file    Relationship status: Not on file  . Intimate partner violence:    Fear of current or ex partner: Not on file    Emotionally abused: Not on file    Physically abused: Not on file    Forced sexual activity: Not on file  Other Topics Concern  . Not on file  Social History Narrative   Widowed 2005 after long time care for husband after stroke   Remarried but widowed again 11/18      No formal living will   Daughter [name] should be her health care POA   Has DNR already   No feeding tube if cognitively unaware   Review of Systems  Sleeping more--much of the day now Daughter has given some tylenol PM--helps initiation of sleep. Asked her to just use plain tylenol Appetite seems okay but continues to lose weight Bowels are slow. Goes about every other day---fairly normal for her     Objective:   Physical Exam  Constitutional: No distress.  Still appears somewhat wasted  Neck: No thyromegaly present.  Cardiovascular: Normal rate, regular rhythm and normal heart sounds. Exam reveals no gallop.  No murmur heard. occ skips  Respiratory: Effort normal and breath sounds normal. No respiratory distress. She has no wheezes. She has no rales.  GI: Soft. There is no  abdominal tenderness.  Musculoskeletal:        General: No tenderness or edema.  Lymphadenopathy:    She has no cervical adenopathy.  Psychiatric: She has a normal mood and affect. Her behavior is normal.           Assessment & Plan:

## 2019-03-12 NOTE — Assessment & Plan Note (Signed)
Compensated Continues to lose weight even though she is eating okay No apparent fluid overload No Rx

## 2019-03-12 NOTE — Assessment & Plan Note (Signed)
Still delusional and paranoid Not worse--and nothing prolonged on the risperidone

## 2019-03-12 NOTE — Assessment & Plan Note (Signed)
Nothing to suggest new neurologic event No Rx given her overall status

## 2019-03-12 NOTE — Assessment & Plan Note (Signed)
Eating okay but still losing weight May be element of cardiac cachexia

## 2019-03-12 NOTE — Assessment & Plan Note (Signed)
Regular now No treatment

## 2019-03-14 DIAGNOSIS — G4733 Obstructive sleep apnea (adult) (pediatric): Secondary | ICD-10-CM | POA: Diagnosis not present

## 2019-03-14 DIAGNOSIS — I110 Hypertensive heart disease with heart failure: Secondary | ICD-10-CM | POA: Diagnosis not present

## 2019-03-14 DIAGNOSIS — I5020 Unspecified systolic (congestive) heart failure: Secondary | ICD-10-CM | POA: Diagnosis not present

## 2019-03-14 DIAGNOSIS — I252 Old myocardial infarction: Secondary | ICD-10-CM | POA: Diagnosis not present

## 2019-03-14 DIAGNOSIS — D500 Iron deficiency anemia secondary to blood loss (chronic): Secondary | ICD-10-CM | POA: Diagnosis not present

## 2019-03-14 DIAGNOSIS — I480 Paroxysmal atrial fibrillation: Secondary | ICD-10-CM | POA: Diagnosis not present

## 2019-03-19 DIAGNOSIS — I252 Old myocardial infarction: Secondary | ICD-10-CM | POA: Diagnosis not present

## 2019-03-19 DIAGNOSIS — Z8719 Personal history of other diseases of the digestive system: Secondary | ICD-10-CM | POA: Diagnosis not present

## 2019-03-19 DIAGNOSIS — Z741 Need for assistance with personal care: Secondary | ICD-10-CM | POA: Diagnosis not present

## 2019-03-19 DIAGNOSIS — Z681 Body mass index (BMI) 19 or less, adult: Secondary | ICD-10-CM | POA: Diagnosis not present

## 2019-03-19 DIAGNOSIS — I25119 Atherosclerotic heart disease of native coronary artery with unspecified angina pectoris: Secondary | ICD-10-CM | POA: Diagnosis not present

## 2019-03-19 DIAGNOSIS — R634 Abnormal weight loss: Secondary | ICD-10-CM | POA: Diagnosis not present

## 2019-03-19 DIAGNOSIS — D500 Iron deficiency anemia secondary to blood loss (chronic): Secondary | ICD-10-CM | POA: Diagnosis not present

## 2019-03-19 DIAGNOSIS — R296 Repeated falls: Secondary | ICD-10-CM | POA: Diagnosis not present

## 2019-03-19 DIAGNOSIS — Z8781 Personal history of (healed) traumatic fracture: Secondary | ICD-10-CM | POA: Diagnosis not present

## 2019-03-19 DIAGNOSIS — E785 Hyperlipidemia, unspecified: Secondary | ICD-10-CM | POA: Diagnosis not present

## 2019-03-19 DIAGNOSIS — I110 Hypertensive heart disease with heart failure: Secondary | ICD-10-CM | POA: Diagnosis not present

## 2019-03-19 DIAGNOSIS — I5022 Chronic systolic (congestive) heart failure: Secondary | ICD-10-CM | POA: Diagnosis not present

## 2019-03-19 DIAGNOSIS — I480 Paroxysmal atrial fibrillation: Secondary | ICD-10-CM | POA: Diagnosis not present

## 2019-03-19 DIAGNOSIS — G4733 Obstructive sleep apnea (adult) (pediatric): Secondary | ICD-10-CM | POA: Diagnosis not present

## 2019-03-19 DIAGNOSIS — R627 Adult failure to thrive: Secondary | ICD-10-CM | POA: Diagnosis not present

## 2019-04-03 ENCOUNTER — Other Ambulatory Visit: Payer: Self-pay | Admitting: Internal Medicine

## 2019-04-14 DIAGNOSIS — I110 Hypertensive heart disease with heart failure: Secondary | ICD-10-CM | POA: Diagnosis not present

## 2019-04-14 DIAGNOSIS — I5022 Chronic systolic (congestive) heart failure: Secondary | ICD-10-CM | POA: Diagnosis not present

## 2019-04-14 DIAGNOSIS — I25119 Atherosclerotic heart disease of native coronary artery with unspecified angina pectoris: Secondary | ICD-10-CM | POA: Diagnosis not present

## 2019-04-14 DIAGNOSIS — E785 Hyperlipidemia, unspecified: Secondary | ICD-10-CM | POA: Diagnosis not present

## 2019-04-14 DIAGNOSIS — I252 Old myocardial infarction: Secondary | ICD-10-CM | POA: Diagnosis not present

## 2019-04-14 DIAGNOSIS — I480 Paroxysmal atrial fibrillation: Secondary | ICD-10-CM | POA: Diagnosis not present

## 2019-04-18 DIAGNOSIS — R296 Repeated falls: Secondary | ICD-10-CM | POA: Diagnosis not present

## 2019-04-18 DIAGNOSIS — Z8719 Personal history of other diseases of the digestive system: Secondary | ICD-10-CM | POA: Diagnosis not present

## 2019-04-18 DIAGNOSIS — R627 Adult failure to thrive: Secondary | ICD-10-CM | POA: Diagnosis not present

## 2019-04-18 DIAGNOSIS — G4733 Obstructive sleep apnea (adult) (pediatric): Secondary | ICD-10-CM | POA: Diagnosis not present

## 2019-04-18 DIAGNOSIS — I5022 Chronic systolic (congestive) heart failure: Secondary | ICD-10-CM | POA: Diagnosis not present

## 2019-04-18 DIAGNOSIS — I25119 Atherosclerotic heart disease of native coronary artery with unspecified angina pectoris: Secondary | ICD-10-CM | POA: Diagnosis not present

## 2019-04-18 DIAGNOSIS — D500 Iron deficiency anemia secondary to blood loss (chronic): Secondary | ICD-10-CM | POA: Diagnosis not present

## 2019-04-18 DIAGNOSIS — I110 Hypertensive heart disease with heart failure: Secondary | ICD-10-CM | POA: Diagnosis not present

## 2019-04-18 DIAGNOSIS — Z741 Need for assistance with personal care: Secondary | ICD-10-CM | POA: Diagnosis not present

## 2019-04-18 DIAGNOSIS — Z8781 Personal history of (healed) traumatic fracture: Secondary | ICD-10-CM | POA: Diagnosis not present

## 2019-04-18 DIAGNOSIS — I252 Old myocardial infarction: Secondary | ICD-10-CM | POA: Diagnosis not present

## 2019-04-18 DIAGNOSIS — I480 Paroxysmal atrial fibrillation: Secondary | ICD-10-CM | POA: Diagnosis not present

## 2019-04-18 DIAGNOSIS — R634 Abnormal weight loss: Secondary | ICD-10-CM | POA: Diagnosis not present

## 2019-04-18 DIAGNOSIS — E785 Hyperlipidemia, unspecified: Secondary | ICD-10-CM | POA: Diagnosis not present

## 2019-04-18 DIAGNOSIS — Z681 Body mass index (BMI) 19 or less, adult: Secondary | ICD-10-CM | POA: Diagnosis not present

## 2019-04-19 ENCOUNTER — Other Ambulatory Visit: Payer: Self-pay | Admitting: Internal Medicine

## 2019-04-21 DIAGNOSIS — E785 Hyperlipidemia, unspecified: Secondary | ICD-10-CM | POA: Diagnosis not present

## 2019-04-21 DIAGNOSIS — I252 Old myocardial infarction: Secondary | ICD-10-CM | POA: Diagnosis not present

## 2019-04-21 DIAGNOSIS — I5022 Chronic systolic (congestive) heart failure: Secondary | ICD-10-CM | POA: Diagnosis not present

## 2019-04-21 DIAGNOSIS — I25119 Atherosclerotic heart disease of native coronary artery with unspecified angina pectoris: Secondary | ICD-10-CM | POA: Diagnosis not present

## 2019-04-21 DIAGNOSIS — I110 Hypertensive heart disease with heart failure: Secondary | ICD-10-CM | POA: Diagnosis not present

## 2019-04-21 DIAGNOSIS — I480 Paroxysmal atrial fibrillation: Secondary | ICD-10-CM | POA: Diagnosis not present

## 2019-04-30 DIAGNOSIS — I5022 Chronic systolic (congestive) heart failure: Secondary | ICD-10-CM | POA: Diagnosis not present

## 2019-04-30 DIAGNOSIS — E785 Hyperlipidemia, unspecified: Secondary | ICD-10-CM | POA: Diagnosis not present

## 2019-04-30 DIAGNOSIS — I25119 Atherosclerotic heart disease of native coronary artery with unspecified angina pectoris: Secondary | ICD-10-CM | POA: Diagnosis not present

## 2019-04-30 DIAGNOSIS — I480 Paroxysmal atrial fibrillation: Secondary | ICD-10-CM | POA: Diagnosis not present

## 2019-04-30 DIAGNOSIS — I110 Hypertensive heart disease with heart failure: Secondary | ICD-10-CM | POA: Diagnosis not present

## 2019-04-30 DIAGNOSIS — I252 Old myocardial infarction: Secondary | ICD-10-CM | POA: Diagnosis not present

## 2019-05-05 DIAGNOSIS — I252 Old myocardial infarction: Secondary | ICD-10-CM | POA: Diagnosis not present

## 2019-05-05 DIAGNOSIS — E785 Hyperlipidemia, unspecified: Secondary | ICD-10-CM | POA: Diagnosis not present

## 2019-05-05 DIAGNOSIS — I25119 Atherosclerotic heart disease of native coronary artery with unspecified angina pectoris: Secondary | ICD-10-CM | POA: Diagnosis not present

## 2019-05-05 DIAGNOSIS — I110 Hypertensive heart disease with heart failure: Secondary | ICD-10-CM | POA: Diagnosis not present

## 2019-05-05 DIAGNOSIS — I480 Paroxysmal atrial fibrillation: Secondary | ICD-10-CM | POA: Diagnosis not present

## 2019-05-05 DIAGNOSIS — I5022 Chronic systolic (congestive) heart failure: Secondary | ICD-10-CM | POA: Diagnosis not present

## 2019-05-14 ENCOUNTER — Other Ambulatory Visit: Payer: Self-pay

## 2019-05-14 ENCOUNTER — Encounter: Payer: Self-pay | Admitting: Internal Medicine

## 2019-05-14 ENCOUNTER — Ambulatory Visit: Payer: Medicare Other | Admitting: Internal Medicine

## 2019-05-14 VITALS — BP 90/60 | HR 75 | Resp 16 | Wt 91.0 lb

## 2019-05-14 DIAGNOSIS — F0390 Unspecified dementia without behavioral disturbance: Secondary | ICD-10-CM

## 2019-05-14 DIAGNOSIS — I252 Old myocardial infarction: Secondary | ICD-10-CM | POA: Diagnosis not present

## 2019-05-14 DIAGNOSIS — E440 Moderate protein-calorie malnutrition: Secondary | ICD-10-CM

## 2019-05-14 DIAGNOSIS — I110 Hypertensive heart disease with heart failure: Secondary | ICD-10-CM | POA: Diagnosis not present

## 2019-05-14 DIAGNOSIS — I25119 Atherosclerotic heart disease of native coronary artery with unspecified angina pectoris: Secondary | ICD-10-CM

## 2019-05-14 DIAGNOSIS — I5022 Chronic systolic (congestive) heart failure: Secondary | ICD-10-CM

## 2019-05-14 DIAGNOSIS — E785 Hyperlipidemia, unspecified: Secondary | ICD-10-CM | POA: Diagnosis not present

## 2019-05-14 DIAGNOSIS — I6523 Occlusion and stenosis of bilateral carotid arteries: Secondary | ICD-10-CM

## 2019-05-14 DIAGNOSIS — I480 Paroxysmal atrial fibrillation: Secondary | ICD-10-CM | POA: Diagnosis not present

## 2019-05-14 DIAGNOSIS — F22 Delusional disorders: Secondary | ICD-10-CM

## 2019-05-14 NOTE — Assessment & Plan Note (Signed)
Last weights may have been wrong Still cachectic

## 2019-05-14 NOTE — Assessment & Plan Note (Signed)
Hasn't needed the nitro in a couple of weeks Had a bad spell with vomiting, etc--that could have been anginal Off most of her medication now

## 2019-05-14 NOTE — Assessment & Plan Note (Signed)
This persists with delusions Seems to be controlled on the low dose nightly risperidone

## 2019-05-14 NOTE — Assessment & Plan Note (Signed)
No action since on hospice, etc No new neurologic symptoms

## 2019-05-14 NOTE — Progress Notes (Signed)
Subjective:    Patient ID: Christina [name], female    DOB: Nov 19, 1920, 85 y.o.   MRN: [id number]  HPI Follow up home visit on this hospice patient with dementia, CHF, etc Daughter [name] here as usual Hospice RNs Maralyn Sago and April also here  Doing about the same---some issues with increased confusion Ongoing delusions--doesn't think her daughter is her daughter, etc  Spends all day on the couch still Sleeps in bed Stands with assist of 1 Will occasionally get up on her own ---but hasn't fallen Hospice aides have just restarted for bathing---hard for daughter to do this alone  Still asks to use the bathroom Generally continent Eats after set up  Will get occasional chest pain--hasn't told daughter in the last 2-3 weeks Hasn't used the nitro Daughter gave her MSO4 once for apparent pain Had spell with vomiting, not feeling right a few weeks back. Then recovered No edema  Current Outpatient Medications on File Prior to Visit  Medication Sig Dispense Refill   acetaminophen (TYLENOL 8 HOUR) 650 MG CR tablet Take 650-1,300 mg by mouth every 8 (eight) hours as needed for pain. Do not exceed 3000 mg / 24 hrs of acetaminophen. Consider all sources     Morphine Sulfate (MORPHINE CONCENTRATE) 10 mg / 0.5 ml concentrated solution Take 0.25-0.5 mLs (5-10 mg total) by mouth every 3 (three) hours as needed for severe pain or shortness of breath. 30 mL 0   nitroGLYCERIN (NITROSTAT) 0.4 MG SL tablet PLACE 1 TABLET (0.4 MG TOTAL) UNDER THE TONGUE EVERY 5 (FIVE) MINUTES AS NEEDED FOR CHEST PAIN. 25 tablet 0   risperiDONE (RISPERDAL) 0.25 MG tablet TAKE ONE TABLET BY MOUTH AT BEDTIME AS NEEDED 30 tablet 11   traMADol (ULTRAM) 50 MG tablet TAKE 0.5-1 TABLETS (25-50 MG TOTAL) BY MOUTH 3 (THREE) TIMES DAILY AS NEEDED. (Patient taking differently: Take 25-50 mg by mouth 3 (three) times daily as needed for moderate pain. ) 60 tablet 0   No current facility-administered medications on file prior to visit.      Allergies  Allergen Reactions   Bee Venom Rash   Plavix [Clopidogrel] Rash   Atorvastatin Other (See Comments)    Reaction:  Blisters on feet    Diltiazem Hcl Other (See Comments)    Reaction:  Unknown    Dye Fdc Red [Red Dye] Other (See Comments)    Reaction:  Unknown    Gemfibrozil Other (See Comments)    Reaction:  Unknown    Niacin Other (See Comments)    Reaction:  Unknown    Sulfasalazine Rash    Past Medical History:  Diagnosis Date   CAD (coronary artery disease)    a. S/P previous MI;  b. 04/2007 Cath/PCI: Taxus DES' to mid/distal RCA and RPDA as well as Ramus;  c. myoview 8/09: EF 78%, normal perfusion; d. 12/2012 Low risk MV; e. 08/2017 NSTEMI->Med Rx.   Carotid stenosis    Bilateral, mild to moderate   Chronic back pain    Chronic shoulder pain    HFrEF (heart failure with reduced ejection fraction) (HCC)    a. 08/2017 Echo: EF 20-25%, mid-apicalanteroseptal, ant, antlat, and apical sev HK. Mild MR, mildly dil LA, nl RV fxn.   Hyperlipidemia    Intolerant of many statins   Hypertension    Ischemic cardiomyopathy    a. 08/2017 Echo: EF 20-25%, mid-apicalanteroseptal, ant, antlat, and apical sev HK.   Mild mitral and aortic regurgitation    a. 11/2011 Echo: EF  55-65%, No RWMA, Gr 1 DD, Mild AI/MR; b. 08/2017 Echo: Mild MR.   Neuropathy    Osteoarthritis    Osteoporosis    PAF (paroxysmal atrial fibrillation) (HCC)    a. 08/2017 - noted @ time of NSTEMI. CHA2DS2VASc = 5-->ASA only due to age and h/o falls;  c. On Amio.   Pelvic fracture (HCC) 6/16   Peripheral vascular disease (HCC)     Past Surgical History:  Procedure Laterality Date   2 broken arms      ABDOMINAL HYSTERECTOMY     ADENOSINE MYOVIEW  01/2006   No ischemia, EF 70%   BREAST BIOPSY     Bilateral   CAROTID U/S  10/2006   0-39% bilaterally   CATARACT EXTRACTION  09/2006   Right   CORONARY ANGIOPLASTY  12/2002   Medical rx only   CORONARY ANGIOPLASTY  01/2006     LAD/PAD disease   CORONARY ANGIOPLASTY  03/2007   3 vessel disease   CORONARY STENT PLACEMENT  2002   MI, [facility]   CORONARY STENT PLACEMENT  04/2007   RCA/LCX multiple, [facility]   CP ADMIT  09/2005   Stress myoview negative   FEMORAL HERNIA REPAIR  05/2007   Incarcerated, right   FRACTURE SURGERY  03/2009   Left leg   hip fracture repair Bilateral    INTRAMEDULLARY (IM) NAIL INTERTROCHANTERIC Right 05/18/2016   Procedure: INTRAMEDULLARY (IM) NAIL INTERTROCHANTRIC;  Surgeon: [name], MD;  Location: [facility];  Service: Orthopedics;  Laterality: Right;   INTRAMEDULLARY (IM) NAIL INTERTROCHANTERIC Left 06/18/2017   Procedure: INTRAMEDULLARY (IM) NAIL INTERTROCHANTRIC;  Surgeon: Deeann Saint, MD;  Location: [facility];  Service: Orthopedics;  Laterality: Left;   NM MYOVIEW LTD  11/2004   Stress, negative; NL EF   two broken wrist on left arm,     VESICOVAGINAL FISTULA CLOSURE W/ TAH     VISCERAL ARTERY INTERVENTION N/A 04/22/2018   Procedure: VISCERAL ARTERY INTERVENTION;  Surgeon: Annice Needy, MD;  Location: [facility];  Service: Cardiovascular;  Laterality: N/A;    Family History  Problem Relation Age of Onset   Heart disease Mother    Pulmonary embolism Father    Heart disease Sister    Cancer Brother    Cancer Sister     Social History   Socioeconomic History   Marital status: Widowed    Spouse name: Not on file   Number of children: 3   Years of education: 12   Highest education level: Not on file  Occupational History    Comment: retired from Sports coach strain: Not on file   Food insecurity:    Worry: Not on file    Inability: Not on file   Transportation needs:    Medical: Not on file    Non-medical: Not on file  Tobacco Use   Smoking status: Never Smoker   Smokeless tobacco: Never Used  Substance and Sexual Activity   Alcohol use: No   Drug use: No   Sexual activity: Not on file   Lifestyle   Physical activity:    Days per week: Not on file    Minutes per session: Not on file   Stress: Not on file  Relationships   Social connections:    Talks on phone: Not on file    Gets together: Not on file    Attends religious service: Not on file    Active member of club or organization:  Not on file    Attends meetings of clubs or organizations: Not on file    Relationship status: Not on file   Intimate partner violence:    Fear of current or ex partner: Not on file    Emotionally abused: Not on file    Physically abused: Not on file    Forced sexual activity: Not on file  Other Topics Concern   Not on file  Social History Narrative   Widowed 2005 after long time care for husband after stroke   Remarried but widowed again 11/18      No formal living will   Daughter Carney BernJean should be her health care POA   Has DNR already   No feeding tube if cognitively unaware    Review of Systems Eats well Weight is up---may have been improper weights the last couple of times Bowels move every 2-3 days Early red spot on right heel Hasn't really needed the tramadol Vision and hearing are both fading some    Objective:   Physical Exam  Constitutional: No distress.  Somewhat cachectic Pale and appears chronically ill  Neck: No thyromegaly present.  Cardiovascular: Normal rate, regular rhythm and normal heart sounds. Exam reveals no gallop.  No murmur heard. Respiratory: Effort normal. No respiratory distress. She has no wheezes. She has no rales.  Decreased breath sounds but clear  GI: Soft. There is no abdominal tenderness.  Musculoskeletal:        General: No edema.  Lymphadenopathy:    She has no cervical adenopathy.  Skin:  No true heel ulcer---has slight whitish area that may have been from friction--but not red or open (on right lateral)           Assessment & Plan:

## 2019-05-14 NOTE — Assessment & Plan Note (Signed)
More confusion and cognitive decline Delusions, etc Daughetr still relying on support from hospice to keep her home

## 2019-05-14 NOTE — Assessment & Plan Note (Signed)
Appears to be compensated Weight back up again (error last times) but no obvious fluid

## 2019-05-15 ENCOUNTER — Telehealth: Payer: Self-pay

## 2019-05-15 NOTE — Telephone Encounter (Signed)
Call attempted to schedule overdue F/U with Dr. Kirke Corin. Left voicemail message for patient or daughter [name] to call back to discuss.

## 2019-05-16 DIAGNOSIS — E785 Hyperlipidemia, unspecified: Secondary | ICD-10-CM | POA: Diagnosis not present

## 2019-05-16 DIAGNOSIS — I25119 Atherosclerotic heart disease of native coronary artery with unspecified angina pectoris: Secondary | ICD-10-CM | POA: Diagnosis not present

## 2019-05-16 DIAGNOSIS — I5022 Chronic systolic (congestive) heart failure: Secondary | ICD-10-CM | POA: Diagnosis not present

## 2019-05-16 DIAGNOSIS — I110 Hypertensive heart disease with heart failure: Secondary | ICD-10-CM | POA: Diagnosis not present

## 2019-05-16 DIAGNOSIS — I252 Old myocardial infarction: Secondary | ICD-10-CM | POA: Diagnosis not present

## 2019-05-16 DIAGNOSIS — I480 Paroxysmal atrial fibrillation: Secondary | ICD-10-CM | POA: Diagnosis not present

## 2019-05-19 ENCOUNTER — Telehealth: Payer: Self-pay

## 2019-05-19 DIAGNOSIS — I25119 Atherosclerotic heart disease of native coronary artery with unspecified angina pectoris: Secondary | ICD-10-CM | POA: Diagnosis not present

## 2019-05-19 DIAGNOSIS — R634 Abnormal weight loss: Secondary | ICD-10-CM | POA: Diagnosis not present

## 2019-05-19 DIAGNOSIS — D500 Iron deficiency anemia secondary to blood loss (chronic): Secondary | ICD-10-CM | POA: Diagnosis not present

## 2019-05-19 DIAGNOSIS — E785 Hyperlipidemia, unspecified: Secondary | ICD-10-CM | POA: Diagnosis not present

## 2019-05-19 DIAGNOSIS — Z8719 Personal history of other diseases of the digestive system: Secondary | ICD-10-CM | POA: Diagnosis not present

## 2019-05-19 DIAGNOSIS — I110 Hypertensive heart disease with heart failure: Secondary | ICD-10-CM | POA: Diagnosis not present

## 2019-05-19 DIAGNOSIS — Z681 Body mass index (BMI) 19 or less, adult: Secondary | ICD-10-CM | POA: Diagnosis not present

## 2019-05-19 DIAGNOSIS — Z8781 Personal history of (healed) traumatic fracture: Secondary | ICD-10-CM | POA: Diagnosis not present

## 2019-05-19 DIAGNOSIS — Z741 Need for assistance with personal care: Secondary | ICD-10-CM | POA: Diagnosis not present

## 2019-05-19 DIAGNOSIS — I480 Paroxysmal atrial fibrillation: Secondary | ICD-10-CM | POA: Diagnosis not present

## 2019-05-19 DIAGNOSIS — G4733 Obstructive sleep apnea (adult) (pediatric): Secondary | ICD-10-CM | POA: Diagnosis not present

## 2019-05-19 DIAGNOSIS — I252 Old myocardial infarction: Secondary | ICD-10-CM | POA: Diagnosis not present

## 2019-05-19 DIAGNOSIS — R627 Adult failure to thrive: Secondary | ICD-10-CM | POA: Diagnosis not present

## 2019-05-19 DIAGNOSIS — I5022 Chronic systolic (congestive) heart failure: Secondary | ICD-10-CM | POA: Diagnosis not present

## 2019-05-19 DIAGNOSIS — R296 Repeated falls: Secondary | ICD-10-CM | POA: Diagnosis not present

## 2019-05-19 NOTE — Telephone Encounter (Signed)
Encounter opened in error

## 2019-05-21 DIAGNOSIS — I110 Hypertensive heart disease with heart failure: Secondary | ICD-10-CM | POA: Diagnosis not present

## 2019-05-21 DIAGNOSIS — I5022 Chronic systolic (congestive) heart failure: Secondary | ICD-10-CM | POA: Diagnosis not present

## 2019-05-21 DIAGNOSIS — I25119 Atherosclerotic heart disease of native coronary artery with unspecified angina pectoris: Secondary | ICD-10-CM | POA: Diagnosis not present

## 2019-05-21 DIAGNOSIS — E785 Hyperlipidemia, unspecified: Secondary | ICD-10-CM | POA: Diagnosis not present

## 2019-05-21 DIAGNOSIS — I252 Old myocardial infarction: Secondary | ICD-10-CM | POA: Diagnosis not present

## 2019-05-21 DIAGNOSIS — I480 Paroxysmal atrial fibrillation: Secondary | ICD-10-CM | POA: Diagnosis not present

## 2019-05-23 DIAGNOSIS — I252 Old myocardial infarction: Secondary | ICD-10-CM | POA: Diagnosis not present

## 2019-05-23 DIAGNOSIS — I480 Paroxysmal atrial fibrillation: Secondary | ICD-10-CM | POA: Diagnosis not present

## 2019-05-23 DIAGNOSIS — I25119 Atherosclerotic heart disease of native coronary artery with unspecified angina pectoris: Secondary | ICD-10-CM | POA: Diagnosis not present

## 2019-05-23 DIAGNOSIS — I110 Hypertensive heart disease with heart failure: Secondary | ICD-10-CM | POA: Diagnosis not present

## 2019-05-23 DIAGNOSIS — E785 Hyperlipidemia, unspecified: Secondary | ICD-10-CM | POA: Diagnosis not present

## 2019-05-23 DIAGNOSIS — I5022 Chronic systolic (congestive) heart failure: Secondary | ICD-10-CM | POA: Diagnosis not present

## 2019-05-28 DIAGNOSIS — I252 Old myocardial infarction: Secondary | ICD-10-CM | POA: Diagnosis not present

## 2019-05-28 DIAGNOSIS — I480 Paroxysmal atrial fibrillation: Secondary | ICD-10-CM | POA: Diagnosis not present

## 2019-05-28 DIAGNOSIS — I25119 Atherosclerotic heart disease of native coronary artery with unspecified angina pectoris: Secondary | ICD-10-CM | POA: Diagnosis not present

## 2019-05-28 DIAGNOSIS — I110 Hypertensive heart disease with heart failure: Secondary | ICD-10-CM | POA: Diagnosis not present

## 2019-05-28 DIAGNOSIS — E785 Hyperlipidemia, unspecified: Secondary | ICD-10-CM | POA: Diagnosis not present

## 2019-05-28 DIAGNOSIS — I5022 Chronic systolic (congestive) heart failure: Secondary | ICD-10-CM | POA: Diagnosis not present

## 2019-05-29 DIAGNOSIS — I110 Hypertensive heart disease with heart failure: Secondary | ICD-10-CM | POA: Diagnosis not present

## 2019-05-29 DIAGNOSIS — I5022 Chronic systolic (congestive) heart failure: Secondary | ICD-10-CM | POA: Diagnosis not present

## 2019-05-29 DIAGNOSIS — I480 Paroxysmal atrial fibrillation: Secondary | ICD-10-CM | POA: Diagnosis not present

## 2019-05-29 DIAGNOSIS — I25119 Atherosclerotic heart disease of native coronary artery with unspecified angina pectoris: Secondary | ICD-10-CM | POA: Diagnosis not present

## 2019-05-29 DIAGNOSIS — E785 Hyperlipidemia, unspecified: Secondary | ICD-10-CM | POA: Diagnosis not present

## 2019-05-29 DIAGNOSIS — I252 Old myocardial infarction: Secondary | ICD-10-CM | POA: Diagnosis not present

## 2019-06-03 DIAGNOSIS — E785 Hyperlipidemia, unspecified: Secondary | ICD-10-CM | POA: Diagnosis not present

## 2019-06-03 DIAGNOSIS — I480 Paroxysmal atrial fibrillation: Secondary | ICD-10-CM | POA: Diagnosis not present

## 2019-06-03 DIAGNOSIS — I110 Hypertensive heart disease with heart failure: Secondary | ICD-10-CM | POA: Diagnosis not present

## 2019-06-03 DIAGNOSIS — I252 Old myocardial infarction: Secondary | ICD-10-CM | POA: Diagnosis not present

## 2019-06-03 DIAGNOSIS — I25119 Atherosclerotic heart disease of native coronary artery with unspecified angina pectoris: Secondary | ICD-10-CM | POA: Diagnosis not present

## 2019-06-03 DIAGNOSIS — I5022 Chronic systolic (congestive) heart failure: Secondary | ICD-10-CM | POA: Diagnosis not present

## 2019-06-04 DIAGNOSIS — I25119 Atherosclerotic heart disease of native coronary artery with unspecified angina pectoris: Secondary | ICD-10-CM | POA: Diagnosis not present

## 2019-06-04 DIAGNOSIS — I5022 Chronic systolic (congestive) heart failure: Secondary | ICD-10-CM | POA: Diagnosis not present

## 2019-06-04 DIAGNOSIS — E785 Hyperlipidemia, unspecified: Secondary | ICD-10-CM | POA: Diagnosis not present

## 2019-06-04 DIAGNOSIS — I480 Paroxysmal atrial fibrillation: Secondary | ICD-10-CM | POA: Diagnosis not present

## 2019-06-04 DIAGNOSIS — I110 Hypertensive heart disease with heart failure: Secondary | ICD-10-CM | POA: Diagnosis not present

## 2019-06-04 DIAGNOSIS — I252 Old myocardial infarction: Secondary | ICD-10-CM | POA: Diagnosis not present

## 2019-06-09 DIAGNOSIS — I110 Hypertensive heart disease with heart failure: Secondary | ICD-10-CM | POA: Diagnosis not present

## 2019-06-09 DIAGNOSIS — E785 Hyperlipidemia, unspecified: Secondary | ICD-10-CM | POA: Diagnosis not present

## 2019-06-09 DIAGNOSIS — I25119 Atherosclerotic heart disease of native coronary artery with unspecified angina pectoris: Secondary | ICD-10-CM | POA: Diagnosis not present

## 2019-06-09 DIAGNOSIS — I252 Old myocardial infarction: Secondary | ICD-10-CM | POA: Diagnosis not present

## 2019-06-09 DIAGNOSIS — I5022 Chronic systolic (congestive) heart failure: Secondary | ICD-10-CM | POA: Diagnosis not present

## 2019-06-09 DIAGNOSIS — I480 Paroxysmal atrial fibrillation: Secondary | ICD-10-CM | POA: Diagnosis not present

## 2019-06-12 DIAGNOSIS — I5022 Chronic systolic (congestive) heart failure: Secondary | ICD-10-CM | POA: Diagnosis not present

## 2019-06-12 DIAGNOSIS — I480 Paroxysmal atrial fibrillation: Secondary | ICD-10-CM | POA: Diagnosis not present

## 2019-06-12 DIAGNOSIS — I25119 Atherosclerotic heart disease of native coronary artery with unspecified angina pectoris: Secondary | ICD-10-CM | POA: Diagnosis not present

## 2019-06-12 DIAGNOSIS — I252 Old myocardial infarction: Secondary | ICD-10-CM | POA: Diagnosis not present

## 2019-06-12 DIAGNOSIS — I110 Hypertensive heart disease with heart failure: Secondary | ICD-10-CM | POA: Diagnosis not present

## 2019-06-12 DIAGNOSIS — E785 Hyperlipidemia, unspecified: Secondary | ICD-10-CM | POA: Diagnosis not present

## 2019-06-17 DIAGNOSIS — I110 Hypertensive heart disease with heart failure: Secondary | ICD-10-CM | POA: Diagnosis not present

## 2019-06-17 DIAGNOSIS — I5022 Chronic systolic (congestive) heart failure: Secondary | ICD-10-CM | POA: Diagnosis not present

## 2019-06-17 DIAGNOSIS — I480 Paroxysmal atrial fibrillation: Secondary | ICD-10-CM | POA: Diagnosis not present

## 2019-06-17 DIAGNOSIS — I25119 Atherosclerotic heart disease of native coronary artery with unspecified angina pectoris: Secondary | ICD-10-CM | POA: Diagnosis not present

## 2019-06-17 DIAGNOSIS — E785 Hyperlipidemia, unspecified: Secondary | ICD-10-CM | POA: Diagnosis not present

## 2019-06-17 DIAGNOSIS — I252 Old myocardial infarction: Secondary | ICD-10-CM | POA: Diagnosis not present

## 2019-06-18 DIAGNOSIS — I252 Old myocardial infarction: Secondary | ICD-10-CM | POA: Diagnosis not present

## 2019-06-18 DIAGNOSIS — R296 Repeated falls: Secondary | ICD-10-CM | POA: Diagnosis not present

## 2019-06-18 DIAGNOSIS — I480 Paroxysmal atrial fibrillation: Secondary | ICD-10-CM | POA: Diagnosis not present

## 2019-06-18 DIAGNOSIS — G4733 Obstructive sleep apnea (adult) (pediatric): Secondary | ICD-10-CM | POA: Diagnosis not present

## 2019-06-18 DIAGNOSIS — Z681 Body mass index (BMI) 19 or less, adult: Secondary | ICD-10-CM | POA: Diagnosis not present

## 2019-06-18 DIAGNOSIS — I110 Hypertensive heart disease with heart failure: Secondary | ICD-10-CM | POA: Diagnosis not present

## 2019-06-18 DIAGNOSIS — R627 Adult failure to thrive: Secondary | ICD-10-CM | POA: Diagnosis not present

## 2019-06-18 DIAGNOSIS — Z8719 Personal history of other diseases of the digestive system: Secondary | ICD-10-CM | POA: Diagnosis not present

## 2019-06-18 DIAGNOSIS — I5022 Chronic systolic (congestive) heart failure: Secondary | ICD-10-CM | POA: Diagnosis not present

## 2019-06-18 DIAGNOSIS — R634 Abnormal weight loss: Secondary | ICD-10-CM | POA: Diagnosis not present

## 2019-06-18 DIAGNOSIS — D500 Iron deficiency anemia secondary to blood loss (chronic): Secondary | ICD-10-CM | POA: Diagnosis not present

## 2019-06-18 DIAGNOSIS — Z8781 Personal history of (healed) traumatic fracture: Secondary | ICD-10-CM | POA: Diagnosis not present

## 2019-06-18 DIAGNOSIS — E785 Hyperlipidemia, unspecified: Secondary | ICD-10-CM | POA: Diagnosis not present

## 2019-06-18 DIAGNOSIS — Z741 Need for assistance with personal care: Secondary | ICD-10-CM | POA: Diagnosis not present

## 2019-06-18 DIAGNOSIS — I25119 Atherosclerotic heart disease of native coronary artery with unspecified angina pectoris: Secondary | ICD-10-CM | POA: Diagnosis not present

## 2019-06-19 DIAGNOSIS — I110 Hypertensive heart disease with heart failure: Secondary | ICD-10-CM | POA: Diagnosis not present

## 2019-06-19 DIAGNOSIS — I252 Old myocardial infarction: Secondary | ICD-10-CM | POA: Diagnosis not present

## 2019-06-19 DIAGNOSIS — I25119 Atherosclerotic heart disease of native coronary artery with unspecified angina pectoris: Secondary | ICD-10-CM | POA: Diagnosis not present

## 2019-06-19 DIAGNOSIS — E785 Hyperlipidemia, unspecified: Secondary | ICD-10-CM | POA: Diagnosis not present

## 2019-06-19 DIAGNOSIS — I480 Paroxysmal atrial fibrillation: Secondary | ICD-10-CM | POA: Diagnosis not present

## 2019-06-19 DIAGNOSIS — I5022 Chronic systolic (congestive) heart failure: Secondary | ICD-10-CM | POA: Diagnosis not present

## 2019-06-24 DIAGNOSIS — I480 Paroxysmal atrial fibrillation: Secondary | ICD-10-CM | POA: Diagnosis not present

## 2019-06-24 DIAGNOSIS — I252 Old myocardial infarction: Secondary | ICD-10-CM | POA: Diagnosis not present

## 2019-06-24 DIAGNOSIS — E785 Hyperlipidemia, unspecified: Secondary | ICD-10-CM | POA: Diagnosis not present

## 2019-06-24 DIAGNOSIS — I110 Hypertensive heart disease with heart failure: Secondary | ICD-10-CM | POA: Diagnosis not present

## 2019-06-24 DIAGNOSIS — I25119 Atherosclerotic heart disease of native coronary artery with unspecified angina pectoris: Secondary | ICD-10-CM | POA: Diagnosis not present

## 2019-06-24 DIAGNOSIS — I5022 Chronic systolic (congestive) heart failure: Secondary | ICD-10-CM | POA: Diagnosis not present

## 2019-06-27 DIAGNOSIS — I252 Old myocardial infarction: Secondary | ICD-10-CM | POA: Diagnosis not present

## 2019-06-27 DIAGNOSIS — E785 Hyperlipidemia, unspecified: Secondary | ICD-10-CM | POA: Diagnosis not present

## 2019-06-27 DIAGNOSIS — I480 Paroxysmal atrial fibrillation: Secondary | ICD-10-CM | POA: Diagnosis not present

## 2019-06-27 DIAGNOSIS — I5022 Chronic systolic (congestive) heart failure: Secondary | ICD-10-CM | POA: Diagnosis not present

## 2019-06-27 DIAGNOSIS — I25119 Atherosclerotic heart disease of native coronary artery with unspecified angina pectoris: Secondary | ICD-10-CM | POA: Diagnosis not present

## 2019-06-27 DIAGNOSIS — I110 Hypertensive heart disease with heart failure: Secondary | ICD-10-CM | POA: Diagnosis not present

## 2019-07-01 DIAGNOSIS — I252 Old myocardial infarction: Secondary | ICD-10-CM | POA: Diagnosis not present

## 2019-07-01 DIAGNOSIS — E785 Hyperlipidemia, unspecified: Secondary | ICD-10-CM | POA: Diagnosis not present

## 2019-07-01 DIAGNOSIS — I25119 Atherosclerotic heart disease of native coronary artery with unspecified angina pectoris: Secondary | ICD-10-CM | POA: Diagnosis not present

## 2019-07-01 DIAGNOSIS — I110 Hypertensive heart disease with heart failure: Secondary | ICD-10-CM | POA: Diagnosis not present

## 2019-07-01 DIAGNOSIS — I480 Paroxysmal atrial fibrillation: Secondary | ICD-10-CM | POA: Diagnosis not present

## 2019-07-01 DIAGNOSIS — I5022 Chronic systolic (congestive) heart failure: Secondary | ICD-10-CM | POA: Diagnosis not present

## 2019-07-03 DIAGNOSIS — E785 Hyperlipidemia, unspecified: Secondary | ICD-10-CM | POA: Diagnosis not present

## 2019-07-03 DIAGNOSIS — I5022 Chronic systolic (congestive) heart failure: Secondary | ICD-10-CM | POA: Diagnosis not present

## 2019-07-03 DIAGNOSIS — I25119 Atherosclerotic heart disease of native coronary artery with unspecified angina pectoris: Secondary | ICD-10-CM | POA: Diagnosis not present

## 2019-07-03 DIAGNOSIS — I110 Hypertensive heart disease with heart failure: Secondary | ICD-10-CM | POA: Diagnosis not present

## 2019-07-03 DIAGNOSIS — I252 Old myocardial infarction: Secondary | ICD-10-CM | POA: Diagnosis not present

## 2019-07-03 DIAGNOSIS — I480 Paroxysmal atrial fibrillation: Secondary | ICD-10-CM | POA: Diagnosis not present

## 2019-07-04 DIAGNOSIS — I480 Paroxysmal atrial fibrillation: Secondary | ICD-10-CM | POA: Diagnosis not present

## 2019-07-04 DIAGNOSIS — I25119 Atherosclerotic heart disease of native coronary artery with unspecified angina pectoris: Secondary | ICD-10-CM | POA: Diagnosis not present

## 2019-07-04 DIAGNOSIS — E785 Hyperlipidemia, unspecified: Secondary | ICD-10-CM | POA: Diagnosis not present

## 2019-07-04 DIAGNOSIS — I110 Hypertensive heart disease with heart failure: Secondary | ICD-10-CM | POA: Diagnosis not present

## 2019-07-04 DIAGNOSIS — I5022 Chronic systolic (congestive) heart failure: Secondary | ICD-10-CM | POA: Diagnosis not present

## 2019-07-04 DIAGNOSIS — I252 Old myocardial infarction: Secondary | ICD-10-CM | POA: Diagnosis not present

## 2019-07-08 DIAGNOSIS — E785 Hyperlipidemia, unspecified: Secondary | ICD-10-CM | POA: Diagnosis not present

## 2019-07-08 DIAGNOSIS — I252 Old myocardial infarction: Secondary | ICD-10-CM | POA: Diagnosis not present

## 2019-07-08 DIAGNOSIS — I480 Paroxysmal atrial fibrillation: Secondary | ICD-10-CM | POA: Diagnosis not present

## 2019-07-08 DIAGNOSIS — I110 Hypertensive heart disease with heart failure: Secondary | ICD-10-CM | POA: Diagnosis not present

## 2019-07-08 DIAGNOSIS — I5022 Chronic systolic (congestive) heart failure: Secondary | ICD-10-CM | POA: Diagnosis not present

## 2019-07-08 DIAGNOSIS — I25119 Atherosclerotic heart disease of native coronary artery with unspecified angina pectoris: Secondary | ICD-10-CM | POA: Diagnosis not present

## 2019-07-09 ENCOUNTER — Encounter: Payer: Self-pay | Admitting: Internal Medicine

## 2019-07-09 ENCOUNTER — Ambulatory Visit: Payer: Medicare Other | Admitting: Internal Medicine

## 2019-07-09 VITALS — BP 122/78 | HR 78 | Resp 18 | Wt 87.0 lb

## 2019-07-09 DIAGNOSIS — I25119 Atherosclerotic heart disease of native coronary artery with unspecified angina pectoris: Secondary | ICD-10-CM | POA: Diagnosis not present

## 2019-07-09 DIAGNOSIS — F22 Delusional disorders: Secondary | ICD-10-CM | POA: Diagnosis not present

## 2019-07-09 DIAGNOSIS — E440 Moderate protein-calorie malnutrition: Secondary | ICD-10-CM

## 2019-07-09 DIAGNOSIS — I480 Paroxysmal atrial fibrillation: Secondary | ICD-10-CM

## 2019-07-09 DIAGNOSIS — F0151 Vascular dementia with behavioral disturbance: Secondary | ICD-10-CM | POA: Diagnosis not present

## 2019-07-09 DIAGNOSIS — I110 Hypertensive heart disease with heart failure: Secondary | ICD-10-CM | POA: Diagnosis not present

## 2019-07-09 DIAGNOSIS — I5022 Chronic systolic (congestive) heart failure: Secondary | ICD-10-CM | POA: Diagnosis not present

## 2019-07-09 DIAGNOSIS — E785 Hyperlipidemia, unspecified: Secondary | ICD-10-CM | POA: Diagnosis not present

## 2019-07-09 DIAGNOSIS — I252 Old myocardial infarction: Secondary | ICD-10-CM | POA: Diagnosis not present

## 2019-07-09 DIAGNOSIS — F01518 Vascular dementia, unspecified severity, with other behavioral disturbance: Secondary | ICD-10-CM

## 2019-07-09 NOTE — Assessment & Plan Note (Signed)
Ongoing confusion and agitation Makes it harder for daughter Trying to arrange some respite care for her

## 2019-07-09 NOTE — Assessment & Plan Note (Signed)
Seems to be compensated No Rx at this point Marked restriction--chair/cough and bed bound. More difficult to stand--needs more assist

## 2019-07-09 NOTE — Progress Notes (Signed)
Subjective:    Patient ID: Christina [name], female    DOB: 1920/05/16, 85 y.o.   MRN: [id number]  HPI Home visit for follow up of severe CHF and other medical conditions Still bed/chair bound Daughter Romie Minus is here as usual ----and hospice nurse Sarah  Was agitated last night--up from 2AM Now sleeping She was "going to get out of here" "Help me, someone help me" Doesn't realize she is in her own home --and wants to be brought home Ongoing delusions--doesn't know who daughter is, etc Usually sleeps through the night though  Hospice aides twice a week to help with bathing Hospice did just put daughter on the respite list--for respite time at hospice home Hasn't been able to hire extra aides due to their other potential exposures to COVID  No recent nitro use No clear chest pain Does have orthostatic dizziness Stands with significant assistance for transfers Did have one episode of incontinence--usually makes it Very little stamina---but no clear SOB at rest No edema Daughter has given her the morphine from time to time--but didn't try it last night since she doesn't think she would have taken it  Seems to be depressed, "but that is her nature" per  Current Outpatient Medications on File Prior to Visit  Medication Sig Dispense Refill  . acetaminophen (TYLENOL 8 HOUR) 650 MG CR tablet Take 650-1,300 mg by mouth every 8 (eight) hours as needed for pain. Do not exceed 3000 mg / 24 hrs of acetaminophen. Consider all sources    . Morphine Sulfate (MORPHINE CONCENTRATE) 10 mg / 0.5 ml concentrated solution Take 0.25-0.5 mLs (5-10 mg total) by mouth every 3 (three) hours as needed for severe pain or shortness of breath. 30 mL 0  . nitroGLYCERIN (NITROSTAT) 0.4 MG SL tablet PLACE 1 TABLET (0.4 MG TOTAL) UNDER THE TONGUE EVERY 5 (FIVE) MINUTES AS NEEDED FOR CHEST PAIN. 25 tablet 0  . risperiDONE (RISPERDAL) 0.25 MG tablet TAKE ONE TABLET BY MOUTH AT BEDTIME AS NEEDED (Patient taking  differently: Take 0.25 mg by mouth at bedtime. ) 30 tablet 11  . traMADol (ULTRAM) 50 MG tablet TAKE 0.5-1 TABLETS (25-50 MG TOTAL) BY MOUTH 3 (THREE) TIMES DAILY AS NEEDED. (Patient taking differently: Take 25-50 mg by mouth 3 (three) times daily as needed for moderate pain. ) 60 tablet 0   No current facility-administered medications on file prior to visit.     Allergies  Allergen Reactions  . Bee Venom Rash  . Plavix [Clopidogrel] Rash  . Atorvastatin Other (See Comments)    Reaction:  Blisters on feet   . Diltiazem Hcl Other (See Comments)    Reaction:  Unknown   . Dye Fdc Red [Red Dye] Other (See Comments)    Reaction:  Unknown   . Gemfibrozil Other (See Comments)    Reaction:  Unknown   . Niacin Other (See Comments)    Reaction:  Unknown   . Sulfasalazine Rash    Past Medical History:  Diagnosis Date  . CAD (coronary artery disease)    a. S/P previous MI;  b. 04/2007 Cath/PCI: Taxus DES' to mid/distal RCA and RPDA as well as Ramus;  c. myoview 8/09: EF 78%, normal perfusion; d. 12/2012 Low risk MV; e. 08/2017 NSTEMI->Med Rx.  . Carotid stenosis    Bilateral, mild to moderate  . Chronic back pain   . Chronic shoulder pain   . HFrEF (heart failure with reduced ejection fraction) ([facility])    a. 08/2017 Echo: EF 20-25%,  mid-apicalanteroseptal, ant, antlat, and apical sev HK. Mild MR, mildly dil LA, nl RV fxn.  . Hyperlipidemia    Intolerant of many statins  . Hypertension   . Ischemic cardiomyopathy    a. 08/2017 Echo: EF 20-25%, mid-apicalanteroseptal, ant, antlat, and apical sev HK.  . Mild mitral and aortic regurgitation    a. 11/2011 Echo: EF 55-65%, No RWMA, Gr 1 DD, Mild AI/MR; b. 08/2017 Echo: Mild MR.  Marland Kitchen. Neuropathy   . Osteoarthritis   . Osteoporosis   . PAF (paroxysmal atrial fibrillation) (HCC)    a. 08/2017 - noted @ time of NSTEMI. CHA2DS2VASc = 5-->ASA only due to age and h/o falls;  c. On Amio.  Marland Kitchen. Pelvic fracture (HCC) 6/16  . Peripheral vascular disease [facility](HCC)      Past Surgical History:  Procedure Laterality Date  . 2 broken arms     . ABDOMINAL HYSTERECTOMY    . ADENOSINE MYOVIEW  01/2006   No ischemia, EF 70%  . BREAST BIOPSY     Bilateral  . CAROTID U/S  10/2006   0-39% bilaterally  . CATARACT EXTRACTION  09/2006   Right  . CORONARY ANGIOPLASTY  12/2002   Medical rx only  . CORONARY ANGIOPLASTY  01/2006   LAD/PAD disease  . CORONARY ANGIOPLASTY  03/2007   3 vessel disease  . CORONARY STENT PLACEMENT  2002   MI, [facility]  . CORONARY STENT PLACEMENT  04/2007   RCA/LCX multiple, [facility]  . CP ADMIT  09/2005   Stress myoview negative  . FEMORAL HERNIA REPAIR  05/2007   Incarcerated, right  . FRACTURE SURGERY  03/2009   Left leg  . hip fracture repair Bilateral   . INTRAMEDULLARY (IM) NAIL INTERTROCHANTERIC Right 05/18/2016   Procedure: INTRAMEDULLARY (IM) NAIL INTERTROCHANTRIC;  Surgeon: Juanell FairlyKevin Krasinski, MD;  Location: [facility];  Service: Orthopedics;  Laterality: Right;  . INTRAMEDULLARY (IM) NAIL INTERTROCHANTERIC Left 06/18/2017   Procedure: INTRAMEDULLARY (IM) NAIL INTERTROCHANTRIC;  Surgeon: Deeann SaintMiller, Howard, MD;  Location: [facility];  Service: Orthopedics;  Laterality: Left;  . NM MYOVIEW LTD  11/2004   Stress, negative; NL EF  . two broken wrist on left arm,    . VESICOVAGINAL FISTULA CLOSURE W/ TAH    . VISCERAL ARTERY INTERVENTION N/A 04/22/2018   Procedure: VISCERAL ARTERY INTERVENTION;  Surgeon: Annice Needyew, Jason S, MD;  Location: [facility];  Service: Cardiovascular;  Laterality: N/A;    Family History  Problem Relation Age of Onset  . Heart disease Mother   . Pulmonary embolism Father   . Heart disease Sister   . Cancer Brother   . Cancer Sister     Social History   Socioeconomic History  . Marital status: Widowed    Spouse name: Not on file  . Number of children: 3  . Years of education: 2412  . Highest education level: Not on file  Occupational History    Comment: retired from retail  Social Needs  . Financial  resource strain: Not on file  . Food insecurity    Worry: Not on file    Inability: Not on file  . Transportation needs    Medical: Not on file    Non-medical: Not on file  Tobacco Use  . Smoking status: Never Smoker  . Smokeless tobacco: Never Used  Substance and Sexual Activity  . Alcohol use: No  . Drug use: No  . Sexual activity: Not on file  Lifestyle  . Physical activity    Days  per week: Not on file    Minutes per session: Not on file  . Stress: Not on file  Relationships  . Social Musicianconnections    Talks on phone: Not on file    Gets together: Not on file    Attends religious service: Not on file    Active member of club or organization: Not on file    Attends meetings of clubs or organizations: Not on file    Relationship status: Not on file  . Intimate partner violence    Fear of current or ex partner: Not on file    Emotionally abused: Not on file    Physically abused: Not on file    Forced sexual activity: Not on file  Other Topics Concern  . Not on file  Social History Narrative   Widowed 2005 after long time care for husband after stroke   Remarried but widowed again 11/18      No formal living will   Daughter Carney BernJean should be her health care POA   Has DNR already   No feeding tube if cognitively unaware   Review of Systems Appetite is good Weight down slightly Sleeping more and more Bowels are okay with the senna Slight painful callous on side of right foot Some back pain--if sitting up too long    Objective:   Physical Exam  Constitutional: No distress.  Pale with same muscle wasting  Neck: No thyromegaly present.  Cardiovascular: Normal rate, regular rhythm and normal heart sounds. Exam reveals no gallop.  No murmur heard. Respiratory: Effort normal and breath sounds normal. No respiratory distress. She has no wheezes. She has no rales.  Musculoskeletal:        General: No edema.  Lymphadenopathy:    She has no cervical adenopathy.   Neurological:  Somnolent but murmurs some at my presence            Assessment & Plan:

## 2019-07-09 NOTE — Assessment & Plan Note (Signed)
And delusions If this is more persistently a problem, will increase the risperidone

## 2019-07-09 NOTE — Assessment & Plan Note (Signed)
Eats well but has still lost some weight Daughter tries to encourage her

## 2019-07-09 NOTE — Assessment & Plan Note (Signed)
Regular now No Rx for this given hospice/supportive focus

## 2019-07-11 DIAGNOSIS — I252 Old myocardial infarction: Secondary | ICD-10-CM | POA: Diagnosis not present

## 2019-07-11 DIAGNOSIS — I480 Paroxysmal atrial fibrillation: Secondary | ICD-10-CM | POA: Diagnosis not present

## 2019-07-11 DIAGNOSIS — I5022 Chronic systolic (congestive) heart failure: Secondary | ICD-10-CM | POA: Diagnosis not present

## 2019-07-11 DIAGNOSIS — I25119 Atherosclerotic heart disease of native coronary artery with unspecified angina pectoris: Secondary | ICD-10-CM | POA: Diagnosis not present

## 2019-07-11 DIAGNOSIS — I110 Hypertensive heart disease with heart failure: Secondary | ICD-10-CM | POA: Diagnosis not present

## 2019-07-11 DIAGNOSIS — E785 Hyperlipidemia, unspecified: Secondary | ICD-10-CM | POA: Diagnosis not present

## 2019-07-14 DIAGNOSIS — I480 Paroxysmal atrial fibrillation: Secondary | ICD-10-CM | POA: Diagnosis not present

## 2019-07-14 DIAGNOSIS — I5022 Chronic systolic (congestive) heart failure: Secondary | ICD-10-CM | POA: Diagnosis not present

## 2019-07-14 DIAGNOSIS — I252 Old myocardial infarction: Secondary | ICD-10-CM | POA: Diagnosis not present

## 2019-07-14 DIAGNOSIS — I25119 Atherosclerotic heart disease of native coronary artery with unspecified angina pectoris: Secondary | ICD-10-CM | POA: Diagnosis not present

## 2019-07-14 DIAGNOSIS — E785 Hyperlipidemia, unspecified: Secondary | ICD-10-CM | POA: Diagnosis not present

## 2019-07-14 DIAGNOSIS — I110 Hypertensive heart disease with heart failure: Secondary | ICD-10-CM | POA: Diagnosis not present

## 2019-07-15 DIAGNOSIS — I110 Hypertensive heart disease with heart failure: Secondary | ICD-10-CM | POA: Diagnosis not present

## 2019-07-15 DIAGNOSIS — I480 Paroxysmal atrial fibrillation: Secondary | ICD-10-CM | POA: Diagnosis not present

## 2019-07-15 DIAGNOSIS — I25119 Atherosclerotic heart disease of native coronary artery with unspecified angina pectoris: Secondary | ICD-10-CM | POA: Diagnosis not present

## 2019-07-15 DIAGNOSIS — I252 Old myocardial infarction: Secondary | ICD-10-CM | POA: Diagnosis not present

## 2019-07-15 DIAGNOSIS — I5022 Chronic systolic (congestive) heart failure: Secondary | ICD-10-CM | POA: Diagnosis not present

## 2019-07-15 DIAGNOSIS — E785 Hyperlipidemia, unspecified: Secondary | ICD-10-CM | POA: Diagnosis not present

## 2019-07-16 DIAGNOSIS — I480 Paroxysmal atrial fibrillation: Secondary | ICD-10-CM | POA: Diagnosis not present

## 2019-07-16 DIAGNOSIS — I25119 Atherosclerotic heart disease of native coronary artery with unspecified angina pectoris: Secondary | ICD-10-CM | POA: Diagnosis not present

## 2019-07-16 DIAGNOSIS — I5022 Chronic systolic (congestive) heart failure: Secondary | ICD-10-CM | POA: Diagnosis not present

## 2019-07-16 DIAGNOSIS — E785 Hyperlipidemia, unspecified: Secondary | ICD-10-CM | POA: Diagnosis not present

## 2019-07-16 DIAGNOSIS — I252 Old myocardial infarction: Secondary | ICD-10-CM | POA: Diagnosis not present

## 2019-07-16 DIAGNOSIS — I110 Hypertensive heart disease with heart failure: Secondary | ICD-10-CM | POA: Diagnosis not present

## 2019-07-17 DIAGNOSIS — I480 Paroxysmal atrial fibrillation: Secondary | ICD-10-CM | POA: Diagnosis not present

## 2019-07-17 DIAGNOSIS — I252 Old myocardial infarction: Secondary | ICD-10-CM | POA: Diagnosis not present

## 2019-07-17 DIAGNOSIS — I110 Hypertensive heart disease with heart failure: Secondary | ICD-10-CM | POA: Diagnosis not present

## 2019-07-17 DIAGNOSIS — E785 Hyperlipidemia, unspecified: Secondary | ICD-10-CM | POA: Diagnosis not present

## 2019-07-17 DIAGNOSIS — I5022 Chronic systolic (congestive) heart failure: Secondary | ICD-10-CM | POA: Diagnosis not present

## 2019-07-17 DIAGNOSIS — I25119 Atherosclerotic heart disease of native coronary artery with unspecified angina pectoris: Secondary | ICD-10-CM | POA: Diagnosis not present

## 2019-07-18 DIAGNOSIS — I25119 Atherosclerotic heart disease of native coronary artery with unspecified angina pectoris: Secondary | ICD-10-CM | POA: Diagnosis not present

## 2019-07-18 DIAGNOSIS — I5022 Chronic systolic (congestive) heart failure: Secondary | ICD-10-CM | POA: Diagnosis not present

## 2019-07-18 DIAGNOSIS — I110 Hypertensive heart disease with heart failure: Secondary | ICD-10-CM | POA: Diagnosis not present

## 2019-07-18 DIAGNOSIS — E785 Hyperlipidemia, unspecified: Secondary | ICD-10-CM | POA: Diagnosis not present

## 2019-07-18 DIAGNOSIS — I252 Old myocardial infarction: Secondary | ICD-10-CM | POA: Diagnosis not present

## 2019-07-18 DIAGNOSIS — I480 Paroxysmal atrial fibrillation: Secondary | ICD-10-CM | POA: Diagnosis not present

## 2019-07-19 DIAGNOSIS — Z8781 Personal history of (healed) traumatic fracture: Secondary | ICD-10-CM | POA: Diagnosis not present

## 2019-07-19 DIAGNOSIS — I5022 Chronic systolic (congestive) heart failure: Secondary | ICD-10-CM | POA: Diagnosis not present

## 2019-07-19 DIAGNOSIS — D500 Iron deficiency anemia secondary to blood loss (chronic): Secondary | ICD-10-CM | POA: Diagnosis not present

## 2019-07-19 DIAGNOSIS — R627 Adult failure to thrive: Secondary | ICD-10-CM | POA: Diagnosis not present

## 2019-07-19 DIAGNOSIS — I480 Paroxysmal atrial fibrillation: Secondary | ICD-10-CM | POA: Diagnosis not present

## 2019-07-19 DIAGNOSIS — G4733 Obstructive sleep apnea (adult) (pediatric): Secondary | ICD-10-CM | POA: Diagnosis not present

## 2019-07-19 DIAGNOSIS — Z681 Body mass index (BMI) 19 or less, adult: Secondary | ICD-10-CM | POA: Diagnosis not present

## 2019-07-19 DIAGNOSIS — I252 Old myocardial infarction: Secondary | ICD-10-CM | POA: Diagnosis not present

## 2019-07-19 DIAGNOSIS — Z8719 Personal history of other diseases of the digestive system: Secondary | ICD-10-CM | POA: Diagnosis not present

## 2019-07-19 DIAGNOSIS — Z741 Need for assistance with personal care: Secondary | ICD-10-CM | POA: Diagnosis not present

## 2019-07-19 DIAGNOSIS — E785 Hyperlipidemia, unspecified: Secondary | ICD-10-CM | POA: Diagnosis not present

## 2019-07-19 DIAGNOSIS — I25119 Atherosclerotic heart disease of native coronary artery with unspecified angina pectoris: Secondary | ICD-10-CM | POA: Diagnosis not present

## 2019-07-19 DIAGNOSIS — R634 Abnormal weight loss: Secondary | ICD-10-CM | POA: Diagnosis not present

## 2019-07-19 DIAGNOSIS — I110 Hypertensive heart disease with heart failure: Secondary | ICD-10-CM | POA: Diagnosis not present

## 2019-07-19 DIAGNOSIS — R296 Repeated falls: Secondary | ICD-10-CM | POA: Diagnosis not present

## 2019-07-20 DIAGNOSIS — I252 Old myocardial infarction: Secondary | ICD-10-CM | POA: Diagnosis not present

## 2019-07-20 DIAGNOSIS — I5022 Chronic systolic (congestive) heart failure: Secondary | ICD-10-CM | POA: Diagnosis not present

## 2019-07-20 DIAGNOSIS — I480 Paroxysmal atrial fibrillation: Secondary | ICD-10-CM | POA: Diagnosis not present

## 2019-07-20 DIAGNOSIS — I25119 Atherosclerotic heart disease of native coronary artery with unspecified angina pectoris: Secondary | ICD-10-CM | POA: Diagnosis not present

## 2019-07-20 DIAGNOSIS — E785 Hyperlipidemia, unspecified: Secondary | ICD-10-CM | POA: Diagnosis not present

## 2019-07-20 DIAGNOSIS — I110 Hypertensive heart disease with heart failure: Secondary | ICD-10-CM | POA: Diagnosis not present

## 2019-07-21 DIAGNOSIS — I5022 Chronic systolic (congestive) heart failure: Secondary | ICD-10-CM | POA: Diagnosis not present

## 2019-07-21 DIAGNOSIS — I110 Hypertensive heart disease with heart failure: Secondary | ICD-10-CM | POA: Diagnosis not present

## 2019-07-21 DIAGNOSIS — I252 Old myocardial infarction: Secondary | ICD-10-CM | POA: Diagnosis not present

## 2019-07-21 DIAGNOSIS — E785 Hyperlipidemia, unspecified: Secondary | ICD-10-CM | POA: Diagnosis not present

## 2019-07-21 DIAGNOSIS — I480 Paroxysmal atrial fibrillation: Secondary | ICD-10-CM | POA: Diagnosis not present

## 2019-07-21 DIAGNOSIS — I25119 Atherosclerotic heart disease of native coronary artery with unspecified angina pectoris: Secondary | ICD-10-CM | POA: Diagnosis not present

## 2019-07-22 DIAGNOSIS — E785 Hyperlipidemia, unspecified: Secondary | ICD-10-CM | POA: Diagnosis not present

## 2019-07-22 DIAGNOSIS — I25119 Atherosclerotic heart disease of native coronary artery with unspecified angina pectoris: Secondary | ICD-10-CM | POA: Diagnosis not present

## 2019-07-22 DIAGNOSIS — I252 Old myocardial infarction: Secondary | ICD-10-CM | POA: Diagnosis not present

## 2019-07-22 DIAGNOSIS — I480 Paroxysmal atrial fibrillation: Secondary | ICD-10-CM | POA: Diagnosis not present

## 2019-07-22 DIAGNOSIS — I110 Hypertensive heart disease with heart failure: Secondary | ICD-10-CM | POA: Diagnosis not present

## 2019-07-22 DIAGNOSIS — I5022 Chronic systolic (congestive) heart failure: Secondary | ICD-10-CM | POA: Diagnosis not present

## 2019-07-24 DIAGNOSIS — I5022 Chronic systolic (congestive) heart failure: Secondary | ICD-10-CM | POA: Diagnosis not present

## 2019-07-24 DIAGNOSIS — E785 Hyperlipidemia, unspecified: Secondary | ICD-10-CM | POA: Diagnosis not present

## 2019-07-24 DIAGNOSIS — I480 Paroxysmal atrial fibrillation: Secondary | ICD-10-CM | POA: Diagnosis not present

## 2019-07-24 DIAGNOSIS — I25119 Atherosclerotic heart disease of native coronary artery with unspecified angina pectoris: Secondary | ICD-10-CM | POA: Diagnosis not present

## 2019-07-24 DIAGNOSIS — I110 Hypertensive heart disease with heart failure: Secondary | ICD-10-CM | POA: Diagnosis not present

## 2019-07-24 DIAGNOSIS — I252 Old myocardial infarction: Secondary | ICD-10-CM | POA: Diagnosis not present

## 2019-07-25 DIAGNOSIS — I5022 Chronic systolic (congestive) heart failure: Secondary | ICD-10-CM | POA: Diagnosis not present

## 2019-07-25 DIAGNOSIS — I252 Old myocardial infarction: Secondary | ICD-10-CM | POA: Diagnosis not present

## 2019-07-25 DIAGNOSIS — I25119 Atherosclerotic heart disease of native coronary artery with unspecified angina pectoris: Secondary | ICD-10-CM | POA: Diagnosis not present

## 2019-07-25 DIAGNOSIS — I480 Paroxysmal atrial fibrillation: Secondary | ICD-10-CM | POA: Diagnosis not present

## 2019-07-25 DIAGNOSIS — I110 Hypertensive heart disease with heart failure: Secondary | ICD-10-CM | POA: Diagnosis not present

## 2019-07-25 DIAGNOSIS — E785 Hyperlipidemia, unspecified: Secondary | ICD-10-CM | POA: Diagnosis not present

## 2019-07-29 DIAGNOSIS — E785 Hyperlipidemia, unspecified: Secondary | ICD-10-CM | POA: Diagnosis not present

## 2019-07-29 DIAGNOSIS — I25119 Atherosclerotic heart disease of native coronary artery with unspecified angina pectoris: Secondary | ICD-10-CM | POA: Diagnosis not present

## 2019-07-29 DIAGNOSIS — I252 Old myocardial infarction: Secondary | ICD-10-CM | POA: Diagnosis not present

## 2019-07-29 DIAGNOSIS — I480 Paroxysmal atrial fibrillation: Secondary | ICD-10-CM | POA: Diagnosis not present

## 2019-07-29 DIAGNOSIS — I110 Hypertensive heart disease with heart failure: Secondary | ICD-10-CM | POA: Diagnosis not present

## 2019-07-29 DIAGNOSIS — I5022 Chronic systolic (congestive) heart failure: Secondary | ICD-10-CM | POA: Diagnosis not present

## 2019-07-30 ENCOUNTER — Telehealth: Payer: Self-pay | Admitting: Internal Medicine

## 2019-07-30 DIAGNOSIS — I5022 Chronic systolic (congestive) heart failure: Secondary | ICD-10-CM | POA: Diagnosis not present

## 2019-07-30 DIAGNOSIS — I252 Old myocardial infarction: Secondary | ICD-10-CM | POA: Diagnosis not present

## 2019-07-30 DIAGNOSIS — I25119 Atherosclerotic heart disease of native coronary artery with unspecified angina pectoris: Secondary | ICD-10-CM | POA: Diagnosis not present

## 2019-07-30 DIAGNOSIS — E785 Hyperlipidemia, unspecified: Secondary | ICD-10-CM | POA: Diagnosis not present

## 2019-07-30 DIAGNOSIS — I480 Paroxysmal atrial fibrillation: Secondary | ICD-10-CM | POA: Diagnosis not present

## 2019-07-30 DIAGNOSIS — I110 Hypertensive heart disease with heart failure: Secondary | ICD-10-CM | POA: Diagnosis not present

## 2019-07-30 NOTE — Telephone Encounter (Signed)
Phone call from Christina [name] More confusion and agitation Will increase the risperdal to 0.5 daily (at night or 0.25 bid

## 2019-08-01 DIAGNOSIS — I5022 Chronic systolic (congestive) heart failure: Secondary | ICD-10-CM | POA: Diagnosis not present

## 2019-08-01 DIAGNOSIS — I252 Old myocardial infarction: Secondary | ICD-10-CM | POA: Diagnosis not present

## 2019-08-01 DIAGNOSIS — I25119 Atherosclerotic heart disease of native coronary artery with unspecified angina pectoris: Secondary | ICD-10-CM | POA: Diagnosis not present

## 2019-08-01 DIAGNOSIS — I110 Hypertensive heart disease with heart failure: Secondary | ICD-10-CM | POA: Diagnosis not present

## 2019-08-01 DIAGNOSIS — E785 Hyperlipidemia, unspecified: Secondary | ICD-10-CM | POA: Diagnosis not present

## 2019-08-01 DIAGNOSIS — I480 Paroxysmal atrial fibrillation: Secondary | ICD-10-CM | POA: Diagnosis not present

## 2019-08-05 DIAGNOSIS — E785 Hyperlipidemia, unspecified: Secondary | ICD-10-CM | POA: Diagnosis not present

## 2019-08-05 DIAGNOSIS — I252 Old myocardial infarction: Secondary | ICD-10-CM | POA: Diagnosis not present

## 2019-08-05 DIAGNOSIS — I110 Hypertensive heart disease with heart failure: Secondary | ICD-10-CM | POA: Diagnosis not present

## 2019-08-05 DIAGNOSIS — I5022 Chronic systolic (congestive) heart failure: Secondary | ICD-10-CM | POA: Diagnosis not present

## 2019-08-05 DIAGNOSIS — I25119 Atherosclerotic heart disease of native coronary artery with unspecified angina pectoris: Secondary | ICD-10-CM | POA: Diagnosis not present

## 2019-08-05 DIAGNOSIS — I480 Paroxysmal atrial fibrillation: Secondary | ICD-10-CM | POA: Diagnosis not present

## 2019-08-07 DIAGNOSIS — I480 Paroxysmal atrial fibrillation: Secondary | ICD-10-CM | POA: Diagnosis not present

## 2019-08-07 DIAGNOSIS — I5022 Chronic systolic (congestive) heart failure: Secondary | ICD-10-CM | POA: Diagnosis not present

## 2019-08-07 DIAGNOSIS — I110 Hypertensive heart disease with heart failure: Secondary | ICD-10-CM | POA: Diagnosis not present

## 2019-08-07 DIAGNOSIS — E785 Hyperlipidemia, unspecified: Secondary | ICD-10-CM | POA: Diagnosis not present

## 2019-08-07 DIAGNOSIS — I252 Old myocardial infarction: Secondary | ICD-10-CM | POA: Diagnosis not present

## 2019-08-07 DIAGNOSIS — I25119 Atherosclerotic heart disease of native coronary artery with unspecified angina pectoris: Secondary | ICD-10-CM | POA: Diagnosis not present

## 2019-08-08 DIAGNOSIS — I252 Old myocardial infarction: Secondary | ICD-10-CM | POA: Diagnosis not present

## 2019-08-08 DIAGNOSIS — E785 Hyperlipidemia, unspecified: Secondary | ICD-10-CM | POA: Diagnosis not present

## 2019-08-08 DIAGNOSIS — I5022 Chronic systolic (congestive) heart failure: Secondary | ICD-10-CM | POA: Diagnosis not present

## 2019-08-08 DIAGNOSIS — I480 Paroxysmal atrial fibrillation: Secondary | ICD-10-CM | POA: Diagnosis not present

## 2019-08-08 DIAGNOSIS — I110 Hypertensive heart disease with heart failure: Secondary | ICD-10-CM | POA: Diagnosis not present

## 2019-08-08 DIAGNOSIS — I25119 Atherosclerotic heart disease of native coronary artery with unspecified angina pectoris: Secondary | ICD-10-CM | POA: Diagnosis not present

## 2019-08-12 DIAGNOSIS — I480 Paroxysmal atrial fibrillation: Secondary | ICD-10-CM | POA: Diagnosis not present

## 2019-08-12 DIAGNOSIS — I25119 Atherosclerotic heart disease of native coronary artery with unspecified angina pectoris: Secondary | ICD-10-CM | POA: Diagnosis not present

## 2019-08-12 DIAGNOSIS — I252 Old myocardial infarction: Secondary | ICD-10-CM | POA: Diagnosis not present

## 2019-08-12 DIAGNOSIS — E785 Hyperlipidemia, unspecified: Secondary | ICD-10-CM | POA: Diagnosis not present

## 2019-08-12 DIAGNOSIS — I5022 Chronic systolic (congestive) heart failure: Secondary | ICD-10-CM | POA: Diagnosis not present

## 2019-08-12 DIAGNOSIS — I110 Hypertensive heart disease with heart failure: Secondary | ICD-10-CM | POA: Diagnosis not present

## 2019-08-13 DIAGNOSIS — I25119 Atherosclerotic heart disease of native coronary artery with unspecified angina pectoris: Secondary | ICD-10-CM | POA: Diagnosis not present

## 2019-08-13 DIAGNOSIS — I110 Hypertensive heart disease with heart failure: Secondary | ICD-10-CM | POA: Diagnosis not present

## 2019-08-13 DIAGNOSIS — I480 Paroxysmal atrial fibrillation: Secondary | ICD-10-CM | POA: Diagnosis not present

## 2019-08-13 DIAGNOSIS — I5022 Chronic systolic (congestive) heart failure: Secondary | ICD-10-CM | POA: Diagnosis not present

## 2019-08-13 DIAGNOSIS — I252 Old myocardial infarction: Secondary | ICD-10-CM | POA: Diagnosis not present

## 2019-08-13 DIAGNOSIS — E785 Hyperlipidemia, unspecified: Secondary | ICD-10-CM | POA: Diagnosis not present

## 2019-08-14 ENCOUNTER — Telehealth: Payer: Self-pay

## 2019-08-14 NOTE — Telephone Encounter (Signed)
Christina [name] (DPR signed) received automated call and wants to know if was about a flu shot or what. Carrie at front desk said there have been automated calls about flu shots recently.  Christina [name] will talk with Dr Silvio Pate at next home visit if he wants pt to have a flushot; pt did not get flushot last year.. Nothing further needed.

## 2019-08-19 DIAGNOSIS — E785 Hyperlipidemia, unspecified: Secondary | ICD-10-CM | POA: Diagnosis not present

## 2019-08-19 DIAGNOSIS — Z8719 Personal history of other diseases of the digestive system: Secondary | ICD-10-CM | POA: Diagnosis not present

## 2019-08-19 DIAGNOSIS — Z8781 Personal history of (healed) traumatic fracture: Secondary | ICD-10-CM | POA: Diagnosis not present

## 2019-08-19 DIAGNOSIS — I252 Old myocardial infarction: Secondary | ICD-10-CM | POA: Diagnosis not present

## 2019-08-19 DIAGNOSIS — I25119 Atherosclerotic heart disease of native coronary artery with unspecified angina pectoris: Secondary | ICD-10-CM | POA: Diagnosis not present

## 2019-08-19 DIAGNOSIS — I5022 Chronic systolic (congestive) heart failure: Secondary | ICD-10-CM | POA: Diagnosis not present

## 2019-08-19 DIAGNOSIS — D500 Iron deficiency anemia secondary to blood loss (chronic): Secondary | ICD-10-CM | POA: Diagnosis not present

## 2019-08-19 DIAGNOSIS — Z681 Body mass index (BMI) 19 or less, adult: Secondary | ICD-10-CM | POA: Diagnosis not present

## 2019-08-19 DIAGNOSIS — R296 Repeated falls: Secondary | ICD-10-CM | POA: Diagnosis not present

## 2019-08-19 DIAGNOSIS — Z741 Need for assistance with personal care: Secondary | ICD-10-CM | POA: Diagnosis not present

## 2019-08-19 DIAGNOSIS — I110 Hypertensive heart disease with heart failure: Secondary | ICD-10-CM | POA: Diagnosis not present

## 2019-08-19 DIAGNOSIS — R627 Adult failure to thrive: Secondary | ICD-10-CM | POA: Diagnosis not present

## 2019-08-19 DIAGNOSIS — I480 Paroxysmal atrial fibrillation: Secondary | ICD-10-CM | POA: Diagnosis not present

## 2019-08-19 DIAGNOSIS — R634 Abnormal weight loss: Secondary | ICD-10-CM | POA: Diagnosis not present

## 2019-08-19 DIAGNOSIS — G4733 Obstructive sleep apnea (adult) (pediatric): Secondary | ICD-10-CM | POA: Diagnosis not present

## 2019-08-20 DIAGNOSIS — I110 Hypertensive heart disease with heart failure: Secondary | ICD-10-CM | POA: Diagnosis not present

## 2019-08-20 DIAGNOSIS — I252 Old myocardial infarction: Secondary | ICD-10-CM | POA: Diagnosis not present

## 2019-08-20 DIAGNOSIS — I480 Paroxysmal atrial fibrillation: Secondary | ICD-10-CM | POA: Diagnosis not present

## 2019-08-20 DIAGNOSIS — I25119 Atherosclerotic heart disease of native coronary artery with unspecified angina pectoris: Secondary | ICD-10-CM | POA: Diagnosis not present

## 2019-08-20 DIAGNOSIS — E785 Hyperlipidemia, unspecified: Secondary | ICD-10-CM | POA: Diagnosis not present

## 2019-08-20 DIAGNOSIS — I5022 Chronic systolic (congestive) heart failure: Secondary | ICD-10-CM | POA: Diagnosis not present

## 2019-08-22 DIAGNOSIS — I110 Hypertensive heart disease with heart failure: Secondary | ICD-10-CM | POA: Diagnosis not present

## 2019-08-22 DIAGNOSIS — I25119 Atherosclerotic heart disease of native coronary artery with unspecified angina pectoris: Secondary | ICD-10-CM | POA: Diagnosis not present

## 2019-08-22 DIAGNOSIS — I252 Old myocardial infarction: Secondary | ICD-10-CM | POA: Diagnosis not present

## 2019-08-22 DIAGNOSIS — I480 Paroxysmal atrial fibrillation: Secondary | ICD-10-CM | POA: Diagnosis not present

## 2019-08-22 DIAGNOSIS — E785 Hyperlipidemia, unspecified: Secondary | ICD-10-CM | POA: Diagnosis not present

## 2019-08-22 DIAGNOSIS — I5022 Chronic systolic (congestive) heart failure: Secondary | ICD-10-CM | POA: Diagnosis not present

## 2019-08-26 DIAGNOSIS — I110 Hypertensive heart disease with heart failure: Secondary | ICD-10-CM | POA: Diagnosis not present

## 2019-08-26 DIAGNOSIS — I25119 Atherosclerotic heart disease of native coronary artery with unspecified angina pectoris: Secondary | ICD-10-CM | POA: Diagnosis not present

## 2019-08-26 DIAGNOSIS — I480 Paroxysmal atrial fibrillation: Secondary | ICD-10-CM | POA: Diagnosis not present

## 2019-08-26 DIAGNOSIS — E785 Hyperlipidemia, unspecified: Secondary | ICD-10-CM | POA: Diagnosis not present

## 2019-08-26 DIAGNOSIS — I5022 Chronic systolic (congestive) heart failure: Secondary | ICD-10-CM | POA: Diagnosis not present

## 2019-08-26 DIAGNOSIS — I252 Old myocardial infarction: Secondary | ICD-10-CM | POA: Diagnosis not present

## 2019-08-28 DIAGNOSIS — I25119 Atherosclerotic heart disease of native coronary artery with unspecified angina pectoris: Secondary | ICD-10-CM | POA: Diagnosis not present

## 2019-08-28 DIAGNOSIS — I480 Paroxysmal atrial fibrillation: Secondary | ICD-10-CM | POA: Diagnosis not present

## 2019-08-28 DIAGNOSIS — I5022 Chronic systolic (congestive) heart failure: Secondary | ICD-10-CM | POA: Diagnosis not present

## 2019-08-28 DIAGNOSIS — E785 Hyperlipidemia, unspecified: Secondary | ICD-10-CM | POA: Diagnosis not present

## 2019-08-28 DIAGNOSIS — I252 Old myocardial infarction: Secondary | ICD-10-CM | POA: Diagnosis not present

## 2019-08-28 DIAGNOSIS — I110 Hypertensive heart disease with heart failure: Secondary | ICD-10-CM | POA: Diagnosis not present

## 2019-08-29 DIAGNOSIS — E785 Hyperlipidemia, unspecified: Secondary | ICD-10-CM | POA: Diagnosis not present

## 2019-08-29 DIAGNOSIS — I252 Old myocardial infarction: Secondary | ICD-10-CM | POA: Diagnosis not present

## 2019-08-29 DIAGNOSIS — I480 Paroxysmal atrial fibrillation: Secondary | ICD-10-CM | POA: Diagnosis not present

## 2019-08-29 DIAGNOSIS — I110 Hypertensive heart disease with heart failure: Secondary | ICD-10-CM | POA: Diagnosis not present

## 2019-08-29 DIAGNOSIS — I25119 Atherosclerotic heart disease of native coronary artery with unspecified angina pectoris: Secondary | ICD-10-CM | POA: Diagnosis not present

## 2019-08-29 DIAGNOSIS — I5022 Chronic systolic (congestive) heart failure: Secondary | ICD-10-CM | POA: Diagnosis not present

## 2019-09-02 DIAGNOSIS — I25119 Atherosclerotic heart disease of native coronary artery with unspecified angina pectoris: Secondary | ICD-10-CM | POA: Diagnosis not present

## 2019-09-02 DIAGNOSIS — E785 Hyperlipidemia, unspecified: Secondary | ICD-10-CM | POA: Diagnosis not present

## 2019-09-02 DIAGNOSIS — I110 Hypertensive heart disease with heart failure: Secondary | ICD-10-CM | POA: Diagnosis not present

## 2019-09-02 DIAGNOSIS — I5022 Chronic systolic (congestive) heart failure: Secondary | ICD-10-CM | POA: Diagnosis not present

## 2019-09-02 DIAGNOSIS — I480 Paroxysmal atrial fibrillation: Secondary | ICD-10-CM | POA: Diagnosis not present

## 2019-09-02 DIAGNOSIS — I252 Old myocardial infarction: Secondary | ICD-10-CM | POA: Diagnosis not present

## 2019-09-03 DIAGNOSIS — E785 Hyperlipidemia, unspecified: Secondary | ICD-10-CM | POA: Diagnosis not present

## 2019-09-03 DIAGNOSIS — I5022 Chronic systolic (congestive) heart failure: Secondary | ICD-10-CM | POA: Diagnosis not present

## 2019-09-03 DIAGNOSIS — I480 Paroxysmal atrial fibrillation: Secondary | ICD-10-CM | POA: Diagnosis not present

## 2019-09-03 DIAGNOSIS — I25119 Atherosclerotic heart disease of native coronary artery with unspecified angina pectoris: Secondary | ICD-10-CM | POA: Diagnosis not present

## 2019-09-03 DIAGNOSIS — I252 Old myocardial infarction: Secondary | ICD-10-CM | POA: Diagnosis not present

## 2019-09-03 DIAGNOSIS — I110 Hypertensive heart disease with heart failure: Secondary | ICD-10-CM | POA: Diagnosis not present

## 2019-09-05 ENCOUNTER — Other Ambulatory Visit: Payer: Self-pay | Admitting: Internal Medicine

## 2019-09-05 DIAGNOSIS — E785 Hyperlipidemia, unspecified: Secondary | ICD-10-CM | POA: Diagnosis not present

## 2019-09-05 DIAGNOSIS — I480 Paroxysmal atrial fibrillation: Secondary | ICD-10-CM | POA: Diagnosis not present

## 2019-09-05 DIAGNOSIS — I252 Old myocardial infarction: Secondary | ICD-10-CM | POA: Diagnosis not present

## 2019-09-05 DIAGNOSIS — I110 Hypertensive heart disease with heart failure: Secondary | ICD-10-CM | POA: Diagnosis not present

## 2019-09-05 DIAGNOSIS — I5022 Chronic systolic (congestive) heart failure: Secondary | ICD-10-CM | POA: Diagnosis not present

## 2019-09-05 DIAGNOSIS — I25119 Atherosclerotic heart disease of native coronary artery with unspecified angina pectoris: Secondary | ICD-10-CM | POA: Diagnosis not present

## 2019-09-09 DIAGNOSIS — I5022 Chronic systolic (congestive) heart failure: Secondary | ICD-10-CM | POA: Diagnosis not present

## 2019-09-09 DIAGNOSIS — I252 Old myocardial infarction: Secondary | ICD-10-CM | POA: Diagnosis not present

## 2019-09-09 DIAGNOSIS — I480 Paroxysmal atrial fibrillation: Secondary | ICD-10-CM | POA: Diagnosis not present

## 2019-09-09 DIAGNOSIS — E785 Hyperlipidemia, unspecified: Secondary | ICD-10-CM | POA: Diagnosis not present

## 2019-09-09 DIAGNOSIS — I110 Hypertensive heart disease with heart failure: Secondary | ICD-10-CM | POA: Diagnosis not present

## 2019-09-09 DIAGNOSIS — I25119 Atherosclerotic heart disease of native coronary artery with unspecified angina pectoris: Secondary | ICD-10-CM | POA: Diagnosis not present

## 2019-09-10 ENCOUNTER — Encounter: Payer: Self-pay | Admitting: Internal Medicine

## 2019-09-10 ENCOUNTER — Ambulatory Visit: Payer: Medicare Other | Admitting: Internal Medicine

## 2019-09-10 DIAGNOSIS — I110 Hypertensive heart disease with heart failure: Secondary | ICD-10-CM | POA: Diagnosis not present

## 2019-09-10 DIAGNOSIS — F0151 Vascular dementia with behavioral disturbance: Secondary | ICD-10-CM | POA: Diagnosis not present

## 2019-09-10 DIAGNOSIS — I25119 Atherosclerotic heart disease of native coronary artery with unspecified angina pectoris: Secondary | ICD-10-CM

## 2019-09-10 DIAGNOSIS — I5022 Chronic systolic (congestive) heart failure: Secondary | ICD-10-CM | POA: Diagnosis not present

## 2019-09-10 DIAGNOSIS — E785 Hyperlipidemia, unspecified: Secondary | ICD-10-CM | POA: Diagnosis not present

## 2019-09-10 DIAGNOSIS — E440 Moderate protein-calorie malnutrition: Secondary | ICD-10-CM

## 2019-09-10 DIAGNOSIS — I252 Old myocardial infarction: Secondary | ICD-10-CM | POA: Diagnosis not present

## 2019-09-10 DIAGNOSIS — F01518 Vascular dementia, unspecified severity, with other behavioral disturbance: Secondary | ICD-10-CM

## 2019-09-10 DIAGNOSIS — F29 Unspecified psychosis not due to a substance or known physiological condition: Secondary | ICD-10-CM

## 2019-09-10 DIAGNOSIS — I480 Paroxysmal atrial fibrillation: Secondary | ICD-10-CM | POA: Diagnosis not present

## 2019-09-10 NOTE — Assessment & Plan Note (Signed)
Gets severe DOE with even brief walking--but better with rest Has nitro and morphine for prn

## 2019-09-10 NOTE — Progress Notes (Signed)
Subjective:    Patient ID: Christina [name], female    DOB: 1920/06/28, 85 y.o.   MRN: [id number]  HPI Home visit for this home bound patient Daughter here as usual and Gaffer from [employer]  No chest pain Has not needed nitro or morphine Breathing is "pretty good" Saturations stay in high 90's  Now talking about "sending my husband away---he is in the service" Did have more psychotic behavior---more hallucinations and delusions Risperidone increased to 0.5mg  at bedtime This helps most of the time--but has occasional bad night (and keeps daughter up)  Is considering hiring the [place] group to do home care Still having no respite at this time  Can stand at times for transfers Will occasionally walk without someone around---but hard to get her to do it when you are trying Gets exhausted with minimal activity though Generally continent Needs assistance with all ADLs---but does eat independently  Gets tylenol prn for headache Gets tramadol rarely for pain---hard to tell where her pain is when she has it  Current Outpatient Medications on File Prior to Visit  Medication Sig Dispense Refill  . acetaminophen (TYLENOL 8 HOUR) 650 MG CR tablet Take 650-1,300 mg by mouth every 8 (eight) hours as needed for pain. Do not exceed 3000 mg / 24 hrs of acetaminophen. Consider all sources    . Morphine Sulfate (MORPHINE CONCENTRATE) 10 mg / 0.5 ml concentrated solution Take 0.25-0.5 mLs (5-10 mg total) by mouth every 3 (three) hours as needed for severe pain or shortness of breath. 30 mL 0  . nitroGLYCERIN (NITROSTAT) 0.4 MG SL tablet PLACE 1 TABLET (0.4 MG TOTAL) UNDER THE TONGUE EVERY 5 (FIVE) MINUTES AS NEEDED FOR CHEST PAIN. 25 tablet 0  . traMADol (ULTRAM) 50 MG tablet TAKE 0.5-1 TABLETS (25-50 MG TOTAL) BY MOUTH 3 (THREE) TIMES DAILY AS NEEDED. (Patient taking differently: Take 25-50 mg by mouth 3 (three) times daily as needed for moderate pain. ) 60 tablet 0   No current  facility-administered medications on file prior to visit.     Allergies  Allergen Reactions  . Bee Venom Rash  . Plavix [Clopidogrel] Rash  . Atorvastatin Other (See Comments)    Reaction:  Blisters on feet   . Diltiazem Hcl Other (See Comments)    Reaction:  Unknown   . Dye Fdc Red [Red Dye] Other (See Comments)    Reaction:  Unknown   . Gemfibrozil Other (See Comments)    Reaction:  Unknown   . Niacin Other (See Comments)    Reaction:  Unknown   . Sulfasalazine Rash    Past Medical History:  Diagnosis Date  . CAD (coronary artery disease)    a. S/P previous MI;  b. 04/2007 Cath/PCI: Taxus DES' to mid/distal RCA and RPDA as well as Ramus;  c. myoview 8/09: EF 78%, normal perfusion; d. 12/2012 Low risk MV; e. 08/2017 NSTEMI->Med Rx.  . Carotid stenosis    Bilateral, mild to moderate  . Chronic back pain   . Chronic shoulder pain   . HFrEF (heart failure with reduced ejection fraction) (HCC)    a. 08/2017 Echo: EF 20-25%, mid-apicalanteroseptal, ant, antlat, and apical sev HK. Mild MR, mildly dil LA, nl RV fxn.  . Hyperlipidemia    Intolerant of many statins  . Hypertension   . Ischemic cardiomyopathy    a. 08/2017 Echo: EF 20-25%, mid-apicalanteroseptal, ant, antlat, and apical sev HK.  . Mild mitral and aortic regurgitation    a. 11/2011  Echo: EF 55-65%, No RWMA, Gr 1 DD, Mild AI/MR; b. 08/2017 Echo: Mild MR.  Marland Kitchen Neuropathy   . Osteoarthritis   . Osteoporosis   . PAF (paroxysmal atrial fibrillation) ([place])    a. 08/2017 - noted @ time of NSTEMI. CHA2DS2VASc = 5-->ASA only due to age and h/o falls;  c. On Amio.  Marland Kitchen Pelvic fracture ([place]) 6/16  . Peripheral vascular disease [facility])     Past Surgical History:  Procedure Laterality Date  . 2 broken arms     . ABDOMINAL HYSTERECTOMY    . ADENOSINE MYOVIEW  01/2006   No ischemia, EF 70%  . BREAST BIOPSY     Bilateral  . CAROTID U/S  10/2006   0-39% bilaterally  . CATARACT EXTRACTION  09/2006   Right  . CORONARY ANGIOPLASTY   12/2002   Medical rx only  . CORONARY ANGIOPLASTY  01/2006   LAD/PAD disease  . CORONARY ANGIOPLASTY  03/2007   3 vessel disease  . CORONARY STENT PLACEMENT  2002   MI, [facility]  . CORONARY STENT PLACEMENT  04/2007   RCA/LCX multiple, [facility]  . CP ADMIT  09/2005   Stress myoview negative  . FEMORAL HERNIA REPAIR  05/2007   Incarcerated, right  . FRACTURE SURGERY  03/2009   Left leg  . hip fracture repair Bilateral   . INTRAMEDULLARY (IM) NAIL INTERTROCHANTERIC Right 05/18/2016   Procedure: INTRAMEDULLARY (IM) NAIL INTERTROCHANTRIC;  Surgeon: Thornton Park, MD;  Location: [facility] ORS;  Service: Orthopedics;  Laterality: Right;  . INTRAMEDULLARY (IM) NAIL INTERTROCHANTERIC Left 06/18/2017   Procedure: INTRAMEDULLARY (IM) NAIL INTERTROCHANTRIC;  Surgeon: Earnestine Leys, MD;  Location: [facility] ORS;  Service: Orthopedics;  Laterality: Left;  . NM MYOVIEW LTD  11/2004   Stress, negative; NL EF  . two broken wrist on left arm,    . VESICOVAGINAL FISTULA CLOSURE W/ TAH    . VISCERAL ARTERY INTERVENTION N/A 04/22/2018   Procedure: VISCERAL ARTERY INTERVENTION;  Surgeon: Algernon Huxley, MD;  Location: [facility];  Service: Cardiovascular;  Laterality: N/A;    Family History  Problem Relation Age of Onset  . Heart disease Mother   . Pulmonary embolism Father   . Heart disease Sister   . Cancer Brother   . Cancer Sister     Social History   Socioeconomic History  . Marital status: Widowed    Spouse name: Not on file  . Number of children: 3  . Years of education: 80  . Highest education level: Not on file  Occupational History    Comment: retired from retail  Social Needs  . Financial resource strain: Not on file  . Food insecurity    Worry: Not on file    Inability: Not on file  . Transportation needs    Medical: Not on file    Non-medical: Not on file  Tobacco Use  . Smoking status: Never Smoker  . Smokeless tobacco: Never Used  Substance and Sexual Activity  . Alcohol  use: No  . Drug use: No  . Sexual activity: Not on file  Lifestyle  . Physical activity    Days per week: Not on file    Minutes per session: Not on file  . Stress: Not on file  Relationships  . Social Herbalist on phone: Not on file    Gets together: Not on file    Attends religious service: Not on file    Active member of club or  organization: Not on file    Attends meetings of clubs or organizations: Not on file    Relationship status: Not on file  . Intimate partner violence    Fear of current or ex partner: Not on file    Emotionally abused: Not on file    Physically abused: Not on file    Forced sexual activity: Not on file  Other Topics Concern  . Not on file  Social History Narrative   Widowed 2005 after long time care for husband after stroke   Remarried but widowed again 11/18      No formal living will   Daughter [name] should be her health care POA   Has DNR already   No feeding tube if cognitively unaware    Review of Systems Eats well still Weight seems stable but not able to weigh her Has lost arm circumference    Objective:   Physical Exam  Constitutional: No distress.  Still appears wasted--about the same  Neck: No thyromegaly present.  Cardiovascular: Normal rate, regular rhythm and normal heart sounds. Exam reveals no gallop.  No murmur heard. Respiratory: Effort normal. No respiratory distress. She has no wheezes.  Faint bibasilar crackles  GI: Soft. There is no abdominal tenderness.  Musculoskeletal:        General: No edema.  Lymphadenopathy:    She has no cervical adenopathy.  Psychiatric: She has a normal mood and affect. Her behavior is normal.           Assessment & Plan:

## 2019-09-10 NOTE — Assessment & Plan Note (Signed)
Eats well but doesn't seem to gain weight Has lost some arm circumference Daughter encourages her

## 2019-09-10 NOTE — Assessment & Plan Note (Signed)
Seems compensated without meds Curious but she is stable

## 2019-09-10 NOTE — Assessment & Plan Note (Signed)
Hallucinations better with higher risperidone dose Still delusional but no agitation

## 2019-09-10 NOTE — Assessment & Plan Note (Signed)
Stable cognitive status Needs help with all ADLs except eating Likely vascular

## 2019-09-12 DIAGNOSIS — E785 Hyperlipidemia, unspecified: Secondary | ICD-10-CM | POA: Diagnosis not present

## 2019-09-12 DIAGNOSIS — I5022 Chronic systolic (congestive) heart failure: Secondary | ICD-10-CM | POA: Diagnosis not present

## 2019-09-12 DIAGNOSIS — I252 Old myocardial infarction: Secondary | ICD-10-CM | POA: Diagnosis not present

## 2019-09-12 DIAGNOSIS — I480 Paroxysmal atrial fibrillation: Secondary | ICD-10-CM | POA: Diagnosis not present

## 2019-09-12 DIAGNOSIS — I25119 Atherosclerotic heart disease of native coronary artery with unspecified angina pectoris: Secondary | ICD-10-CM | POA: Diagnosis not present

## 2019-09-12 DIAGNOSIS — I110 Hypertensive heart disease with heart failure: Secondary | ICD-10-CM | POA: Diagnosis not present

## 2019-09-15 DIAGNOSIS — E785 Hyperlipidemia, unspecified: Secondary | ICD-10-CM | POA: Diagnosis not present

## 2019-09-15 DIAGNOSIS — I5022 Chronic systolic (congestive) heart failure: Secondary | ICD-10-CM | POA: Diagnosis not present

## 2019-09-15 DIAGNOSIS — I480 Paroxysmal atrial fibrillation: Secondary | ICD-10-CM | POA: Diagnosis not present

## 2019-09-15 DIAGNOSIS — I110 Hypertensive heart disease with heart failure: Secondary | ICD-10-CM | POA: Diagnosis not present

## 2019-09-15 DIAGNOSIS — I25119 Atherosclerotic heart disease of native coronary artery with unspecified angina pectoris: Secondary | ICD-10-CM | POA: Diagnosis not present

## 2019-09-15 DIAGNOSIS — I252 Old myocardial infarction: Secondary | ICD-10-CM | POA: Diagnosis not present

## 2019-09-16 DIAGNOSIS — E785 Hyperlipidemia, unspecified: Secondary | ICD-10-CM | POA: Diagnosis not present

## 2019-09-16 DIAGNOSIS — I110 Hypertensive heart disease with heart failure: Secondary | ICD-10-CM | POA: Diagnosis not present

## 2019-09-16 DIAGNOSIS — I25119 Atherosclerotic heart disease of native coronary artery with unspecified angina pectoris: Secondary | ICD-10-CM | POA: Diagnosis not present

## 2019-09-16 DIAGNOSIS — I480 Paroxysmal atrial fibrillation: Secondary | ICD-10-CM | POA: Diagnosis not present

## 2019-09-16 DIAGNOSIS — I5022 Chronic systolic (congestive) heart failure: Secondary | ICD-10-CM | POA: Diagnosis not present

## 2019-09-16 DIAGNOSIS — I252 Old myocardial infarction: Secondary | ICD-10-CM | POA: Diagnosis not present

## 2019-09-17 DIAGNOSIS — I25119 Atherosclerotic heart disease of native coronary artery with unspecified angina pectoris: Secondary | ICD-10-CM | POA: Diagnosis not present

## 2019-09-17 DIAGNOSIS — E785 Hyperlipidemia, unspecified: Secondary | ICD-10-CM | POA: Diagnosis not present

## 2019-09-17 DIAGNOSIS — I110 Hypertensive heart disease with heart failure: Secondary | ICD-10-CM | POA: Diagnosis not present

## 2019-09-17 DIAGNOSIS — I252 Old myocardial infarction: Secondary | ICD-10-CM | POA: Diagnosis not present

## 2019-09-17 DIAGNOSIS — I5022 Chronic systolic (congestive) heart failure: Secondary | ICD-10-CM | POA: Diagnosis not present

## 2019-09-17 DIAGNOSIS — I480 Paroxysmal atrial fibrillation: Secondary | ICD-10-CM | POA: Diagnosis not present

## 2019-09-18 DIAGNOSIS — G4733 Obstructive sleep apnea (adult) (pediatric): Secondary | ICD-10-CM | POA: Diagnosis not present

## 2019-09-18 DIAGNOSIS — Z8781 Personal history of (healed) traumatic fracture: Secondary | ICD-10-CM | POA: Diagnosis not present

## 2019-09-18 DIAGNOSIS — R627 Adult failure to thrive: Secondary | ICD-10-CM | POA: Diagnosis not present

## 2019-09-18 DIAGNOSIS — I480 Paroxysmal atrial fibrillation: Secondary | ICD-10-CM | POA: Diagnosis not present

## 2019-09-18 DIAGNOSIS — Z741 Need for assistance with personal care: Secondary | ICD-10-CM | POA: Diagnosis not present

## 2019-09-18 DIAGNOSIS — E785 Hyperlipidemia, unspecified: Secondary | ICD-10-CM | POA: Diagnosis not present

## 2019-09-18 DIAGNOSIS — I5022 Chronic systolic (congestive) heart failure: Secondary | ICD-10-CM | POA: Diagnosis not present

## 2019-09-18 DIAGNOSIS — I25119 Atherosclerotic heart disease of native coronary artery with unspecified angina pectoris: Secondary | ICD-10-CM | POA: Diagnosis not present

## 2019-09-18 DIAGNOSIS — D500 Iron deficiency anemia secondary to blood loss (chronic): Secondary | ICD-10-CM | POA: Diagnosis not present

## 2019-09-18 DIAGNOSIS — I252 Old myocardial infarction: Secondary | ICD-10-CM | POA: Diagnosis not present

## 2019-09-18 DIAGNOSIS — Z681 Body mass index (BMI) 19 or less, adult: Secondary | ICD-10-CM | POA: Diagnosis not present

## 2019-09-18 DIAGNOSIS — I110 Hypertensive heart disease with heart failure: Secondary | ICD-10-CM | POA: Diagnosis not present

## 2019-09-18 DIAGNOSIS — R296 Repeated falls: Secondary | ICD-10-CM | POA: Diagnosis not present

## 2019-09-18 DIAGNOSIS — R634 Abnormal weight loss: Secondary | ICD-10-CM | POA: Diagnosis not present

## 2019-09-18 DIAGNOSIS — Z8719 Personal history of other diseases of the digestive system: Secondary | ICD-10-CM | POA: Diagnosis not present

## 2019-09-19 DIAGNOSIS — I252 Old myocardial infarction: Secondary | ICD-10-CM | POA: Diagnosis not present

## 2019-09-19 DIAGNOSIS — I25119 Atherosclerotic heart disease of native coronary artery with unspecified angina pectoris: Secondary | ICD-10-CM | POA: Diagnosis not present

## 2019-09-19 DIAGNOSIS — I110 Hypertensive heart disease with heart failure: Secondary | ICD-10-CM | POA: Diagnosis not present

## 2019-09-19 DIAGNOSIS — E785 Hyperlipidemia, unspecified: Secondary | ICD-10-CM | POA: Diagnosis not present

## 2019-09-19 DIAGNOSIS — I480 Paroxysmal atrial fibrillation: Secondary | ICD-10-CM | POA: Diagnosis not present

## 2019-09-19 DIAGNOSIS — I5022 Chronic systolic (congestive) heart failure: Secondary | ICD-10-CM | POA: Diagnosis not present

## 2019-09-22 DIAGNOSIS — I25119 Atherosclerotic heart disease of native coronary artery with unspecified angina pectoris: Secondary | ICD-10-CM | POA: Diagnosis not present

## 2019-09-22 DIAGNOSIS — I110 Hypertensive heart disease with heart failure: Secondary | ICD-10-CM | POA: Diagnosis not present

## 2019-09-22 DIAGNOSIS — I480 Paroxysmal atrial fibrillation: Secondary | ICD-10-CM | POA: Diagnosis not present

## 2019-09-22 DIAGNOSIS — I5022 Chronic systolic (congestive) heart failure: Secondary | ICD-10-CM | POA: Diagnosis not present

## 2019-09-22 DIAGNOSIS — E785 Hyperlipidemia, unspecified: Secondary | ICD-10-CM | POA: Diagnosis not present

## 2019-09-22 DIAGNOSIS — I252 Old myocardial infarction: Secondary | ICD-10-CM | POA: Diagnosis not present

## 2019-09-23 DIAGNOSIS — I110 Hypertensive heart disease with heart failure: Secondary | ICD-10-CM | POA: Diagnosis not present

## 2019-09-23 DIAGNOSIS — I25119 Atherosclerotic heart disease of native coronary artery with unspecified angina pectoris: Secondary | ICD-10-CM | POA: Diagnosis not present

## 2019-09-23 DIAGNOSIS — I5022 Chronic systolic (congestive) heart failure: Secondary | ICD-10-CM | POA: Diagnosis not present

## 2019-09-23 DIAGNOSIS — I480 Paroxysmal atrial fibrillation: Secondary | ICD-10-CM | POA: Diagnosis not present

## 2019-09-23 DIAGNOSIS — I252 Old myocardial infarction: Secondary | ICD-10-CM | POA: Diagnosis not present

## 2019-09-23 DIAGNOSIS — E785 Hyperlipidemia, unspecified: Secondary | ICD-10-CM | POA: Diagnosis not present

## 2019-09-24 DIAGNOSIS — I480 Paroxysmal atrial fibrillation: Secondary | ICD-10-CM | POA: Diagnosis not present

## 2019-09-24 DIAGNOSIS — E785 Hyperlipidemia, unspecified: Secondary | ICD-10-CM | POA: Diagnosis not present

## 2019-09-24 DIAGNOSIS — I252 Old myocardial infarction: Secondary | ICD-10-CM | POA: Diagnosis not present

## 2019-09-24 DIAGNOSIS — I25119 Atherosclerotic heart disease of native coronary artery with unspecified angina pectoris: Secondary | ICD-10-CM | POA: Diagnosis not present

## 2019-09-24 DIAGNOSIS — I110 Hypertensive heart disease with heart failure: Secondary | ICD-10-CM | POA: Diagnosis not present

## 2019-09-24 DIAGNOSIS — I5022 Chronic systolic (congestive) heart failure: Secondary | ICD-10-CM | POA: Diagnosis not present

## 2019-09-26 DIAGNOSIS — I25119 Atherosclerotic heart disease of native coronary artery with unspecified angina pectoris: Secondary | ICD-10-CM | POA: Diagnosis not present

## 2019-09-26 DIAGNOSIS — I480 Paroxysmal atrial fibrillation: Secondary | ICD-10-CM | POA: Diagnosis not present

## 2019-09-26 DIAGNOSIS — I5022 Chronic systolic (congestive) heart failure: Secondary | ICD-10-CM | POA: Diagnosis not present

## 2019-09-26 DIAGNOSIS — I110 Hypertensive heart disease with heart failure: Secondary | ICD-10-CM | POA: Diagnosis not present

## 2019-09-26 DIAGNOSIS — E785 Hyperlipidemia, unspecified: Secondary | ICD-10-CM | POA: Diagnosis not present

## 2019-09-26 DIAGNOSIS — I252 Old myocardial infarction: Secondary | ICD-10-CM | POA: Diagnosis not present

## 2019-09-30 DIAGNOSIS — E785 Hyperlipidemia, unspecified: Secondary | ICD-10-CM | POA: Diagnosis not present

## 2019-09-30 DIAGNOSIS — I5022 Chronic systolic (congestive) heart failure: Secondary | ICD-10-CM | POA: Diagnosis not present

## 2019-09-30 DIAGNOSIS — I110 Hypertensive heart disease with heart failure: Secondary | ICD-10-CM | POA: Diagnosis not present

## 2019-09-30 DIAGNOSIS — I252 Old myocardial infarction: Secondary | ICD-10-CM | POA: Diagnosis not present

## 2019-09-30 DIAGNOSIS — I480 Paroxysmal atrial fibrillation: Secondary | ICD-10-CM | POA: Diagnosis not present

## 2019-09-30 DIAGNOSIS — I25119 Atherosclerotic heart disease of native coronary artery with unspecified angina pectoris: Secondary | ICD-10-CM | POA: Diagnosis not present

## 2019-10-01 DIAGNOSIS — I252 Old myocardial infarction: Secondary | ICD-10-CM | POA: Diagnosis not present

## 2019-10-01 DIAGNOSIS — I110 Hypertensive heart disease with heart failure: Secondary | ICD-10-CM | POA: Diagnosis not present

## 2019-10-01 DIAGNOSIS — E785 Hyperlipidemia, unspecified: Secondary | ICD-10-CM | POA: Diagnosis not present

## 2019-10-01 DIAGNOSIS — I25119 Atherosclerotic heart disease of native coronary artery with unspecified angina pectoris: Secondary | ICD-10-CM | POA: Diagnosis not present

## 2019-10-01 DIAGNOSIS — I5022 Chronic systolic (congestive) heart failure: Secondary | ICD-10-CM | POA: Diagnosis not present

## 2019-10-01 DIAGNOSIS — I480 Paroxysmal atrial fibrillation: Secondary | ICD-10-CM | POA: Diagnosis not present

## 2019-10-03 DIAGNOSIS — I5022 Chronic systolic (congestive) heart failure: Secondary | ICD-10-CM | POA: Diagnosis not present

## 2019-10-03 DIAGNOSIS — I110 Hypertensive heart disease with heart failure: Secondary | ICD-10-CM | POA: Diagnosis not present

## 2019-10-03 DIAGNOSIS — I252 Old myocardial infarction: Secondary | ICD-10-CM | POA: Diagnosis not present

## 2019-10-03 DIAGNOSIS — I25119 Atherosclerotic heart disease of native coronary artery with unspecified angina pectoris: Secondary | ICD-10-CM | POA: Diagnosis not present

## 2019-10-03 DIAGNOSIS — E785 Hyperlipidemia, unspecified: Secondary | ICD-10-CM | POA: Diagnosis not present

## 2019-10-03 DIAGNOSIS — I480 Paroxysmal atrial fibrillation: Secondary | ICD-10-CM | POA: Diagnosis not present

## 2019-10-07 DIAGNOSIS — I110 Hypertensive heart disease with heart failure: Secondary | ICD-10-CM | POA: Diagnosis not present

## 2019-10-07 DIAGNOSIS — I5022 Chronic systolic (congestive) heart failure: Secondary | ICD-10-CM | POA: Diagnosis not present

## 2019-10-07 DIAGNOSIS — E785 Hyperlipidemia, unspecified: Secondary | ICD-10-CM | POA: Diagnosis not present

## 2019-10-07 DIAGNOSIS — I480 Paroxysmal atrial fibrillation: Secondary | ICD-10-CM | POA: Diagnosis not present

## 2019-10-07 DIAGNOSIS — I252 Old myocardial infarction: Secondary | ICD-10-CM | POA: Diagnosis not present

## 2019-10-07 DIAGNOSIS — I25119 Atherosclerotic heart disease of native coronary artery with unspecified angina pectoris: Secondary | ICD-10-CM | POA: Diagnosis not present

## 2019-10-08 DIAGNOSIS — I25119 Atherosclerotic heart disease of native coronary artery with unspecified angina pectoris: Secondary | ICD-10-CM | POA: Diagnosis not present

## 2019-10-08 DIAGNOSIS — I110 Hypertensive heart disease with heart failure: Secondary | ICD-10-CM | POA: Diagnosis not present

## 2019-10-08 DIAGNOSIS — I5022 Chronic systolic (congestive) heart failure: Secondary | ICD-10-CM | POA: Diagnosis not present

## 2019-10-08 DIAGNOSIS — I480 Paroxysmal atrial fibrillation: Secondary | ICD-10-CM | POA: Diagnosis not present

## 2019-10-08 DIAGNOSIS — E785 Hyperlipidemia, unspecified: Secondary | ICD-10-CM | POA: Diagnosis not present

## 2019-10-08 DIAGNOSIS — I252 Old myocardial infarction: Secondary | ICD-10-CM | POA: Diagnosis not present

## 2019-10-10 DIAGNOSIS — E785 Hyperlipidemia, unspecified: Secondary | ICD-10-CM | POA: Diagnosis not present

## 2019-10-10 DIAGNOSIS — I110 Hypertensive heart disease with heart failure: Secondary | ICD-10-CM | POA: Diagnosis not present

## 2019-10-10 DIAGNOSIS — I480 Paroxysmal atrial fibrillation: Secondary | ICD-10-CM | POA: Diagnosis not present

## 2019-10-10 DIAGNOSIS — I5022 Chronic systolic (congestive) heart failure: Secondary | ICD-10-CM | POA: Diagnosis not present

## 2019-10-10 DIAGNOSIS — I25119 Atherosclerotic heart disease of native coronary artery with unspecified angina pectoris: Secondary | ICD-10-CM | POA: Diagnosis not present

## 2019-10-10 DIAGNOSIS — I252 Old myocardial infarction: Secondary | ICD-10-CM | POA: Diagnosis not present

## 2019-10-14 DIAGNOSIS — I480 Paroxysmal atrial fibrillation: Secondary | ICD-10-CM | POA: Diagnosis not present

## 2019-10-14 DIAGNOSIS — I25119 Atherosclerotic heart disease of native coronary artery with unspecified angina pectoris: Secondary | ICD-10-CM | POA: Diagnosis not present

## 2019-10-14 DIAGNOSIS — I252 Old myocardial infarction: Secondary | ICD-10-CM | POA: Diagnosis not present

## 2019-10-14 DIAGNOSIS — I5022 Chronic systolic (congestive) heart failure: Secondary | ICD-10-CM | POA: Diagnosis not present

## 2019-10-14 DIAGNOSIS — I110 Hypertensive heart disease with heart failure: Secondary | ICD-10-CM | POA: Diagnosis not present

## 2019-10-14 DIAGNOSIS — E785 Hyperlipidemia, unspecified: Secondary | ICD-10-CM | POA: Diagnosis not present

## 2019-10-15 DIAGNOSIS — I252 Old myocardial infarction: Secondary | ICD-10-CM | POA: Diagnosis not present

## 2019-10-15 DIAGNOSIS — I480 Paroxysmal atrial fibrillation: Secondary | ICD-10-CM | POA: Diagnosis not present

## 2019-10-15 DIAGNOSIS — I5022 Chronic systolic (congestive) heart failure: Secondary | ICD-10-CM | POA: Diagnosis not present

## 2019-10-15 DIAGNOSIS — I25119 Atherosclerotic heart disease of native coronary artery with unspecified angina pectoris: Secondary | ICD-10-CM | POA: Diagnosis not present

## 2019-10-15 DIAGNOSIS — E785 Hyperlipidemia, unspecified: Secondary | ICD-10-CM | POA: Diagnosis not present

## 2019-10-15 DIAGNOSIS — I110 Hypertensive heart disease with heart failure: Secondary | ICD-10-CM | POA: Diagnosis not present

## 2019-10-17 DIAGNOSIS — I110 Hypertensive heart disease with heart failure: Secondary | ICD-10-CM | POA: Diagnosis not present

## 2019-10-17 DIAGNOSIS — I252 Old myocardial infarction: Secondary | ICD-10-CM | POA: Diagnosis not present

## 2019-10-17 DIAGNOSIS — E785 Hyperlipidemia, unspecified: Secondary | ICD-10-CM | POA: Diagnosis not present

## 2019-10-17 DIAGNOSIS — I25119 Atherosclerotic heart disease of native coronary artery with unspecified angina pectoris: Secondary | ICD-10-CM | POA: Diagnosis not present

## 2019-10-17 DIAGNOSIS — I480 Paroxysmal atrial fibrillation: Secondary | ICD-10-CM | POA: Diagnosis not present

## 2019-10-17 DIAGNOSIS — I5022 Chronic systolic (congestive) heart failure: Secondary | ICD-10-CM | POA: Diagnosis not present

## 2019-10-21 ENCOUNTER — Telehealth: Payer: Self-pay | Admitting: Adult Health Nurse Practitioner

## 2019-10-21 NOTE — Telephone Encounter (Signed)
Called patient's home number to schedule Palliative Consult, no answer - left message with reason for call along with my contact information.

## 2019-10-21 NOTE — Telephone Encounter (Signed)
Returned call to patient's daughter Romie Minus and discussed Palliative services with her and she was in agreement with this.  I have scheduled a Telephone Consult for 11/04/19 @ 9 AM.

## 2019-11-04 ENCOUNTER — Telehealth: Payer: Self-pay | Admitting: Adult Health Nurse Practitioner

## 2019-11-04 ENCOUNTER — Telehealth: Payer: Self-pay

## 2019-11-04 ENCOUNTER — Other Ambulatory Visit: Payer: Commercial Managed Care - HMO | Admitting: Adult Health Nurse Practitioner

## 2019-11-04 ENCOUNTER — Other Ambulatory Visit: Payer: Self-pay

## 2019-11-04 DIAGNOSIS — Z515 Encounter for palliative care: Secondary | ICD-10-CM

## 2019-11-04 DIAGNOSIS — F0281 Dementia in other diseases classified elsewhere with behavioral disturbance: Secondary | ICD-10-CM

## 2019-11-04 DIAGNOSIS — G309 Alzheimer's disease, unspecified: Secondary | ICD-10-CM | POA: Diagnosis not present

## 2019-11-04 NOTE — Telephone Encounter (Signed)
Dr. Alla German office called back and Dr. Silvio Pate is in agreement with changing Risperidone 0.25 mg to 1 tab in the morning and keeping the 2 tabs at night.  Instructed daughter on this change.  She expressed understanding and stated that she just had it refilled and did not need a refill right now.  Instructed to call me when she needed a refill. Amy K. Olena Heckle NP

## 2019-11-04 NOTE — Telephone Encounter (Signed)
Verbal orders given to Amy. Spoke to [place]. She expressed understanding.

## 2019-11-04 NOTE — Telephone Encounter (Signed)
Left message on verified VM for daughter.

## 2019-11-04 NOTE — Telephone Encounter (Signed)
Let her know it would be okay to increase the haloperidol as requested Let Christina [name] know she should contact me if her mom is having problems---since there is no longer the hospice RN

## 2019-11-04 NOTE — Telephone Encounter (Signed)
Unless she is agitated, I would not recommend an increase.  Please check with her daughter to find out if there is a management issue

## 2019-11-04 NOTE — Telephone Encounter (Signed)
Hanley Ben NP with [facility] Palliative Team left v/m that she had telephone visit this morning and pts daughter said pt having increased confusion throughout the day. Pt takes risperdal 0.25 mg taking 2 tabs at hs.Amy wants to know if Dr Silvio Pate would be OK to increase by pt taking risperdal 0.25 mg taking 2 tabs in the morning and 2 tabs at hs. Hanley Ben NP request cb.

## 2019-11-04 NOTE — Progress Notes (Addendum)
Therapist, nutritional Palliative Care Consult Note Telephone: [phone]  Fax: [phone]  PATIENT NAME: Christina [name] DOB: October 01, 1920 MRN: [id number]  PRIMARY CARE PROVIDER:   Karie Schwalbe, MD  REFERRING PROVIDER:  Karie Schwalbe, MD [address] [place],  [place] [address]  RESPONSIBLE PARTY:   Rubbie Battiest, daughter [phone]  Due to the COVID-19 crisis, this visit was done via telemedicine and it was initiated and consent by this patient and or family. Video-audio (telehealth) contact was unable to be done due to technical barriers from the patient's side.    RECOMMENDATIONS and PLAN:  1.  Advanced care planning.  Patient is a DNR.  Patient used to be under hospice services and was discharged due to stability.    2.  Dementia.  FAST 7b. A&O to person only.  Requires assistance with all ADLs except eating.  Not able to ambulate but tries to get up and walk.  Has had 2 falls since June.  Usually falls at night when she tries to get up.  Is verbal.  Has auditory and visual hallucinations.  Talks with people that others don't see and will hear music others don't hear.  Is on risperidone 0.25 mg QHS.  Daughter states that her agitation has improved with this but she still gets agitated throughout the day. Her sleep has improved some with the risperidone but she will still have nights when she doesn't sleep well and tries to get up a lot.  Has tried melatonin with no effect. Daughter states that at times she has given her Benadryl to help her sleep.  Have cautioned against using Benadryl at her mom's age and educated on the side effects. Have reached out to Dr. Karle Starch office to see if he would agree to adding one tab of risperidone in the morning to see if that helps with the agitation.    3.  CHF/CAD. Daughter states that when she was under hospice services a lot of her medications were stopped and she has done well without them.  Denies any increased  SOB, cough, edema.  Will continue to monitor for any increasing cardiac symptoms.  4.  Nutrition.  Patient had lost down to 87 pounds.  Daughter states that prior baseline was 115-120 pounds before the weight loss.  Her appetite has improved and she is now up to 95 pounds.  Her food has to be chopped for easier pick up and swallowing.  Continue giving foods that are easy to pick up and swallow.  Continue to monitor for swallowing difficulties and any difficulty using utensils that would require someone to start feeding her.  Patient has not had a fall with injury, infection, or recent hospital visit.  Agitation is improving with the risperidone but could use more consistent control.  Will continue to monitor for effectiveness of increased risperidone.  Have telephone visit in one week.   I spent 50 minutes providing this consultation,  from 9:00 to 9:50. More than 50% of the time in this consultation was spent coordinating communication.   HISTORY OF PRESENT ILLNESS:  JALISA SACCO is a 85 y.o. year old female with multiple medical problems including Alzheimer's and vascular dementia, protein calorie malnutrition, CHF, CAD. Palliative Care was asked to help address goals of care. Daughter does have a caregiver who alternates weeks with her in her mother's care.  CODE STATUS: DNR  PPS: 50% HOSPICE ELIGIBILITY/DIAGNOSIS: TBD  PHYSICAL EXAM:   Deferred  PAST  MEDICAL HISTORY:  Past Medical History:  Diagnosis Date  . CAD (coronary artery disease)    a. S/P previous MI;  b. 04/2007 Cath/PCI: Taxus DES' to mid/distal RCA and RPDA as well as Ramus;  c. myoview 8/09: EF 78%, normal perfusion; d. 12/2012 Low risk MV; e. 08/2017 NSTEMI->Med Rx.  . Carotid stenosis    Bilateral, mild to moderate  . Chronic back pain   . Chronic shoulder pain   . HFrEF (heart failure with reduced ejection fraction) ([place])    a. 08/2017 Echo: EF 20-25%, mid-apicalanteroseptal, ant, antlat, and apical sev HK. Mild MR,  mildly dil LA, nl RV fxn.  . Hyperlipidemia    Intolerant of many statins  . Hypertension   . Ischemic cardiomyopathy    a. 08/2017 Echo: EF 20-25%, mid-apicalanteroseptal, ant, antlat, and apical sev HK.  . Mild mitral and aortic regurgitation    a. 11/2011 Echo: EF 55-65%, No RWMA, Gr 1 DD, Mild AI/MR; b. 08/2017 Echo: Mild MR.  Marland Kitchen Neuropathy   . Osteoarthritis   . Osteoporosis   . PAF (paroxysmal atrial fibrillation) ([place])    a. 08/2017 - noted @ time of NSTEMI. CHA2DS2VASc = 5-->ASA only due to age and h/o falls;  c. On Amio.  Marland Kitchen Pelvic fracture ([facility]) 6/16  . Peripheral vascular disease ([place])     SOCIAL HX:  Social History   Tobacco Use  . Smoking status: Never Smoker  . Smokeless tobacco: Never Used  Substance Use Topics  . Alcohol use: No    ALLERGIES:  Allergies  Allergen Reactions  . Bee Venom Rash  . Plavix [Clopidogrel] Rash  . Atorvastatin Other (See Comments)    Reaction:  Blisters on feet   . Diltiazem Hcl Other (See Comments)    Reaction:  Unknown   . Dye Fdc Red [Red Dye] Other (See Comments)    Reaction:  Unknown   . Gemfibrozil Other (See Comments)    Reaction:  Unknown   . Niacin Other (See Comments)    Reaction:  Unknown   . Sulfasalazine Rash     PERTINENT MEDICATIONS:  Outpatient Encounter Medications as of 11/04/2019  Medication Sig  . acetaminophen (TYLENOL 8 HOUR) 650 MG CR tablet Take 650-1,300 mg by mouth every 8 (eight) hours as needed for pain. Do not exceed 3000 mg / 24 hrs of acetaminophen. Consider all sources  . Morphine Sulfate (MORPHINE CONCENTRATE) 10 mg / 0.5 ml concentrated solution Take 0.25-0.5 mLs (5-10 mg total) by mouth every 3 (three) hours as needed for severe pain or shortness of breath.  . nitroGLYCERIN (NITROSTAT) 0.4 MG SL tablet PLACE 1 TABLET (0.4 MG TOTAL) UNDER THE TONGUE EVERY 5 (FIVE) MINUTES AS NEEDED FOR CHEST PAIN.  Marland Kitchen risperiDONE (RISPERDAL) 0.5 MG tablet Take 0.5 mg by mouth at bedtime.  . traMADol (ULTRAM) 50 MG  tablet TAKE 0.5-1 TABLETS (25-50 MG TOTAL) BY MOUTH 3 (THREE) TIMES DAILY AS NEEDED. (Patient taking differently: Take 25-50 mg by mouth 3 (three) times daily as needed for moderate pain. )   No facility-administered encounter medications on file as of 11/04/2019.       Mable Lashley Jenetta Downer, NP

## 2019-11-04 NOTE — Telephone Encounter (Signed)
Patient's daughter returned phone call. She states that patient is confused, as well as very agitated. I have also left a message for Hanley Ben NP with [facility] regarding this also.

## 2019-11-04 NOTE — Telephone Encounter (Signed)
Called for scheduled telephone visit.  No answer.  Left VM with reason for call and contact info. Daryel Kenneth K. Olena Heckle NP

## 2019-11-11 ENCOUNTER — Other Ambulatory Visit: Payer: Self-pay

## 2019-11-11 ENCOUNTER — Telehealth: Payer: Self-pay

## 2019-11-11 ENCOUNTER — Other Ambulatory Visit: Payer: Commercial Managed Care - HMO | Admitting: Adult Health Nurse Practitioner

## 2019-11-11 DIAGNOSIS — G309 Alzheimer's disease, unspecified: Secondary | ICD-10-CM | POA: Diagnosis not present

## 2019-11-11 DIAGNOSIS — Z515 Encounter for palliative care: Secondary | ICD-10-CM | POA: Diagnosis not present

## 2019-11-11 DIAGNOSIS — F0281 Dementia in other diseases classified elsewhere with behavioral disturbance: Secondary | ICD-10-CM

## 2019-11-11 NOTE — Telephone Encounter (Signed)
Best number [phone] Hanley Ben NP @ palliative care calling wanting to get referral for hospice.  Pt is unresponsive. Pt is pale starting to wake up a little bit

## 2019-11-11 NOTE — Telephone Encounter (Signed)
RN received phone call from Dr. Alla German office (Rena at [phone]) reporting that patient's family had called them and patient has had vomiting and is now unresponsive.  RN suggested that family call [phone] for emergency need, Rena advised, that family did not want her taken to the hospital.  RN sent message amd left voicemail for NP, Amy Olena Heckle that patient is in need of visit/phone call to follow up.  RN contacted daughter who reports patient went to bathroom, had bowel movement and sthen started vomiting and went unresponsive.  Her extremities are cool and she has very shallow breathing. RN discussed that patient has likely had a significant medical event and verified that family does not want to call 911 and patient has a DNR. Daughter is also HCPOA. She has contacted family and has some there with her for support.

## 2019-11-11 NOTE — Telephone Encounter (Signed)
Called their home AMy there now--order given for hospice again They do have roxanol in the house Spoke to son Rush Landmark and daughter Romie Minus also [place] my cell number in case they need to contact me

## 2019-11-11 NOTE — Progress Notes (Signed)
[facility] Consult Note Telephone: [phone]  Fax: [phone]  PATIENT NAME: Christina [name] DOB: [birth year] MRN: [id number]  PRIMARY CARE PROVIDER:   Venia Carbon, MD  REFERRING PROVIDER:  Venia Carbon, MD [place],  [place] [address]  RESPONSIBLE PARTY:   [place] Cellar, daughter [phone]     RECOMMENDATIONS and PLAN:  1.  Advanced care planning.  Patient is a DNR.  First had a phone visit in the morning at 9am.  Everything was stable with the patient except needed a refill on tramadol.   Daughter called the office later stating the patient had an episode.  Went to patient's home around 12:45.  Son and daughter with the patient.   Son states that she had a bowel movement and upon coming back into the living area she complained that her belly hurt and then her chest hurt.  She vomited and then became unresponsive.  Upon arrival the patient is lying on the couch in NAD.  She has her mouth open and looks pale. She is tachypnic but is not is not in respiratory distress.   Her hands are somewhat cold and unable to get an oxygen reading.  Lungs fields clear.  Heart with normal rate and rhythm.  Pedal and radial pulses are normal.  Feet were warm upon arrival but have started to get colder later in the visit.  She is mostly unresponsive but at times will rouse but only for a few seconds to a minute before she falls back to sleep.   Believe that patient may have had a MI or possibly a stroke.  The family and PCP do not want to send her to the ER.  At 85 yo I agree with the family's wishes to make her comfortable.  Was able to speak with Dr. Silvio Pate on the phone and received verbal order for hospice referral. Called hospice referral office with verbal order from Dr. Silvio Pate. Family still had medication from comfort kit when patient was previously on hospice.  Went over some of the medications to give if she became  uncomfortable.  I spent 90 minutes providing this consultation. More than 50% of the time in this consultation was spent coordinating communication.   HISTORY OF PRESENT ILLNESS:  Christina [name] is a 85 y.o. year old female with multiple medical problems including Alzheimer's and vascular dementia, protein calorie malnutrition, CHF, CAD. Palliative Care was asked to help address goals of care.   CODE STATUS: DNR  PPS: 30% HOSPICE ELIGIBILITY/DIAGNOSIS: yes/dementia and cardiac issues.  PHYSICAL EXAM:   General: NAD, frail appearing, thin Cardiovascular: regular rate and rhythm Pulmonary: clear ant fields; tachypnic Abdomen: soft, nontender, + bowel sounds GU: no suprapubic tenderness Extremities: no edema, no joint deformities Skin: no rashes Neurological: unreponsive;  Will rouse from time to time but only for a minute; does not answer questions.   PAST MEDICAL HISTORY:  Past Medical History:  Diagnosis Date   CAD (coronary artery disease)    a. S/P previous MI;  b. 04/2007 Cath/PCI: Taxus DES' to mid/distal RCA and RPDA as well as Ramus;  c. myoview 8/09: EF 78%, normal perfusion; d. 12/2012 Low risk MV; e. 08/2017 NSTEMI->Med Rx.   Carotid stenosis    Bilateral, mild to moderate   Chronic back pain    Chronic shoulder pain    HFrEF (heart failure with reduced ejection fraction) ([facility])    a. 08/2017 Echo: EF 20-25%,  mid-apicalanteroseptal, ant, antlat, and apical sev HK. Mild MR, mildly dil LA, nl RV fxn.   Hyperlipidemia    Intolerant of many statins   Hypertension    Ischemic cardiomyopathy    a. 08/2017 Echo: EF 20-25%, mid-apicalanteroseptal, ant, antlat, and apical sev HK.   Mild mitral and aortic regurgitation    a. 11/2011 Echo: EF 55-65%, No RWMA, Gr 1 DD, Mild AI/MR; b. 08/2017 Echo: Mild MR.   Neuropathy    Osteoarthritis    Osteoporosis    PAF (paroxysmal atrial fibrillation) ([facility])    a. 08/2017 - noted @ time of NSTEMI. CHA2DS2VASc = 5-->ASA only due to  age and h/o falls;  c. On Amio.   Pelvic fracture ([facility]) 6/16   Peripheral vascular disease ([facility])     SOCIAL HX:  Social History   Tobacco Use   Smoking status: Never Smoker   Smokeless tobacco: Never Used  Substance Use Topics   Alcohol use: No    ALLERGIES:  Allergies  Allergen Reactions   Bee Venom Rash   Plavix [Clopidogrel] Rash   Atorvastatin Other (See Comments)    Reaction:  Blisters on feet    Diltiazem Hcl Other (See Comments)    Reaction:  Unknown    Dye Fdc Red [Red Dye] Other (See Comments)    Reaction:  Unknown    Gemfibrozil Other (See Comments)    Reaction:  Unknown    Niacin Other (See Comments)    Reaction:  Unknown    Sulfasalazine Rash     PERTINENT MEDICATIONS:  Outpatient Encounter Medications as of 11/11/2019  Medication Sig   acetaminophen (TYLENOL 8 HOUR) 650 MG CR tablet Take 650-1,300 mg by mouth every 8 (eight) hours as needed for pain. Do not exceed 3000 mg / 24 hrs of acetaminophen. Consider all sources   Morphine Sulfate (MORPHINE CONCENTRATE) 10 mg / 0.5 ml concentrated solution Take 0.25-0.5 mLs (5-10 mg total) by mouth every 3 (three) hours as needed for severe pain or shortness of breath.   nitroGLYCERIN (NITROSTAT) 0.4 MG SL tablet PLACE 1 TABLET (0.4 MG TOTAL) UNDER THE TONGUE EVERY 5 (FIVE) MINUTES AS NEEDED FOR CHEST PAIN.   risperiDONE (RISPERDAL) 0.5 MG tablet Take 0.5 mg by mouth at bedtime.   traMADol (ULTRAM) 50 MG tablet TAKE 0.5-1 TABLETS (25-50 MG TOTAL) BY MOUTH 3 (THREE) TIMES DAILY AS NEEDED. (Patient taking differently: Take 25-50 mg by mouth 3 (three) times daily as needed for moderate pain. )   No facility-administered encounter medications on file as of 11/11/2019.      Ashlyn Cabler Jenetta Downer, NP

## 2019-11-11 NOTE — Telephone Encounter (Signed)
Christina [name] pts son said pt was talking and then complained with CP, pt vomited and then became unresponsive. Pt has been unresponsive for 20 '; pt is breathing and sikin feels damp.. I called [facility] and spoke with Malachy Mood who will try to get in touch with Hanley Ben NP in palliative care and see if she can go to see pt. Dr Silvio Pate will call family back. Christina [name] voiced understanding.

## 2019-11-18 DEATH — deceased
